# Patient Record
Sex: Male | Born: 1945 | Race: White | Hispanic: No | Marital: Married | Smoking: Former smoker
Health system: Southern US, Community
[De-identification: ages and names within clinical notes are randomized; demographics above are authoritative.]

## PROBLEM LIST (undated history)

## (undated) DIAGNOSIS — F32A Depression, unspecified: Secondary | ICD-10-CM

## (undated) DIAGNOSIS — K219 Gastro-esophageal reflux disease without esophagitis: Secondary | ICD-10-CM

## (undated) DIAGNOSIS — F419 Anxiety disorder, unspecified: Secondary | ICD-10-CM

## (undated) DIAGNOSIS — Z8719 Personal history of other diseases of the digestive system: Secondary | ICD-10-CM

## (undated) DIAGNOSIS — R011 Cardiac murmur, unspecified: Secondary | ICD-10-CM

## (undated) DIAGNOSIS — R0600 Dyspnea, unspecified: Secondary | ICD-10-CM

## (undated) DIAGNOSIS — K5190 Ulcerative colitis, unspecified, without complications: Secondary | ICD-10-CM

## (undated) DIAGNOSIS — M1990 Unspecified osteoarthritis, unspecified site: Secondary | ICD-10-CM

## (undated) DIAGNOSIS — F329 Major depressive disorder, single episode, unspecified: Secondary | ICD-10-CM

## (undated) DIAGNOSIS — N183 Chronic kidney disease, stage 3 unspecified: Secondary | ICD-10-CM

## (undated) DIAGNOSIS — C801 Malignant (primary) neoplasm, unspecified: Secondary | ICD-10-CM

## (undated) DIAGNOSIS — K449 Diaphragmatic hernia without obstruction or gangrene: Secondary | ICD-10-CM

## (undated) DIAGNOSIS — I10 Essential (primary) hypertension: Secondary | ICD-10-CM

## (undated) DIAGNOSIS — J189 Pneumonia, unspecified organism: Secondary | ICD-10-CM

## (undated) HISTORY — PX: OTHER SURGICAL HISTORY: SHX169

## (undated) HISTORY — PX: JOINT REPLACEMENT: SHX530

## (undated) HISTORY — DX: Essential (primary) hypertension: I10

## (undated) HISTORY — DX: Diaphragmatic hernia without obstruction or gangrene: K44.9

## (undated) HISTORY — DX: Ulcerative colitis, unspecified, without complications: K51.90

## (undated) HISTORY — DX: Chronic kidney disease, stage 3 unspecified: N18.30

## (undated) HISTORY — DX: Gastro-esophageal reflux disease without esophagitis: K21.9

---

## 1977-05-12 HISTORY — PX: KNEE ARTHROSCOPY: SUR90

## 1981-05-12 HISTORY — PX: ELBOW BURSA SURGERY: SHX615

## 2014-06-15 ENCOUNTER — Encounter (INDEPENDENT_AMBULATORY_CARE_PROVIDER_SITE_OTHER): Payer: Self-pay | Admitting: *Deleted

## 2014-07-17 ENCOUNTER — Encounter (INDEPENDENT_AMBULATORY_CARE_PROVIDER_SITE_OTHER): Payer: Self-pay | Admitting: Internal Medicine

## 2014-07-17 ENCOUNTER — Ambulatory Visit (INDEPENDENT_AMBULATORY_CARE_PROVIDER_SITE_OTHER): Payer: Medicare Other | Admitting: Internal Medicine

## 2014-07-17 VITALS — BP 138/68 | HR 66 | Temp 98.1°F | Ht 66.0 in | Wt 208.5 lb

## 2014-07-17 DIAGNOSIS — K51919 Ulcerative colitis, unspecified with unspecified complications: Secondary | ICD-10-CM

## 2014-07-17 DIAGNOSIS — K51019 Ulcerative (chronic) pancolitis with unspecified complications: Secondary | ICD-10-CM | POA: Insufficient documentation

## 2014-07-17 DIAGNOSIS — I10 Essential (primary) hypertension: Secondary | ICD-10-CM | POA: Diagnosis not present

## 2014-07-17 DIAGNOSIS — K5100 Ulcerative (chronic) pancolitis without complications: Secondary | ICD-10-CM | POA: Insufficient documentation

## 2014-07-17 DIAGNOSIS — K5190 Ulcerative colitis, unspecified, without complications: Secondary | ICD-10-CM | POA: Insufficient documentation

## 2014-07-17 LAB — CBC WITH DIFFERENTIAL/PLATELET
BASOS ABS: 0 10*3/uL (ref 0.0–0.1)
BASOS PCT: 0 % (ref 0–1)
EOS PCT: 1 % (ref 0–5)
Eosinophils Absolute: 0.1 10*3/uL (ref 0.0–0.7)
HEMATOCRIT: 43.8 % (ref 39.0–52.0)
Hemoglobin: 14.9 g/dL (ref 13.0–17.0)
LYMPHS PCT: 17 % (ref 12–46)
Lymphs Abs: 1.9 10*3/uL (ref 0.7–4.0)
MCH: 28.1 pg (ref 26.0–34.0)
MCHC: 34 g/dL (ref 30.0–36.0)
MCV: 82.6 fL (ref 78.0–100.0)
MPV: 9.8 fL (ref 8.6–12.4)
Monocytes Absolute: 1.1 10*3/uL — ABNORMAL HIGH (ref 0.1–1.0)
Monocytes Relative: 10 % (ref 3–12)
NEUTROS ABS: 7.8 10*3/uL — AB (ref 1.7–7.7)
Neutrophils Relative %: 72 % (ref 43–77)
PLATELETS: 410 10*3/uL — AB (ref 150–400)
RBC: 5.3 MIL/uL (ref 4.22–5.81)
RDW: 15 % (ref 11.5–15.5)
WBC: 10.9 10*3/uL — AB (ref 4.0–10.5)

## 2014-07-17 LAB — COMPREHENSIVE METABOLIC PANEL
ALT: 15 U/L (ref 0–53)
AST: 13 U/L (ref 0–37)
Albumin: 3.9 g/dL (ref 3.5–5.2)
Alkaline Phosphatase: 65 U/L (ref 39–117)
BUN: 17 mg/dL (ref 6–23)
CALCIUM: 9.3 mg/dL (ref 8.4–10.5)
CHLORIDE: 105 meq/L (ref 96–112)
CO2: 24 mEq/L (ref 19–32)
Creat: 1.17 mg/dL (ref 0.50–1.35)
Glucose, Bld: 93 mg/dL (ref 70–99)
POTASSIUM: 3.9 meq/L (ref 3.5–5.3)
SODIUM: 138 meq/L (ref 135–145)
TOTAL PROTEIN: 6.6 g/dL (ref 6.0–8.3)
Total Bilirubin: 0.4 mg/dL (ref 0.2–1.2)

## 2014-07-17 LAB — C-REACTIVE PROTEIN

## 2014-07-17 NOTE — Progress Notes (Signed)
Subjective:    Patient ID: Herbert Morrison, male    DOB: 1946/01/27, 69 y.o.   MRN: [id number]  HPI  Referred to our office by [facility] for UC. Diagnosed in 1995 by Dr. West Carbo.  Last sigmoid was in 2013 with severe active disease. He tells me today he is doing well. He has a BM about three times a day. There is no rectal bleeding. Stools are formed. There is no abdominal pain.  Has been on Uceris for about a year. He has been on multiple other UC medications which did not put him in remission.  Has been on Remicade in past and apparently had a reaction (choking).  Acid reflux controlled with Omeprazole.  No rashes.     07/17/2011 Gastroscopy/Colon/Sig:  Severe chronic active colitis of sigmoid colon consistent with Inflammatory bowel disease. Inflammatory polyp of sigmoid colon. Dysplasia is not seen.  08/03/2009 EGD: erosions, stricture. Dilated with a 62 Malone y bougie. Esopheal reflux.  10/13/2008: Colonoscopy: Follow up UC diagnosed in 1995. Active colitis from rectum to splenic flexure.  The transverse, ascend, and cecum without mucosal abnormalities. Biopsy: Chronic active colitis consistent with inflammatory bowel disease, descending colon, sigmoid colon, and rectal mucosa.  Colonoscopy 01/14/2001: Indications: UC, surveillance. Mild disease of the rectosigmoid ara. Retroflexion revealed normal hemorrhoidal tissue. Moderate diverticulosis of the sigmoid and descending colon are. The descending, transverse, and ascending colon and cecum with patching mild disease. Cecum well visualized.  Review of Systems Past Medical History  Diagnosis Date  . Hypertension   . 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Past Surgical History  Procedure Laterality Date  . Left elbow      spur removed  . Left knee surgery      athroscopy  . Rt knee arthroscopy      Allergies  Allergen Reactions  . Erythromycin     nausea    No current outpatient prescriptions on  file prior to visit.   No current facility-administered medications on file prior to visit.   Outpatient Encounter Prescriptions as of 07/17/2014  Medication Sig  . amLODipine (NORVASC) 10 MG tablet Take 10 mg by mouth daily.  . Budesonide 9 MG TB24 Take by mouth.  . cetirizine (ZYRTEC) 10 MG tablet Take 10 mg by mouth daily.  Marland Kitchen co-enzyme Q-10 30 MG capsule Take 30 mg by mouth 3 (three) times daily.  Marland Kitchen escitalopram (LEXAPRO) 10 MG tablet Take 15 mg by mouth daily.  . metoprolol tartrate (LOPRESSOR) 25 MG tablet Take 25 mg by mouth 2 (two) times daily.  . naproxen sodium (ANAPROX) 220 MG tablet Take 220 mg by mouth 2 (two) times daily with a meal.  . omeprazole (PRILOSEC) 20 MG capsule Take 20 mg by mouth daily.  Married, Two children in good health. Retired Pharmacist, hospital, and is now a Geophysicist/field seismologist.       Objective:   Physical Exam Blood pressure 138/68, pulse 66, temperature 98.1 F (36.7 C), height 5' 6"  (1.676 m), weight 208 lb 8 oz (94.575 kg).  Alert and oriented. Skin warm and dry. Oral mucosa is moist.   . Sclera anicteric, conjunctivae is pink. Thyroid not enlarged. No cervical lymphadenopathy. Lungs clear. Heart regular rate and rhythm.  Abdomen is soft. Bowel sounds are positive. No hepatomegaly. No abdominal masses felt. No tenderness.  No edema to lower extremities.  No rashes       Assessment & Plan:  UC. Hopefully he is in remission. No GI complaints.  Has been on Uceris for about a year. Will need an OV in 6 months with Dr. Laural Golden. May consider colonoscopy at that [place] for surveillance. CBC, CMET, and CRP today.

## 2014-07-17 NOTE — Patient Instructions (Addendum)
CBC, CMET, CRP. OV in 6 months with Dr. Laural Golden.

## 2014-08-02 ENCOUNTER — Encounter (INDEPENDENT_AMBULATORY_CARE_PROVIDER_SITE_OTHER): Payer: Self-pay

## 2014-09-29 ENCOUNTER — Encounter (INDEPENDENT_AMBULATORY_CARE_PROVIDER_SITE_OTHER): Payer: Self-pay

## 2015-01-23 ENCOUNTER — Ambulatory Visit (INDEPENDENT_AMBULATORY_CARE_PROVIDER_SITE_OTHER): Payer: Medicare Other | Admitting: Internal Medicine

## 2015-01-23 ENCOUNTER — Encounter (INDEPENDENT_AMBULATORY_CARE_PROVIDER_SITE_OTHER): Payer: Self-pay | Admitting: Internal Medicine

## 2015-01-23 VITALS — BP 140/84 | HR 66 | Temp 98.6°F | Resp 18 | Ht 66.0 in | Wt 203.7 lb

## 2015-01-23 DIAGNOSIS — K51919 Ulcerative colitis, unspecified with unspecified complications: Secondary | ICD-10-CM

## 2015-01-23 MED ORDER — MESALAMINE ER 0.375 G PO CP24: 1500.0000 mg | ORAL_CAPSULE | Freq: Every day | ORAL | Status: DC

## 2015-01-23 NOTE — Progress Notes (Signed)
Presenting complaint;  Follow-up for ulcerative colitis.  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 He was last seen on 07/17/2014. He states he's been off Uceris for about 3 months. His symptoms have not relapse. He denies abdominal pain or rectal bleeding. He has 3-4 stools per day. His stools are firm. He had blood work by PCP 3 or 4 months ago and was normal. In the past he has been on prednisone as well as his a call with he believes he only took it for 1 month and he did not work. He has not been on any other agent. He states he is not stressed anymore. He was looking after his mother with multiple problems including dementia and finally passed away at age 82. 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 He does not take OTC NSAIDs. Since he said adjustment to his back and knees he is not having pain anymore. Last colonoscopy was in March 2013 by Dr. Barnabas Lister Spanhour revealing active disease to splenic flexure.    Current Medications: Outpatient Encounter Prescriptions as of 01/23/2015  Medication Sig  . amLODipine (NORVASC) 10 MG tablet Take 10 mg by mouth daily.  . Budesonide 9 MG TB24 Take by mouth.  . cetirizine (ZYRTEC) 10 MG tablet Take 10 mg by mouth daily.  . chlorthalidone (HYGROTON) 25 MG tablet Take 25 mg by mouth daily. Patient takes 1/2 tablet daily.  Marland Kitchen escitalopram (LEXAPRO) 10 MG tablet Take 15 mg by mouth daily.  . fluticasone (FLONASE) 50 MCG/ACT nasal spray Place 1 spray into both nostrils daily.  . metoprolol tartrate (LOPRESSOR) 25 MG tablet Take 25 mg by mouth 2 (two) times daily.  . naproxen sodium (ANAPROX) 220 MG tablet Take 220 mg by mouth 2 (two) times daily with a meal.  . omeprazole (PRILOSEC) 20 MG capsule Take 20 mg by mouth daily.  . valsartan (DIOVAN) 320 MG tablet Take 320 mg by mouth daily.   . [DISCONTINUED] co-enzyme Q-10 30 MG capsule Take 30 mg by  mouth 3 (three) times daily.   No facility-administered encounter medications on file as of 01/23/2015.     Objective: Blood pressure 140/84, pulse 66, temperature 98.6 F (37 C), temperature source Oral, resp. rate 18, height 5' 6"  (1.676 m), weight 203 lb 11.2 oz (92.398 kg).  Patient is alert and in no acute distress. Conjunctiva is pink. Sclera is nonicteric Oropharyngeal mucosa is normal. No neck masses or thyromegaly noted. Cardiac exam with regular rhythm normal S1 and S2. No murmur or gallop noted. Lungs are clear to auscultation. Abdomen is soft and nontender without organomegaly or masses. No LE edema or clubbing noted.  Labs/studies Results: Lab data from 07/17/2014  WBC 10.9, H&H 14.9 and 43.8 and platelet count 410 K  CRP was less than 0.5    Assessment:  #1. Distal ulcerative colitis. He appears to be in remission based on his symptoms. Since he is at fairly active disease since onset in 1995 he would be better off on maintenance therapy with mesalamine which might work and he will have to take it for at least 3-6 months.   Plan:  Begin Apriso at 1500 mgs po qd. Patient will call if he has side effect with oral mesalamine or if rectal bleeding recurs. Office visit in 6 months.

## 2015-01-23 NOTE — Patient Instructions (Signed)
Call if you experience any side effects with Apriso

## 2015-01-26 ENCOUNTER — Telehealth (INDEPENDENT_AMBULATORY_CARE_PROVIDER_SITE_OTHER): Payer: Self-pay | Admitting: *Deleted

## 2015-01-26 NOTE — Telephone Encounter (Signed)
Herbert Morrison said he was started on Apriso and it doesn't work. It made him very ill. His return phone number is [phone].

## 2015-01-30 NOTE — Telephone Encounter (Signed)
Says he became nauseated.Stopped. After stopping, he felt better.

## 2015-01-30 NOTE — Telephone Encounter (Signed)
He will start the Cerise if he has a flare.

## 2015-01-30 NOTE — Telephone Encounter (Signed)
I have sent coupons for Uceris to patient.

## 2015-07-24 ENCOUNTER — Encounter (INDEPENDENT_AMBULATORY_CARE_PROVIDER_SITE_OTHER): Payer: Self-pay | Admitting: Internal Medicine

## 2015-07-24 ENCOUNTER — Ambulatory Visit (INDEPENDENT_AMBULATORY_CARE_PROVIDER_SITE_OTHER): Payer: Medicare Other | Admitting: Internal Medicine

## 2015-07-24 VITALS — BP 162/80 | HR 64 | Temp 97.8°F | Ht 66.5 in | Wt 208.5 lb

## 2015-07-24 DIAGNOSIS — K5120 Ulcerative (chronic) proctitis without complications: Secondary | ICD-10-CM | POA: Diagnosis not present

## 2015-07-24 LAB — CBC WITH DIFFERENTIAL/PLATELET
BASOS ABS: 0.1 10*3/uL (ref 0.0–0.1)
BASOS PCT: 1 % (ref 0–1)
Eosinophils Absolute: 0.3 10*3/uL (ref 0.0–0.7)
Eosinophils Relative: 5 % (ref 0–5)
HEMATOCRIT: 45.1 % (ref 39.0–52.0)
HEMOGLOBIN: 15.2 g/dL (ref 13.0–17.0)
LYMPHS PCT: 24 % (ref 12–46)
Lymphs Abs: 1.6 10*3/uL (ref 0.7–4.0)
MCH: 27.6 pg (ref 26.0–34.0)
MCHC: 33.7 g/dL (ref 30.0–36.0)
MCV: 82 fL (ref 78.0–100.0)
MPV: 9.3 fL (ref 8.6–12.4)
Monocytes Absolute: 0.6 10*3/uL (ref 0.1–1.0)
Monocytes Relative: 9 % (ref 3–12)
NEUTROS ABS: 4.1 10*3/uL (ref 1.7–7.7)
Neutrophils Relative %: 61 % (ref 43–77)
Platelets: 374 10*3/uL (ref 150–400)
RBC: 5.5 MIL/uL (ref 4.22–5.81)
RDW: 15.2 % (ref 11.5–15.5)
WBC: 6.8 10*3/uL (ref 4.0–10.5)

## 2015-07-24 LAB — C-REACTIVE PROTEIN: CRP: 0.6 mg/dL — AB (ref <0.60)

## 2015-07-24 NOTE — Progress Notes (Signed)
   Subjective:    Patient ID: Herbert Morrison, male    DOB: [birth year], 70 y.o.   MRN: [id number]  HPI Here today for f/u of his UC. He was last seen by Dr. Laural Golden in September. He tells me he is doing good. Off UC medication at present.  He tells me the Uceris is 900.00 dollars a month and he cannot afford it. He says it works. He is having 3-4 BMs a day. Appetite is good. He has gained 5 pounds since his last visit. No abdominal pain. No dysphagia. Acid reflux is controlled with BID Prilosec. He has no GI complaints    . Last colonoscopy was in March 2013 by Dr. Barnabas Lister Spanhour revealing active disease to splenic flexure.       Review of Systems Past Medical History  Diagnosis Date  . Hypertension   . 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[facility])     Past Surgical History  Procedure Laterality Date  . Left elbow      spur removed  . Left knee surgery      athroscopy  . Rt knee arthroscopy      Allergies  Allergen Reactions  . Erythromycin     nausea    Current Outpatient Prescriptions on File Prior to Visit  Medication Sig Dispense Refill  . amLODipine (NORVASC) 10 MG tablet Take 10 mg by mouth daily.    . cetirizine (ZYRTEC) 10 MG tablet Take 10 mg by mouth daily.    Marland Kitchen escitalopram (LEXAPRO) 10 MG tablet Take 15 mg by mouth daily.    . fluticasone (FLONASE) 50 MCG/ACT nasal spray Place 1 spray into both nostrils daily.    . metoprolol tartrate (LOPRESSOR) 25 MG tablet Take 25 mg by mouth 2 (two) times daily.    . naproxen sodium (ANAPROX) 220 MG tablet Take 220 mg by mouth 2 (two) times daily with a meal.    . omeprazole (PRILOSEC) 20 MG capsule Take 20 mg by mouth 2 (two) times daily before a meal.     . valsartan (DIOVAN) 320 MG tablet Take 320 mg by mouth daily.     . Budesonide 9 MG TB24 Take by mouth. Reported on 07/24/2015     No current facility-administered medications on file prior to visit.        Objective:   Physical ExamBlood pressure 162/80, pulse 64, temperature  97.8 F (36.6 C), height 5' 6.5" (1.689 m), weight 208 lb 8 oz (94.575 kg). Alert and oriented. Skin warm and dry. Oral mucosa is moist.   . Sclera anicteric, conjunctivae is pink. Thyroid not enlarged. No cervical lymphadenopathy. Lungs clear. Heart regular rate and rhythm.  Abdomen is soft. Bowel sounds are positive. No hepatomegaly. No abdominal masses felt. No tenderness.  No edema to lower extremities.          Assessment & Plan:  UC. He seems to be in remission;. He is doing well. No GI complaints. He is not taking Uceris due to the expense. He will call me if he has a flare. OV in 6 months.  Will schedule a colonoscopy at next OV.

## 2015-07-24 NOTE — Patient Instructions (Signed)
OV in 6 months

## 2016-01-29 ENCOUNTER — Other Ambulatory Visit (INDEPENDENT_AMBULATORY_CARE_PROVIDER_SITE_OTHER): Payer: Self-pay | Admitting: Internal Medicine

## 2016-01-29 ENCOUNTER — Encounter (INDEPENDENT_AMBULATORY_CARE_PROVIDER_SITE_OTHER): Payer: Self-pay | Admitting: *Deleted

## 2016-01-29 ENCOUNTER — Telehealth (INDEPENDENT_AMBULATORY_CARE_PROVIDER_SITE_OTHER): Payer: Self-pay | Admitting: *Deleted

## 2016-01-29 ENCOUNTER — Encounter (INDEPENDENT_AMBULATORY_CARE_PROVIDER_SITE_OTHER): Payer: Self-pay | Admitting: Internal Medicine

## 2016-01-29 ENCOUNTER — Ambulatory Visit (INDEPENDENT_AMBULATORY_CARE_PROVIDER_SITE_OTHER): Payer: Medicare Other | Admitting: Internal Medicine

## 2016-01-29 DIAGNOSIS — K5120 Ulcerative (chronic) proctitis without complications: Secondary | ICD-10-CM | POA: Insufficient documentation

## 2016-01-29 DIAGNOSIS — K219 Gastro-esophageal reflux disease without esophagitis: Secondary | ICD-10-CM | POA: Insufficient documentation

## 2016-01-29 LAB — CBC WITH DIFFERENTIAL/PLATELET
BASOS ABS: 69 {cells}/uL (ref 0–200)
Basophils Relative: 1 %
EOS PCT: 4 %
Eosinophils Absolute: 276 cells/uL (ref 15–500)
HEMATOCRIT: 46.6 % (ref 38.5–50.0)
HEMOGLOBIN: 15.3 g/dL (ref 13.2–17.1)
LYMPHS ABS: 1587 {cells}/uL (ref 850–3900)
LYMPHS PCT: 23 %
MCH: 26.5 pg — ABNORMAL LOW (ref 27.0–33.0)
MCHC: 32.8 g/dL (ref 32.0–36.0)
MCV: 80.6 fL (ref 80.0–100.0)
MPV: 9.9 fL (ref 7.5–12.5)
Monocytes Absolute: 759 cells/uL (ref 200–950)
Monocytes Relative: 11 %
NEUTROS PCT: 61 %
Neutro Abs: 4209 cells/uL (ref 1500–7800)
Platelets: 313 10*3/uL (ref 140–400)
RBC: 5.78 MIL/uL (ref 4.20–5.80)
RDW: 15.9 % — AB (ref 11.0–15.0)
WBC: 6.9 10*3/uL (ref 3.8–10.8)

## 2016-01-29 MED ORDER — PEG 3350-KCL-NA BICARB-NACL 420 G PO SOLR
4000.0000 mL | Freq: Once | ORAL | 0 refills | Status: AC
Start: 2016-01-29 — End: 2016-01-29

## 2016-01-29 NOTE — Patient Instructions (Signed)
Colonoscopy.  The risks and benefits such as perforation, bleeding, and infection were reviewed with the patient and is agreeable. 

## 2016-01-29 NOTE — Telephone Encounter (Signed)
Patient needs trilyte 

## 2016-01-29 NOTE — Progress Notes (Signed)
   Subjective:    Patient ID: Herbert Morrison, male    DOB: 01/13/1946, 70 y.o.   MRN: [id number]  HPI Here today for f/u of his UC. He was last seen in March of this by me. He tells me he is doing good. He his having 2-3 stools a day. Stools are formed. No mucous or blood. There is no rectal or abdominal pain.  Appetite is good. No weight loss. He has gained about 4.5 pounds since his last visit.   Last sigmoidoscopy. was in March 2013 by Dr. Barnabas Lister Spanhour revealing active disease to splenic flexure.  Last colonoscopy in 2010 with active disease from rectum to splenic flexure.  CBC    Component Value Date/Time   WBC 6.8 07/24/2015 0841   RBC 5.50 07/24/2015 0841   HGB 15.2 07/24/2015 0841   HCT 45.1 07/24/2015 0841   PLT 374 07/24/2015 0841   MCV 82.0 07/24/2015 0841   MCH 27.6 07/24/2015 0841   MCHC 33.7 07/24/2015 0841   RDW 15.2 07/24/2015 0841   LYMPHSABS 1.6 07/24/2015 0841   MONOABS 0.6 07/24/2015 0841   EOSABS 0.3 07/24/2015 0841   BASOSABS 0.1 07/24/2015 0841   07/25/2015: C-reactive protein 0.6        .   Review of Systems Past Medical History:  Diagnosis Date  . GERD (gastroesophageal reflux disease)   . Hypertension   . 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[facility])     Past Surgical History:  Procedure Laterality Date  . left elbow     spur removed  . left knee surgery     athroscopy  . rt knee arthroscopy      Allergies  Allergen Reactions  . Erythromycin     nausea    Current Outpatient Prescriptions on File Prior to Visit  Medication Sig Dispense Refill  . amLODipine (NORVASC) 10 MG tablet Take 10 mg by mouth daily.    . Budesonide 9 MG TB24 Take by mouth. Reported on 07/24/2015    . cetirizine (ZYRTEC) 10 MG tablet Take 10 mg by mouth daily.    Marland Kitchen escitalopram (LEXAPRO) 10 MG tablet Take 15 mg by mouth daily.    . fluticasone (FLONASE) 50 MCG/ACT nasal spray Place 1 spray into both nostrils daily.    . metoprolol tartrate (LOPRESSOR) 25 MG tablet Take 25  mg by mouth 2 (two) times daily.    Marland Kitchen omeprazole (PRILOSEC) 20 MG capsule Take 20 mg by mouth 2 (two) times daily before a meal.     . valsartan (DIOVAN) 320 MG tablet Take 320 mg by mouth daily.      No current facility-administered medications on file prior to visit.        Objective:   Physical Exam Blood pressure (!) 160/96, pulse 64, temperature 97.7 F (36.5 C), height 5' 6.5" (1.689 m), weight 212 lb 6.4 oz (96.3 kg).  Alert and oriented. Skin warm and dry. Oral mucosa is moist.   . Sclera anicteric, conjunctivae is pink. Thyroid not enlarged. No cervical lymphadenopathy. Lungs clear. Heart regular rate and rhythm.  Abdomen is soft. Bowel sounds are positive. No hepatomegaly. No abdominal masses felt. No tenderness.  No edema to lower extremities.        Assessment & Plan:  UC. No GI complaints. Needs surveillance colonoscopy for his UC. CBC and CRP today.  The risks and benefits such as perforation, bleeding, and infection were reviewed with the patient and is agreeable.

## 2016-01-30 LAB — C-REACTIVE PROTEIN: CRP: 6.6 mg/L (ref <8.0)

## 2016-04-09 ENCOUNTER — Ambulatory Visit (HOSPITAL_COMMUNITY)
Admission: RE | Admit: 2016-04-09 | Discharge: 2016-04-09 | Disposition: A | Discharge status: Home / Self Care | Payer: Medicare Other | Source: Ambulatory Visit | Attending: Internal Medicine | Admitting: Internal Medicine

## 2016-04-09 ENCOUNTER — Encounter (HOSPITAL_COMMUNITY): Payer: Self-pay | Admitting: *Deleted

## 2016-04-09 ENCOUNTER — Encounter (HOSPITAL_COMMUNITY)
Admission: RE | Disposition: A | Discharge status: Home / Self Care | Payer: Self-pay | Source: Ambulatory Visit | Attending: Internal Medicine

## 2016-04-09 DIAGNOSIS — K5730 Diverticulosis of large intestine without perforation or abscess without bleeding: Secondary | ICD-10-CM | POA: Diagnosis not present

## 2016-04-09 DIAGNOSIS — Z8719 Personal history of other diseases of the digestive system: Secondary | ICD-10-CM | POA: Insufficient documentation

## 2016-04-09 DIAGNOSIS — Z791 Long term (current) use of non-steroidal anti-inflammatories (NSAID): Secondary | ICD-10-CM | POA: Diagnosis not present

## 2016-04-09 DIAGNOSIS — Z1211 Encounter for screening for malignant neoplasm of colon: Secondary | ICD-10-CM | POA: Diagnosis not present

## 2016-04-09 DIAGNOSIS — K6289 Other specified diseases of anus and rectum: Secondary | ICD-10-CM | POA: Diagnosis not present

## 2016-04-09 DIAGNOSIS — Z79899 Other long term (current) drug therapy: Secondary | ICD-10-CM | POA: Diagnosis not present

## 2016-04-09 DIAGNOSIS — Z7951 Long term (current) use of inhaled steroids: Secondary | ICD-10-CM | POA: Insufficient documentation

## 2016-04-09 DIAGNOSIS — K635 Polyp of colon: Secondary | ICD-10-CM | POA: Diagnosis not present

## 2016-04-09 DIAGNOSIS — D120 Benign neoplasm of cecum: Secondary | ICD-10-CM | POA: Diagnosis not present

## 2016-04-09 DIAGNOSIS — K5120 Ulcerative (chronic) proctitis without complications: Secondary | ICD-10-CM | POA: Diagnosis not present

## 2016-04-09 DIAGNOSIS — I10 Essential (primary) hypertension: Secondary | ICD-10-CM | POA: Insufficient documentation

## 2016-04-09 DIAGNOSIS — K219 Gastro-esophageal reflux disease without esophagitis: Secondary | ICD-10-CM | POA: Diagnosis not present

## 2016-04-09 DIAGNOSIS — Z87891 Personal history of nicotine dependence: Secondary | ICD-10-CM | POA: Diagnosis not present

## 2016-04-09 DIAGNOSIS — K6389 Other specified diseases of intestine: Secondary | ICD-10-CM | POA: Diagnosis not present

## 2016-04-09 HISTORY — PX: POLYPECTOMY: SHX5525

## 2016-04-09 HISTORY — PX: COLONOSCOPY: SHX5424

## 2016-04-09 SURGERY — COLONOSCOPY
Anesthesia: Moderate Sedation

## 2016-04-09 MED ORDER — MIDAZOLAM HCL 5 MG/5ML IJ SOLN
INTRAMUSCULAR | Status: DC | PRN
  Administered 2016-04-09 (×2): 2 mg via INTRAVENOUS
  Administered 2016-04-09: 1 mg via INTRAVENOUS
  Administered 2016-04-09: 2 mg via INTRAVENOUS

## 2016-04-09 MED ORDER — MEPERIDINE HCL 50 MG/ML IJ SOLN
INTRAMUSCULAR | Status: AC
  Filled 2016-04-09: qty 1

## 2016-04-09 MED ORDER — MIDAZOLAM HCL 5 MG/5ML IJ SOLN
INTRAMUSCULAR | Status: AC
  Filled 2016-04-09: qty 10

## 2016-04-09 MED ORDER — STERILE WATER FOR IRRIGATION IR SOLN
Status: DC | PRN
  Administered 2016-04-09: 2.5 mL

## 2016-04-09 MED ORDER — MEPERIDINE HCL 50 MG/ML IJ SOLN
INTRAMUSCULAR | Status: DC | PRN
  Administered 2016-04-09 (×2): 25 mg via INTRAVENOUS

## 2016-04-09 MED ORDER — SODIUM CHLORIDE 0.9 % IV SOLN
INTRAVENOUS | Status: DC
  Administered 2016-04-09: 1000 mL via INTRAVENOUS

## 2016-04-09 NOTE — H&P (Addendum)
Herbert Morrison is an 70 y.o. male.   Chief Complaint: Patient is here for colonoscopy. HPI: Patient is 70 year old Caucasian male was over 22 year history of ulcerative colitis was here for surveillance colonoscopy. Last exam was in 2013 he was noted to have active disease. Presently he is on no therapy for maintenance. He is having formed stools. He denies abdominal pain or rectal bleeding. He has one second cousin with Crohn disease.  Past Medical History:  Diagnosis Date  . GERD (gastroesophageal reflux disease)   . Hypertension   . 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[facility])     Past Surgical History:  Procedure Laterality Date  . left elbow     spur removed  . left knee surgery     athroscopy  . rt knee arthroscopy      No family history on file. Social History:  reports that he has quit smoking. He quit smokeless tobacco use about 51 years ago. His smokeless tobacco use included Chew. He reports that he drinks alcohol. He reports that he does not use drugs.  Allergies:  Allergies  Allergen Reactions  . Erythromycin     nausea    Medications Prior to Admission  Medication Sig Dispense Refill  . amLODipine (NORVASC) 10 MG tablet Take 10 mg by mouth daily.    . cholecalciferol (VITAMIN D) 400 units TABS tablet Take 400 Units by mouth.    . metoprolol tartrate (LOPRESSOR) 25 MG tablet Take 25 mg by mouth 2 (two) times daily.    . vitamin B-12 (CYANOCOBALAMIN) 1000 MCG tablet Take 1,000 mcg by mouth daily.    Marland Kitchen zinc gluconate 50 MG tablet Take 50 mg by mouth daily.    . Budesonide 9 MG TB24 Take by mouth. Reported on 07/24/2015    . buPROPion (WELLBUTRIN) 100 MG tablet Take by mouth 2 (two) times daily. Patient did not know dose.    . celecoxib (CELEBREX) 200 MG capsule Take 200 mg by mouth daily.    . cetirizine (ZYRTEC) 10 MG tablet Take 10 mg by mouth daily.    Marland Kitchen escitalopram (LEXAPRO) 10 MG tablet Take 15 mg by mouth daily.    . fluticasone (FLONASE) 50 MCG/ACT nasal spray Place 1  spray into both nostrils daily.    Marland Kitchen omeprazole (PRILOSEC) 20 MG capsule Take 20 mg by mouth 2 (two) times daily before a meal.     . valsartan (DIOVAN) 320 MG tablet Take 320 mg by mouth daily.       No results found for this or any previous visit (from the past 48 hour(s)). No results found.  Review of Systems  Eyes: Positive for discharge.    Blood pressure (!) 158/91, pulse 80, temperature 98.1 F (36.7 C), temperature source Oral, resp. rate 14, height 5' 6.5" (1.689 m), weight 214 lb (97.1 kg), SpO2 96 %. Physical Exam  Constitutional: He appears well-developed and well-nourished.  HENT:  Mouth/Throat: Oropharynx is clear and moist.  Eyes: Conjunctivae are normal. No scleral icterus.  Neck: No thyromegaly present.  Cardiovascular: Normal rate, regular rhythm and normal heart sounds.   No murmur heard. Respiratory: Effort normal and breath sounds normal.  GI:  Abdomen is full with small umbilical hernia. Abdomen is soft and nontender without organomegaly or masses.  Musculoskeletal: He exhibits no edema.  Lymphadenopathy:    He has no cervical adenopathy.  Neurological: He is alert.  Skin: Skin is warm and dry.     Assessment/Plan Chronic ulcerative colitis in remission. Surveillance  colonoscopy  Herbert Laser, MD 04/09/2016, 1:48 PM

## 2016-04-09 NOTE — Op Note (Signed)
[facility] Patient Name: Herbert Morrison Procedure Date: 04/09/2016 7:28 AM MRN: [id number] Date of Birth: 11/10/1945 Attending MD: Hildred Laser , MD CSN: [id number] Age: 70 Admit Type: Outpatient Procedure:                Colonoscopy Indications:              High risk colon cancer surveillance: Ulcerative                            left sided colitis of 15 (or more) years duration Providers:                Hildred Laser, MD, Lurline Del, RN, Purcell Nails. [place],                            Technician Referring MD:             Alanda Amass, MD Medicines:                Meperidine 50 mg IV, Midazolam 8 mg IV Complications:            No immediate complications. Estimated Blood Loss:     Estimated blood loss was minimal. Procedure:                Pre-Anesthesia Assessment:                           - Prior to the procedure, a History and Physical                            was performed, and patient medications and                            allergies were reviewed. The patient's tolerance of                            previous anesthesia was also reviewed. The risks                            and benefits of the procedure and the sedation                            options and risks were discussed with the patient.                            All questions were answered, and informed consent                            was obtained. Prior Anticoagulants: The patient                            last took previous NSAID medication 1 day prior to                            the procedure. ASA Grade Assessment: II - A patient  with mild systemic disease. After reviewing the                            risks and benefits, the patient was deemed in                            satisfactory condition to undergo the procedure.                           After obtaining informed consent, the colonoscope                            was passed under direct vision. Throughout  the                            procedure, the patient's blood pressure, pulse, and                            oxygen saturations were monitored continuously. The                            EC-[id number] ([id number]) was introduced through the                            anus and advanced to the the cecum, identified by                            appendiceal orifice and ileocecal valve. The                            colonoscopy was performed without difficulty. The                            patient tolerated the procedure well. The quality                            of the bowel preparation was adequate. The                            ileocecal valve, appendiceal orifice, and rectum                            were photographed. Scope In: 1:58:28 PM Scope Out: 2:20:06 PM Scope Withdrawal Time: 0 hours 12 minutes 12 seconds  Total Procedure Duration: 0 hours 21 minutes 38 seconds  Findings:      The perianal and digital rectal examinations were normal.      A small polyp was found in the cecum. The polyp was sessile. The polyp       was removed with a cold snare. Resection was complete, but the polyp       tissue was not retrieved.      Normal mucosa was found in the entire colon.      A scar was found in the rectum and in the sigmoid colon. The scar tissue  was healthy in appearance.      Scattered medium-mouthed diverticula were found in the sigmoid colon.      The retroflexed view of the distal rectum and anal verge was normal and       showed no anal or rectal abnormalities. Impression:               - One small polyp in the cecum, removed with a cold                            snare. Complete resection. Polyp tissue not                            retrieved.                           - Normal mucosa in the entire examined colon.                           - Scar in the rectum and in the sigmoid colon.                           - Diverticulosis in the sigmoid colon.                            - The distal rectum and anal verge are normal on                            retroflexion view. Moderate Sedation:      Moderate (conscious) sedation was administered by the endoscopy nurse       and supervised by the endoscopist. The following parameters were       monitored: oxygen saturation, heart rate, blood pressure, CO2       capnography and response to care. Total physician intraservice time was       27 minutes. Recommendation:           - Patient has a contact number available for                            emergencies. The signs and symptoms of potential                            delayed complications were discussed with the                            patient. Return to normal activities tomorrow.                            Written discharge instructions were provided to the                            patient.                           - Resume previous diet today.                           -  Continue present medications.                           - Repeat colonoscopy in 5 years for surveillance. Procedure Code(s):        --- Professional ---                           [phone], Colonoscopy, flexible; with removal of                            tumor(s), polyp(s), or other lesion(s) by snare                            technique                           99152, Moderate sedation services provided by the                            same physician or other qualified health care                            professional performing the diagnostic or                            therapeutic service that the sedation supports,                            requiring the presence of an independent trained                            observer to assist in the monitoring of the                            patient's level of consciousness and physiological                            status; initial 15 minutes of intraservice time,                            patient age 67 years or older                            [phone], Moderate sedation services; each additional                            15 minutes intraservice time Diagnosis Code(s):        --- Professional ---                           K51.50, Left sided colitis without complications                           D12.0, Benign neoplasm of cecum  K62.89, Other specified diseases of anus and rectum                           K63.89, Other specified diseases of intestine                           K57.30, Diverticulosis of large intestine without                            perforation or abscess without bleeding CPT copyright 2016 [employer] Association. All rights reserved. The codes documented in this report are preliminary and upon coder review may  be revised to meet current compliance requirements. Hildred Laser, MD Hildred Laser, MD 04/09/2016 2:28:56 PM This report has been signed electronically. Number of Addenda: 0

## 2016-04-09 NOTE — Discharge Instructions (Signed)
° °  Colonoscopy, Adult, Care After This sheet gives you information about how to care for yourself after your procedure. Your health care provider may also give you more specific instructions. If you have problems or questions, contact your health care provider. What can I expect after the procedure? After the procedure, it is common to have:  A small amount of blood in your stool for 24 hours after the procedure.  Some gas.  Mild abdominal cramping or bloating. Follow these instructions at home: General instructions  For the first 24 hours after the procedure:  Do not drive or use machinery.  Do not sign important documents.  Do not drink alcohol.  Do your regular daily activities at a slower pace than normal.  Eat soft, easy-to-digest foods.  Rest often.  Take over-the-counter or prescription medicines only as told by your health care provider.  It is up to you to get the results of your procedure. Ask your health care provider, or the department performing the procedure, when your results will be ready. Relieving cramping and bloating  Try walking around when you have cramps or feel bloated.  Apply heat to your abdomen as told by your health care provider. Use a heat source that your health care provider recommends, such as a moist heat pack or a heating pad.  Place a towel between your skin and the heat source.  Leave the heat on for 20-30 minutes.  Remove the heat if your skin turns bright red. This is especially important if you are unable to feel pain, heat, or cold. You may have a greater risk of getting burned. Eating and drinking  Drink enough fluid to keep your urine clear or pale yellow.  Resume your normal diet as instructed by your health care provider. Avoid heavy or fried foods that are hard to digest.  Avoid drinking alcohol for as long as instructed by your health care provider. Contact a health care provider if:  You have blood in your stool 2-3  days after the procedure. Get help right away if:  You have more than a small spotting of blood in your stool.  You pass large blood clots in your stool.  Your abdomen is swollen.  You have nausea or vomiting.  You have a fever.  You have increasing abdominal pain that is not relieved with medicine. This information is not intended to replace advice given to you by your health care provider. Make sure you discuss any questions you have with your health care provider. Document Released: 12/11/2003 Document Revised: 01/21/2016 Document Reviewed: 07/10/2015 Elsevier Interactive Patient Education  2017 [place] usual medications and diet. Consider using Celebrex on as-needed basis if possible. No driving for 24 hours. Office visit in one year. Call if you experience rectal bleeding or diarrhea.

## 2016-04-14 ENCOUNTER — Encounter (HOSPITAL_COMMUNITY): Payer: Self-pay | Admitting: Internal Medicine

## 2016-07-28 ENCOUNTER — Encounter (INDEPENDENT_AMBULATORY_CARE_PROVIDER_SITE_OTHER): Payer: Self-pay

## 2016-07-28 ENCOUNTER — Ambulatory Visit (INDEPENDENT_AMBULATORY_CARE_PROVIDER_SITE_OTHER): Payer: Medicare Other | Admitting: Internal Medicine

## 2016-07-28 ENCOUNTER — Encounter (INDEPENDENT_AMBULATORY_CARE_PROVIDER_SITE_OTHER): Payer: Self-pay | Admitting: Internal Medicine

## 2016-07-28 VITALS — BP 164/84 | HR 60 | Temp 97.6°F | Ht 66.5 in | Wt 219.6 lb

## 2016-07-28 DIAGNOSIS — K5120 Ulcerative (chronic) proctitis without complications: Secondary | ICD-10-CM

## 2016-07-28 NOTE — Progress Notes (Signed)
Subjective:    Patient ID: Herbert Morrison, male    DOB: [birth year], 71 y.o.   MRN: [id number] PCP Dr. Alanda Amass HPI Here today for f/u. Hx of UC since [phone]. He says he is having pain in his left hip and leg. Hasn't been able to get out much due to the pain.  He says he is doing good as far as his GI. He has a  BM 2-3 times a day.  States he had blood work recently and I will get that report. Appetite is good. He has gained about 11 pounds since his last visit. Not maintained on medication for his UC due to the expense.   01/29/2016 CRP 6/6 CBC    Component Value Date/Time   WBC 6.9 01/29/2016 0859   RBC 5.78 01/29/2016 0859   HGB 15.3 01/29/2016 0859   HCT 46.6 01/29/2016 0859   PLT 313 01/29/2016 0859   MCV 80.6 01/29/2016 0859   MCH 26.5 (L) 01/29/2016 0859   MCHC 32.8 01/29/2016 0859   RDW 15.9 (H) 01/29/2016 0859   LYMPHSABS 1,587 01/29/2016 0859   MONOABS 759 01/29/2016 0859   EOSABS 276 01/29/2016 0859   BASOSABS 69 01/29/2016 0859     He underwent an Colonoscopy 04/09/2016 for hx of UC.              Impression: One small polyp in the cecum, removed with a cold                            snare. Complete resection. Polyp tissue not                            retrieved.                           - Normal mucosa in the entire examined colon.                           - Scar in the rectum and in the sigmoid colon.                           - Diverticulosis in the sigmoid colon.                           - The distal rectum and anal verge are normal on                            retroflexion view.   Review of Systems Past Medical History:  Diagnosis Date  . GERD (gastroesophageal reflux disease)   . Hypertension   . 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[facility])     Past Surgical History:  Procedure Laterality Date  . COLONOSCOPY N/A 04/09/2016   Procedure: COLONOSCOPY;  Surgeon: Rogene Houston, MD;  Location: [facility]O [facility];  Service: Endoscopy;  Laterality: N/A;   1:45  . left elbow     spur removed  . left knee surgery     athroscopy  . POLYPECTOMY  04/09/2016   Procedure: POLYPECTOMY;  Surgeon: Rogene Houston, MD;  Location: [facility];  Service: Endoscopy;;  cecal polyp  . rt knee arthroscopy      Allergies  Allergen  Reactions  . Erythromycin     nausea    Current Outpatient Prescriptions on File Prior to Visit  Medication Sig Dispense Refill  . amLODipine (NORVASC) 10 MG tablet Take 10 mg by mouth daily.    Marland Kitchen buPROPion (WELLBUTRIN) 100 MG tablet Take by mouth 2 (two) times daily. Patient did not know dose.    . celecoxib (CELEBREX) 200 MG capsule Take 200 mg by mouth daily.    . cetirizine (ZYRTEC) 10 MG tablet Take 10 mg by mouth daily.    . cholecalciferol (VITAMIN D) 400 units TABS tablet Take 1,000 Units by mouth.     . escitalopram (LEXAPRO) 10 MG tablet Take 15 mg by mouth daily.    . fluticasone (FLONASE) 50 MCG/ACT nasal spray Place 1 spray into both nostrils daily.    Marland Kitchen omeprazole (PRILOSEC) 20 MG capsule Take 20 mg by mouth 2 (two) times daily before a meal.     . vitamin B-12 (CYANOCOBALAMIN) 1000 MCG tablet Take 2,500 mcg by mouth daily.     Marland Kitchen zinc gluconate 50 MG tablet Take 50 mg by mouth daily.     No current facility-administered medications on file prior to visit.        Objective:   Physical Exam Blood pressure (!) 164/84, pulse 60, temperature 97.6 F (36.4 C), height 5' 6.5" (1.689 m), weight 219 lb 9.6 oz (99.6 kg). Alert and oriented. Skin warm and dry. Oral mucosa is moist.   . Sclera anicteric, conjunctivae is pink. Thyroid not enlarged. No cervical lymphadenopathy. Lungs clear. Heart regular rate and rhythm.  Abdomen is soft. Bowel sounds are positive. No hepatomegaly. No abdominal masses felt. No tenderness.  No edema to lower extremities.          Assessment & Plan:  UC. Will see back in 1 years. Will get labs from PCP

## 2016-07-28 NOTE — Patient Instructions (Signed)
OV in 1 year.  

## 2016-09-04 ENCOUNTER — Encounter (INDEPENDENT_AMBULATORY_CARE_PROVIDER_SITE_OTHER): Payer: Self-pay

## 2017-03-13 NOTE — Patient Instructions (Addendum)
Zohair Epp  03/13/2017   Your procedure is scheduled on: 03/24/17  Report to [facility] Main  Entrance   Report to admitting at     1150AM   Call this number if you have problems the morning of surgery  [phone]   Remember: ONLY 1 PERSON MAY GO WITH YOU TO SHORT STAY TO GET  READY MORNING OF YOUR SURGERY.   Do not eat food  :After Midnight. You may have clear liquids until 0820 then nothing by mouth     Take these medicines the morning of surgery with A SIP OF WATER: omeprazole, lexapro, zyrtec, amlodipine                                You may not have any metal on your body including hair pins and              piercings  Do not wear jewelry, lotions, powders or perfumes, deodorant                       Men may shave face and neck.   Do not bring valuables to the hospital. [place].  Contacts, dentures or bridgework may not be worn into surgery.  Leave suitcase in the car. After surgery it may be brought to your room.     Patients discharged the day of surgery will not be allowed to drive home.  Name and phone number of your driver:  Special Instructions: N/A              Please read over the following fact sheets you were given: _____________________________________________________________________     CLEAR LIQUID DIET   Foods Allowed                                                                     Foods Excluded  Coffee and tea, regular and decaf                             liquids that you cannot  Plain Jell-O in any flavor                                             see through such as: Fruit ices (not with fruit pulp)                                     milk, soups, orange juice  Iced Popsicles                                    All solid food Carbonated beverages, regular and diet  Cranberry, grape and apple juices Sports drinks like  Gatorade Lightly seasoned clear broth or consume(fat free) Sugar, honey syrup  Sample Menu Breakfast                                Lunch                                     Supper Cranberry juice                    Beef broth                            Chicken broth Jell-O                                     Grape juice                           Apple juice Coffee or tea                        Jell-O                                      Popsicle                                                Coffee or tea                        Coffee or tea  _____________________________________________________________________             [facility] - Preparing for Surgery Before surgery, you can play an important role.  Because skin is not sterile, your skin needs to be as free of germs as possible.  You can reduce the number of germs on your skin by washing with CHG (chlorahexidine gluconate) soap before surgery.  CHG is an antiseptic cleaner which kills germs and bonds with the skin to continue killing germs even after washing. Please DO NOT use if you have an allergy to CHG or antibacterial soaps.  If your skin becomes reddened/irritated stop using the CHG and inform your nurse when you arrive at Short Stay. Do not shave (including legs and underarms) for at least 48 hours prior to the first CHG shower.  You may shave your face/neck. Please follow these instructions carefully:  1.  Shower with CHG Soap the night before surgery and the  morning of Surgery.  2.  If you choose to wash your hair, wash your hair first as usual with your  normal  shampoo.  3.  After you shampoo, rinse your hair and body thoroughly to remove the  shampoo.                           4.  Use CHG as you would any other liquid soap.  You can apply chg  directly  to the skin and wash                       Gently with a scrungie or clean washcloth.  5.  Apply the CHG Soap to your body ONLY FROM THE NECK DOWN.   Do not use on face/  open                           Wound or open sores. Avoid contact with eyes, ears mouth and genitals (private parts).                       Wash face,  Genitals (private parts) with your normal soap.             6.  Wash thoroughly, paying special attention to the area where your surgery  will be performed.  7.  Thoroughly rinse your body with warm water from the neck down.  8.  DO NOT shower/wash with your normal soap after using and rinsing off  the CHG Soap.                9.  Pat yourself dry with a clean towel.            10.  Wear clean pajamas.            11.  Place clean sheets on your bed the night of your first shower and do not  sleep with pets. Day of Surgery : Do not apply any lotions/deodorants the morning of surgery.  Please wear clean clothes to the hospital/surgery center.  FAILURE TO FOLLOW THESE INSTRUCTIONS MAY RESULT IN THE CANCELLATION OF YOUR SURGERY PATIENT SIGNATURE_________________________________  NURSE SIGNATURE__________________________________  ________________________________________________________________________   [name]  An incentive spirometer is a tool that can help keep your lungs clear and active. This tool measures how well you are filling your lungs with each breath. Taking long deep breaths may help reverse or decrease the chance of developing breathing (pulmonary) problems (especially infection) following:  A long period of time when you are unable to move or be active. BEFORE THE PROCEDURE   If the spirometer includes an indicator to show your best effort, your nurse or respiratory therapist will set it to a desired goal.  If possible, sit up straight or lean slightly forward. Try not to slouch.  Hold the incentive spirometer in an upright position. INSTRUCTIONS FOR USE  1. Sit on the edge of your bed if possible, or sit up as far as you can in bed or on a chair. 2. Hold the incentive spirometer in an upright  position. 3. Breathe out normally. 4. Place the mouthpiece in your mouth and seal your lips tightly around it. 5. Breathe in slowly and as deeply as possible, raising the piston or the ball toward the top of the column. 6. Hold your breath for 3-5 seconds or for as long as possible. Allow the piston or ball to fall to the bottom of the column. 7. Remove the mouthpiece from your mouth and breathe out normally. 8. Rest for a few seconds and repeat Steps 1 through 7 at least 10 times every 1-2 hours when you are awake. Take your time and take a few normal breaths between deep breaths. 9. The spirometer may include an indicator to show your best effort. Use the indicator as a goal to work toward during each repetition.  10. After each set of 10 deep breaths, practice coughing to be sure your lungs are clear. If you have an incision (the cut made at the time of surgery), support your incision when coughing by placing a pillow or rolled up towels firmly against it. Once you are able to get out of bed, walk around indoors and cough well. You may stop using the incentive spirometer when instructed by your caregiver.  RISKS AND COMPLICATIONS  Take your time so you do not get dizzy or light-headed.  If you are in pain, you may need to take or ask for pain medication before doing incentive spirometry. It is harder to take a deep breath if you are having pain. AFTER USE  Rest and breathe slowly and easily.  It can be helpful to keep track of a log of your progress. Your caregiver can provide you with a simple table to help with this. If you are using the spirometer at home, follow these instructions: [place] IF:   You are having difficultly using the spirometer.  You have trouble using the spirometer as often as instructed.  Your pain medication is not giving enough relief while using the spirometer.  You develop fever of 100.5 F (38.1 C) or higher. SEEK IMMEDIATE MEDICAL CARE IF:    You cough up bloody sputum that had not been present before.  You develop fever of 102 F (38.9 C) or greater.  You develop worsening pain at or near the incision site. MAKE SURE YOU:   Understand these instructions.  Will watch your condition.  Will get help right away if you are not doing well or get worse. Document Released: 09/08/2006 Document Revised: 07/21/2011 Document Reviewed: 11/09/2006 Exit[employer] Patient Information 2014 [employer], [place].   ________________________________________________________________________  WHAT IS A BLOOD TRANSFUSION? Blood Transfusion Information  A transfusion is the replacement of blood or some of its parts. Blood is made up of multiple cells which provide different functions.  Red blood cells carry oxygen and are used for blood loss replacement.  White blood cells fight against infection.  Platelets control bleeding.  Plasma helps clot blood.  Other blood products are available for specialized needs, such as hemophilia or other clotting disorders. BEFORE THE TRANSFUSION  Who gives blood for transfusions?   Healthy volunteers who are fully evaluated to make sure their blood is safe. This is blood bank blood. Transfusion therapy is the safest it has ever been in the practice of medicine. Before blood is taken from a donor, a complete history is taken to make sure that person has no history of diseases nor engages in risky social behavior (examples are intravenous drug use or sexual activity with multiple partners). The donor's travel history is screened to minimize risk of transmitting infections, such as malaria. The donated blood is tested for signs of infectious diseases, such as HIV and hepatitis. The blood is then tested to be sure it is compatible with you in order to minimize the chance of a transfusion reaction. If you or a relative donates blood, this is often done in anticipation of surgery and is not appropriate for emergency  situations. It takes many days to process the donated blood. RISKS AND COMPLICATIONS Although transfusion therapy is very safe and saves many lives, the main dangers of transfusion include:   Getting an infectious disease.  Developing a transfusion reaction. This is an allergic reaction to something in the blood you were given. Every precaution is taken to prevent this. The  decision to have a blood transfusion has been considered carefully by your caregiver before blood is given. Blood is not given unless the benefits outweigh the risks. AFTER THE TRANSFUSION  Right after receiving a blood transfusion, you will usually feel much better and more energetic. This is especially true if your red blood cells have gotten low (anemic). The transfusion raises the level of the red blood cells which carry oxygen, and this usually causes an energy increase.  The nurse administering the transfusion will monitor you carefully for complications. HOME CARE INSTRUCTIONS  No special instructions are needed after a transfusion. You may find your energy is better. Speak with your caregiver about any limitations on activity for underlying diseases you may have. SEEK MEDICAL CARE IF:   Your condition is not improving after your transfusion.  You develop redness or irritation at the intravenous (IV) site. SEEK IMMEDIATE MEDICAL CARE IF:  Any of the following symptoms occur over the next 12 hours:  Shaking chills.  You have a temperature by mouth above 102 F (38.9 C), not controlled by medicine.  Chest, back, or muscle pain.  People around you feel you are not acting correctly or are confused.  Shortness of breath or difficulty breathing.  Dizziness and fainting.  You get a rash or develop hives.  You have a decrease in urine output.  Your urine turns a dark color or changes to pink, red, or brown. Any of the following symptoms occur over the next 10 days:  You have a temperature by mouth above  102 F (38.9 C), not controlled by medicine.  Shortness of breath.  Weakness after normal activity.  The white part of the eye turns yellow (jaundice).  You have a decrease in the amount of urine or are urinating less often.  Your urine turns a dark color or changes to pink, red, or brown. Document Released: 04/25/2000 Document Revised: 07/21/2011 Document Reviewed: 12/13/2007 [facility] Patient Information 2014 [place], [place].  _______________________________________________________________________

## 2017-03-16 NOTE — H&P (Signed)
TOTAL HIP ADMISSION H&P  Patient is admitted for left total hip arthroplasty, anterior approach.  Subjective:  Chief Complaint:   Left hip primary OA / pain  HPI: Herbert Morrison, 71 y.o. male, has a history of pain and functional disability in the left hip(s) due to arthritis and patient has failed non-surgical conservative treatments for greater than 12 weeks to include NSAID's and/or analgesics, corticosteriod injections and activity modification.  Onset of symptoms was gradual starting 2+ years ago with gradually worsening course since that time.The patient noted no past surgery on the left hip(s).  Patient currently rates pain in the left hip at 9 out of 10 with activity. Patient has night pain, worsening of pain with activity and weight bearing, trendelenberg gait, pain that interfers with activities of daily living and pain with passive range of motion. Patient has evidence of periarticular osteophytes and joint space narrowing by imaging studies. This condition presents safety issues increasing the risk of falls.  There is no current active infection.  Risks, benefits and expectations were discussed with the patient.  Risks including but not limited to the risk of anesthesia, blood clots, nerve damage, blood vessel damage, failure of the prosthesis, infection and up to and including death.  Patient understand the risks, benefits and expectations and wishes to proceed with surgery.   PCP: Alanda Amass, MD  D/C Plans:       Home   Post-op Meds:       No Rx given  Tranexamic Acid:      To be given - IV   Decadron:      Is to be given  FYI:     Xarelto (issues with UC and ASA)  DME:     Rx given for - RW  PT:     No PT    Patient Active Problem List   Diagnosis Date Noted  . GERD (gastroesophageal reflux disease) 01/29/2016  . 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([place]) 07/17/2014  . Essential hypertension 07/17/2014   Past  Medical History:  Diagnosis Date  . GERD (gastroesophageal reflux disease)   . Hypertension   . 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[facility])     Past Surgical History:  Procedure Laterality Date  . left elbow     spur removed  . left knee surgery     athroscopy  . rt knee arthroscopy      No current facility-administered medications for this encounter.    Current Outpatient Medications  Medication Sig Dispense Refill Last Dose  . amLODipine (NORVASC) 10 MG tablet Take 10 mg by mouth daily.   Taking  . cetirizine (ZYRTEC) 10 MG tablet Take 10 mg by mouth daily.   Taking  . escitalopram (LEXAPRO) 20 MG tablet Take 20 mg by mouth daily.    Taking  . fluticasone (FLONASE) 50 MCG/ACT nasal spray Place 1 spray into both nostrils daily as needed for allergies.    Taking  . ibuprofen (ADVIL,MOTRIN) 200 MG tablet Take 400 mg by mouth every 6 (six) hours as needed for headache or moderate pain.     Marland Kitchen omeprazole (PRILOSEC) 20 MG capsule Take 20 mg by mouth 2 (two) times daily before a meal.    Taking  . Tetrahydrozoline HCl (VISINE OP) Place 1 drop into both eyes as needed (for allergies).      Allergies  Allergen Reactions  . Erythromycin Nausea Only    Social History   Tobacco Use  . Smoking  status: Former Research scientist (life sciences)  . Smokeless tobacco: Former Systems developer    Types: Chew    Quit date: 01/22/1965  . Tobacco comment: quit 43 years ago  Substance Use Topics  . Alcohol use: Yes    Alcohol/week: 0.0 oz    Comment: occasionally       Review of Systems  Constitutional: Negative.   HENT: Negative.   Eyes: Negative.   Respiratory: Negative.   Cardiovascular: Negative.   Gastrointestinal: Positive for heartburn.  Genitourinary: Negative.   Musculoskeletal: Positive for back pain and joint pain.  Skin: Negative.   Neurological: Negative.   Endo/Heme/Allergies: Positive for environmental allergies.  Psychiatric/Behavioral: Positive for depression. The patient is nervous/anxious.      Objective:  Physical Exam  Constitutional: He is oriented to person, place, and time. He appears well-developed.  HENT:  Head: Normocephalic.  Eyes: Pupils are equal, round, and reactive to light.  Neck: Neck supple. No JVD present. No tracheal deviation present. No thyromegaly present.  Cardiovascular: Normal rate, regular rhythm and intact distal pulses.  Respiratory: Effort normal and breath sounds normal. No respiratory distress. He has no wheezes.  GI: Soft. There is no tenderness. There is no guarding.  Musculoskeletal:       Left hip: He exhibits decreased range of motion, decreased strength, tenderness and bony tenderness. He exhibits no swelling, no deformity and no laceration.  Lymphadenopathy:    He has no cervical adenopathy.  Neurological: He is alert and oriented to person, place, and time.  Skin: Skin is warm and dry.  Psychiatric: He has a normal mood and affect.      Labs:  Estimated body mass index is 34.91 kg/m as calculated from the following:   Height as of 07/28/16: 5' 6.5" (1.689 m).   Weight as of 07/28/16: 99.6 kg (219 lb 9.6 oz).   Imaging Review Plain radiographs demonstrate severe degenerative joint disease of the left hip(s). The bone quality appears to be good for age and reported activity level.  Assessment/Plan:  End stage arthritis, left hip(s)  The patient history, physical examination, clinical judgement of the provider and imaging studies are consistent with end stage degenerative joint disease of the left hip(s) and total hip arthroplasty is deemed medically necessary. The treatment options including medical management, injection therapy, arthroscopy and arthroplasty were discussed at length. The risks and benefits of total hip arthroplasty were presented and reviewed. The risks due to aseptic loosening, infection, stiffness, dislocation/subluxation,  thromboembolic complications and other imponderables were discussed.  The patient  acknowledged the explanation, agreed to proceed with the plan and consent was signed. Patient is being admitted for inpatient treatment for surgery, pain control, PT, OT, prophylactic antibiotics, VTE prophylaxis, progressive ambulation and ADL's and discharge planning.The patient is planning to be discharged home.    [place] Pugh Carley Glendenning   PA-C  03/16/2017, 2:56 PM

## 2017-03-17 ENCOUNTER — Encounter (HOSPITAL_COMMUNITY)
Admission: RE | Admit: 2017-03-17 | Discharge: 2017-03-17 | Disposition: A | Discharge status: Home / Self Care | Payer: Medicare Other | Source: Ambulatory Visit | Attending: Orthopedic Surgery | Admitting: Orthopedic Surgery

## 2017-03-17 ENCOUNTER — Encounter (HOSPITAL_COMMUNITY): Payer: Self-pay

## 2017-03-17 ENCOUNTER — Other Ambulatory Visit: Payer: Self-pay

## 2017-03-17 ENCOUNTER — Encounter (INDEPENDENT_AMBULATORY_CARE_PROVIDER_SITE_OTHER): Payer: Self-pay

## 2017-03-17 DIAGNOSIS — Z01812 Encounter for preprocedural laboratory examination: Secondary | ICD-10-CM | POA: Diagnosis not present

## 2017-03-17 DIAGNOSIS — Z01810 Encounter for preprocedural cardiovascular examination: Secondary | ICD-10-CM | POA: Insufficient documentation

## 2017-03-17 DIAGNOSIS — M1612 Unilateral primary osteoarthritis, left hip: Secondary | ICD-10-CM | POA: Insufficient documentation

## 2017-03-17 HISTORY — DX: Malignant (primary) neoplasm, unspecified: C80.1

## 2017-03-17 HISTORY — DX: Unspecified osteoarthritis, unspecified site: M19.90

## 2017-03-17 HISTORY — DX: Personal history of other diseases of the digestive system: Z87.19

## 2017-03-17 HISTORY — DX: Pneumonia, unspecified organism: J18.9

## 2017-03-17 HISTORY — DX: Major depressive disorder, single episode, unspecified: F32.9

## 2017-03-17 HISTORY — DX: Anxiety disorder, unspecified: F41.9

## 2017-03-17 HISTORY — DX: Cardiac murmur, unspecified: R01.1

## 2017-03-17 HISTORY — DX: Depression, unspecified: F32.A

## 2017-03-17 LAB — BASIC METABOLIC PANEL
ANION GAP: 9 (ref 5–15)
BUN: 22 mg/dL — ABNORMAL HIGH (ref 6–20)
CALCIUM: 9.3 mg/dL (ref 8.9–10.3)
CHLORIDE: 108 mmol/L (ref 101–111)
CO2: 24 mmol/L (ref 22–32)
CREATININE: 1.22 mg/dL (ref 0.61–1.24)
GFR calc non Af Amer: 59 mL/min — ABNORMAL LOW (ref >60)
GLUCOSE: 87 mg/dL (ref 65–99)
Potassium: 4 mmol/L (ref 3.5–5.1)
Sodium: 141 mmol/L (ref 135–145)

## 2017-03-17 LAB — CBC
HCT: 45.1 % (ref 39.0–52.0)
HEMOGLOBIN: 14.7 g/dL (ref 13.0–17.0)
MCH: 27.7 pg (ref 26.0–34.0)
MCHC: 32.6 g/dL (ref 30.0–36.0)
MCV: 85.1 fL (ref 78.0–100.0)
PLATELETS: 342 10*3/uL (ref 150–400)
RBC: 5.3 MIL/uL (ref 4.22–5.81)
RDW: 14.6 % (ref 11.5–15.5)
WBC: 6.6 10*3/uL (ref 4.0–10.5)

## 2017-03-17 LAB — SURGICAL PCR SCREEN
MRSA, PCR: NEGATIVE
STAPHYLOCOCCUS AUREUS: NEGATIVE

## 2017-03-17 LAB — ABO/RH: ABO/RH(D): A POS

## 2017-03-18 NOTE — Progress Notes (Signed)
Clearance Dr. Lacey Jensen Cardiology 03/11/17 chart EKG 03/17/17 chart LOV note 03/11/17  Cards chart Lexiscan stress 03/11/17 chart

## 2017-03-24 ENCOUNTER — Encounter (HOSPITAL_COMMUNITY)
Admission: RE | Disposition: A | Discharge status: Home / Self Care | Payer: Self-pay | Source: Ambulatory Visit | Attending: Orthopedic Surgery

## 2017-03-24 ENCOUNTER — Inpatient Hospital Stay (HOSPITAL_COMMUNITY): Payer: Medicare Other | Admitting: Anesthesiology

## 2017-03-24 ENCOUNTER — Encounter (HOSPITAL_COMMUNITY): Payer: Self-pay | Admitting: Anesthesiology

## 2017-03-24 ENCOUNTER — Inpatient Hospital Stay (HOSPITAL_COMMUNITY)
Admission: RE | Admit: 2017-03-24 | Discharge: 2017-03-25 | DRG: 470 | Disposition: A | Discharge status: Home / Self Care | Payer: Medicare Other | Source: Ambulatory Visit | Attending: Orthopedic Surgery | Admitting: Orthopedic Surgery

## 2017-03-24 ENCOUNTER — Other Ambulatory Visit: Payer: Self-pay

## 2017-03-24 ENCOUNTER — Inpatient Hospital Stay (HOSPITAL_COMMUNITY): Payer: Medicare Other

## 2017-03-24 DIAGNOSIS — Z9889 Other specified postprocedural states: Secondary | ICD-10-CM | POA: Insufficient documentation

## 2017-03-24 DIAGNOSIS — F419 Anxiety disorder, unspecified: Secondary | ICD-10-CM | POA: Diagnosis present

## 2017-03-24 DIAGNOSIS — Z87891 Personal history of nicotine dependence: Secondary | ICD-10-CM

## 2017-03-24 DIAGNOSIS — K219 Gastro-esophageal reflux disease without esophagitis: Secondary | ICD-10-CM | POA: Diagnosis present

## 2017-03-24 DIAGNOSIS — I10 Essential (primary) hypertension: Secondary | ICD-10-CM | POA: Diagnosis present

## 2017-03-24 DIAGNOSIS — E663 Overweight: Secondary | ICD-10-CM | POA: Diagnosis present

## 2017-03-24 DIAGNOSIS — K5190 Ulcerative colitis, unspecified, without complications: Secondary | ICD-10-CM | POA: Diagnosis present

## 2017-03-24 DIAGNOSIS — M1612 Unilateral primary osteoarthritis, left hip: Principal | ICD-10-CM | POA: Diagnosis present

## 2017-03-24 DIAGNOSIS — Z96659 Presence of unspecified artificial knee joint: Secondary | ICD-10-CM

## 2017-03-24 DIAGNOSIS — Z6828 Body mass index (BMI) 28.0-28.9, adult: Secondary | ICD-10-CM

## 2017-03-24 DIAGNOSIS — K449 Diaphragmatic hernia without obstruction or gangrene: Secondary | ICD-10-CM | POA: Diagnosis present

## 2017-03-24 DIAGNOSIS — J3089 Other allergic rhinitis: Secondary | ICD-10-CM | POA: Diagnosis present

## 2017-03-24 DIAGNOSIS — Z881 Allergy status to other antibiotic agents status: Secondary | ICD-10-CM | POA: Diagnosis not present

## 2017-03-24 DIAGNOSIS — Z96649 Presence of unspecified artificial hip joint: Secondary | ICD-10-CM

## 2017-03-24 DIAGNOSIS — F329 Major depressive disorder, single episode, unspecified: Secondary | ICD-10-CM | POA: Diagnosis present

## 2017-03-24 DIAGNOSIS — M25552 Pain in left hip: Secondary | ICD-10-CM | POA: Diagnosis present

## 2017-03-24 HISTORY — PX: TOTAL HIP ARTHROPLASTY: SHX124

## 2017-03-24 LAB — TYPE AND SCREEN
ABO/RH(D): A POS
ANTIBODY SCREEN: NEGATIVE

## 2017-03-24 SURGERY — ARTHROPLASTY, HIP, TOTAL, ANTERIOR APPROACH
Anesthesia: Spinal | Site: Hip | Laterality: Left

## 2017-03-24 MED ORDER — SODIUM CHLORIDE 0.9 % IR SOLN
Status: DC | PRN
  Administered 2017-03-24: 1000 mL

## 2017-03-24 MED ORDER — DIPHENHYDRAMINE HCL 12.5 MG/5ML PO ELIX: 12.5000 mg | ORAL_SOLUTION | ORAL | Status: DC | PRN

## 2017-03-24 MED ORDER — POLYETHYLENE GLYCOL 3350 17 G PO PACK: 17.0000 g | PACK | Freq: Two times a day (BID) | ORAL | 0 refills | Status: DC

## 2017-03-24 MED ORDER — METHOCARBAMOL 500 MG PO TABS
500.0000 mg | ORAL_TABLET | Freq: Four times a day (QID) | ORAL | Status: DC | PRN
  Administered 2017-03-25 (×2): 500 mg via ORAL
  Filled 2017-03-24 (×3): qty 1

## 2017-03-24 MED ORDER — OMEPRAZOLE 20 MG PO CPDR
20.0000 mg | DELAYED_RELEASE_CAPSULE | Freq: Two times a day (BID) | ORAL | Status: DC
  Administered 2017-03-24 – 2017-03-25 (×2): 20 mg via ORAL
  Filled 2017-03-24 (×2): qty 1

## 2017-03-24 MED ORDER — METOCLOPRAMIDE HCL 5 MG PO TABS: 5.0000 mg | ORAL_TABLET | Freq: Three times a day (TID) | ORAL | Status: DC | PRN

## 2017-03-24 MED ORDER — METHOCARBAMOL 1000 MG/10ML IJ SOLN
500.0000 mg | Freq: Four times a day (QID) | INTRAVENOUS | Status: DC | PRN
  Administered 2017-03-24: 500 mg via INTRAVENOUS
  Filled 2017-03-24: qty 550

## 2017-03-24 MED ORDER — BUPIVACAINE IN DEXTROSE 0.75-8.25 % IT SOLN
INTRATHECAL | Status: DC | PRN
  Administered 2017-03-24: 2 mL via INTRATHECAL

## 2017-03-24 MED ORDER — CEFAZOLIN SODIUM-DEXTROSE 2-4 GM/100ML-% IV SOLN
INTRAVENOUS | Status: AC
  Filled 2017-03-24: qty 100

## 2017-03-24 MED ORDER — METOCLOPRAMIDE HCL 5 MG/ML IJ SOLN: 5.0000 mg | Freq: Three times a day (TID) | INTRAMUSCULAR | Status: DC | PRN

## 2017-03-24 MED ORDER — DEXAMETHASONE SODIUM PHOSPHATE 10 MG/ML IJ SOLN
INTRAMUSCULAR | Status: AC
  Filled 2017-03-24: qty 1

## 2017-03-24 MED ORDER — HYDROMORPHONE HCL 1 MG/ML IJ SOLN
0.5000 mg | INTRAMUSCULAR | Status: DC | PRN
  Administered 2017-03-24: 1 mg via INTRAVENOUS
  Filled 2017-03-24: qty 1

## 2017-03-24 MED ORDER — CHLORHEXIDINE GLUCONATE 4 % EX LIQD: 60.0000 mL | Freq: Once | CUTANEOUS | Status: DC

## 2017-03-24 MED ORDER — MAGNESIUM CITRATE PO SOLN: 1.0000 | Freq: Once | ORAL | Status: DC | PRN

## 2017-03-24 MED ORDER — SODIUM CHLORIDE 0.9 % IV SOLN: INTRAVENOUS | Status: DC

## 2017-03-24 MED ORDER — CELECOXIB 200 MG PO CAPS
200.0000 mg | ORAL_CAPSULE | Freq: Two times a day (BID) | ORAL | Status: DC
  Administered 2017-03-24 – 2017-03-25 (×2): 200 mg via ORAL
  Filled 2017-03-24 (×2): qty 1

## 2017-03-24 MED ORDER — HYDROCODONE-ACETAMINOPHEN 7.5-325 MG PO TABS
1.0000 | ORAL_TABLET | ORAL | Status: DC | PRN
  Administered 2017-03-24 (×2): 1 via ORAL
  Filled 2017-03-24 (×2): qty 1

## 2017-03-24 MED ORDER — AMLODIPINE BESYLATE 10 MG PO TABS
10.0000 mg | ORAL_TABLET | Freq: Every day | ORAL | Status: DC
  Administered 2017-03-25: 10 mg via ORAL
  Filled 2017-03-24: qty 1

## 2017-03-24 MED ORDER — ONDANSETRON HCL 4 MG/2ML IJ SOLN
INTRAMUSCULAR | Status: AC
  Filled 2017-03-24: qty 2

## 2017-03-24 MED ORDER — PROPOFOL 10 MG/ML IV BOLUS
INTRAVENOUS | Status: DC | PRN
  Administered 2017-03-24 (×2): 10 mg via INTRAVENOUS

## 2017-03-24 MED ORDER — ONDANSETRON HCL 4 MG/2ML IJ SOLN
INTRAMUSCULAR | Status: DC | PRN
  Administered 2017-03-24: 4 mg via INTRAVENOUS

## 2017-03-24 MED ORDER — FERROUS SULFATE 325 (65 FE) MG PO TABS: 325.0000 mg | ORAL_TABLET | Freq: Three times a day (TID) | ORAL | 3 refills | Status: DC

## 2017-03-24 MED ORDER — PROPOFOL 500 MG/50ML IV EMUL
INTRAVENOUS | Status: DC | PRN
  Administered 2017-03-24: 75 ug/kg/min via INTRAVENOUS

## 2017-03-24 MED ORDER — DEXAMETHASONE SODIUM PHOSPHATE 10 MG/ML IJ SOLN
10.0000 mg | Freq: Once | INTRAMUSCULAR | Status: AC
  Administered 2017-03-24: 10 mg via INTRAVENOUS

## 2017-03-24 MED ORDER — DOCUSATE SODIUM 100 MG PO CAPS
100.0000 mg | ORAL_CAPSULE | Freq: Two times a day (BID) | ORAL | Status: DC
  Administered 2017-03-24 – 2017-03-25 (×2): 100 mg via ORAL
  Filled 2017-03-24 (×2): qty 1

## 2017-03-24 MED ORDER — RIVAROXABAN 10 MG PO TABS: 10.0000 mg | ORAL_TABLET | Freq: Every day | ORAL | 0 refills | Status: DC

## 2017-03-24 MED ORDER — TRANEXAMIC ACID 1000 MG/10ML IV SOLN
1000.0000 mg | Freq: Once | INTRAVENOUS | Status: AC
  Administered 2017-03-24: 1000 mg via INTRAVENOUS
  Filled 2017-03-24: qty 1100

## 2017-03-24 MED ORDER — PROPOFOL 10 MG/ML IV BOLUS
INTRAVENOUS | Status: AC
  Filled 2017-03-24: qty 60

## 2017-03-24 MED ORDER — HYDROMORPHONE HCL 1 MG/ML IJ SOLN
INTRAMUSCULAR | Status: AC
  Filled 2017-03-24: qty 1

## 2017-03-24 MED ORDER — PROMETHAZINE HCL 25 MG/ML IJ SOLN: 6.2500 mg | INTRAMUSCULAR | Status: DC | PRN

## 2017-03-24 MED ORDER — LACTATED RINGERS IV SOLN: INTRAVENOUS | Status: DC

## 2017-03-24 MED ORDER — HYDROCODONE-ACETAMINOPHEN 7.5-325 MG PO TABS
2.0000 | ORAL_TABLET | ORAL | Status: DC | PRN
  Administered 2017-03-25 (×4): 2 via ORAL
  Filled 2017-03-24 (×4): qty 2

## 2017-03-24 MED ORDER — ALUM & MAG HYDROXIDE-SIMETH 200-200-20 MG/5ML PO SUSP: 15.0000 mL | ORAL | Status: DC | PRN

## 2017-03-24 MED ORDER — ESCITALOPRAM OXALATE 20 MG PO TABS
20.0000 mg | ORAL_TABLET | Freq: Every day | ORAL | Status: DC
Start: 2017-03-25 — End: 2017-03-25
  Administered 2017-03-25: 10:00:00 20 mg via ORAL
  Filled 2017-03-24: qty 1

## 2017-03-24 MED ORDER — TRANEXAMIC ACID 1000 MG/10ML IV SOLN
1000.0000 mg | INTRAVENOUS | Status: AC
  Administered 2017-03-24: 1000 mg via INTRAVENOUS
  Filled 2017-03-24: qty 1100

## 2017-03-24 MED ORDER — DOCUSATE SODIUM 100 MG PO CAPS: 100.0000 mg | ORAL_CAPSULE | Freq: Two times a day (BID) | ORAL | 0 refills | Status: DC

## 2017-03-24 MED ORDER — MENTHOL 3 MG MT LOZG: 1.0000 | LOZENGE | OROMUCOSAL | Status: DC | PRN

## 2017-03-24 MED ORDER — FLUTICASONE PROPIONATE 50 MCG/ACT NA SUSP
1.0000 | Freq: Every day | NASAL | Status: DC | PRN
  Filled 2017-03-24: qty 16

## 2017-03-24 MED ORDER — METHOCARBAMOL 500 MG PO TABS: 500.0000 mg | ORAL_TABLET | Freq: Four times a day (QID) | ORAL | 0 refills | Status: DC | PRN

## 2017-03-24 MED ORDER — MEPERIDINE HCL 50 MG/ML IJ SOLN: 6.2500 mg | INTRAMUSCULAR | Status: DC | PRN

## 2017-03-24 MED ORDER — LORATADINE 10 MG PO TABS
10.0000 mg | ORAL_TABLET | Freq: Every day | ORAL | Status: DC
  Administered 2017-03-25: 10 mg via ORAL
  Filled 2017-03-24: qty 1

## 2017-03-24 MED ORDER — DEXAMETHASONE SODIUM PHOSPHATE 10 MG/ML IJ SOLN: 10.0000 mg | Freq: Once | INTRAMUSCULAR | Status: DC

## 2017-03-24 MED ORDER — MIDAZOLAM HCL 2 MG/2ML IJ SOLN
INTRAMUSCULAR | Status: AC
  Filled 2017-03-24: qty 2

## 2017-03-24 MED ORDER — LACTATED RINGERS IV SOLN
INTRAVENOUS | Status: DC
  Administered 2017-03-24 (×2): via INTRAVENOUS

## 2017-03-24 MED ORDER — FERROUS SULFATE 325 (65 FE) MG PO TABS
325.0000 mg | ORAL_TABLET | Freq: Three times a day (TID) | ORAL | Status: DC
Start: 2017-03-25 — End: 2017-03-25
  Administered 2017-03-25 (×2): 325 mg via ORAL
  Filled 2017-03-24: qty 1

## 2017-03-24 MED ORDER — HYDROMORPHONE HCL 1 MG/ML IJ SOLN
0.2500 mg | INTRAMUSCULAR | Status: DC | PRN
  Administered 2017-03-24 (×2): 0.5 mg via INTRAVENOUS

## 2017-03-24 MED ORDER — CEFAZOLIN SODIUM-DEXTROSE 2-4 GM/100ML-% IV SOLN
2.0000 g | Freq: Four times a day (QID) | INTRAVENOUS | Status: AC
  Administered 2017-03-24 – 2017-03-25 (×2): 2 g via INTRAVENOUS
  Filled 2017-03-24: qty 100

## 2017-03-24 MED ORDER — POLYETHYLENE GLYCOL 3350 17 G PO PACK
17.0000 g | PACK | Freq: Two times a day (BID) | ORAL | Status: DC
  Administered 2017-03-24 – 2017-03-25 (×2): 17 g via ORAL
  Filled 2017-03-24 (×2): qty 1

## 2017-03-24 MED ORDER — RIVAROXABAN 10 MG PO TABS
10.0000 mg | ORAL_TABLET | ORAL | Status: DC
  Administered 2017-03-25: 10:00:00 10 mg via ORAL
  Filled 2017-03-24: qty 1

## 2017-03-24 MED ORDER — CEFAZOLIN SODIUM-DEXTROSE 2-4 GM/100ML-% IV SOLN
2.0000 g | INTRAVENOUS | Status: AC
  Administered 2017-03-24: 2 g via INTRAVENOUS

## 2017-03-24 MED ORDER — ONDANSETRON HCL 4 MG PO TABS: 4.0000 mg | ORAL_TABLET | Freq: Four times a day (QID) | ORAL | Status: DC | PRN

## 2017-03-24 MED ORDER — BISACODYL 10 MG RE SUPP
10.0000 mg | Freq: Every day | RECTAL | Status: DC | PRN
Start: 2017-03-24 — End: 2017-03-25

## 2017-03-24 MED ORDER — PHENOL 1.4 % MT LIQD
1.0000 | OROMUCOSAL | Status: DC | PRN
  Filled 2017-03-24: qty 177

## 2017-03-24 MED ORDER — MIDAZOLAM HCL 2 MG/2ML IJ SOLN
INTRAMUSCULAR | Status: DC | PRN
  Administered 2017-03-24 (×2): 1 mg via INTRAVENOUS

## 2017-03-24 MED ORDER — ACETAMINOPHEN 650 MG RE SUPP: 650.0000 mg | RECTAL | Status: DC | PRN

## 2017-03-24 MED ORDER — NON FORMULARY
20.0000 mg | Freq: Two times a day (BID) | Status: DC
Start: 2017-03-24 — End: 2017-03-24

## 2017-03-24 MED ORDER — ACETAMINOPHEN 325 MG PO TABS: 650.0000 mg | ORAL_TABLET | ORAL | Status: DC | PRN

## 2017-03-24 MED ORDER — STERILE WATER FOR IRRIGATION IR SOLN
Status: DC | PRN
  Administered 2017-03-24: 2000 mL

## 2017-03-24 MED ORDER — ONDANSETRON HCL 4 MG/2ML IJ SOLN: 4.0000 mg | Freq: Four times a day (QID) | INTRAMUSCULAR | Status: DC | PRN

## 2017-03-24 MED ORDER — HYDROCODONE-ACETAMINOPHEN 7.5-325 MG PO TABS: 1.0000 | ORAL_TABLET | ORAL | 0 refills | Status: DC | PRN

## 2017-03-24 SURGICAL SUPPLY — 35 items
BAG ZIPLOCK 12X15 (MISCELLANEOUS) ×2 IMPLANT
BLADE SAG 18X100X1.27 (BLADE) ×2 IMPLANT
CAPT HIP TOTAL 2 ×2 IMPLANT
CLOTH BEACON ORANGE TIMEOUT ST (SAFETY) ×2 IMPLANT
COVER PERINEAL POST (MISCELLANEOUS) ×2 IMPLANT
COVER SURGICAL LIGHT HANDLE (MISCELLANEOUS) ×2 IMPLANT
DERMABOND ADVANCED (GAUZE/BANDAGES/DRESSINGS) ×1
DERMABOND ADVANCED .7 DNX12 (GAUZE/BANDAGES/DRESSINGS) ×1 IMPLANT
DRAPE STERI IOBAN 125X83 (DRAPES) ×2 IMPLANT
DRAPE U-SHAPE 47X51 STRL (DRAPES) ×4 IMPLANT
DRESSING AQUACEL AG SP 3.5X10 (GAUZE/BANDAGES/DRESSINGS) ×1 IMPLANT
DRSG AQUACEL AG SP 3.5X10 (GAUZE/BANDAGES/DRESSINGS) ×2
DURAPREP 26ML APPLICATOR (WOUND CARE) ×2 IMPLANT
ELECT REM PT RETURN 15FT ADLT (MISCELLANEOUS) ×2 IMPLANT
GLOVE BIOGEL M STRL SZ7.5 (GLOVE) ×4 IMPLANT
GLOVE BIOGEL PI IND STRL 7.0 (GLOVE) ×2 IMPLANT
GLOVE BIOGEL PI IND STRL 7.5 (GLOVE) ×6 IMPLANT
GLOVE BIOGEL PI INDICATOR 7.0 (GLOVE) ×2
GLOVE BIOGEL PI INDICATOR 7.5 (GLOVE) ×6
GLOVE ORTHO TXT STRL SZ7.5 (GLOVE) ×2 IMPLANT
GLOVE SURG SS PI 7.0 STRL IVOR (GLOVE) ×2 IMPLANT
GLOVE SURG SS PI 7.5 STRL IVOR (GLOVE) ×2 IMPLANT
GOWN SPEC L3 XXLG W/TWL (GOWN DISPOSABLE) ×2 IMPLANT
GOWN STRL REUS W/TWL LRG LVL3 (GOWN DISPOSABLE) ×4 IMPLANT
GOWN STRL REUS W/TWL XL LVL3 (GOWN DISPOSABLE) ×4 IMPLANT
HOLDER FOLEY CATH W/STRAP (MISCELLANEOUS) ×2 IMPLANT
PACK ANTERIOR HIP CUSTOM (KITS) ×2 IMPLANT
SUT MNCRL AB 4-0 PS2 18 (SUTURE) ×2 IMPLANT
SUT STRATAFIX 0 PDS 27 VIOLET (SUTURE) ×2
SUT VIC AB 1 CT1 36 (SUTURE) ×6 IMPLANT
SUT VIC AB 2-0 CT1 27 (SUTURE) ×2
SUT VIC AB 2-0 CT1 TAPERPNT 27 (SUTURE) ×2 IMPLANT
SUTURE STRATFX 0 PDS 27 VIOLET (SUTURE) ×1 IMPLANT
TRAY FOLEY W/METER SILVER 16FR (SET/KITS/TRAYS/PACK) ×2 IMPLANT
YANKAUER SUCT BULB TIP 10FT TU (MISCELLANEOUS) ×2 IMPLANT

## 2017-03-24 NOTE — Op Note (Signed)
NAME:  Herbert Morrison                ACCOUNT NO.: [id number]      MEDICAL RECORD NO.: [id number]      FACILITY:  [facility]      PHYSICIAN:  Paralee Cancel D  DATE OF BIRTH:  10-Feb-1946     DATE OF PROCEDURE:  03/24/2017                                 OPERATIVE REPORT         PREOPERATIVE DIAGNOSIS: Left  hip osteoarthritis.      POSTOPERATIVE DIAGNOSIS:  Left hip osteoarthritis.      PROCEDURE:  Left total hip replacement through an anterior approach   utilizing DePuy THR system, component size 54 mm pinnacle cup, a size 36+4 neutral   Altrex liner, a size 4 Hi Tri Lock stem with a 36+1.5 delta ceramic   ball.      SURGEON:  Pietro Cassis. Alvan Dame, M.D.      ASSISTANT:  Nehemiah Massed, PA-C     ANESTHESIA:  Spinal.      SPECIMENS:  None.      COMPLICATIONS:  None.      BLOOD LOSS:  300 cc     DRAINS:  None.      INDICATION OF THE PROCEDURE:  Herbert Morrison is a 71 y.o. male who had   presented to office for evaluation of left hip pain.  Radiographs revealed   progressive degenerative changes with bone-on-bone   articulation to the  hip joint.  The patient had painful limited range of   motion significantly affecting their overall quality of life.  The patient was failing to    respond to conservative measures, and at this point was ready   to proceed with more definitive measures.  The patient has noted progressive   degenerative changes in his hip, progressive problems and dysfunction   with regarding the hip prior to surgery.  Consent was obtained for   benefit of pain relief.  Specific risk of infection, DVT, component   failure, dislocation, need for revision surgery, as well discussion of   the anterior versus posterior approach were reviewed.  Consent was   obtained for benefit of anterior pain relief through an anterior   approach.      PROCEDURE IN DETAIL:  The patient was brought to operative theater.   Once adequate anesthesia, preoperative  antibiotics, 2  gm of Ancef, 1 gm of Tranexamic Acid, and 10 mg of Decadron administered.   The patient was positioned supine on the OSI Hanna table.  Once adequate   padding of boney process was carried out, we had predraped out the hip, and  used fluoroscopy to confirm orientation of the pelvis and position.      The left hip was then prepped and draped from proximal iliac crest to   mid thigh with shower curtain technique.      Time-out was performed identifying the patient, planned procedure, and   extremity.     An incision was then made 2 cm distal and lateral to the   anterior superior iliac spine extending over the orientation of the   tensor fascia lata muscle and sharp dissection was carried down to the   fascia of the muscle and protractor placed in the soft tissues.  The fascia was then incised.  The muscle belly was identified and swept   laterally and retractor placed along the superior neck.  Following   cauterization of the circumflex vessels and removing some pericapsular   fat, a second cobra retractor was placed on the inferior neck.  A third   retractor was placed on the anterior acetabulum after elevating the   anterior rectus.  A L-capsulotomy was along the line of the   superior neck to the trochanteric fossa, then extended proximally and   distally.  Tag sutures were placed and the retractors were then placed   intracapsular.  We then identified the trochanteric fossa and   orientation of my neck cut, confirmed this radiographically   and then made a neck osteotomy with the femur on traction.  The femoral   head was removed without difficulty or complication.  Traction was let   off and retractors were placed posterior and anterior around the   acetabulum.      The labrum and foveal tissue were debrided.  I began reaming with a 46 mm  reamer and reamed up to 53 mm reamer with good bony bed preparation and a 54 mm  cup was chosen.  The final 54 mm Pinnacle  cup was then impacted under fluoroscopy  to confirm the depth of penetration and orientation with respect to   abduction.  A screw was placed followed by the hole eliminator.  The final  36+4 neutral Altrex liner was impacted with good visualized rim fit.  The cup was positioned anatomically within the acetabular portion of the pelvis.      At this point, the femur was rolled at 80 degrees.  Further capsule was   released off the inferior aspect of the femoral neck.  I then   released the superior capsule proximally.  The hook was placed laterally   along the femur and elevated manually and held in position with the bed   hook.  The leg was then extended and adducted with the leg rolled to 100   degrees of external rotation.  Once the proximal femur was fully   exposed, I used a box osteotome to set orientation.  I then began   broaching with the starting chili pepper broach and passed this by hand and then broached up to 4.  With the 4 broach in place I chose a high offset neck and did several trial reductions.  The offset was appropriate, leg lengths   appeared to be equal best matched with a +1.5 pedal confirmed radiographically.   Given these findings, I went ahead and dislocated the hip, repositioned all   retractors and positioned the right hip in the extended and abducted position.  The final 4 Hi Tri Lock stem was   chosen and it was impacted down to the level of neck cut.  Based on this   and the trial reduction, a 36+1.5 delta ceramic ball was chosen and   impacted onto a clean and dry trunnion, and the hip was reduced.  The   hip had been irrigated throughout the case again at this point.  I did   reapproximate the superior capsular leaflet to the anterior leaflet   using #1 Vicryl.  The fascia of the   tensor fascia lata muscle was then reapproximated using #1 Vicryl and #0 Stratafix sutures.  The   remaining wound was closed with 2-0 Vicryl and running 4-0 Monocryl.   The hip was  cleaned, dried, and dressed sterilely using Dermabond and   Aquacel dressing.  He was then brought   to recovery room in stable condition tolerating the procedure well.   Nehemiah Massed, PA-C was present for the entirety of the case involved from   preoperative positioning, perioperative retractor management, general   facilitation of the case, as well as primary wound closure as assistant.            Pietro Cassis Alvan Dame, M.D.        03/24/2017 2:47 PM

## 2017-03-24 NOTE — Anesthesia Preprocedure Evaluation (Addendum)
Anesthesia Evaluation  Patient identified by MRN, date of birth, ID band Patient awake    Reviewed: Allergy & Precautions, NPO status , Patient's Chart, lab work & pertinent test results  Airway Mallampati: I  TM Distance: >3 FB Neck ROM: Full    Dental  (+) Teeth Intact, Dental Advisory Given   Pulmonary former smoker,    breath sounds clear to auscultation       Cardiovascular hypertension, + Valvular Problems/Murmurs  Rhythm:Regular Rate:Normal     Neuro/Psych PSYCHIATRIC DISORDERS Anxiety Depression    GI/Hepatic Neg liver ROS, hiatal hernia, PUD, GERD  ,  Endo/Other  negative endocrine ROS  Renal/GU negative Renal ROS     Musculoskeletal  (+) Arthritis ,   Abdominal   Peds  Hematology negative hematology ROS (+)   Anesthesia Other Findings   Reproductive/Obstetrics                            Lab Results  Component Value Date   WBC 6.6 03/17/2017   HGB 14.7 03/17/2017   HCT 45.1 03/17/2017   MCV 85.1 03/17/2017   PLT 342 03/17/2017     Anesthesia Physical Anesthesia Plan  ASA: II  Anesthesia Plan: Spinal   Post-op Pain Management:    Induction: Intravenous  PONV Risk Score and Plan: 3 and Ondansetron, Dexamethasone, Propofol infusion and Midazolam  Airway Management Planned: Natural Airway and Nasal Cannula  Additional Equipment:   Intra-op Plan:   Post-operative Plan:   Informed Consent: I have reviewed the patients History and Physical, chart, labs and discussed the procedure including the risks, benefits and alternatives for the proposed anesthesia with the patient or authorized representative who has indicated his/her understanding and acceptance.   Dental advisory given  Plan Discussed with: CRNA  Anesthesia Plan Comments:         Anesthesia Quick Evaluation

## 2017-03-24 NOTE — Anesthesia Postprocedure Evaluation (Signed)
Anesthesia Post Note  Patient: QUINCEY QUESINBERRY  Procedure(s) Performed: LEFT TOTAL HIP ARTHROPLASTY ANTERIOR APPROACH (Left Hip)     Patient location during evaluation: PACU Anesthesia Type: Spinal Level of consciousness: oriented and awake and alert Pain management: pain level controlled Vital Signs Assessment: post-procedure vital signs reviewed and stable Respiratory status: spontaneous breathing, respiratory function stable and patient connected to nasal cannula oxygen Cardiovascular status: blood pressure returned to baseline and stable Postop Assessment: no headache, no backache, no apparent nausea or vomiting, spinal receding and patient able to bend at knees Anesthetic complications: no    Last Vitals:  Vitals:   03/24/17 1623 03/24/17 1637  BP: 138/85 (!) 144/86  Pulse: (!) 57 (!) 57  Resp: 12 16  Temp: (!) 36.4 C 36.5 C  SpO2:  92%    Last Pain:  Vitals:   03/24/17 1623  TempSrc:   PainSc: 2                  Effie Berkshire

## 2017-03-24 NOTE — Discharge Instructions (Addendum)

## 2017-03-24 NOTE — Interval H&P Note (Signed)
History and Physical Interval Note:  03/24/2017 12:03 PM  Herbert Morrison  has presented today for surgery, with the diagnosis of Left hip osteoarthritis  The various methods of treatment have been discussed with the patient and family. After consideration of risks, benefits and other options for treatment, the patient has consented to  Procedure(s) with comments: LEFT TOTAL HIP ARTHROPLASTY ANTERIOR APPROACH (Left) - 70 mins as a surgical intervention .  The patient's history has been reviewed, patient examined, no change in status, stable for surgery.  I have reviewed the patient's chart and labs.  Questions were answered to the patient's satisfaction.     Mauri Pole

## 2017-03-24 NOTE — Transfer of Care (Signed)
Immediate Anesthesia Transfer of Care Note  Patient: Herbert Morrison  Procedure(s) Performed: LEFT TOTAL HIP ARTHROPLASTY ANTERIOR APPROACH (Left Hip)  Patient Location: PACU  Anesthesia Type:MAC and Spinal  Level of Consciousness: awake, alert  and patient cooperative  Airway & Oxygen Therapy: Patient Spontanous Breathing and Patient connected to face mask oxygen  Post-op Assessment: Report given to RN and Post -op Vital signs reviewed and stable  Post vital signs: Reviewed and stable  Last Vitals:  Vitals:   03/24/17 1155 03/24/17 1156  BP:  (!) 156/88  Pulse: (!) 57   Resp: 18   Temp: 36.7 C   SpO2: 96%     Last Pain:  Vitals:   03/24/17 1155  TempSrc: Oral      Patients Stated Pain Goal: 4 (73/40/37 0964)  Complications: No apparent anesthesia complications

## 2017-03-24 NOTE — Anesthesia Procedure Notes (Signed)
Spinal  Patient location during procedure: OR Start time: 03/24/2017 1:20 PM End time: 03/24/2017 1:22 PM Staffing Anesthesiologist: Effie Berkshire, MD Performed: anesthesiologist  Preanesthetic Checklist Completed: patient identified, site marked, surgical consent, pre-op evaluation, timeout performed, IV checked, risks and benefits discussed and monitors and equipment checked Spinal Block Patient position: sitting Prep: Betadine Patient monitoring: heart rate, continuous pulse ox, blood pressure and cardiac monitor Approach: midline Location: L4-5 Injection technique: single-shot Needle Needle type: Introducer and Pencan  Needle gauge: 24 G Needle length: 9 cm Additional Notes Negative paresthesia. Negative blood return. Positive free-flowing CSF. Expiration date of kit checked and confirmed. Patient tolerated procedure well, without complications.

## 2017-03-24 NOTE — Anesthesia Procedure Notes (Signed)
Procedure Name: MAC Date/Time: 03/24/2017 1:24 PM Performed by: Dione Booze, CRNA Pre-anesthesia Checklist: Patient identified, Emergency Drugs available, Suction available and Patient being monitored Patient Re-evaluated:Patient Re-evaluated prior to induction Oxygen Delivery Method: Simple face mask Placement Confirmation: positive ETCO2

## 2017-03-25 DIAGNOSIS — E663 Overweight: Secondary | ICD-10-CM | POA: Diagnosis present

## 2017-03-25 LAB — CBC
HCT: 40.8 % (ref 39.0–52.0)
Hemoglobin: 13.4 g/dL (ref 13.0–17.0)
MCH: 27.9 pg (ref 26.0–34.0)
MCHC: 32.8 g/dL (ref 30.0–36.0)
MCV: 84.8 fL (ref 78.0–100.0)
PLATELETS: 281 10*3/uL (ref 150–400)
RBC: 4.81 MIL/uL (ref 4.22–5.81)
RDW: 14.6 % (ref 11.5–15.5)
WBC: 16.3 10*3/uL — ABNORMAL HIGH (ref 4.0–10.5)

## 2017-03-25 LAB — BASIC METABOLIC PANEL
ANION GAP: 5 (ref 5–15)
BUN: 16 mg/dL (ref 6–20)
CHLORIDE: 107 mmol/L (ref 101–111)
CO2: 26 mmol/L (ref 22–32)
Calcium: 8.6 mg/dL — ABNORMAL LOW (ref 8.9–10.3)
Creatinine, Ser: 1.06 mg/dL (ref 0.61–1.24)
GFR calc Af Amer: >60 mL/min (ref >60)
GFR calc non Af Amer: >60 mL/min (ref >60)
GLUCOSE: 167 mg/dL — AB (ref 65–99)
POTASSIUM: 4.3 mmol/L (ref 3.5–5.1)
Sodium: 138 mmol/L (ref 135–145)

## 2017-03-25 NOTE — Progress Notes (Signed)
Occupational Therapy Treatment Patient Details Name: Herbert Morrison MRN: [id number] DOB: 01/13/1946 Today's Date: 03/25/2017    History of present illness Pt s/p L THR   OT comments  OT education complete  Follow Up Recommendations  No OT follow up    Equipment Recommendations  None recommended by OT       Precautions / Restrictions Precautions Precautions: Fall Restrictions Weight Bearing Restrictions: No Other Position/Activity Restrictions: WBAT       Mobility Bed Mobility Overal bed mobility: Needs Assistance Bed Mobility: Supine to Sit;Sit to Supine     Supine to sit: Supervision Sit to supine: Supervision   General bed mobility comments: min cues for sequence and use of R LE to self assist  Transfers Overall transfer level: Needs assistance Equipment used: Rolling walker (2 wheeled) Transfers: Sit to/from [facility] Sit to Stand: Supervision         General transfer comment: cues for LE management and use of UEs to self assist    Balance                                           ADL either performed or assessed with clinical judgement   ADL Overall ADL's : Needs assistance/impaired Eating/Feeding: Set up;Sitting   Grooming: Set up;Sitting   Upper Body Bathing: Set up;Sitting   Lower Body Bathing: Minimal assistance;Sit to/from stand;Cueing for safety;Cueing for sequencing   Upper Body Dressing : Set up;Sitting   Lower Body Dressing: Minimal assistance;Sit to/from stand;Cueing for safety;Cueing for sequencing   Toilet Transfer: Supervision/safety;RW;Comfort height toilet   Toileting- Clothing Manipulation and Hygiene: Sit to/from stand;Cueing for safety;Cueing for sequencing   Tub/ Shower Transfer: Walk-in shower;Supervision/safety;Cueing for safety   Functional mobility during ADLs: Supervision/safety General ADL Comments: wife will A as needed     Vision Patient Visual Report: No change from  baseline            Cognition Arousal/Alertness: Awake/alert Behavior During Therapy: WFL for tasks assessed/performed Overall Cognitive Status: Within Functional Limits for tasks assessed                                                     Pertinent Vitals/ Pain       Pain Assessment: 0-10 Pain Score: 4  Pain Location: L hip Pain Descriptors / Indicators: Aching;Sore Pain Intervention(s): Limited activity within patient's tolerance;Monitored during session  [place] expects to be discharged to:: Private residence Living Arrangements: Spouse/significant other;Children Available Help at Discharge: Family Type of Home: House Home Access: Stairs to enter Technical brewer of Steps: 1+1 Entrance Stairs-Rails: None Home Layout: One level     Bathroom Shower/Tub: Walk-in shower         Home Equipment: Environmental consultant - 2 wheels;Cane - single point          Prior Functioning/Environment Level of Independence: Independent            Frequency           Progress Toward Goals  OT Goals(current goals can now be found in the care plan section)     Acute Rehab OT Goals Patient Stated Goal: Regain IND asap  Plan         AM-PAC  PT "6 Clicks" Daily Activity     Outcome Measure   Help from another person eating meals?: None Help from another person taking care of personal grooming?: None Help from another person toileting, which includes using toliet, bedpan, or urinal?: A Little Help from another person bathing (including washing, rinsing, drying)?: A Little Help from another person to put on and taking off regular upper body clothing?: None Help from another person to put on and taking off regular lower body clothing?: A Little 6 Click Score: 21    End of Session    OT Visit Diagnosis: Muscle weakness (generalized) (M62.81)   Activity Tolerance Patient tolerated treatment well   Patient Left in chair   Nurse  Communication Mobility status        Time: 8333-8329 OT Time Calculation (min): 19 min  Charges: OT Evaluation $OT Eval Low Complexity: [place], [place]   Betsy Pries 03/25/2017, 2:14 PM

## 2017-03-25 NOTE — Progress Notes (Signed)
Physical Therapy Treatment Patient Details Name: Herbert Morrison MRN: [id number] DOB: 29-Feb-1946 Today's Date: 03/25/2017    History of Present Illness Pt s/p L THR    PT Comments    Pt very motivated and progressing well with mobility.  Pt and spouse present and reviewed home therex with progression, stairs and car transfers.   Follow Up Recommendations  DC plan and follow up therapy as arranged by surgeon     Equipment Recommendations  None recommended by PT    Recommendations for Other Services OT consult     Precautions / Restrictions Precautions Precautions: Fall Restrictions Weight Bearing Restrictions: No Other Position/Activity Restrictions: WBAT    Mobility  Bed Mobility Overal bed mobility: Needs Assistance Bed Mobility: Supine to Sit;Sit to Supine     Supine to sit: Supervision Sit to supine: Supervision   General bed mobility comments: min cues for sequence and use of R LE to self assist  Transfers Overall transfer level: Needs assistance Equipment used: Rolling walker (2 wheeled) Transfers: Sit to/from Stand Sit to Stand: Supervision         General transfer comment: cues for LE management and use of UEs to self assist  Ambulation/Gait Ambulation/Gait assistance: Min guard;Supervision Ambulation Distance (Feet): 300 Feet Assistive device: Rolling walker (2 wheeled) Gait Pattern/deviations: Step-to pattern;Step-through pattern;Decreased step length - right;Decreased step length - left;Shuffle;Trunk flexed Gait velocity: decr Gait velocity interpretation: Below normal speed for age/gender General Gait Details: cues for posture, ER on L, position from RW and initial sequence   Stairs Stairs: Yes   Stair Management: No rails;Forwards;Step to pattern;With walker;Backwards Number of Stairs: 3 General stair comments: single step twice  with cues for sequence and foot/RW placement.  And stool bkwds to get into high bed  Wheelchair Mobility     Modified Rankin (Stroke Patients Only)       Balance                                            Cognition Arousal/Alertness: Awake/alert Behavior During Therapy: WFL for tasks assessed/performed Overall Cognitive Status: Within Functional Limits for tasks assessed                                        Exercises Total Joint Exercises Ankle Circles/Pumps: AROM;Both;15 reps;Supine Quad Sets: AROM;Both;10 reps;Supine Heel Slides: AAROM;Left;20 reps;Supine Hip ABduction/ADduction: AAROM;Left;15 reps;Supine Long Arc Quad: AROM;Left;10 reps;Seated    General Comments        Pertinent Vitals/Pain Pain Assessment: 0-10 Pain Score: 4  Pain Location: L hip Pain Descriptors / Indicators: Aching;Sore Pain Intervention(s): Limited activity within patient's tolerance;Monitored during session;Premedicated before session;Ice applied    Home Living Family/patient expects to be discharged to:: Private residence Living Arrangements: Spouse/significant other;Children Available Help at Discharge: Family Type of Home: House Home Access: Stairs to enter Entrance Stairs-Rails: None Home Layout: One level Home Equipment: Environmental consultant - 2 wheels;Cane - single point      Prior Function Level of Independence: Independent          PT Goals (current goals can now be found in the care plan section) Acute Rehab PT Goals Patient Stated Goal: Regain IND asap PT Goal Formulation: With patient Time For Goal Achievement: 03/28/17 Potential to Achieve Goals: Good Progress towards PT goals: Progressing  toward goals    Frequency    7X/week      PT Plan Current plan remains appropriate    Co-evaluation              AM-PAC PT "6 Clicks" Daily Activity  Outcome Measure  Difficulty turning over in bed (including adjusting bedclothes, sheets and blankets)?: A Lot Difficulty moving from lying on back to sitting on the side of the bed? : A  Lot Difficulty sitting down on and standing up from a chair with arms (e.g., wheelchair, bedside commode, etc,.)?: A Little Help needed moving to and from a bed to chair (including a wheelchair)?: A Little Help needed walking in hospital room?: A Little Help needed climbing 3-5 steps with a railing? : A Little 6 Click Score: 16    End of Session Equipment Utilized During Treatment: Gait belt Activity Tolerance: Patient tolerated treatment well Patient left: with call bell/phone within reach;with family/visitor present;in bed Nurse Communication: Mobility status PT Visit Diagnosis: Unsteadiness on feet (R26.81);Difficulty in walking, not elsewhere classified (R26.2)     Time: 1216-2446 PT Time Calculation (min) (ACUTE ONLY): 41 min  Charges:  $Gait Training: 8-22 mins $Therapeutic Exercise: 8-22 mins                    G Codes:       Pg [phone]    [name] 03/25/2017, 12:47 PM

## 2017-03-25 NOTE — Progress Notes (Signed)
     Subjective: 1 Day Post-Op Procedure(s) (LRB): LEFT TOTAL HIP ARTHROPLASTY ANTERIOR APPROACH (Left)   Patient reports pain as mild, pain controlled. No events throughout the night. Discussed home exercises to strength the leg.  Ready to be discharged home.  Objective:   VITALS:   Vitals:   03/25/17 0537 03/25/17 0931  BP: 123/80 124/76  Pulse: (!) 58 (!) 52  Resp: 15 16  Temp: (!) 97.5 F (36.4 C) 98 F (36.7 C)  SpO2: 97% 94%    Dorsiflexion/Plantar flexion intact Incision: dressing C/D/I No cellulitis present Compartment soft  LABS Recent Labs    03/25/17 0609  HGB 13.4  HCT 40.8  WBC 16.3*  PLT 281    Recent Labs    03/25/17 0609  NA 138  K 4.3  BUN 16  CREATININE 1.06  GLUCOSE 167*     Assessment/Plan: 1 Day Post-Op Procedure(s) (LRB): LEFT TOTAL HIP ARTHROPLASTY ANTERIOR APPROACH (Left) Foley cath d/c'ed Advance diet Up with therapy D/C IV fluids Discharge home Follow up in 2 weeks at [facility]. Follow up with OLIN,Deziah Renwick D in 2 weeks.  Contact information:  [facility] [address] [place] [phone]    Overweight (BMI 25-29.9) Estimated body mass index is 28.62 kg/m as calculated from the following:   Height as of this encounter: 5' 6.5" (1.689 m).   Weight as of this encounter: 81.6 kg (180 lb). Patient also counseled that weight may inhibit the healing process Patient counseled that losing weight will help with future health issues        West Pugh. Merel Santoli   PAC  03/25/2017, 9:45 AM

## 2017-03-25 NOTE — Evaluation (Signed)
Physical Therapy Evaluation Patient Details Name: Herbert Morrison MRN: [id number] DOB: 08-14-1945 Today's Date: 03/25/2017   History of Present Illness  Pt s/p L THR  Clinical Impression  Pt s/p L THR and presents with decreased L LE strength/ROM and post op pain limiting functional mobility.  Pt should progress to dc home with family assist.    Follow Up Recommendations DC plan and follow up therapy as arranged by surgeon    Equipment Recommendations  None recommended by PT    Recommendations for Other Services       Precautions / Restrictions Precautions Precautions: Fall Restrictions Weight Bearing Restrictions: No Other Position/Activity Restrictions: WBAT      Mobility  Bed Mobility Overal bed mobility: Needs Assistance Bed Mobility: Supine to Sit     Supine to sit: Min guard     General bed mobility comments: min cues for sequence and use of R LE to self assist  Transfers Overall transfer level: Needs assistance Equipment used: Rolling walker (2 wheeled) Transfers: Sit to/from Stand Sit to Stand: Min guard         General transfer comment: cues for LE management and use of UEs to self assist  Ambulation/Gait Ambulation/Gait assistance: Min assist Ambulation Distance (Feet): 200 Feet Assistive device: Rolling walker (2 wheeled) Gait Pattern/deviations: Step-to pattern;Step-through pattern;Decreased step length - right;Decreased step length - left;Shuffle;Trunk flexed Gait velocity: decr Gait velocity interpretation: Below normal speed for age/gender General Gait Details: cues for posture, ER on L, position from RW and initial sequence  Stairs            Wheelchair Mobility    Modified Rankin (Stroke Patients Only)       Balance                                             Pertinent Vitals/Pain Pain Assessment: 0-10 Pain Score: 4  Pain Location: L hip Pain Descriptors / Indicators: Aching;Sore Pain  Intervention(s): Limited activity within patient's tolerance;Monitored during session;Premedicated before session;Ice applied    Home Living Family/patient expects to be discharged to:: Private residence Living Arrangements: Spouse/significant other;Children Available Help at Discharge: Family Type of Home: House Home Access: Stairs to enter Entrance Stairs-Rails: None Entrance Stairs-Number of Steps: 1+1 Home Layout: One level Home Equipment: Environmental consultant - 2 wheels;Cane - single point      Prior Function Level of Independence: Independent               Hand Dominance        Extremity/Trunk Assessment   Upper Extremity Assessment Upper Extremity Assessment: Overall WFL for tasks assessed    Lower Extremity Assessment Lower Extremity Assessment: LLE deficits/detail LLE Deficits / Details: Strength at hip 2+/5 with AAROM at hip to 85 flex and 20 abd    Cervical / Trunk Assessment Cervical / Trunk Assessment: Normal  Communication   Communication: No difficulties  Cognition Arousal/Alertness: Awake/alert Behavior During Therapy: WFL for tasks assessed/performed Overall Cognitive Status: Within Functional Limits for tasks assessed                                        General Comments      Exercises Total Joint Exercises Ankle Circles/Pumps: AROM;Both;15 reps;Supine Quad Sets: AROM;Both;10 reps;Supine Heel Slides: AAROM;Left;20 reps;Supine Hip ABduction/ADduction:  AAROM;Left;15 reps;Supine   Assessment/Plan    PT Assessment Patient needs continued PT services  PT Problem List Decreased strength;Decreased range of motion;Decreased activity tolerance;Decreased mobility;Pain;Decreased knowledge of use of DME       PT Treatment Interventions DME instruction;Gait training;Stair training;Functional mobility training;Therapeutic activities;Therapeutic exercise;Patient/family education    PT Goals (Current goals can be found in the Care Plan section)   Acute Rehab PT Goals Patient Stated Goal: Regain IND asap PT Goal Formulation: With patient Time For Goal Achievement: 03/28/17 Potential to Achieve Goals: Good    Frequency 7X/week   Barriers to discharge        Co-evaluation               AM-PAC PT "6 Clicks" Daily Activity  Outcome Measure Difficulty turning over in bed (including adjusting bedclothes, sheets and blankets)?: A Lot Difficulty moving from lying on back to sitting on the side of the bed? : A Lot Difficulty sitting down on and standing up from a chair with arms (e.g., wheelchair, bedside commode, etc,.)?: A Little Help needed moving to and from a bed to chair (including a wheelchair)?: A Little Help needed walking in hospital room?: A Little Help needed climbing 3-5 steps with a railing? : A Little 6 Click Score: 16    End of Session Equipment Utilized During Treatment: Gait belt Activity Tolerance: Patient tolerated treatment well Patient left: in chair;with call bell/phone within reach;with family/visitor present Nurse Communication: Mobility status PT Visit Diagnosis: Unsteadiness on feet (R26.81);Difficulty in walking, not elsewhere classified (R26.2)    Time: 7218-2883 PT Time Calculation (min) (ACUTE ONLY): 32 min   Charges:   PT Evaluation $PT Eval Low Complexity: 1 Low PT Treatments $Therapeutic Exercise: 8-22 mins   PT G Codes:        Pg [phone]   Philisha Weinel 03/25/2017, 12:38 PM

## 2017-03-30 NOTE — Discharge Summary (Signed)
Physician Discharge Summary  Patient ID: RAYVION STUMPH MRN: [id number] DOB/AGE: 1945-07-08 71 y.o.  Admit date: 03/24/2017 Discharge date: 03/25/2017   Procedures:  Procedure(s) (LRB): LEFT TOTAL HIP ARTHROPLASTY ANTERIOR APPROACH (Left)  Attending Physician:  Dr. Paralee Cancel   Admission Diagnoses:   Left hip primary OA / pain  Discharge Diagnoses:  Principal Problem:   S/P left THA, AA Active Problems:   Overweight (BMI 25.0-29.9)  Past Medical History:  Diagnosis Date  . Anxiety   . Arthritis   . Cancer ([place])    skin cancer removed Left shoulder Basal cell  . Depression   . GERD (gastroesophageal reflux disease)   . Heart murmur    as a child  . History of hiatal hernia   . Hypertension   . Pneumonia   . 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([place])     HPI:    Herbert Morrison, 71 y.o. male, has a history of pain and functional disability in the left hip(s) due to arthritis and patient has failed non-surgical conservative treatments for greater than 12 weeks to include NSAID's and/or analgesics, corticosteriod injections and activity modification.  Onset of symptoms was gradual starting 2+ years ago with gradually worsening course since that time.The patient noted no past surgery on the left hip(s).  Patient currently rates pain in the left hip at 9 out of 10 with activity. Patient has night pain, worsening of pain with activity and weight bearing, trendelenberg gait, pain that interfers with activities of daily living and pain with passive range of motion. Patient has evidence of periarticular osteophytes and joint space narrowing by imaging studies. This condition presents safety issues increasing the risk of falls. There is no current active infection.  Risks, benefits and expectations were discussed with the patient.  Risks including but not limited to the risk of anesthesia, blood clots, nerve damage, blood vessel damage, failure of the prosthesis, infection and up to and including  death.  Patient understand the risks, benefits and expectations and wishes to proceed with surgery.  PCP: Alanda Amass, MD   Discharged Condition: good  Hospital Course:  Patient underwent the above stated procedure on 03/24/2017. Patient tolerated the procedure well and brought to the recovery room in good condition and subsequently to the floor.  POD #1 BP: 124/76 ; Pulse: 52 ; Temp: 98 F (36.7 C) ; Resp: 16 Patient reports pain as mild, pain controlled. No events throughout the night. Discussed home exercises to strength the leg.  Ready to be discharged home. Dorsiflexion/plantar flexion intact, incision: dressing C/D/I, no cellulitis present and compartment soft.   LABS  Basename    HGB     13.4  HCT     40.8    Discharge Exam: General appearance: alert, cooperative and no distress Extremities: Homans sign is negative, no sign of DVT, no edema, redness or tenderness in the calves or thighs and no ulcers, gangrene or trophic changes  Disposition: Home with follow up in 2 weeks   Follow-up Information    Paralee Cancel, MD. Schedule an appointment as soon as possible for a visit in 2 week(s).   Specialty:  Orthopedic Surgery Contact information: [address] [place] [address] [phone]           Discharge Instructions    Call MD / Call 911   Complete by:  As directed    If you experience chest pain or shortness of breath, CALL 911 and be transported to the hospital emergency  room.  If you develope a fever above 101 F, pus (white drainage) or increased drainage or redness at the wound, or calf pain, call your surgeon's office.   Change dressing   Complete by:  As directed    Maintain surgical dressing until follow up in the clinic. If the edges start to pull up, may reinforce with tape. If the dressing is no longer working, may remove and cover with gauze and tape, but must keep the area dry and clean.  Call with any questions or concerns.     Constipation Prevention   Complete by:  As directed    Drink plenty of fluids.  Prune juice may be helpful.  You may use a stool softener, such as Colace (over the counter) 100 mg twice a day.  Use MiraLax (over the counter) for constipation as needed.   Diet - low sodium heart healthy   Complete by:  As directed    Discharge instructions   Complete by:  As directed    Maintain surgical dressing until follow up in the clinic. If the edges start to pull up, may reinforce with tape. If the dressing is no longer working, may remove and cover with gauze and tape, but must keep the area dry and clean.  Follow up in 2 weeks at [facility]. Call with any questions or concerns.   Increase activity slowly as tolerated   Complete by:  As directed    Weight bearing as tolerated with assist device (walker, cane, etc) as directed, use it as long as suggested by your surgeon or therapist, typically at least 4-6 weeks.   TED hose   Complete by:  As directed    Use stockings (TED hose) for 2 weeks on both leg(s).  You may remove them at night for sleeping.      Allergies as of 03/25/2017      Reactions   Erythromycin Nausea Only      Medication List    STOP taking these medications   ibuprofen 200 MG tablet Commonly known as:  ADVIL,MOTRIN     TAKE these medications   amLODipine 10 MG tablet Commonly known as:  NORVASC Take 10 mg by mouth daily.   cetirizine 10 MG tablet Commonly known as:  ZYRTEC Take 10 mg by mouth daily.   docusate sodium 100 MG capsule Commonly known as:  COLACE Take 1 capsule (100 mg total) 2 (two) times daily by mouth.   escitalopram 20 MG tablet Commonly known as:  LEXAPRO Take 20 mg by mouth daily.   ferrous sulfate 325 (65 FE) MG tablet Commonly known as:  FERROUSUL Take 1 tablet (325 mg total) 3 (three) times daily with meals by mouth.   fluticasone 50 MCG/ACT nasal spray Commonly known as:  FLONASE Place 1 spray into both nostrils daily  as needed for allergies.   HYDROcodone-acetaminophen 7.5-325 MG tablet Commonly known as:  NORCO Take 1-2 tablets every 4 (four) hours as needed by mouth for moderate pain or severe pain.   methocarbamol 500 MG tablet Commonly known as:  ROBAXIN Take 1 tablet (500 mg total) every 6 (six) hours as needed by mouth for muscle spasms.   omeprazole 20 MG capsule Commonly known as:  PRILOSEC Take 20 mg by mouth 2 (two) times daily before a meal.   polyethylene glycol packet Commonly known as:  MIRALAX / GLYCOLAX Take 17 g 2 (two) times daily by mouth.   rivaroxaban 10 MG Tabs tablet Commonly known  as:  XARELTO Take 1 tablet (10 mg total) daily by mouth.   VISINE OP Place 1 drop into both eyes as needed (for allergies).            Discharge Care Instructions  (From admission, onward)        Start     Ordered   03/25/17 0000  Change dressing    Comments:  Maintain surgical dressing until follow up in the clinic. If the edges start to pull up, may reinforce with tape. If the dressing is no longer working, may remove and cover with gauze and tape, but must keep the area dry and clean.  Call with any questions or concerns.   03/25/17 9643       Signed: West Pugh. Rumi Taras   PA-C  03/30/2017, 11:29 AM

## 2017-03-31 ENCOUNTER — Encounter (HOSPITAL_COMMUNITY): Payer: Self-pay | Admitting: Orthopedic Surgery

## 2017-07-28 ENCOUNTER — Ambulatory Visit (INDEPENDENT_AMBULATORY_CARE_PROVIDER_SITE_OTHER): Payer: Medicare Other | Admitting: Internal Medicine

## 2017-08-10 ENCOUNTER — Ambulatory Visit (INDEPENDENT_AMBULATORY_CARE_PROVIDER_SITE_OTHER): Payer: Medicare Other | Admitting: Internal Medicine

## 2017-08-10 ENCOUNTER — Encounter (INDEPENDENT_AMBULATORY_CARE_PROVIDER_SITE_OTHER): Payer: Self-pay | Admitting: Internal Medicine

## 2017-08-10 VITALS — BP 160/100 | HR 68 | Temp 97.6°F | Ht 65.5 in | Wt 177.4 lb

## 2017-08-10 DIAGNOSIS — K5120 Ulcerative (chronic) proctitis without complications: Secondary | ICD-10-CM | POA: Diagnosis not present

## 2017-08-10 NOTE — Patient Instructions (Signed)
OV in 1 year.  

## 2017-08-10 NOTE — Progress Notes (Signed)
Subjective:    Patient ID: Herbert Morrison, male    DOB: 12/26/1945, 72 y.o.   MRN: [id number]  HPI Here today for f/u. Last seen in March of 2018. Hx of UC. Diagnosed in 1995. Not maintained on any UC medication due to expense. His last colonoscopy was in 2017. There was no active disease.  Recently underwent a Left total hip arthroplasty. States his hip feels much better. He is having a BM daily and sometimes twice a day. No melena or BRRB. No GI complaints. GERD controlled with Omeprazole.      He underwent an Colonoscopy 04/09/2016 for hx of UC.    Impression: One small polyp in the cecum, removed with a cold  snare. Complete resection. Polyp tissue not  retrieved. - Normal mucosa in the entire examined colon. - Scar in the rectum and in the sigmoid colon. - Diverticulosis in the sigmoid colon. - The distal rectum and anal verge are normal on  retroflexion view.   CBC    Component Value Date/Time   WBC 16.3 (H) 03/25/2017 0609   RBC 4.81 03/25/2017 0609   HGB 13.4 03/25/2017 0609   HCT 40.8 03/25/2017 0609   PLT 281 03/25/2017 0609   MCV 84.8 03/25/2017 0609   MCH 27.9 03/25/2017 0609   MCHC 32.8 03/25/2017 0609   RDW 14.6 03/25/2017 0609   LYMPHSABS 1,587 01/29/2016 0859   MONOABS 759 01/29/2016 0859   EOSABS 276 01/29/2016 0859   BASOSABS 69 01/29/2016 0859      Review of Systems Past Medical History:  Diagnosis Date  . Anxiety   . Arthritis   . Cancer ([place])    skin cancer removed Left shoulder Basal cell  . Depression   . GERD (gastroesophageal reflux disease)   . Heart murmur    as a child  . History of hiatal hernia   . Hypertension   . Pneumonia   . 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[facility])     Past Surgical History:  Procedure Laterality Date  . COLONOSCOPY N/A  04/09/2016   Procedure: COLONOSCOPY;  Surgeon: Rogene Houston, MD;  Location: [facility];  Service: Endoscopy;  Laterality: N/A;  1:45  . JOINT REPLACEMENT     L hip Dr Alvan Dame 03/24/17  . left elbow     spur removed  . left knee surgery     open surgery for a meniscus tear 1970's  . POLYPECTOMY  04/09/2016   Procedure: POLYPECTOMY;  Surgeon: Rogene Houston, MD;  Location: [facility];  Service: Endoscopy;;  cecal polyp  . rt knee arthroscopy    . 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   Procedure: LEFT TOTAL HIP ARTHROPLASTY ANTERIOR APPROACH;  Surgeon: Paralee Cancel, MD;  Location: [facility] OR[facility];  Service: Orthopedics;  Laterality: Left;  70 mins    Allergies  Allergen Reactions  . Erythromycin Nausea Only    Current Outpatient Medications on File Prior to Visit  Medication Sig Dispense Refill  . amLODipine (NORVASC) 10 MG tablet Take 10 mg by mouth daily.    . cetirizine (ZYRTEC) 10 MG tablet Take 10 mg by mouth daily.    Marland Kitchen escitalopram (LEXAPRO) 20 MG tablet Take 20 mg by mouth daily.     Marland Kitchen omeprazole (PRILOSEC) 20 MG capsule Take 20 mg by mouth 2 (two) times daily before a meal.     . HYDROcodone-acetaminophen (NORCO) 7.5-325 MG tablet Take 1-2 tablets every 4 (four) hours as needed by mouth for moderate pain or  severe pain. 60 tablet 0   No current facility-administered medications on file prior to visit.         Objective:   Physical Exam Blood pressure (!) 160/100, pulse 68, temperature 97.6 F (36.4 C), height 5' 5.5" (1.664 m), weight 177 lb 6.4 oz (80.5 kg). Alert and oriented. Skin warm and dry. Oral mucosa is moist.   . Sclera anicteric, conjunctivae is pink. Thyroid not enlarged. No cervical lymphadenopathy. Lungs clear. Heart regular rate and rhythm.  Abdomen is soft. Bowel sounds are positive. No hepatomegaly. No abdominal masses felt. No tenderness.  No edema to lower extremities            Assessment & Plan:  UC. He is doing well. No GI complaints.  OV in 1  year.

## 2018-08-11 ENCOUNTER — Ambulatory Visit (INDEPENDENT_AMBULATORY_CARE_PROVIDER_SITE_OTHER): Payer: Medicare Other | Admitting: Internal Medicine

## 2018-08-11 ENCOUNTER — Other Ambulatory Visit: Payer: Self-pay

## 2018-08-11 ENCOUNTER — Encounter (INDEPENDENT_AMBULATORY_CARE_PROVIDER_SITE_OTHER): Payer: Self-pay | Admitting: Internal Medicine

## 2018-08-11 DIAGNOSIS — K5120 Ulcerative (chronic) proctitis without complications: Secondary | ICD-10-CM | POA: Diagnosis not present

## 2018-08-11 NOTE — Progress Notes (Addendum)
   Subjective:  PCP   Robb Matar PA  Patient ID: Herbert Morrison, male    DOB: 04-02-1946, 73 y.o.   MRN: [id number] Start time 930am.  Total time 15 minutes.  Telephone visit due to COVID-19 v  Telephone office visit.  Patient requested office visit. Patient is at home . I am in the office. Did not do video. Last seen 08/10/2017. Hx of UC. Diagnosed in 1995. Not maintained on any UC medication due to the  Expense. Previous patient of Dr. West Carbo.  His last colonoscopy was in 2017 by Dr. Laural Golden. .  No active disease. States is doing good. Wt 176.2 4 days ago. BMs are moving okay. He goes 3 times a week. No melena or BRRB. Really no GI complaints. GERD controlled from Omeprazole. He is staying active. He goes to the [facility]. He mows yards.    Past Medical History:  Diagnosis Date  . Anxiety   . Arthritis   . Cancer ([place])    skin cancer removed Left shoulder Basal cell  . Depression   . GERD (gastroesophageal reflux disease)   . Heart murmur    as a child  . History of hiatal hernia   . Hypertension   . Pneumonia   . 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[facility])     Past Surgical History:  Procedure Laterality Date  . COLONOSCOPY N/A 04/09/2016   Procedure: COLONOSCOPY;  Surgeon: Rogene Houston, MD;  [facility];  Service: Endoscopy;  Laterality: N/A;  1:45  . JOINT REPLACEMENT     L hip Dr Alvan Dame 03/24/17  . left elbow     spur removed  . left knee surgery     open surgery for a meniscus tear 1970's  . POLYPECTOMY  04/09/2016   Procedure: POLYPECTOMY;  Surgeon: Rogene Houston, MD;  [facility];  Service: Endoscopy;;  cecal polyp  . rt knee arthroscopy    . 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   Procedure: LEFT TOTAL HIP ARTHROPLASTY ANTERIOR APPROACH;  Surgeon: Paralee Cancel, MD;  Location: [facility];  Service: Orthopedics;  Laterality: Left;  70 mins    Allergies  Allergen Reactions  . Erythromycin Nausea Only    Current Outpatient Medications on File Prior to  Visit  Medication Sig Dispense Refill  . amLODipine-valsartan (EXFORGE) 10-320 MG tablet Take 1 tablet by mouth daily.    . cetirizine (ZYRTEC) 10 MG tablet Take 10 mg by mouth daily.    Marland Kitchen escitalopram (LEXAPRO) 20 MG tablet Take 20 mg by mouth daily.     Marland Kitchen omeprazole (PRILOSEC) 20 MG capsule Take 20 mg by mouth 2 (two) times daily before a meal.      No current facility-administered medications on file prior to visit.      Allergies  Allergen Reactions  . Erythromycin Nausea Only          Objective:   Physical Exam deferred        Assessment & Plan:  UC. He is doing well.  Will see back in a year.  Total time 15 minutes.

## 2018-08-26 ENCOUNTER — Telehealth (INDEPENDENT_AMBULATORY_CARE_PROVIDER_SITE_OTHER): Payer: Self-pay | Admitting: Internal Medicine

## 2018-08-26 NOTE — Telephone Encounter (Signed)
Patient left message for you to call him - ph# [phone]

## 2018-08-26 NOTE — Telephone Encounter (Signed)
Get an OTC stool softener and continue the miralax

## 2019-03-08 ENCOUNTER — Other Ambulatory Visit (HOSPITAL_COMMUNITY): Payer: Self-pay | Admitting: Orthopedic Surgery

## 2019-03-08 DIAGNOSIS — Z96642 Presence of left artificial hip joint: Secondary | ICD-10-CM

## 2019-03-08 DIAGNOSIS — M25552 Pain in left hip: Secondary | ICD-10-CM

## 2019-03-16 ENCOUNTER — Encounter (HOSPITAL_COMMUNITY)
Admission: RE | Admit: 2019-03-16 | Discharge: 2019-03-16 | Disposition: A | Discharge status: Home / Self Care | Payer: Medicare Other | Source: Ambulatory Visit | Attending: Orthopedic Surgery | Admitting: Orthopedic Surgery

## 2019-03-16 ENCOUNTER — Other Ambulatory Visit: Payer: Self-pay

## 2019-03-16 DIAGNOSIS — M25552 Pain in left hip: Secondary | ICD-10-CM | POA: Insufficient documentation

## 2019-03-16 DIAGNOSIS — Z96642 Presence of left artificial hip joint: Secondary | ICD-10-CM | POA: Insufficient documentation

## 2019-03-16 MED ORDER — TECHNETIUM TC 99M MEBROFENIN IV KIT
5.0000 | PACK | Freq: Once | INTRAVENOUS | Status: AC | PRN
  Administered 2019-03-16: 5 via INTRAVENOUS

## 2019-08-01 NOTE — H&P (Signed)
TOTAL KNEE ADMISSION H&P  Patient is being admitted for left total knee arthroplasty.  Subjective:  Chief Complaint:  Left knee primary OA / pain  HPI: Herbert Morrison, 74 y.o. male, has a history of pain and functional disability in the left knee due to arthritis and has failed non-surgical conservative treatments for greater than 12 weeks to include NSAID's and/or analgesics and activity modification.  Onset of symptoms was gradual, starting 10 years ago with gradually worsening course since that time. The patient noted prior procedures on the knee to include  menisectomy on the left knee(s).  Patient currently rates pain in the left knee(s) at 10 out of 10 with activity. Patient has night pain, worsening of pain with activity and weight bearing, pain that interferes with activities of daily living, pain with passive range of motion, crepitus and joint swelling.  Patient has evidence of periarticular osteophytes and joint space narrowing by imaging studies. There is no active infection.  Risks, benefits and expectations were discussed with the patient.  Risks including but not limited to the risk of anesthesia, blood clots, nerve damage, blood vessel damage, failure of the prosthesis, infection and up to and including death.  Patient understand the risks, benefits and expectations and wishes to proceed with surgery.   PCP: Roderic Scarce, MD  D/C Plans:       Home   Post-op Meds:       No Rx given   Tranexamic Acid:      To be given - IV   Decadron:      Is to be given  FYI:      ASA  [place]  DME:   Pt already has equipment  PT:   [place]: [place], [place]    Patient Active Problem List   Diagnosis Date Noted  . Overweight (BMI 25.0-29.9) 03/25/2017  . 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 GERD (gastroesophageal reflux disease) 01/29/2016  . 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([place]) 07/17/2014  . Essential  hypertension 07/17/2014   Past Medical History:  Diagnosis Date  . Anxiety   . Arthritis   . Cancer ([place])    skin cancer removed Left shoulder Basal cell  . Depression   . GERD (gastroesophageal reflux disease)   . Heart murmur    as a child  . History of hiatal hernia   . Hypertension   . Pneumonia   . 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[facility])     Past Surgical History:  Procedure Laterality Date  . COLONOSCOPY N/A 04/09/2016   Procedure: COLONOSCOPY;  Surgeon: Rogene Houston, MD;  [facility];  Service: Endoscopy;  Laterality: N/A;  1:45  . JOINT REPLACEMENT     L hip Dr Alvan Dame 03/24/17  . left elbow     spur removed  . left knee surgery     open surgery for a meniscus tear 1970's  . POLYPECTOMY  04/09/2016   Procedure: POLYPECTOMY;  Surgeon: Rogene Houston, MD;  [facility];  Service: Endoscopy;;  cecal polyp  . rt knee arthroscopy    . 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   Procedure: LEFT TOTAL HIP ARTHROPLASTY ANTERIOR APPROACH;  Surgeon: Paralee Cancel, MD;  [facility];  Service: Orthopedics;  Laterality: Left;  70 mins    No current facility-administered medications for this encounter.   Current Outpatient Medications  Medication Sig Dispense Refill Last Dose  .  amLODipine-valsartan (EXFORGE) 10-320 MG tablet Take 1 tablet by mouth daily.     . Bacillus Coagulans-Inulin (PROBIOTIC-PREBIOTIC PO) Take 2 tablets by mouth daily.     . cetirizine (ZYRTEC) 10 MG tablet Take 10 mg by mouth daily.     Marland Kitchen escitalopram (LEXAPRO) 20 MG tablet Take 20 mg by mouth daily.      . fluticasone (FLONASE) 50 MCG/ACT nasal spray Place 1 spray into the nose daily.     Marland Kitchen ibuprofen (ADVIL) 200 MG tablet Take 400 mg by mouth every 6 (six) hours as needed for mild pain or moderate pain.     Marland Kitchen omeprazole (PRILOSEC) 20 MG capsule Take 20 mg by mouth daily.      Marland Kitchen zinc gluconate 50 MG tablet Take 50 mg by mouth daily.      Allergies  Allergen Reactions  . Erythromycin  Nausea Only    Social History   Tobacco Use  . Smoking status: Former Research scientist (life sciences)  . Smokeless tobacco: Former Systems developer    Types: Chew    Quit date: 01/22/1965  . Tobacco comment: quit 43 years ago  Substance Use Topics  . Alcohol use: Yes    Alcohol/week: 0.0 standard drinks    Comment: occasionally       Review of Systems  Constitutional: Negative.   HENT: Negative.   Eyes: Negative.   Respiratory: Negative.   Cardiovascular: Negative.   Gastrointestinal: Positive for heartburn.  Genitourinary: Negative.   Musculoskeletal: Positive for joint pain.  Skin: Negative.   Neurological: Negative.   Endo/Heme/Allergies: Negative.      Objective:  Physical Exam  Constitutional: He is oriented to person, place, and time. He appears well-developed.  HENT:  Head: Normocephalic.  Eyes: Pupils are equal, round, and reactive to light.  Neck: No JVD present. No tracheal deviation present. No thyromegaly present.  Cardiovascular: Normal rate, regular rhythm and intact distal pulses.  Respiratory: Effort normal and breath sounds normal. No respiratory distress. He has no wheezes.  GI: Soft. There is no abdominal tenderness. There is no guarding.  Musculoskeletal:     Cervical back: Neck supple.     Left knee: Swelling and bony tenderness present. No deformity, erythema, ecchymosis or lacerations. Tenderness present.  Lymphadenopathy:    He has no cervical adenopathy.  Neurological: He is alert and oriented to person, place, and time.  Skin: Skin is warm and dry.  Psychiatric: He has a normal mood and affect.      Labs:  Estimated body mass index is 29.07 kg/m as calculated from the following:   Height as of 08/10/17: 5' 5.5" (1.664 m).   Weight as of 08/10/17: 80.5 kg.   Imaging Review Plain radiographs demonstrate severe degenerative joint disease of the left knee. The overall alignment is mild varus. The bone quality appears to be good for age and reported activity  level.      Assessment/Plan:  End stage arthritis, left knee   The patient history, physical examination, clinical judgment of the provider and imaging studies are consistent with end stage degenerative joint disease of the left knee and total knee arthroplasty is deemed medically necessary. The treatment options including medical management, injection therapy arthroscopy and arthroplasty were discussed at length. The risks and benefits of total knee arthroplasty were presented and reviewed. The risks due to aseptic loosening, infection, stiffness, patella tracking problems, thromboembolic complications and other imponderables were discussed. The patient acknowledged the explanation, agreed to proceed with the plan and consent was signed.  Patient is being admitted for inpatient treatment for surgery, pain control, PT, OT, prophylactic antibiotics, VTE prophylaxis, progressive ambulation and ADL's and discharge planning. The patient is planning to be discharged home.     Patient's anticipated LOS is less than 2 midnights, meeting these requirements: - Lives within 1 hour of care - Has a competent adult at home to recover with post-op recover - NO history of  - Chronic pain requiring opiods  - Diabetes  - Coronary Artery Disease  - Heart failure  - Heart attack  - Stroke  - DVT/VTE  - Cardiac arrhythmia  - Respiratory Failure/COPD  - Renal failure  - Anemia  - Advanced Liver disease     West Pugh. Sem Mccaughey   PA-C  08/01/2019, 10:55 AM

## 2019-08-03 NOTE — Patient Instructions (Addendum)
DUE TO COVID-19 ONLY TWO VISITORS ARE ALLOWED TO COME WITH YOU AND STAY IN THE WAITING ROOM ONLY DURING PRE OP AND PROCEDURE DAY OF SURGERY. THE 2 VISITORS MAY VISIT WITH YOU AFTER SURGERY IN YOUR PRIVATE ROOM DURING VISITING HOURS ONLY!  YOU NEED TO HAVE A COVID 19 TEST ON:08/08/19 @ 10:30 AM, THIS TEST MUST BE DONE BEFORE SURGERY, COME  Herbert Morrison, Herbert Morrison , [phone].  ([place]) ONCE YOUR COVID TEST IS COMPLETED, PLEASE BEGIN THE QUARANTINE INSTRUCTIONS AS OUTLINED IN YOUR HANDOUT.                Herbert Morrison    Your procedure is scheduled on: 08/11/19   Report to [facility] Main  Entrance   Report to SHORT STAY at: 5:30 AM     Call this number if you have problems the morning of surgery [phone]    Remember:   [place], NO CHEWING GUM [place].     Take these medicines the morning of surgery with A SIP OF WATER: AMLODIPINE,CETIRIZINE,LEXAPRO,OMEPRAZOLE. USE FLONASE AS NEEDED.                                 You may not have any metal on your body including hair pins and              piercings  Do not wear jewelry, lotions, powders or perfumes, deodorant             Men may shave face and neck.   Do not bring valuables to the hospital. [place].  Contacts, dentures or bridgework may not be worn into surgery.  Leave suitcase in the car. After surgery it may be brought to your room.     Patients discharged the day of surgery will not be allowed to drive home. IF YOU ARE HAVING SURGERY AND GOING HOME THE SAME DAY, YOU MUST HAVE AN ADULT TO DRIVE YOU HOME AND BE WITH YOU FOR 24 HOURS. YOU MAY GO HOME BY TAXI OR UBER OR ORTHERWISE, BUT AN ADULT MUST ACCOMPANY YOU HOME AND STAY WITH YOU FOR 24 HOURS.  Name and phone number of your driver:  Special Instructions: N/A              Please read over the following fact sheets you were  given: _____________________________________________________________________  NO SOLID FOOD AFTER MIDNIGHT THE NIGHT PRIOR TO SURGERY. NOTHING BY MOUTH EXCEPT CLEAR LIQUIDS UNTIL: 4:30 AM . PLEASE FINISH ENSURE DRINK PER SURGEON ORDER  WHICH NEEDS TO BE COMPLETED AT : 4:30 AM.   CLEAR LIQUID DIET   Foods Allowed                                                                     Foods Excluded  Coffee and tea, regular and decaf  liquids that you cannot  Plain Jell-O any favor except red or purple                                           see through such as: Fruit ices (not with fruit pulp)                                     milk, soups, orange juice  Iced Popsicles                                    All solid food Carbonated beverages, regular and diet                                    Cranberry, grape and apple juices Sports drinks like Gatorade Lightly seasoned clear broth or consume(fat free) Sugar, honey syrup  Sample Menu Breakfast                                Lunch                                     Supper Cranberry juice                    Beef broth                            Chicken broth Jell-O                                     Grape juice                           Apple juice Coffee or tea                        Jell-O                                      Popsicle                                                Coffee or tea                        Coffee or tea  _____________________________________________________________________  [facility] - Preparing for Surgery Before surgery, you can play an important role.  Because skin is not sterile, your skin needs to be as free of germs as possible.  You can reduce the number of germs on your skin by washing with CHG (chlorahexidine gluconate) soap before surgery.  CHG is an antiseptic cleaner which kills  germs and bonds with the skin to continue killing germs even after washing. Please DO NOT  use if you have an allergy to CHG or antibacterial soaps.  If your skin becomes reddened/irritated stop using the CHG and inform your nurse when you arrive at Short Stay. Do not shave (including legs and underarms) for at least 48 hours prior to the first CHG shower.  You may shave your face/neck. Please follow these instructions carefully:  1.  Shower with CHG Soap the night before surgery and the  morning of Surgery.  2.  If you choose to wash your hair, wash your hair first as usual with your  normal  shampoo.  3.  After you shampoo, rinse your hair and body thoroughly to remove the  shampoo.                           4.  Use CHG as you would any other liquid soap.  You can apply chg directly  to the skin and wash                       Gently with a scrungie or clean washcloth.  5.  Apply the CHG Soap to your body ONLY FROM THE NECK DOWN.   Do not use on face/ open                           Wound or open sores. Avoid contact with eyes, ears mouth and genitals (private parts).                       Wash face,  Genitals (private parts) with your normal soap.             6.  Wash thoroughly, paying special attention to the area where your surgery  will be performed.  7.  Thoroughly rinse your body with warm water from the neck down.  8.  DO NOT shower/wash with your normal soap after using and rinsing off  the CHG Soap.                9.  Pat yourself dry with a clean towel.            10.  Wear clean pajamas.            11.  Place clean sheets on your bed the night of your first shower and do not  sleep with pets. Day of Surgery : Do not apply any lotions/deodorants the morning of surgery.  Please wear clean clothes to the hospital/surgery center.  FAILURE TO FOLLOW THESE INSTRUCTIONS MAY RESULT IN THE CANCELLATION OF YOUR SURGERY PATIENT SIGNATURE_________________________________  NURSE  SIGNATURE__________________________________  ________________________________________________________________________   Herbert Morrison  An incentive spirometer is a tool that can help keep your lungs clear and active. This tool measures how well you are filling your lungs with each breath. Taking long deep breaths may help reverse or decrease the chance of developing breathing (pulmonary) problems (especially infection) following:  A long period of time when you are unable to move or be active. BEFORE THE PROCEDURE   If the spirometer includes an indicator to show your best effort, your nurse or respiratory therapist will set it to a desired goal.  If possible, sit up straight or lean slightly forward. Try not to slouch.  Hold the incentive spirometer in an  upright position. INSTRUCTIONS FOR USE  1. Sit on the edge of your bed if possible, or sit up as far as you can in bed or on a chair. 2. Hold the incentive spirometer in an upright position. 3. Breathe out normally. 4. Place the mouthpiece in your mouth and seal your lips tightly around it. 5. Breathe in slowly and as deeply as possible, raising the piston or the ball toward the top of the column. 6. Hold your breath for 3-5 seconds or for as long as possible. Allow the piston or ball to fall to the bottom of the column. 7. Remove the mouthpiece from your mouth and breathe out normally. 8. Rest for a few seconds and repeat Steps 1 through 7 at least 10 times every 1-2 hours when you are awake. Take your time and take a few normal breaths between deep breaths. 9. The spirometer may include an indicator to show your best effort. Use the indicator as a goal to work toward during each repetition. 10. After each set of 10 deep breaths, practice coughing to be sure your lungs are clear. If you have an incision (the cut made at the time of surgery), support your incision when coughing by placing a pillow or rolled up towels firmly  against it. Once you are able to get out of bed, walk around indoors and cough well. You may stop using the incentive spirometer when instructed by your caregiver.  RISKS AND COMPLICATIONS  Take your time so you do not get dizzy or light-headed.  If you are in pain, you may need to take or ask for pain medication before doing incentive spirometry. It is harder to take a deep breath if you are having pain. AFTER USE  Rest and breathe slowly and easily.  It can be helpful to keep track of a log of your progress. Your caregiver can provide you with a simple table to help with this. If you are using the spirometer at home, follow these instructions: [place] IF:   You are having difficultly using the spirometer.  You have trouble using the spirometer as often as instructed.  Your pain medication is not giving enough relief while using the spirometer.  You develop fever of 100.5 F (38.1 C) or higher. SEEK IMMEDIATE MEDICAL CARE IF:   You cough up bloody sputum that had not been present before.  You develop fever of 102 F (38.9 C) or greater.  You develop worsening pain at or near the incision site. MAKE SURE YOU:   Understand these instructions.  Will watch your condition.  Will get help right away if you are not doing well or get worse. Document Released: 09/08/2006 Document Revised: 07/21/2011 Document Reviewed: 11/09/2006 ExitCare Patient Information 2014 Exit[employer], [place].   ________________________________________________________________________  WHAT IS A BLOOD TRANSFUSION? Blood Transfusion Information  A transfusion is the replacement of blood or some of its parts. Blood is made up of multiple cells which provide different functions.  Red blood cells carry oxygen and are used for blood loss replacement.  White blood cells fight against infection.  Platelets control bleeding.  Plasma helps clot blood.  Other blood products are available for  specialized needs, such as hemophilia or other clotting disorders. BEFORE THE TRANSFUSION  Who gives blood for transfusions?   Healthy volunteers who are fully evaluated to make sure their blood is safe. This is blood bank blood. Transfusion therapy is the safest it has ever been in the practice of medicine.  Before blood is taken from a donor, a complete history is taken to make sure that person has no history of diseases nor engages in risky social behavior (examples are intravenous drug use or sexual activity with multiple partners). The donor's travel history is screened to minimize risk of transmitting infections, such as malaria. The donated blood is tested for signs of infectious diseases, such as HIV and hepatitis. The blood is then tested to be sure it is compatible with you in order to minimize the chance of a transfusion reaction. If you or a relative donates blood, this is often done in anticipation of surgery and is not appropriate for emergency situations. It takes many days to process the donated blood. RISKS AND COMPLICATIONS Although transfusion therapy is very safe and saves many lives, the main dangers of transfusion include:   Getting an infectious disease.  Developing a transfusion reaction. This is an allergic reaction to something in the blood you were given. Every precaution is taken to prevent this. The decision to have a blood transfusion has been considered carefully by your caregiver before blood is given. Blood is not given unless the benefits outweigh the risks. AFTER THE TRANSFUSION  Right after receiving a blood transfusion, you will usually feel much better and more energetic. This is especially true if your red blood cells have gotten low (anemic). The transfusion raises the level of the red blood cells which carry oxygen, and this usually causes an energy increase.  The nurse administering the transfusion will monitor you carefully for complications. HOME CARE  INSTRUCTIONS  No special instructions are needed after a transfusion. You may find your energy is better. Speak with your caregiver about any limitations on activity for underlying diseases you may have. SEEK MEDICAL CARE IF:   Your condition is not improving after your transfusion.  You develop redness or irritation at the intravenous (IV) site. SEEK IMMEDIATE MEDICAL CARE IF:  Any of the following symptoms occur over the next 12 hours:  Shaking chills.  You have a temperature by mouth above 102 F (38.9 C), not controlled by medicine.  Chest, back, or muscle pain.  People around you feel you are not acting correctly or are confused.  Shortness of breath or difficulty breathing.  Dizziness and fainting.  You get a rash or develop hives.  You have a decrease in urine output.  Your urine turns a dark color or changes to pink, red, or brown. Any of the following symptoms occur over the next 10 days:  You have a temperature by mouth above 102 F (38.9 C), not controlled by medicine.  Shortness of breath.  Weakness after normal activity.  The white part of the eye turns yellow (jaundice).  You have a decrease in the amount of urine or are urinating less often.  Your urine turns a dark color or changes to pink, red, or brown. Document Released: 04/25/2000 Document Revised: 07/21/2011 Document Reviewed: 12/13/2007 [facility] Patient Information 2014 [place], [place].  _______________________________________________________________________

## 2019-08-04 ENCOUNTER — Encounter (HOSPITAL_COMMUNITY): Payer: Self-pay

## 2019-08-04 ENCOUNTER — Encounter (HOSPITAL_COMMUNITY)
Admission: RE | Admit: 2019-08-04 | Discharge: 2019-08-04 | Disposition: A | Discharge status: Home / Self Care | Payer: Medicare Other | Source: Ambulatory Visit | Attending: Orthopedic Surgery | Admitting: Orthopedic Surgery

## 2019-08-04 ENCOUNTER — Other Ambulatory Visit: Payer: Self-pay

## 2019-08-04 DIAGNOSIS — Z01818 Encounter for other preprocedural examination: Secondary | ICD-10-CM | POA: Diagnosis not present

## 2019-08-08 ENCOUNTER — Other Ambulatory Visit (HOSPITAL_COMMUNITY)
Admission: RE | Admit: 2019-08-08 | Discharge: 2019-08-08 | Disposition: A | Discharge status: Home / Self Care | Payer: Medicare Other | Source: Ambulatory Visit | Attending: Orthopedic Surgery | Admitting: Orthopedic Surgery

## 2019-08-08 ENCOUNTER — Encounter (HOSPITAL_COMMUNITY)
Admission: RE | Admit: 2019-08-08 | Discharge: 2019-08-08 | Disposition: A | Discharge status: Home / Self Care | Payer: Medicare Other | Source: Ambulatory Visit | Attending: Orthopedic Surgery | Admitting: Orthopedic Surgery

## 2019-08-08 ENCOUNTER — Other Ambulatory Visit: Payer: Self-pay

## 2019-08-08 DIAGNOSIS — Z01812 Encounter for preprocedural laboratory examination: Secondary | ICD-10-CM | POA: Insufficient documentation

## 2019-08-08 DIAGNOSIS — Z20822 Contact with and (suspected) exposure to covid-19: Secondary | ICD-10-CM | POA: Diagnosis not present

## 2019-08-08 LAB — BASIC METABOLIC PANEL
Anion gap: 8 (ref 5–15)
BUN: 14 mg/dL (ref 8–23)
CO2: 23 mmol/L (ref 22–32)
Calcium: 9.2 mg/dL (ref 8.9–10.3)
Chloride: 109 mmol/L (ref 98–111)
Creatinine, Ser: 1.12 mg/dL (ref 0.61–1.24)
GFR calc Af Amer: >60 mL/min (ref >60)
GFR calc non Af Amer: >60 mL/min (ref >60)
Glucose, Bld: 106 mg/dL — ABNORMAL HIGH (ref 70–99)
Potassium: 4.3 mmol/L (ref 3.5–5.1)
Sodium: 140 mmol/L (ref 135–145)

## 2019-08-08 LAB — APTT: aPTT: 28 seconds (ref 24–36)

## 2019-08-08 LAB — CBC WITH DIFFERENTIAL/PLATELET
Abs Immature Granulocytes: 0.02 10*3/uL (ref 0.00–0.07)
Basophils Absolute: 0.1 10*3/uL (ref 0.0–0.1)
Basophils Relative: 1 %
Eosinophils Absolute: 0.3 10*3/uL (ref 0.0–0.5)
Eosinophils Relative: 6 %
HCT: 49.3 % (ref 39.0–52.0)
Hemoglobin: 15.6 g/dL (ref 13.0–17.0)
Immature Granulocytes: 0 %
Lymphocytes Relative: 26 %
Lymphs Abs: 1.5 10*3/uL (ref 0.7–4.0)
MCH: 28 pg (ref 26.0–34.0)
MCHC: 31.6 g/dL (ref 30.0–36.0)
MCV: 88.4 fL (ref 80.0–100.0)
Monocytes Absolute: 0.7 10*3/uL (ref 0.1–1.0)
Monocytes Relative: 12 %
Neutro Abs: 3.2 10*3/uL (ref 1.7–7.7)
Neutrophils Relative %: 55 %
Platelets: 299 10*3/uL (ref 150–400)
RBC: 5.58 MIL/uL (ref 4.22–5.81)
RDW: 13.7 % (ref 11.5–15.5)
WBC: 5.8 10*3/uL (ref 4.0–10.5)
nRBC: 0 % (ref 0.0–0.2)

## 2019-08-08 LAB — PROTIME-INR
INR: 1 (ref 0.8–1.2)
Prothrombin Time: 13.3 seconds (ref 11.4–15.2)

## 2019-08-08 LAB — SARS CORONAVIRUS 2 (TAT 6-24 HRS): SARS Coronavirus 2: NEGATIVE

## 2019-08-08 LAB — SURGICAL PCR SCREEN
MRSA, PCR: NEGATIVE
Staphylococcus aureus: NEGATIVE

## 2019-08-10 NOTE — Anesthesia Preprocedure Evaluation (Addendum)
Anesthesia Evaluation  Patient identified by MRN, date of birth, ID band Patient awake    Reviewed: Allergy & Precautions, NPO status , Patient's Chart, lab work & pertinent test results  Airway Mallampati: I  TM Distance: >3 FB Neck ROM: Full    Dental  (+) Teeth Intact, Dental Advisory Given   Pulmonary former smoker,  Quit smoking 1966   breath sounds clear to auscultation       Cardiovascular hypertension, Pt. on medications + Valvular Problems/Murmurs  Rhythm:Regular Rate:Normal     Neuro/Psych PSYCHIATRIC DISORDERS Anxiety Depression negative neurological ROS     GI/Hepatic Neg liver ROS, hiatal hernia, PUD, GERD  Medicated and Controlled,Ulcerative colitis   Endo/Other  Obesity BMI 31  Renal/GU negative Renal ROS  negative genitourinary   Musculoskeletal  (+) Arthritis , Osteoarthritis,    Abdominal   Peds  Hematology negative hematology ROS (+)   Anesthesia Other Findings   Reproductive/Obstetrics negative OB ROS                            Anesthesia Physical Anesthesia Plan  ASA: II  Anesthesia Plan: Spinal, MAC and Regional   Post-op Pain Management:  Regional for Post-op pain   Induction:   PONV Risk Score and Plan: Propofol infusion, TIVA and Treatment may vary due to age or medical condition  Airway Management Planned: Natural Airway and Nasal Cannula  Additional Equipment: None  Intra-op Plan:   Post-operative Plan:   Informed Consent: I have reviewed the patients History and Physical, chart, labs and discussed the procedure including the risks, benefits and alternatives for the proposed anesthesia with the patient or authorized representative who has indicated his/her understanding and acceptance.       Plan Discussed with: CRNA  Anesthesia Plan Comments:         Anesthesia Quick Evaluation

## 2019-08-11 ENCOUNTER — Encounter (HOSPITAL_COMMUNITY)
Admission: RE | Disposition: A | Discharge status: Home / Self Care | Payer: Self-pay | Source: Ambulatory Visit | Attending: Orthopedic Surgery

## 2019-08-11 ENCOUNTER — Encounter (HOSPITAL_COMMUNITY): Payer: Self-pay | Admitting: Orthopedic Surgery

## 2019-08-11 ENCOUNTER — Other Ambulatory Visit: Payer: Self-pay

## 2019-08-11 ENCOUNTER — Ambulatory Visit (HOSPITAL_COMMUNITY): Payer: Medicare Other | Admitting: Certified Registered"

## 2019-08-11 ENCOUNTER — Observation Stay (HOSPITAL_COMMUNITY)
Admission: RE | Admit: 2019-08-11 | Discharge: 2019-08-12 | Disposition: A | Discharge status: Home / Self Care | Payer: Medicare Other | Source: Ambulatory Visit | Attending: Orthopedic Surgery | Admitting: Orthopedic Surgery

## 2019-08-11 ENCOUNTER — Ambulatory Visit (INDEPENDENT_AMBULATORY_CARE_PROVIDER_SITE_OTHER): Payer: Medicare Other | Admitting: Nurse Practitioner

## 2019-08-11 DIAGNOSIS — K219 Gastro-esophageal reflux disease without esophagitis: Secondary | ICD-10-CM | POA: Diagnosis not present

## 2019-08-11 DIAGNOSIS — Z79899 Other long term (current) drug therapy: Secondary | ICD-10-CM | POA: Diagnosis not present

## 2019-08-11 DIAGNOSIS — I10 Essential (primary) hypertension: Secondary | ICD-10-CM | POA: Diagnosis not present

## 2019-08-11 DIAGNOSIS — Z87891 Personal history of nicotine dependence: Secondary | ICD-10-CM | POA: Diagnosis not present

## 2019-08-11 DIAGNOSIS — F329 Major depressive disorder, single episode, unspecified: Secondary | ICD-10-CM | POA: Insufficient documentation

## 2019-08-11 DIAGNOSIS — Z96642 Presence of left artificial hip joint: Secondary | ICD-10-CM | POA: Insufficient documentation

## 2019-08-11 DIAGNOSIS — M1712 Unilateral primary osteoarthritis, left knee: Principal | ICD-10-CM | POA: Insufficient documentation

## 2019-08-11 DIAGNOSIS — Z96659 Presence of unspecified artificial knee joint: Secondary | ICD-10-CM | POA: Insufficient documentation

## 2019-08-11 DIAGNOSIS — Z881 Allergy status to other antibiotic agents status: Secondary | ICD-10-CM | POA: Diagnosis not present

## 2019-08-11 DIAGNOSIS — Z96652 Presence of left artificial knee joint: Secondary | ICD-10-CM

## 2019-08-11 HISTORY — PX: TOTAL KNEE ARTHROPLASTY: SHX125

## 2019-08-11 LAB — TYPE AND SCREEN
ABO/RH(D): A POS
Antibody Screen: NEGATIVE

## 2019-08-11 SURGERY — ARTHROPLASTY, KNEE, TOTAL
Anesthesia: Monitor Anesthesia Care | Site: Knee | Laterality: Left

## 2019-08-11 MED ORDER — LIDOCAINE HCL (PF) 2 % IJ SOLN
INTRAMUSCULAR | Status: DC | PRN
  Administered 2019-08-11: 20 mg via INTRADERMAL

## 2019-08-11 MED ORDER — DEXAMETHASONE SODIUM PHOSPHATE 10 MG/ML IJ SOLN: 10.0000 mg | Freq: Once | INTRAMUSCULAR | Status: DC

## 2019-08-11 MED ORDER — FERROUS SULFATE 325 (65 FE) MG PO TABS
325.0000 mg | ORAL_TABLET | Freq: Two times a day (BID) | ORAL | Status: DC
  Administered 2019-08-11: 325 mg via ORAL
  Filled 2019-08-11: qty 1

## 2019-08-11 MED ORDER — FENTANYL CITRATE (PF) 100 MCG/2ML IJ SOLN
INTRAMUSCULAR | Status: AC
  Filled 2019-08-11: qty 2

## 2019-08-11 MED ORDER — IRBESARTAN 150 MG PO TABS: 300.0000 mg | ORAL_TABLET | Freq: Every day | ORAL | Status: DC

## 2019-08-11 MED ORDER — FENTANYL CITRATE (PF) 100 MCG/2ML IJ SOLN
INTRAMUSCULAR | Status: DC | PRN
  Administered 2019-08-11: 50 ug via INTRAVENOUS

## 2019-08-11 MED ORDER — AMLODIPINE BESYLATE-VALSARTAN 10-320 MG PO TABS: 1.0000 | ORAL_TABLET | Freq: Every day | ORAL | Status: DC

## 2019-08-11 MED ORDER — METHOCARBAMOL 500 MG PO TABS
500.0000 mg | ORAL_TABLET | Freq: Four times a day (QID) | ORAL | Status: DC | PRN
  Administered 2019-08-11 – 2019-08-12 (×2): 500 mg via ORAL
  Filled 2019-08-11 (×2): qty 1

## 2019-08-11 MED ORDER — ALUM & MAG HYDROXIDE-SIMETH 200-200-20 MG/5ML PO SUSP: 15.0000 mL | ORAL | Status: DC | PRN

## 2019-08-11 MED ORDER — PROPOFOL 1000 MG/100ML IV EMUL
INTRAVENOUS | Status: AC
  Filled 2019-08-11: qty 100

## 2019-08-11 MED ORDER — FLUTICASONE PROPIONATE 50 MCG/ACT NA SUSP
1.0000 | Freq: Every day | NASAL | Status: DC
  Filled 2019-08-11: qty 16

## 2019-08-11 MED ORDER — KETOROLAC TROMETHAMINE 30 MG/ML IJ SOLN
INTRAMUSCULAR | Status: AC
  Filled 2019-08-11: qty 1

## 2019-08-11 MED ORDER — OXYCODONE HCL 5 MG PO TABS: 5.0000 mg | ORAL_TABLET | Freq: Once | ORAL | Status: DC | PRN

## 2019-08-11 MED ORDER — ACETAMINOPHEN 500 MG PO TABS
1000.0000 mg | ORAL_TABLET | Freq: Once | ORAL | Status: AC
  Administered 2019-08-11: 1000 mg via ORAL
  Filled 2019-08-11: qty 2

## 2019-08-11 MED ORDER — HYDROCODONE-ACETAMINOPHEN 5-325 MG PO TABS
1.0000 | ORAL_TABLET | ORAL | Status: DC | PRN
  Administered 2019-08-11 – 2019-08-12 (×4): 1 via ORAL
  Filled 2019-08-11 (×4): qty 1

## 2019-08-11 MED ORDER — PROPOFOL 500 MG/50ML IV EMUL
INTRAVENOUS | Status: DC | PRN
  Administered 2019-08-11: 50 ug/kg/min via INTRAVENOUS

## 2019-08-11 MED ORDER — HYDROCODONE-ACETAMINOPHEN 7.5-325 MG PO TABS
1.0000 | ORAL_TABLET | ORAL | Status: DC | PRN
  Administered 2019-08-12: 1 via ORAL
  Filled 2019-08-11: qty 1

## 2019-08-11 MED ORDER — MORPHINE SULFATE (PF) 4 MG/ML IV SOLN: 0.5000 mg | INTRAVENOUS | Status: DC | PRN

## 2019-08-11 MED ORDER — MAGNESIUM CITRATE PO SOLN: 1.0000 | Freq: Once | ORAL | Status: DC | PRN

## 2019-08-11 MED ORDER — ACETAMINOPHEN 325 MG PO TABS: 325.0000 mg | ORAL_TABLET | Freq: Four times a day (QID) | ORAL | Status: DC | PRN

## 2019-08-11 MED ORDER — AMLODIPINE BESYLATE 10 MG PO TABS: 10.0000 mg | ORAL_TABLET | Freq: Every day | ORAL | Status: DC

## 2019-08-11 MED ORDER — SODIUM CHLORIDE (PF) 0.9 % IJ SOLN
INTRAMUSCULAR | Status: AC
  Filled 2019-08-11: qty 50

## 2019-08-11 MED ORDER — BUPIVACAINE HCL (PF) 0.25 % IJ SOLN
INTRAMUSCULAR | Status: DC | PRN
  Administered 2019-08-11: 30 mL

## 2019-08-11 MED ORDER — CHLORHEXIDINE GLUCONATE 4 % EX LIQD: 60.0000 mL | Freq: Once | CUTANEOUS | Status: DC

## 2019-08-11 MED ORDER — PROPOFOL 10 MG/ML IV BOLUS
INTRAVENOUS | Status: AC
  Filled 2019-08-11: qty 20

## 2019-08-11 MED ORDER — SODIUM CHLORIDE 0.9 % IV SOLN: INTRAVENOUS | Status: DC

## 2019-08-11 MED ORDER — ESCITALOPRAM OXALATE 20 MG PO TABS: 20.0000 mg | ORAL_TABLET | Freq: Every day | ORAL | Status: DC

## 2019-08-11 MED ORDER — DEXAMETHASONE SODIUM PHOSPHATE 10 MG/ML IJ SOLN
10.0000 mg | Freq: Once | INTRAMUSCULAR | Status: AC
  Administered 2019-08-12: 10 mg via INTRAVENOUS
  Filled 2019-08-11: qty 1

## 2019-08-11 MED ORDER — ROPIVACAINE HCL 5 MG/ML IJ SOLN
INTRAMUSCULAR | Status: DC | PRN
  Administered 2019-08-11: 30 mL via PERINEURAL

## 2019-08-11 MED ORDER — MIDAZOLAM HCL 2 MG/2ML IJ SOLN
INTRAMUSCULAR | Status: DC | PRN
  Administered 2019-08-11: 2 mg via INTRAVENOUS

## 2019-08-11 MED ORDER — ASPIRIN 81 MG PO CHEW
81.0000 mg | CHEWABLE_TABLET | Freq: Two times a day (BID) | ORAL | Status: DC
  Administered 2019-08-11 – 2019-08-12 (×2): 81 mg via ORAL
  Filled 2019-08-11 (×2): qty 1

## 2019-08-11 MED ORDER — LORATADINE 10 MG PO TABS: 10.0000 mg | ORAL_TABLET | Freq: Every day | ORAL | Status: DC

## 2019-08-11 MED ORDER — PROPOFOL 10 MG/ML IV BOLUS
INTRAVENOUS | Status: DC | PRN
  Administered 2019-08-11: 20 mg via INTRAVENOUS

## 2019-08-11 MED ORDER — DEXAMETHASONE SODIUM PHOSPHATE 10 MG/ML IJ SOLN
INTRAMUSCULAR | Status: AC
  Filled 2019-08-11: qty 1

## 2019-08-11 MED ORDER — ONDANSETRON HCL 4 MG/2ML IJ SOLN: 4.0000 mg | Freq: Four times a day (QID) | INTRAMUSCULAR | Status: DC | PRN

## 2019-08-11 MED ORDER — DIPHENHYDRAMINE HCL 12.5 MG/5ML PO ELIX: 12.5000 mg | ORAL_SOLUTION | ORAL | Status: DC | PRN

## 2019-08-11 MED ORDER — OXYCODONE HCL 5 MG/5ML PO SOLN: 5.0000 mg | Freq: Once | ORAL | Status: DC | PRN

## 2019-08-11 MED ORDER — BUPIVACAINE IN DEXTROSE 0.75-8.25 % IT SOLN
INTRATHECAL | Status: DC | PRN
  Administered 2019-08-11: 1.6 mL via INTRATHECAL

## 2019-08-11 MED ORDER — SODIUM CHLORIDE 0.9 % IR SOLN
Status: DC | PRN
  Administered 2019-08-11: 1000 mL

## 2019-08-11 MED ORDER — METOCLOPRAMIDE HCL 5 MG/ML IJ SOLN: 5.0000 mg | Freq: Three times a day (TID) | INTRAMUSCULAR | Status: DC | PRN

## 2019-08-11 MED ORDER — ONDANSETRON HCL 4 MG/2ML IJ SOLN
INTRAMUSCULAR | Status: DC | PRN
  Administered 2019-08-11: 4 mg via INTRAVENOUS

## 2019-08-11 MED ORDER — TRANEXAMIC ACID-NACL 1000-0.7 MG/100ML-% IV SOLN
1000.0000 mg | Freq: Once | INTRAVENOUS | Status: AC
  Administered 2019-08-11: 1000 mg via INTRAVENOUS
  Filled 2019-08-11: qty 100

## 2019-08-11 MED ORDER — POVIDONE-IODINE 10 % EX SWAB
2.0000 "application " | Freq: Once | CUTANEOUS | Status: AC
  Administered 2019-08-11: 2 via TOPICAL

## 2019-08-11 MED ORDER — PANTOPRAZOLE SODIUM 40 MG PO TBEC: 40.0000 mg | DELAYED_RELEASE_TABLET | Freq: Every day | ORAL | Status: DC

## 2019-08-11 MED ORDER — METHOCARBAMOL 500 MG IVPB - SIMPLE MED
500.0000 mg | Freq: Four times a day (QID) | INTRAVENOUS | Status: DC | PRN
  Filled 2019-08-11: qty 50

## 2019-08-11 MED ORDER — DEXAMETHASONE SODIUM PHOSPHATE 10 MG/ML IJ SOLN
INTRAMUSCULAR | Status: DC | PRN
  Administered 2019-08-11: 10 mg
  Administered 2019-08-11: 8 mg

## 2019-08-11 MED ORDER — CEFAZOLIN SODIUM-DEXTROSE 2-4 GM/100ML-% IV SOLN
2.0000 g | INTRAVENOUS | Status: AC
  Administered 2019-08-11: 2 g via INTRAVENOUS
  Filled 2019-08-11: qty 100

## 2019-08-11 MED ORDER — ONDANSETRON HCL 4 MG/2ML IJ SOLN
INTRAMUSCULAR | Status: AC
  Filled 2019-08-11: qty 2

## 2019-08-11 MED ORDER — STERILE WATER FOR IRRIGATION IR SOLN
Status: DC | PRN
  Administered 2019-08-11: 2000 mL

## 2019-08-11 MED ORDER — METOCLOPRAMIDE HCL 5 MG PO TABS: 5.0000 mg | ORAL_TABLET | Freq: Three times a day (TID) | ORAL | Status: DC | PRN

## 2019-08-11 MED ORDER — KETOROLAC TROMETHAMINE 30 MG/ML IJ SOLN
INTRAMUSCULAR | Status: DC | PRN
  Administered 2019-08-11: 30 mg

## 2019-08-11 MED ORDER — DOCUSATE SODIUM 100 MG PO CAPS
100.0000 mg | ORAL_CAPSULE | Freq: Two times a day (BID) | ORAL | Status: DC
  Administered 2019-08-11 – 2019-08-12 (×2): 100 mg via ORAL
  Filled 2019-08-11 (×2): qty 1

## 2019-08-11 MED ORDER — ONDANSETRON HCL 4 MG PO TABS: 4.0000 mg | ORAL_TABLET | Freq: Four times a day (QID) | ORAL | Status: DC | PRN

## 2019-08-11 MED ORDER — HYDROMORPHONE HCL 1 MG/ML IJ SOLN: 0.2500 mg | INTRAMUSCULAR | Status: DC | PRN

## 2019-08-11 MED ORDER — POLYETHYLENE GLYCOL 3350 17 G PO PACK
17.0000 g | PACK | Freq: Two times a day (BID) | ORAL | Status: DC
  Administered 2019-08-11: 17 g via ORAL
  Filled 2019-08-11: qty 1

## 2019-08-11 MED ORDER — SODIUM CHLORIDE (PF) 0.9 % IJ SOLN
INTRAMUSCULAR | Status: DC | PRN
  Administered 2019-08-11: 50 mL

## 2019-08-11 MED ORDER — MENTHOL 3 MG MT LOZG: 1.0000 | LOZENGE | OROMUCOSAL | Status: DC | PRN

## 2019-08-11 MED ORDER — TRANEXAMIC ACID-NACL 1000-0.7 MG/100ML-% IV SOLN
1000.0000 mg | INTRAVENOUS | Status: AC
  Administered 2019-08-11: 1000 mg via INTRAVENOUS
  Filled 2019-08-11: qty 100

## 2019-08-11 MED ORDER — BUPIVACAINE HCL 0.25 % IJ SOLN
INTRAMUSCULAR | Status: AC
  Filled 2019-08-11: qty 1

## 2019-08-11 MED ORDER — PHENOL 1.4 % MT LIQD: 1.0000 | OROMUCOSAL | Status: DC | PRN

## 2019-08-11 MED ORDER — LACTATED RINGERS IV SOLN
INTRAVENOUS | Status: DC
  Administered 2019-08-11: 1000 mL via INTRAVENOUS

## 2019-08-11 MED ORDER — CEFAZOLIN SODIUM-DEXTROSE 2-4 GM/100ML-% IV SOLN
2.0000 g | Freq: Four times a day (QID) | INTRAVENOUS | Status: AC
  Administered 2019-08-11 (×2): 2 g via INTRAVENOUS
  Filled 2019-08-11 (×2): qty 100

## 2019-08-11 MED ORDER — BISACODYL 10 MG RE SUPP: 10.0000 mg | Freq: Every day | RECTAL | Status: DC | PRN

## 2019-08-11 MED ORDER — CELECOXIB 200 MG PO CAPS
200.0000 mg | ORAL_CAPSULE | Freq: Two times a day (BID) | ORAL | Status: DC
  Administered 2019-08-11 – 2019-08-12 (×3): 200 mg via ORAL
  Filled 2019-08-11 (×3): qty 1

## 2019-08-11 MED ORDER — MIDAZOLAM HCL 2 MG/2ML IJ SOLN
INTRAMUSCULAR | Status: AC
  Filled 2019-08-11: qty 2

## 2019-08-11 SURGICAL SUPPLY — 60 items
ATTUNE MED ANAT PAT 38 KNEE (Knees) ×1 IMPLANT
ATTUNE PS FEM LT SZ 6 CEM KNEE (Femur) ×1 IMPLANT
ATTUNE PSRP INSR SZ6 6 KNEE (Insert) ×1 IMPLANT
BAG ZIPLOCK 12X15 (MISCELLANEOUS) IMPLANT
BASE TIBIAL ROT PLAT SZ 7 KNEE (Knees) IMPLANT
BLADE SAW SGTL 11.0X1.19X90.0M (BLADE) IMPLANT
BLADE SAW SGTL 13.0X1.19X90.0M (BLADE) ×2 IMPLANT
BLADE SURG SZ10 CARB STEEL (BLADE) ×4 IMPLANT
BNDG ELASTIC 6X5.8 VLCR STR LF (GAUZE/BANDAGES/DRESSINGS) ×2 IMPLANT
BOWL SMART MIX CTS (DISPOSABLE) ×2 IMPLANT
CEMENT HV SMART SET (Cement) ×2 IMPLANT
COVER SURGICAL LIGHT HANDLE (MISCELLANEOUS) ×2 IMPLANT
COVER WAND RF STERILE (DRAPES) IMPLANT
CUFF TOURN SGL QUICK 34 (TOURNIQUET CUFF) ×1
CUFF TRNQT CYL 34X4.125X (TOURNIQUET CUFF) ×1 IMPLANT
DECANTER SPIKE VIAL GLASS SM (MISCELLANEOUS) ×4 IMPLANT
DERMABOND ADVANCED (GAUZE/BANDAGES/DRESSINGS) ×1
DERMABOND ADVANCED .7 DNX12 (GAUZE/BANDAGES/DRESSINGS) ×1 IMPLANT
DRAPE U-SHAPE 47X51 STRL (DRAPES) ×2 IMPLANT
DRESSING AQUACEL AG SP 3.5X10 (GAUZE/BANDAGES/DRESSINGS) ×1 IMPLANT
DRSG AQUACEL AG SP 3.5X10 (GAUZE/BANDAGES/DRESSINGS) ×2
DURAPREP 26ML APPLICATOR (WOUND CARE) ×4 IMPLANT
ELECT REM PT RETURN 15FT ADLT (MISCELLANEOUS) ×2 IMPLANT
GLOVE BIO SURGEON STRL SZ 6 (GLOVE) ×2 IMPLANT
GLOVE BIOGEL PI IND STRL 6.5 (GLOVE) ×1 IMPLANT
GLOVE BIOGEL PI IND STRL 7.5 (GLOVE) ×1 IMPLANT
GLOVE BIOGEL PI IND STRL 8.5 (GLOVE) ×1 IMPLANT
GLOVE BIOGEL PI INDICATOR 6.5 (GLOVE) ×1
GLOVE BIOGEL PI INDICATOR 7.5 (GLOVE) ×1
GLOVE BIOGEL PI INDICATOR 8.5 (GLOVE)
GLOVE ECLIPSE 8.0 STRL XLNG CF (GLOVE) ×1 IMPLANT
GLOVE ORTHO TXT STRL SZ7.5 (GLOVE) ×2 IMPLANT
GOWN STRL REUS W/ TWL LRG LVL3 (GOWN DISPOSABLE) ×1 IMPLANT
GOWN STRL REUS W/TWL 2XL LVL3 (GOWN DISPOSABLE) ×1 IMPLANT
GOWN STRL REUS W/TWL LRG LVL3 (GOWN DISPOSABLE) ×3 IMPLANT
HANDPIECE INTERPULSE COAX TIP (DISPOSABLE) ×1
HOLDER FOLEY CATH W/STRAP (MISCELLANEOUS) IMPLANT
KIT TURNOVER KIT A (KITS) ×1 IMPLANT
MANIFOLD NEPTUNE II (INSTRUMENTS) ×2 IMPLANT
NDL SAFETY ECLIPSE 18X1.5 (NEEDLE) IMPLANT
NEEDLE HYPO 18GX1.5 SHARP (NEEDLE)
NS IRRIG 1000ML POUR BTL (IV SOLUTION) ×2 IMPLANT
PACK TOTAL KNEE CUSTOM (KITS) ×2 IMPLANT
PENCIL SMOKE EVACUATOR (MISCELLANEOUS) ×1 IMPLANT
PIN DRILL FIX HALF THREAD (BIT) ×1 IMPLANT
PIN FIX SIGMA LCS THRD HI (PIN) ×1 IMPLANT
PROTECTOR NERVE ULNAR (MISCELLANEOUS) ×2 IMPLANT
SET HNDPC FAN SPRY TIP SCT (DISPOSABLE) ×1 IMPLANT
SET PAD KNEE POSITIONER (MISCELLANEOUS) ×2 IMPLANT
SUT MNCRL AB 4-0 PS2 18 (SUTURE) ×2 IMPLANT
SUT STRATAFIX PDS+ 0 24IN (SUTURE) ×2 IMPLANT
SUT VIC AB 1 CT1 36 (SUTURE) ×2 IMPLANT
SUT VIC AB 2-0 CT1 27 (SUTURE) ×3
SUT VIC AB 2-0 CT1 TAPERPNT 27 (SUTURE) ×3 IMPLANT
SYR 3ML LL SCALE MARK (SYRINGE) ×2 IMPLANT
TIBIAL BASE ROT PLAT SZ 7 KNEE (Knees) ×2 IMPLANT
TRAY FOLEY MTR SLVR 16FR STAT (SET/KITS/TRAYS/PACK) ×2 IMPLANT
WATER STERILE IRR 1000ML POUR (IV SOLUTION) ×4 IMPLANT
WRAP KNEE MAXI GEL POST OP (GAUZE/BANDAGES/DRESSINGS) ×2 IMPLANT
YANKAUER SUCT BULB TIP 10FT TU (MISCELLANEOUS) ×2 IMPLANT

## 2019-08-11 NOTE — Anesthesia Procedure Notes (Signed)
Procedure Name: MAC Date/Time: 08/11/2019 7:44 AM Performed by: Niel Hummer, CRNA Pre-anesthesia Checklist: Patient identified, Emergency Drugs available, Suction available and Patient being monitored Oxygen Delivery Method: Simple face mask

## 2019-08-11 NOTE — Evaluation (Signed)
Physical Therapy Evaluation Patient Details Name: Herbert Morrison MRN: [id number] DOB: Sep 18, 1945 Today's Date: 08/11/2019   History of Present Illness  Patient is 74 y.o. male s/p Lt TKA on 08/11/19 with PMH significant for ulerative colitis, PN,  HTN, GERD, OA, depression, anxiety, Lt THA in 2018.    Clinical Impression  Herbert Morrison is a 74 y.o. male POD 0 s/p Lt TKA. Patient reports independence with mobility at baseline. Patient is now limited by functional impairments (see PT problem list below) and requires min assist for transfers and gait with RW. Patient was able to ambulate ~90 feet with RW and min assist. Patient instructed in exercise to facilitate ROM and circulation. Patient will benefit from continued skilled PT interventions to address impairments and progress towards PLOF. Acute PT will follow to progress mobility and stair training in preparation for safe discharge home.     Follow Up Recommendations Follow surgeon's recommendation for DC plan and follow-up therapies    Equipment Recommendations  Rolling walker with 5" wheels    Recommendations for Other Services       Precautions / Restrictions Precautions Precautions: Fall Restrictions Weight Bearing Restrictions: No      Mobility  Bed Mobility Overal bed mobility: Needs Assistance Bed Mobility: Supine to Sit     Supine to sit: HOB elevated;Min guard     General bed mobility comments: pt using bed rail to sit up, no assist required. cues to scoot to EOB to place feet on floor.  Transfers Overall transfer level: Needs assistance Equipment used: Rolling walker (2 wheeled) Transfers: Sit to/from Stand Sit to Stand: Min assist        General transfer comment: cues for hand placement and technique with RW, assist required to complete power up and steady with rising.  Ambulation/Gait Ambulation/Gait assistance: Min assist Gait Distance (Feet): 90 Feet Assistive device: Rolling walker (2 wheeled) Gait  Pattern/deviations: Step-to pattern;Decreased stance time - left;Decreased stride length;Decreased weight shift to left Gait velocity: decreased   General Gait Details: verbal cues for safe step pattern and safe proximitiy to RW. intermittent assist required for walker positioning and to correct step pattern.  Stairs       Wheelchair Mobility    Modified Rankin (Stroke Patients Only)       Balance Overall balance assessment: Needs assistance Sitting-balance support: Feet supported Sitting balance-Leahy Scale: Good     Standing balance support: During functional activity;Bilateral upper extremity supported Standing balance-Leahy Scale: Poor            Pertinent Vitals/Pain Pain Assessment: 0-10 Pain Score: 1  Pain Location: Lt knee Pain Descriptors / Indicators: Aching Pain Intervention(s): Limited activity within patient's tolerance;Monitored during session;Repositioned;Ice applied    Home Living Family/patient expects to be discharged to:: Private residence Living Arrangements: Spouse/significant other Available Help at Discharge: Family Type of Home: House Home Access: Stairs to enter Entrance Stairs-Rails: None;Can reach both Entrance Stairs-Number of Steps: 4 with bil hand rails +1 Home Layout: Two level;Able to live on main level with bedroom/bathroom;Full bath on main level Home Equipment: Walker - 4 wheels;Cane - single point      Prior Function Level of Independence: Independent               Hand Dominance   Dominant Hand: Right    Extremity/Trunk Assessment   Upper Extremity Assessment Upper Extremity Assessment: Overall WFL for tasks assessed    Lower Extremity Assessment Lower Extremity Assessment: LLE deficits/detail LLE Deficits / Details: good  quad activation, no extensor lag with SLR, 4/5 for quad strength with MMT LLE Sensation: WNL(pt reports some "fuzzy" sensation with feet on floor) LLE Coordination: WNL    Cervical / Trunk  Assessment Cervical / Trunk Assessment: Normal  Communication   Communication: No difficulties  Cognition Arousal/Alertness: Awake/alert Behavior During Therapy: WFL for tasks assessed/performed Overall Cognitive Status: Within Functional Limits for tasks assessed             General Comments      Exercises Total Joint Exercises Ankle Circles/Pumps: AROM;20 reps;Both;Seated Quad Sets: AROM;Left;5 reps;Seated Heel Slides: AROM;Left;5 reps;Seated   Assessment/Plan    PT Assessment Patient needs continued PT services  PT Problem List Decreased strength;Decreased activity tolerance;Decreased range of motion;Decreased mobility;Decreased balance;Decreased knowledge of use of DME       PT Treatment Interventions Gait training;DME instruction;Stair training;Therapeutic activities;Functional mobility training;Therapeutic exercise;Balance training;Patient/family education    PT Goals (Current goals can be found in the Care Plan section)  Acute Rehab PT Goals Patient Stated Goal: to walk without pain PT Goal Formulation: With patient Time For Goal Achievement: 08/18/19 Potential to Achieve Goals: Good    Frequency 7X/week    AM-PAC PT "6 Clicks" Mobility  Outcome Measure Help needed turning from your back to your side while in a flat bed without using bedrails?: A Little Help needed moving from lying on your back to sitting on the side of a flat bed without using bedrails?: A Little Help needed moving to and from a bed to a chair (including a wheelchair)?: A Little Help needed standing up from a chair using your arms (e.g., wheelchair or bedside chair)?: A Little Help needed to walk in hospital room?: A Little Help needed climbing 3-5 steps with a railing? : A Little 6 Click Score: 18    End of Session Equipment Utilized During Treatment: Gait belt Activity Tolerance: Patient tolerated treatment well Patient left: in chair;with chair alarm set;with call bell/phone within  reach Nurse Communication: Mobility status PT Visit Diagnosis: Muscle weakness (generalized) (M62.81);Difficulty in walking, not elsewhere classified (R26.2)    Time: 6160-7371 PT Time Calculation (min) (ACUTE ONLY): 29 min   Charges:   PT Evaluation $PT Eval Low Complexity: 1 Low PT Treatments $Gait Training: 8-22 mins      Verner Mould, DPT Physical Therapist with [facility] [phone]  08/11/2019 4:39 PM

## 2019-08-11 NOTE — Anesthesia Procedure Notes (Addendum)
Anesthesia Regional Block: Adductor canal block   Pre-Anesthetic Checklist: ,, timeout performed, Correct Patient, Correct Site, Correct Laterality, Correct Procedure, Correct Position, site marked, Risks and benefits discussed,  Surgical consent,  Pre-op evaluation,  At surgeon's request and post-op pain management  Laterality: Left  Prep: Maximum Sterile Barrier Precautions used, chloraprep       Needles:  Injection technique: Single-shot  Needle Type: Echogenic Stimulator Needle     Needle Length: 9cm  Needle Gauge: 22     Additional Needles:   Procedures:,,,, ultrasound used (permanent image in chart),,,,  Narrative:  Start time: 08/11/2019 7:00 AM End time: 08/11/2019 7:05 AM Injection made incrementally with aspirations every 5 mL.  Performed by: Personally  Anesthesiologist: Pervis Hocking, DO  Additional Notes: Monitors applied. No increased pain on injection. No increased resistance to injection. Injection made in 5cc increments. Good needle visualization. Patient tolerated procedure well.

## 2019-08-11 NOTE — Transfer of Care (Signed)
Immediate Anesthesia Transfer of Care Note  Patient: Herbert Morrison  Procedure(s) Performed: TOTAL KNEE ARTHROPLASTY (Left Knee)  Patient Location: PACU  Anesthesia Type:Spinal  Level of Consciousness: awake  Airway & Oxygen Therapy: Patient Spontanous Breathing and Patient connected to face mask oxygen  Post-op Assessment: Report given to RN and Post -op Vital signs reviewed and stable  Post vital signs: Reviewed and stable  Last Vitals:  Vitals Value Taken Time  BP    Temp    Pulse 54 08/11/19 0930  Resp 12 08/11/19 0930  SpO2 96 % 08/11/19 0930  Vitals shown include unvalidated device data.  Last Pain:  Vitals:   08/11/19 0548  TempSrc: Oral  PainSc:          Complications: No apparent anesthesia complications

## 2019-08-11 NOTE — Discharge Instructions (Signed)

## 2019-08-11 NOTE — Anesthesia Procedure Notes (Signed)
Spinal  Patient location during procedure: OR Start time: 08/11/2019 7:45 AM End time: 08/11/2019 7:49 AM Staffing Performed: resident/CRNA  Resident/CRNA: Niel Hummer, CRNA Preanesthetic Checklist Completed: patient identified, IV checked, risks and benefits discussed, surgical consent, monitors and equipment checked and pre-op evaluation Spinal Block Patient position: sitting Prep: DuraPrep Patient monitoring: heart rate, continuous pulse ox and blood pressure Approach: midline Location: L3-4 Injection technique: single-shot Needle Needle type: Pencan  Needle gauge: 24 G Needle length: 9 cm

## 2019-08-11 NOTE — Op Note (Signed)
NAME:  Herbert Morrison                      MEDICAL RECORD NO.:  [id number]                             FACILITY:  [facility]      PHYSICIAN:  Pietro Cassis. Alvan Dame, M.D.  DATE OF BIRTH:  31-Aug-1945      DATE OF PROCEDURE:  08/11/2019                                     OPERATIVE REPORT         PREOPERATIVE DIAGNOSIS:  Left knee osteoarthritis.      POSTOPERATIVE DIAGNOSIS:  Left knee osteoarthritis.      FINDINGS:  The patient was noted to have complete loss of cartilage and   bone-on-bone arthritis with associated osteophytes in the medial and patellofemoral compartments of   the knee with a significant synovitis and associated effusion.  The patient had failed months of conservative treatment including medications, injection therapy, activity modification.     PROCEDURE:  Left total knee replacement.      COMPONENTS USED:  DePuy Attune rotating platform posterior stabilized knee   system, a size 6 femur, 7 tibia, size 6 mm PS AOX insert, and 38 anatomic patellar   button.      SURGEON:  Pietro Cassis. Alvan Dame, M.D.      ASSISTANT:  Griffith Citron, PA-C.      ANESTHESIA:  Regional and Spinal.      SPECIMENS:  None.      COMPLICATION:  None.      DRAINS:  None.  EBL: <100cc      TOURNIQUET TIME:   Total Tourniquet Time Documented: Thigh (Left) - 30 minutes Total: Thigh (Left) - 30 minutes  .      The patient was stable to the recovery room.      INDICATION FOR PROCEDURE:  Herbert Morrison is a 74 y.o. male patient of   mine.  The patient had been seen, evaluated, and treated for months conservatively in the   office with medication, activity modification, and injections.  The patient had   radiographic changes of bone-on-bone arthritis with endplate sclerosis and osteophytes noted.  Based on the radiographic changes and failed conservative measures, the patient   decided to proceed with definitive treatment, total knee replacement.  Risks of infection, DVT, component failure, need for  revision surgery, neurovascular injury were reviewed in the office setting.  The postop course was reviewed stressing the efforts to maximize post-operative satisfaction and function.  Consent was obtained for benefit of pain   relief.      PROCEDURE IN DETAIL:  The patient was brought to the operative theater.   Once adequate anesthesia, preoperative antibiotics, 2 gm of Ancef,1 gm of Tranexamic Acid, and 10 mg of Decadron administered, the patient was positioned supine with a left thigh tourniquet placed.  The  left lower extremity was prepped and draped in sterile fashion.  A time-   out was performed identifying the patient, planned procedure, and the appropriate extremity.      The left lower extremity was placed in the [facility] leg holder.  The leg was   exsanguinated, tourniquet elevated to 250 mmHg.  A midline incision was   made  followed by median parapatellar arthrotomy.  Following initial   exposure, attention was first directed to the patella.  Precut   measurement was noted to be 26 mm.  I resected down to 14 mm and used a   38 anatomic patellar button to restore patellar height as well as cover the cut surface.      The lug holes were drilled and a metal shim was placed to protect the   patella from retractors and saw blade during the procedure.      At this point, attention was now directed to the femur.  The femoral   canal was opened with a drill, irrigated to try to prevent fat emboli.  An   intramedullary rod was passed at 5 degrees valgus, 9 mm of bone was   resected off the distal femur.  Following this resection, the tibia was   subluxated anteriorly.  Using the extramedullary guide, 2 mm of bone was resected off   the proximal medial tibia.  We confirmed the gap would be   stable medially and laterally with a size 5 spacer block as well as confirmed that the tibial cut was perpendicular in the coronal plane, checking with an alignment rod.      Once this was done, I  sized the femur to be a size 6 in the anterior-   posterior dimension, chose a standard component based on medial and   lateral dimension.  The size 6 rotation block was then pinned in   position anterior referenced using the C-clamp to set rotation.  The   anterior, posterior, and  chamfer cuts were made without difficulty nor   notching making certain that I was along the anterior cortex to help   with flexion gap stability.      The final box cut was made off the lateral aspect of distal femur.      At this point, the tibia was sized to be a size 7.  The size 7 tray was   then pinned in position through the medial third of the tubercle,   drilled, and keel punched.  Trial reduction was now carried with a 6 femur,  7 tibia, a size 6 mm PS insert, and the 38 anatomic patella botton.  The knee was brought to full extension with good flexion stability with the patella   tracking through the trochlea without application of pressure.  Given   all these findings the trial components removed.  Final components were   opened and cement was mixed.  The knee was irrigated with normal saline solution and pulse lavage.  The synovial lining was   then injected with 30 cc of 0.25% Marcaine with epinephrine, 1 cc of Toradol and 30 cc of NS for a total of 61 cc.     Final implants were then cemented onto cleaned and dried cut surfaces of bone with the knee brought to extension with a size 6 mm PS trial insert.      Once the cement had fully cured, excess cement was removed   throughout the knee.  I confirmed that I was satisfied with the range of   motion and stability, and the final size 6 mm PS AOX insert was chosen.  It was   placed into the knee.      The tourniquet had been let down at 30 minutes.  No significant   hemostasis was required.  The extensor mechanism was then reapproximated using #1 Vicryl and #  1 Stratafix sutures with the knee   in flexion.  The   remaining wound was closed with 2-0  Vicryl and running 4-0 Monocryl.   The knee was cleaned, dried, dressed sterilely using Dermabond and   Aquacel dressing.  The patient was then   brought to recovery room in stable condition, tolerating the procedure   well.   Please note that Physician Assistant, Griffith Citron, PA-C was present for the entirety of the case, and was utilized for pre-operative positioning, peri-operative retractor management, general facilitation of the procedure and for primary wound closure at the end of the case.              Pietro Cassis Alvan Dame, M.D.    08/11/2019 9:00 AM

## 2019-08-11 NOTE — Anesthesia Postprocedure Evaluation (Signed)
Anesthesia Post Note  Patient: OTHMAN MASUR  Procedure(s) Performed: TOTAL KNEE ARTHROPLASTY (Left Knee)     Patient location during evaluation: PACU Anesthesia Type: Regional, Spinal and MAC Level of consciousness: awake and alert, oriented and patient cooperative Pain management: pain level controlled Vital Signs Assessment: post-procedure vital signs reviewed and stable Respiratory status: spontaneous breathing, nonlabored ventilation and respiratory function stable Cardiovascular status: blood pressure returned to baseline and stable Postop Assessment: no apparent nausea or vomiting, no headache, no backache and spinal receding Anesthetic complications: no    Last Vitals:  Vitals:   08/11/19 0548 08/11/19 0930  BP: (!) 158/95 102/63  Pulse: 62 (!) 54  Resp: 14 12  Temp: 36.9 C 36.5 C  SpO2: 95% 96%    Last Pain:  Vitals:   08/11/19 0945  TempSrc:   PainSc: 0-No pain                 Pervis Hocking

## 2019-08-11 NOTE — Interval H&P Note (Signed)
History and Physical Interval Note:  08/11/2019 7:36 AM  Herbert Morrison  has presented today for surgery, with the diagnosis of Left knee osteoarthritis.  The various methods of treatment have been discussed with the patient and family. After consideration of risks, benefits and other options for treatment, the patient has consented to  Procedure(s) with comments: TOTAL KNEE ARTHROPLASTY (Left) - 70 mins as a surgical intervention.  The patient's history has been reviewed, patient examined, no change in status, stable for surgery.  I have reviewed the patient's chart and labs.  Questions were answered to the patient's satisfaction.     Mauri Pole

## 2019-08-12 DIAGNOSIS — M1712 Unilateral primary osteoarthritis, left knee: Secondary | ICD-10-CM | POA: Diagnosis not present

## 2019-08-12 LAB — CBC
HCT: 43 % (ref 39.0–52.0)
Hemoglobin: 13.7 g/dL (ref 13.0–17.0)
MCH: 28.9 pg (ref 26.0–34.0)
MCHC: 31.9 g/dL (ref 30.0–36.0)
MCV: 90.7 fL (ref 80.0–100.0)
Platelets: 284 10*3/uL (ref 150–400)
RBC: 4.74 MIL/uL (ref 4.22–5.81)
RDW: 13.7 % (ref 11.5–15.5)
WBC: 20.4 10*3/uL — ABNORMAL HIGH (ref 4.0–10.5)
nRBC: 0 % (ref 0.0–0.2)

## 2019-08-12 LAB — BASIC METABOLIC PANEL
Anion gap: 6 (ref 5–15)
BUN: 20 mg/dL (ref 8–23)
CO2: 24 mmol/L (ref 22–32)
Calcium: 8.8 mg/dL — ABNORMAL LOW (ref 8.9–10.3)
Chloride: 110 mmol/L (ref 98–111)
Creatinine, Ser: 1.24 mg/dL (ref 0.61–1.24)
GFR calc Af Amer: >60 mL/min (ref >60)
GFR calc non Af Amer: 58 mL/min — ABNORMAL LOW (ref >60)
Glucose, Bld: 156 mg/dL — ABNORMAL HIGH (ref 70–99)
Potassium: 5 mmol/L (ref 3.5–5.1)
Sodium: 140 mmol/L (ref 135–145)

## 2019-08-12 MED ORDER — POLYETHYLENE GLYCOL 3350 17 G PO PACK: 17.0000 g | PACK | Freq: Two times a day (BID) | ORAL | 0 refills | Status: DC

## 2019-08-12 MED ORDER — ASPIRIN 81 MG PO CHEW: 81.0000 mg | CHEWABLE_TABLET | Freq: Two times a day (BID) | ORAL | 0 refills | Status: AC

## 2019-08-12 MED ORDER — HYDROCODONE-ACETAMINOPHEN 7.5-325 MG PO TABS: 1.0000 | ORAL_TABLET | ORAL | 0 refills | Status: DC | PRN

## 2019-08-12 MED ORDER — DOCUSATE SODIUM 100 MG PO CAPS: 100.0000 mg | ORAL_CAPSULE | Freq: Two times a day (BID) | ORAL | 0 refills | Status: DC

## 2019-08-12 MED ORDER — METHOCARBAMOL 500 MG PO TABS: 500.0000 mg | ORAL_TABLET | Freq: Four times a day (QID) | ORAL | 0 refills | Status: DC | PRN

## 2019-08-12 MED ORDER — FERROUS SULFATE 325 (65 FE) MG PO TABS: 325.0000 mg | ORAL_TABLET | Freq: Three times a day (TID) | ORAL | 0 refills | Status: DC

## 2019-08-12 NOTE — TOC Progression Note (Signed)
Transition of Care [facility]) - Progression Note    Patient Details  Name: Herbert Morrison MRN: [id number] Date of Birth: [birth year]  Transition of Care [facility]) CM/SW [place], LCSW Phone Number: 08/12/2019, 11:03 AM  Clinical Narrative:    RW delivered to bedside.      Barriers to Discharge: Barriers Resolved  Expected Discharge Plan and Services           Expected Discharge Date: 08/12/19               DME Arranged: Gilford Rile rolling DME Agency: [employer] Date DME Agency Contacted: 08/12/19 Time DME Agency Contacted: [phone] Representative spoke with at DME Agency: Onsite (Form completed)             Social Determinants of Health (SDOH) Interventions    Readmission Risk Interventions No flowsheet data found.

## 2019-08-12 NOTE — Progress Notes (Signed)
Discharge teaching done with patient and his wife.  Written information given.

## 2019-08-12 NOTE — Progress Notes (Signed)
Physical Therapy Treatment Patient Details Name: Herbert Morrison MRN: [id number] DOB: 1945/07/29 Today's Date: 08/12/2019    History of Present Illness Patient is 74 y.o. male s/p Lt TKA on 08/11/19 with PMH significant for ulerative colitis, PN,  HTN, GERD, OA, depression, anxiety, Lt THA in 2018.    PT Comments    Patient is POD 1 s/p Lt TKA and is progressing well with therapy. Patient demonstrated good carryover for gait and transfers with RW. No overt LOB noted; pt ambulated ~200' with RW at supervision level. Patient educated on safe technique for stair mobility with 2 rails and with RW for single step, demonstrated safe step pattern and verbalized safe guarding position for person's assisting him to return home. Patient instructed in HEP to progress ROM gradually and instructed on need to make appointment for OPPT as soon as able to progress therapy after returning home. He will continue to benefit from skilled PT interventions in OPPT setting following discharge from acute care and has met mobility goals at safe level for discharge home with assist from wife.    Follow Up Recommendations  Follow surgeon's recommendation for DC plan and follow-up therapies     Equipment Recommendations  Rolling walker with 5" wheels    Recommendations for Other Services       Precautions / Restrictions Precautions Precautions: Fall Restrictions Weight Bearing Restrictions: No    Mobility  Bed Mobility Overal bed mobility: Needs Assistance      General bed mobility comments: pt OOB in recliner at start of session, ended in recliner.  Transfers Overall transfer level: Needs assistance Equipment used: Rolling walker (2 wheeled) Transfers: Sit to/from Stand Sit to Stand: Supervision         General transfer comment: pt required minimal cuing for hand placement with RW, no assist required to rise from recliner. no cues needed for safe reach back to  chair.  Ambulation/Gait Ambulation/Gait assistance: Min assist Gait Distance (Feet): 200 Feet Assistive device: Rolling walker (2 wheeled) Gait Pattern/deviations: Step-to pattern;Decreased stance time - left;Decreased stride length;Decreased weight shift to left;Step-through pattern Gait velocity: fair   General Gait Details: pt wtih good recall for step to pattern at start of gait and progressed to more advanced step through, pt stated he prefers the step to pattern and therapist educated that this is a safe technique. no overt LOB noted, pt maintained safe proximity to RW throughout.   Stairs Stairs: Yes Stairs assistance: Min guard Stair Management: No rails;Two rails;Step to pattern;Forwards;Backwards Number of Stairs: 7(2x3, 1x1) General stair comments: pt instructed on step sequencing "up with good down with bad" for stair mobility. pt performed 2x to ascend/descend 3 stairs with 2 hand rails, pt verablized safe guarding position for persons assisting him. pt educated on singel step up technique with RW for reverese step up.    Wheelchair Mobility    Modified Rankin (Stroke Patients Only)       Balance Overall balance assessment: Needs assistance Sitting-balance support: Feet supported Sitting balance-Leahy Scale: Good     Standing balance support: During functional activity;Bilateral upper extremity supported Standing balance-Leahy Scale: Fair         Cognition Arousal/Alertness: Awake/alert Behavior During Therapy: WFL for tasks assessed/performed Overall Cognitive Status: Within Functional Limits for tasks assessed         Exercises Total Joint Exercises Ankle Circles/Pumps: AROM;20 reps;Both;Seated Quad Sets: AROM;Left;5 reps;Seated Short Arc Quad: AROM;Left;5 reps;Seated Heel Slides: AROM;Left;5 reps;Seated Hip ABduction/ADduction: AROM;5 reps;Seated;Left Straight Leg Raises: AROM;Left;5 reps;Seated  Long Arc Quad: Left;AROM;5 reps;Seated Knee Flexion:  AROM;AAROM;Left;5 reps;Seated    General Comments        Pertinent Vitals/Pain Pain Assessment: 0-10 Pain Score: 3  Pain Location: Lt knee Pain Descriptors / Indicators: Aching Pain Intervention(s): Limited activity within patient's tolerance;Monitored during session;Repositioned;Ice applied           PT Goals (current goals can now be found in the care plan section) Acute Rehab PT Goals Patient Stated Goal: to walk without pain PT Goal Formulation: With patient Time For Goal Achievement: 08/18/19 Potential to Achieve Goals: Good Progress towards PT goals: Progressing toward goals    Frequency    7X/week      PT Plan Current plan remains appropriate       AM-PAC PT "6 Clicks" Mobility   Outcome Measure  Help needed turning from your back to your side while in a flat bed without using bedrails?: A Little Help needed moving from lying on your back to sitting on the side of a flat bed without using bedrails?: A Little Help needed moving to and from a bed to a chair (including a wheelchair)?: A Little Help needed standing up from a chair using your arms (e.g., wheelchair or bedside chair)?: A Little Help needed to walk in hospital room?: A Little Help needed climbing 3-5 steps with a railing? : A Little 6 Click Score: 18    End of Session Equipment Utilized During Treatment: Gait belt Activity Tolerance: Patient tolerated treatment well Patient left: in chair;with call bell/phone within reach Nurse Communication: Mobility status PT Visit Diagnosis: Muscle weakness (generalized) (M62.81);Difficulty in walking, not elsewhere classified (R26.2)     Time: 7125-2479 PT Time Calculation (min) (ACUTE ONLY): 34 min  Charges:  $Gait Training: 8-22 mins $Therapeutic Exercise: 8-22 mins                     Verner Mould, DPT Physical Therapist with [facility] [phone]  08/12/2019 10:20 AM

## 2019-08-12 NOTE — Progress Notes (Signed)
     Subjective: 1 Day Post-Op Procedure(s) (LRB): TOTAL KNEE ARTHROPLASTY (Left)   Patient reports pain as mild, pain controlled.  No reported events throughout the night. Dr. Alvan Dame discussed the procedure, findings and expectations moving forward. Ready to be discharged home.   Patient's anticipated LOS is less than 2 midnights, meeting these requirements: - Lives within 1 hour of care - Has a competent adult at home to recover with post-op recover - NO history of  - Chronic pain requiring opiods  - Diabetes  - Coronary Artery Disease  - Heart failure  - Heart attack  - Stroke  - DVT/VTE  - Cardiac arrhythmia  - Respiratory Failure/COPD  - Renal failure  - Anemia  - Advanced Liver disease       Objective:   VITALS:   Vitals:   08/12/19 0200 08/12/19 0538  BP: (!) 146/82 (!) 152/87  Pulse: 72 65  Resp: 18 18  Temp: 98.6 F (37 C) 98.6 F (37 C)  SpO2: 94% 94%    Dorsiflexion/Plantar flexion intact Incision: dressing C/D/I No cellulitis present Compartment soft  LABS Recent Labs    08/12/19 0427  HGB 13.7  HCT 43.0  WBC 20.4*  PLT 284    Recent Labs    08/12/19 0427  NA 140  K 5.0  BUN 20  CREATININE 1.24  GLUCOSE 156*     Assessment/Plan: 1 Day Post-Op Procedure(s) (LRB): TOTAL KNEE ARTHROPLASTY (Left) Foley cath d/c'ed Advance diet Up with therapy D/C IV fluids Discharge home Follow up in 2 weeks at [facility] Follow up with OLIN,Brennen Camper D in 2 weeks.  Contact information:  [facility] [address] [place] [phone]     Obese (BMI 30-39.9) Estimated body mass index is 30.34 kg/m as calculated from the following:   Height as of this encounter: 5' 6"  (1.676 m).   Weight as of this encounter: 85.3 kg. Patient also counseled that weight may inhibit the healing process Patient counseled that losing weight will help with future health issues       Danae Orleans PA-C    [facility]  [place] [address], [address], [place], [place] [address] Phone: [phone] [email or web address] Facebook  [employer]

## 2019-08-15 ENCOUNTER — Encounter: Payer: Self-pay | Admitting: *Deleted

## 2019-08-16 NOTE — Discharge Summary (Signed)
Physician Discharge Summary  Patient ID: Herbert Morrison MRN: [id number] DOB/AGE: [birth year] 72 y.o.  Admit date: 08/11/2019 Discharge date: 08/12/2019   Procedures:  Procedure(s) (LRB): TOTAL KNEE ARTHROPLASTY (Left)  Attending Physician:  Dr. Paralee Cancel   Admission Diagnoses:   Left knee primary OA / pain  Discharge Diagnoses:  Principal Problem:   S/P left TKA Active Problems:   Status post total left knee replacement  Past Medical History:  Diagnosis Date  . Anxiety   . Arthritis   . Cancer ([place])    skin cancer removed Left shoulder Basal cell  . Depression   . GERD (gastroesophageal reflux disease)   . Heart murmur    as a child  . History of hiatal hernia   . Hypertension   . Pneumonia   . 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([place])     HPI:    Herbert Morrison, 74 y.o. male, has a history of pain and functional disability in the left knee due to arthritis and has failed non-surgical conservative treatments for greater than 12 weeks to include NSAID's and/or analgesics and activity modification.  Onset of symptoms was gradual, starting 10 years ago with gradually worsening course since that time. The patient noted prior procedures on the knee to include  menisectomy on the left knee(s).  Patient currently rates pain in the left knee(s) at 10 out of 10 with activity. Patient has night pain, worsening of pain with activity and weight bearing, pain that interferes with activities of daily living, pain with passive range of motion, crepitus and joint swelling.  Patient has evidence of periarticular osteophytes and joint space narrowing by imaging studies. There is no active infection.  Risks, benefits and expectations were discussed with the patient.  Risks including but not limited to the risk of anesthesia, blood clots, nerve damage, blood vessel damage, failure of the prosthesis, infection and up to and including death.  Patient understand the risks, benefits and expectations and wishes  to proceed with surgery.   PCP: Roderic Scarce, MD   Discharged Condition: good  Hospital Course:  Patient underwent the above stated procedure on 08/11/2019. Patient tolerated the procedure well and brought to the recovery room in good condition and subsequently to the floor.  POD #1 BP: 152/87 ; Pulse: 65 ; Temp: 98.6 F (37 C) ; Resp: 18 Patient reports pain as mild, pain controlled.  No reported events throughout the night. Dr. Alvan Dame discussed the procedure, findings and expectations moving forward. Ready to be discharged home. Dorsiflexion/plantar flexion intact, incision: dressing C/D/I, no cellulitis present and compartment soft.   LABS  Basename    HGB     13.7  HCT     43.0    Discharge Exam: General appearance: alert, cooperative and no distress Extremities: Homans sign is negative, no sign of DVT, no edema, redness or tenderness in the calves or thighs and no ulcers, gangrene or trophic changes  Disposition: Home with follow up in 2 weeks   Follow-up Information    Paralee Cancel, MD. Schedule an appointment as soon as possible for a visit in 2 weeks.   Specialty: Orthopedic Surgery Contact information: [address] [place] [address] [phone]           Discharge Instructions    Call MD / Call 911   Complete by: As directed    If you experience chest pain or shortness of breath, CALL 911 and be transported to the hospital emergency room.  If you develope a fever above 101 F, pus (white drainage) or increased drainage or redness at the wound, or calf pain, call your surgeon's office.   Change dressing   Complete by: As directed    Maintain surgical dressing until follow up in the clinic. If the edges start to pull up, may reinforce with tape. If the dressing is no longer working, may remove and cover with gauze and tape, but must keep the area dry and clean.  Call with any questions or concerns.   Constipation Prevention   Complete by: As  directed    Drink plenty of fluids.  Prune juice may be helpful.  You may use a stool softener, such as Colace (over the counter) 100 mg twice a day.  Use MiraLax (over the counter) for constipation as needed.   Diet - low sodium heart healthy   Complete by: As directed    Discharge instructions   Complete by: As directed    Maintain surgical dressing until follow up in the clinic. If the edges start to pull up, may reinforce with tape. If the dressing is no longer working, may remove and cover with gauze and tape, but must keep the area dry and clean.  Follow up in 2 weeks at [facility]. Call with any questions or concerns.   Increase activity slowly as tolerated   Complete by: As directed    Weight bearing as tolerated with assist device (walker, cane, etc) as directed, use it as long as suggested by your surgeon or therapist, typically at least 4-6 weeks.   TED hose   Complete by: As directed    Use stockings (TED hose) for 2 weeks on both leg(s).  You may remove them at night for sleeping.      Allergies as of 08/12/2019      Reactions   Erythromycin Nausea Only      Medication List    STOP taking these medications   ibuprofen 200 MG tablet Commonly known as: ADVIL     TAKE these medications   amLODipine-valsartan 10-320 MG tablet Commonly known as: EXFORGE Take 1 tablet by mouth daily.   aspirin 81 MG chewable tablet Commonly known as: Aspirin Childrens Chew 1 tablet (81 mg total) by mouth 2 (two) times daily. Take for 4 weeks, then resume regular dose.   cetirizine 10 MG tablet Commonly known as: ZYRTEC Take 10 mg by mouth daily.   docusate sodium 100 MG capsule Commonly known as: Colace Take 1 capsule (100 mg total) by mouth 2 (two) times daily.   escitalopram 20 MG tablet Commonly known as: LEXAPRO Take 20 mg by mouth daily.   ferrous sulfate 325 (65 FE) MG tablet Commonly known as: FerrouSul Take 1 tablet (325 mg total) by mouth 3 (three) times daily with  meals for 14 days.   fluticasone 50 MCG/ACT nasal spray Commonly known as: FLONASE Place 1 spray into the nose daily.   HYDROcodone-acetaminophen 7.5-325 MG tablet Commonly known as: Norco Take 1-2 tablets by mouth every 4 (four) hours as needed for moderate pain.   methocarbamol 500 MG tablet Commonly known as: Robaxin Take 1 tablet (500 mg total) by mouth every 6 (six) hours as needed for muscle spasms.   omeprazole 20 MG capsule Commonly known as: PRILOSEC Take 20 mg by mouth daily.   polyethylene glycol 17 g packet Commonly known as: MIRALAX / GLYCOLAX Take 17 g by mouth 2 (two) times daily.   PROBIOTIC-PREBIOTIC PO Take 2  tablets by mouth daily.   zinc gluconate 50 MG tablet Take 50 mg by mouth daily.            Discharge Care Instructions  (From admission, onward)         Start     Ordered   08/12/19 0000  Change dressing    Comments: Maintain surgical dressing until follow up in the clinic. If the edges start to pull up, may reinforce with tape. If the dressing is no longer working, may remove and cover with gauze and tape, but must keep the area dry and clean.  Call with any questions or concerns.   08/12/19 2429           Signed: [place] Pugh. Teja Costen   PA-C  08/16/2019, 8:55 AM

## 2019-09-22 ENCOUNTER — Ambulatory Visit (INDEPENDENT_AMBULATORY_CARE_PROVIDER_SITE_OTHER): Payer: Medicare Other | Admitting: Gastroenterology

## 2019-11-03 ENCOUNTER — Encounter (INDEPENDENT_AMBULATORY_CARE_PROVIDER_SITE_OTHER): Payer: Self-pay | Admitting: Gastroenterology

## 2019-11-03 ENCOUNTER — Ambulatory Visit (INDEPENDENT_AMBULATORY_CARE_PROVIDER_SITE_OTHER): Payer: Medicare Other | Admitting: Gastroenterology

## 2019-11-03 ENCOUNTER — Other Ambulatory Visit: Payer: Self-pay

## 2019-11-03 VITALS — BP 162/86 | HR 60 | Temp 98.0°F | Ht 66.0 in | Wt 194.0 lb

## 2019-11-03 DIAGNOSIS — K219 Gastro-esophageal reflux disease without esophagitis: Secondary | ICD-10-CM

## 2019-11-03 DIAGNOSIS — Z86010 Personal history of colonic polyps: Secondary | ICD-10-CM

## 2019-11-03 DIAGNOSIS — K5120 Ulcerative (chronic) proctitis without complications: Secondary | ICD-10-CM

## 2019-11-03 NOTE — Progress Notes (Signed)
Patient profile: Herbert Morrison is a 74 y.o. male seen for follow-up of ulcerative proctitis.  Recent past medical history updates - total knee replacement in April 2021.  Past medical history includes a approximately 25-year history of ulcerative colitis.  He has not been on maintenance therapy for many years.  Chart review indicates he was last on  uceris for flare-like symptoms in 2016.  History of Present Illness: Herbert Morrison is seen today & reports he is overall doing well.  He did have some issues with mild constipation and bloating, this initially began after his knee replacement while was on narcotics.  It is improved but is still having some straining with bowel movements.  Does move stool daily.  Currently taking Colace 200 mg daily.  Denies any rectal bleeding or melena.  No lower abdominal pain.  He does omeprazole 20 mg once a day with good control of reflux.  He denies nausea, vomiting, dysphagia.  Wt Readings from Last 3 Encounters:  11/03/19 194 lb (88 kg)  08/11/19 188 lb (85.3 kg)  08/08/19 190 lb 9 oz (86.4 kg)        Past Medical History:  Past Medical History:  Diagnosis Date  . Anxiety   . Arthritis   . Cancer ([place])    skin cancer removed Left shoulder Basal cell  . Depression   . GERD (gastroesophageal reflux disease)   . Heart murmur    as a child  . History of hiatal hernia   . Hypertension   . Pneumonia   . 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([place])     Problem List: Patient Active Problem List   Diagnosis Date Noted  . S/P left TKA 08/11/2019  . 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  . Overweight (BMI 25.0-29.9) 03/25/2017  . 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 GERD (gastroesophageal reflux disease) 01/29/2016  . 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([place]) 07/17/2014  . Essential hypertension 07/17/2014    Past Surgical History: Past Surgical History:  Procedure Laterality Date  .  COLONOSCOPY N/A 04/09/2016   Procedure: COLONOSCOPY;  Surgeon: Rogene Houston, MD;  Location: [facility];  Service: Endoscopy;  Laterality: N/A;  1:45  . JOINT REPLACEMENT     L hip Dr Alvan Dame 03/24/17  . left elbow     spur removed  . left knee surgery     open surgery for a meniscus tear 1970's  . POLYPECTOMY  04/09/2016   Procedure: POLYPECTOMY;  Surgeon: Rogene Houston, MD;  Location: [facility];  Service: Endoscopy;;  cecal polyp  . rt knee arthroscopy    . 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   Procedure: LEFT TOTAL HIP ARTHROPLASTY ANTERIOR APPROACH;  Surgeon: Paralee Cancel, MD;  Location: [facility];  Service: Orthopedics;  Laterality: Left;  70 mins  . 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   Procedure: TOTAL KNEE ARTHROPLASTY;  Surgeon: Paralee Cancel, MD;  Location: [facility] ORS;  Service: Orthopedics;  Laterality: Left;  70 mins    Allergies: Allergies  Allergen Reactions  . Erythromycin Nausea Only      Home Medications:  Current Outpatient Medications:  .  amLODipine-valsartan (EXFORGE) 10-320 MG tablet, Take 1 tablet by mouth daily., Disp: , Rfl:  .  aspirin EC 81 MG tablet, Take 81 mg by mouth daily. Swallow whole., Disp: , Rfl:  .  Bacillus Coagulans-Inulin (PROBIOTIC-PREBIOTIC PO), Take 2 tablets by mouth daily., Disp: , Rfl:  .  cetirizine (ZYRTEC) 10 MG tablet, Take 10 mg by mouth daily., Disp: , Rfl:  .  Cyanocobalamin (VITAMIN B12 PO), Take by mouth daily., Disp: , Rfl:  .  docusate sodium (COLACE) 100 MG capsule, Take 1 capsule (100 mg total) by mouth 2 (two) times daily., Disp: 28 capsule, Rfl: 0 .  escitalopram (LEXAPRO) 20 MG tablet, Take 20 mg by mouth daily. , Disp: , Rfl:  .  fluticasone (FLONASE) 50 MCG/ACT nasal spray, Place 1 spray into the nose daily., Disp: , Rfl:  .  omeprazole (PRILOSEC) 20 MG capsule, Take 20 mg by mouth daily. , Disp: , Rfl:  .  VITAMIN D PO, Take by mouth daily., Disp: , Rfl:  .  zinc gluconate 50 MG tablet, Take 50 mg by mouth  daily., Disp: , Rfl:  .  ferrous sulfate (FERROUSUL) 325 (65 FE) MG tablet, Take 1 tablet (325 mg total) by mouth 3 (three) times daily with meals for 14 days., Disp: 42 tablet, Rfl: 0   Family History: family history is not on file.    Social History:   reports that he has quit smoking. He quit smokeless tobacco use about 54 years ago.  His smokeless tobacco use included chew. He reports current alcohol use. He reports that he does not use drugs.   Review of Systems: Constitutional: Denies weight loss/weight gain  Eyes: No changes in vision. ENT: No oral lesions, sore throat.  GI: see HPI.  Heme/Lymph: No easy bruising.  CV: No chest pain.  GU: No hematuria.  Integumentary: No rashes.  Neuro: No headaches.  Psych: No depression/anxiety.  Endocrine: No heat/cold intolerance.  Allergic/Immunologic: No urticaria.  Resp: No cough, SOB.  Musculoskeletal: No joint swelling.    Physical Examination: BP (!) 162/86 (BP Location: Right Arm, Patient Position: Sitting, Cuff Size: Normal)   Pulse 60   Temp 98 F (36.7 C) (Oral)   Ht 5' 6"  (1.676 m)   Wt 194 lb (88 kg)   BMI 31.31 kg/m  Gen: NAD, alert and oriented x 4 HEENT: PEERLA, EOMI, Neck: supple, no JVD Chest: CTA bilaterally, no wheezes, crackles, or other adventitious sounds CV: RRR, no m/g/c/r Abd: soft, NT, ND, +BS in all four quadrants; no HSM, guarding, ridigity, or rebound tenderness Ext: no edema, well perfused with 2+ pulses, Skin: no rash or lesions noted on observed skin Lymph: no noted LAD  Data Reviewed:  2017 colonoscopy showed small polyp cecum, resection was complete but tissue was not retrieved, scarring rectum and sigmoid.  5-year repeat recommended.   Assessment/Plan: [name] is a 74 y.o. male seen for history of ulcerative proctitis in remission off maintenance therapy  1.  History of colon polyps-due for repeat colonoscopy November 2022  2.  Constipation-ongoing since on narcotics after total  knee replacement and overall less active.  He is now weaned off narcotics and has been cleared from therapy.  He will increase stool softeners.  Also discussed fiber intake-he will start eating an apple a day.  He is to contact me if this continues.  3.  GERD-continue PPI, no upper GI alarm symptoms.   He will follow-up next fall to schedule his colonoscopy Dyan was seen today for follow-up.  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 (WP)  Laurine Blazer, [facility]

## 2019-11-03 NOTE — Patient Instructions (Signed)
Increase dietary fiber as discussed. Can increase colace. Follow up Oct 2022

## 2019-12-28 ENCOUNTER — Other Ambulatory Visit: Payer: Self-pay | Admitting: Nurse Practitioner

## 2019-12-28 DIAGNOSIS — Z13820 Encounter for screening for osteoporosis: Secondary | ICD-10-CM

## 2020-04-03 ENCOUNTER — Other Ambulatory Visit: Payer: Self-pay

## 2020-04-03 ENCOUNTER — Ambulatory Visit
Admission: RE | Admit: 2020-04-03 | Discharge: 2020-04-03 | Disposition: A | Discharge status: Home / Self Care | Payer: Medicare Other | Source: Ambulatory Visit | Attending: Nurse Practitioner | Admitting: Nurse Practitioner

## 2020-04-03 DIAGNOSIS — Z13820 Encounter for screening for osteoporosis: Secondary | ICD-10-CM

## 2020-12-10 ENCOUNTER — Ambulatory Visit (INDEPENDENT_AMBULATORY_CARE_PROVIDER_SITE_OTHER): Payer: Medicare Other | Admitting: Gastroenterology

## 2020-12-27 ENCOUNTER — Telehealth (INDEPENDENT_AMBULATORY_CARE_PROVIDER_SITE_OTHER): Payer: Self-pay | Admitting: Gastroenterology

## 2020-12-27 NOTE — Telephone Encounter (Signed)
Patient left voice mail message stating he needs a list of medications he has taken for UC  - please advise - Ph# [phone]

## 2020-12-27 NOTE — Telephone Encounter (Signed)
I called the patient and made him aware he had the following listed in his chart that he had tried in the past. Mesalamine or (Apriso), Dexamethasone, and budesonide.

## 2020-12-31 DIAGNOSIS — M329 Systemic lupus erythematosus, unspecified: Secondary | ICD-10-CM

## 2020-12-31 DIAGNOSIS — IMO0002 Reserved for concepts with insufficient information to code with codable children: Secondary | ICD-10-CM

## 2020-12-31 DIAGNOSIS — M069 Rheumatoid arthritis, unspecified: Secondary | ICD-10-CM

## 2020-12-31 HISTORY — DX: Rheumatoid arthritis, unspecified: M06.9

## 2020-12-31 HISTORY — DX: Reserved for concepts with insufficient information to code with codable children: IMO0002

## 2020-12-31 HISTORY — DX: Systemic lupus erythematosus, unspecified: M32.9

## 2021-01-10 ENCOUNTER — Ambulatory Visit (INDEPENDENT_AMBULATORY_CARE_PROVIDER_SITE_OTHER): Payer: Medicare Other | Admitting: Gastroenterology

## 2021-01-10 ENCOUNTER — Encounter (INDEPENDENT_AMBULATORY_CARE_PROVIDER_SITE_OTHER): Payer: Self-pay | Admitting: Gastroenterology

## 2021-01-10 ENCOUNTER — Other Ambulatory Visit: Payer: Self-pay

## 2021-01-10 VITALS — BP 181/98 | HR 70 | Temp 98.1°F | Ht 66.0 in | Wt 212.0 lb

## 2021-01-10 DIAGNOSIS — Z1321 Encounter for screening for nutritional disorder: Secondary | ICD-10-CM | POA: Diagnosis not present

## 2021-01-10 DIAGNOSIS — K51019 Ulcerative (chronic) pancolitis with unspecified complications: Secondary | ICD-10-CM

## 2021-01-10 NOTE — Progress Notes (Signed)
Referring Provider: Roderic Scarce, MD Primary Care Physician:  Roderic Scarce, MD Primary GI Physician: [facility]  Chief Complaint  Patient presents with   Ulcerative Colitis    Follow up on colitis. Pt states he is having some issues with nausea, abdominal pain, diarrhea, gas, bloating, heartburn, trouble swallowing. States appetite is good and has about 2 -3 stools a day. Gets hot and tired all the time.    HPI:   Herbert Morrison is a 75 y.o. 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   Patient presenting today for follow up of UC and GERD.   Ulcerative Colitis: (dx 1995, pancolitis) has been on apriso (made patient sick) and uceris in the past. Not currently on any medication for UC (appears last time he was on medication therapy was March 2017, Uceris, stopped due to cost, however, he reported good results). He reports that about 1 month ago he began having more loose stools, nausea with gas pains, lower abdominal pain and bloating. He reports some weight loss of 4-5 pounds over the past couple of weeks. Recently went on sulfa and prednisone for new diagnosis of lupus. Denies blood in stools, mucus or melena. Denies any changes in appetite or early satiety. He is since having  soft but formed stools now but no diarrhea. He does have fecal urgency at times not knowing if he has to have a BM or pass gas, endorses 2-3 BMs per day. He does endorse some fatigue and generally feeling poorly overall but not sure if this could be related to new diagnosis of Lupus.   GERD: was initially on Omeprazole 73m daily. States that he started having some issues with his reflux so he increased his PPI to BID which has helped some, however, he is taking second dose at bedtime. He is having some issues swallowing his pills, as they sometimes get stuck.  He does also endorse hoarse voice and sore  throat at times. Also has some issues with food getting stuck, any type of food will get stuck, sometimes it will pass with water and other times he has to cough it back up.  Patient had recent labs done this month with dr [place]   Last Colonoscopy:(04/09/16)- One small polyp in the cecum, removed with a cold snare. Complete resection. Polyp tissue not retrieved. - Normal mucosa in the entire examined colon. - Scar in the rectum and in the sigmoid colon. - Diverticulosis in the sigmoid colon. - The distal rectum and anal verge are normal on retroflexion view.  Last Endoscopy: (08/03/09) lower esophageal stricture, dilated, biopsies of gastritis (benign stratified squamous mucosa with minimal focal hyperplasia with mild mononuclear cell infiltrate with rare eosinophil admixed.  Recommendations:  Due for surveillance colonoscopy in nov 2022 Plan for EGD  Past Medical History:  Diagnosis Date   Anxiety    Arthritis    [place]    skin cancer removed Left shoulder Basal cell   Depression    GERD (gastroesophageal reflux disease)    Heart murmur    as a child   History of hiatal hernia    Hypertension    [place]upus [place] 12/31/2020   Pneumonia    [place]umatoid [place] 12/[place]   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([facility]     Past Surgical History:  Procedure Laterality Date   COLONOSCOPY N/A 04/09/2016   Procedure: COLONOSCOPY;  Surgeon: Rogene Houston, MD;  Location: [facility];  Service: Endoscopy;  Laterality: N/A;  1:45   JOINT REPLACEMENT     L hip Dr Alvan Dame 03/24/17   left elbow     spur removed   left knee surgery     open surgery for a meniscus tear 1970's   POLYPECTOMY  04/09/2016   Procedure: POLYPECTOMY;  Surgeon: Rogene Houston, MD;  Location: [facility];  Service: Endoscopy;;  cecal polyp   rt knee arthroscopy     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   Procedure: LEFT TOTAL HIP ARTHROPLASTY ANTERIOR APPROACH;  Surgeon: Paralee Cancel, MD;  Location: [facility] OR[facility];  Service: Orthopedics;  Laterality: Left;  70 mins   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   Procedure: TOTAL KNEE ARTHROPLASTY;  Surgeon: Paralee Cancel, MD;  Location: [facility];  Service: Orthopedics;  Laterality: Left;  70 mins    Current Outpatient Medications  Medication Sig Dispense Refill   amLODipine-valsartan (EXFORGE) 10-320 MG tablet Take 1 tablet by mouth daily.     buPROPion (WELLBUTRIN XL) 300 MG 24 hr tablet Take 300 mg by mouth daily.     cetirizine (ZYRTEC) 10 MG tablet Take 10 mg by mouth daily.     Cyanocobalamin (VITAMIN B12 PO) Take by mouth daily. 2500 mcg one daily     escitalopram (LEXAPRO) 20 MG tablet Take 20 mg by mouth daily.      fluticasone (FLONASE) 50 MCG/ACT nasal spray Place 1 spray into the nose daily.     hydrochlorothiazide (HYDRODIURIL) 25 MG tablet Take 25 mg by mouth daily.     metoprolol tartrate (LOPRESSOR) 50 MG tablet Take 50 mg by mouth daily.     omeprazole (PRILOSEC) 20 MG capsule Take 20 mg by mouth 2 (two) times daily before a meal.     prednisoLONE 5 MG TABS tablet Take by mouth daily.     sildenafil (REVATIO) 20 MG tablet Take 20 mg by mouth daily.     sulfaSALAzine (AZULFIDINE) 500 MG tablet Take 500 mg by mouth. 2 bid     zinc gluconate 50 MG tablet Take 50 mg by mouth daily.     ferrous sulfate (FERROUSUL) 325 (65 FE) MG tablet Take 1 tablet (325 mg total) by mouth 3 (three) times daily with meals for 14 days. 42 tablet 0   No current facility-administered medications for this visit.    Allergies as of 01/10/2021 - Review Complete 01/10/2021  Allergen Reaction Noted   Erythromycin Nausea Only 07/17/2014    History reviewed. No pertinent family history.  Social History   Socioeconomic History   Marital status: Married    Spouse name: Not on file   Number of children: Not on file   Years of education: Not on file   Highest education level: Not on file  Occupational History   Not on file  Tobacco  Use   Smoking status: Former   Smokeless tobacco: Former    Types: Chew    Quit date: 01/22/1965   Tobacco comments:    quit 43 years ago  Vaping Use   Vaping Use: Never used  Substance and Sexual Activity   Alcohol use: Yes    Alcohol/week: 0.0 standard drinks    Comment: occasionally   Drug use: No   Sexual activity: Yes  Other Topics Concern   Not on file  Social History Narrative   Not on file   Social Determinants of Health   Financial  Resource Strain: Not on file  Food Insecurity: Not on file  Transportation Needs: Not on file  Physical Activity: Not on file  Stress: Not on file  Social Connections: Not on file    Review of Systems: Gen: Denies fever, chills, anorexia.+fatigue, +weight loss CV: Denies chest pain, palpitations, syncope, peripheral edema, and claudication. Resp: Denies dyspnea at rest, cough, wheezing, coughing up blood, and pleurisy. GI: Denies vomiting blood, jaundice, and fecal incontinence. Denies dysphagia or odynophagia.+lower abdominal pain, denies diarrhea Derm: Denies rash, itching, dry skin Psych: Denies depression, anxiety, memory loss, confusion. No homicidal or suicidal ideation.  Heme: Denies bruising, bleeding, and enlarged lymph nodes.  Physical Exam: BP (!) 181/98 (BP Location: Right Arm, Patient Position: Sitting, Cuff Size: Large)   Pulse 70   Temp 98.1 F (36.7 C) (Oral)   Ht _0  (1.676 m)   Wt 212 lb (96.2 kg)   BMI 34.22 kg/m  General:   Alert and oriented. No distress noted. Pleasant and cooperative.  Head:  Normocephalic and atraumatic. Eyes:  Conjuctiva clear without scleral icterus. Mouth:  Oral mucosa pink and moist. Good dentition. No lesions. Heart: Normal rate and rhythm, s1 and s2 heart sounds present.  Lungs: Clear lung sounds in all lobes. Respirations equal and unlabored. Abdomen:  +BS, soft, non-tender and non-distended. No rebound or guarding. No HSM or masses noted. Derm: No palmar erythema or  jaundice Msk:  Symmetrical without gross deformities. Normal posture. Extremities:  Without edema. Neurologic:  Alert and  oriented x4 Psych:  Alert and cooperative. Normal mood and affect.  Invalid input(s): 6 MONTHS   ASSESSMENT: Herbert Morrison is a 75 y.o. male presenting today for follow up of UC and GERD  Was previously on omeprazole 79m once daily for GERD, he noticed recently that symptoms were not well controlled on once daily so he increased to twice daily with second dose being at bedtime, however, still having some reflux as well as hoarse voice and sore throat (he thinks these could be related to recent prednisone). I advised him to take first dose 30-45 mins before breakfast and 2nd dose 30-45 mins before dinner to see if symptoms are better controlled. He should also avoid eating late, stay upright 1-2 hours after dinner before going to bed. If reflux is still occurring, patient is to let uKoreaknow so that we can adjust dosing/add H2 blocker.  He also endorses some issues with dysphagia with medications as well as foods, not specific to any type of food. He sometimes has to cough food up and other times he can get it to pass with drinking water. Previous hx of esophageal stricture that required dilation. He denies any early satiety or changes in appetite. We will proceed with EGD for further evaluation of his dysphagia, as this could be related to esophageal stricture or esophagitis due to ongoing reflux that is not well controlled.   Hx of UC (dx 1995) has been in remission since 2017, not currently on any therapy for UC. Patient was recently diagnosed with lupus and started on sulfa and prednisone. States that he was having some diarrhea prior to starting prednisone. He reports 2-3 soft but formed stools now without melena, mucus or rectal bleeding but does endorse some lower abdominal pain, sometimes improved after BMs. He is also having some bloating and gas. He is not taking any  medication for these symptoms. He also reports feeling fatigued and "hot" all the time. These symptoms are likely related in  part to patient's lupus/medication therapy. He had recent labs done with Rheumatology which we will obtain records of. Will check ESR and CRP today and plan to go ahead with repeat surveillance colonoscopy at time of EGD for evaluation of UC status. no diarrhea at this time so we will hold off on stool studies unless this begins again.  Indications, risks and benefits of procedure discussed in detail with patient. Patient verbalized understanding and is in agreement to proceed with EGD/Colonoscopy at this time.   PLAN:  Proceed with colonoscopy/EGD  2. Will Check ESR, CRP 3. Continue Omeprazole 68m BID, make sure to take 30 mins before breakfast and dinner, please let uKoreaknow if reflux is not well controlled with twice daily dosing 4. Obtain labs from rheumatology   Follow Up: 3 months  Param Capri L. CAlver Sorrow MSN, APRN, AGNP-C Adult-Gerontology Nurse Practitioner [facility]

## 2021-01-10 NOTE — Patient Instructions (Addendum)
We will schedule you for colonoscopy and EGD We will obtain labs from rheumatologist, however, we should check some UC specific labs today. You can continue your omeprazole 37m twice a day, take first dose 30 mins before breakfast and second dose 30 mins before dinner. If this does not control your reflux, please let uKoreaknow.   Follow up in 3 months

## 2021-01-11 LAB — SEDIMENTATION RATE: Sed Rate: 9 mm/h (ref 0–20)

## 2021-01-11 LAB — C-REACTIVE PROTEIN: CRP: 27.4 mg/L — ABNORMAL HIGH (ref <8.0)

## 2021-01-18 ENCOUNTER — Telehealth (INDEPENDENT_AMBULATORY_CARE_PROVIDER_SITE_OTHER): Payer: Self-pay

## 2021-01-18 ENCOUNTER — Other Ambulatory Visit (INDEPENDENT_AMBULATORY_CARE_PROVIDER_SITE_OTHER): Payer: Self-pay

## 2021-01-18 DIAGNOSIS — R1319 Other dysphagia: Secondary | ICD-10-CM

## 2021-01-18 DIAGNOSIS — K51019 Ulcerative (chronic) pancolitis with unspecified complications: Secondary | ICD-10-CM

## 2021-01-18 MED ORDER — PEG 3350-KCL-NA BICARB-NACL 420 G PO SOLR: 4000.0000 mL | ORAL | 0 refills | Status: DC

## 2021-01-18 NOTE — Telephone Encounter (Signed)
Herbert Morrison, CMA  

## 2021-01-21 ENCOUNTER — Encounter (INDEPENDENT_AMBULATORY_CARE_PROVIDER_SITE_OTHER): Payer: Self-pay

## 2021-01-23 ENCOUNTER — Other Ambulatory Visit (INDEPENDENT_AMBULATORY_CARE_PROVIDER_SITE_OTHER): Payer: Self-pay

## 2021-01-23 NOTE — Patient Instructions (Signed)
Your procedure is scheduled on: 01/30/2021  Report to [facility] at     1:00  PM.  Call this number if you have problems the morning of surgery: [phone]   Remember:              Follow Directions on the letter you received from Your Physician's office regarding the Bowel Prep              No Smoking the day of Procedure :   Take these medicines the morning of surgery with A SIP OF WATER: Bupropion, Zyrtec, lexapro, flexeril, metoprolol, omeprazole and predisone   Do not wear jewelry, make-up or nail polish.    Do not bring valuables to the hospital.  Contacts, dentures or bridgework may not be worn into surgery.  .   Patients discharged the day of surgery will not be allowed to drive home.     Colonoscopy, Adult, Care After This sheet gives you information about how to care for yourself after your procedure. Your health care provider may also give you more specific instructions. If you have problems or questions, contact your health care provider. What can I expect after the procedure? After the procedure, it is common to have: A small amount of blood in your stool for 24 hours after the procedure. Some gas. Mild abdominal cramping or bloating.  Follow these instructions at home: General instructions  For the first 24 hours after the procedure: Do not drive or use machinery. Do not sign important documents. Do not drink alcohol. Do your regular daily activities at a slower pace than normal. Eat soft, easy-to-digest foods. Rest often. Take over-the-counter or prescription medicines only as told by your health care provider. It is up to you to get the results of your procedure. Ask your health care provider, or the department performing the procedure, when your results will be ready. Relieving cramping and bloating Try walking around when you have cramps or feel bloated. Apply heat to your abdomen as told by your health care provider. Use a heat source that your health  care provider recommends, such as a moist heat pack or a heating pad. Place a towel between your skin and the heat source. Leave the heat on for 20-30 minutes. Remove the heat if your skin turns bright red. This is especially important if you are unable to feel pain, heat, or cold. You may have a greater risk of getting burned. Eating and drinking Drink enough fluid to keep your urine clear or pale yellow. Resume your normal diet as instructed by your health care provider. Avoid heavy or fried foods that are hard to digest. Avoid drinking alcohol for as long as instructed by your health care provider. Contact a health care provider if: You have blood in your stool 2-3 days after the procedure. Get help right away if: You have more than a small spotting of blood in your stool. You pass large blood clots in your stool. Your abdomen is swollen. You have nausea or vomiting. You have a fever. You have increasing abdominal pain that is not relieved with medicine. This information is not intended to replace advice given to you by your health care provider. Make sure you discuss any questions you have with your health care provider. Document Released: 12/11/2003 Document Revised: 01/21/2016 Document Reviewed: 07/10/2015 Elsevier Interactive Patient Education  2018 [place] [facility], Adult, Care After This sheet gives you information about how to care for yourself after your  procedure. Your health care provider may also give you more specific instructions. If you have problems or questions, contact your health care provider. What can I expect after the procedure? After the procedure, it is common to have: A sore throat. Mild stomach pain or discomfort. Bloating. Nausea. Follow these instructions at home:  Follow instructions from your health care provider about what to eat or drink after your procedure. Return to your normal activities as told by your health care provider. Ask  your health care provider what activities are safe for you. Take over-the-counter and prescription medicines only as told by your health care provider. If you were given a sedative during the procedure, it can affect you for several hours. Do not drive or operate machinery until your health care provider says that it is safe. Keep all follow-up visits as told by your health care provider. This is important. Contact a health care provider if you have: A sore throat that lasts longer than one day. Trouble swallowing. Get help right away if: You vomit blood or your vomit looks like coffee grounds. You have: A fever. Bloody, black, or tarry stools. A severe sore throat or you cannot swallow. Difficulty breathing. Severe pain in your chest or abdomen. Summary After the procedure, it is common to have a sore throat, mild stomach discomfort, bloating, and nausea. If you were given a sedative during the procedure, it can affect you for several hours. Do not drive or operate machinery until your health care provider says that it is safe. Follow instructions from your health care provider about what to eat or drink after your procedure. Return to your normal activities as told by your health care provider. This information is not intended to replace advice given to you by your health care provider. Make sure you discuss any questions you have with your health care provider. Document Revised: 04/26/2019 Document Reviewed: 09/28/2017 Elsevier Patient Education  2022 [employer].

## 2021-01-28 ENCOUNTER — Other Ambulatory Visit: Payer: Self-pay

## 2021-01-28 ENCOUNTER — Encounter (HOSPITAL_COMMUNITY)
Admission: RE | Admit: 2021-01-28 | Discharge: 2021-01-28 | Disposition: A | Discharge status: Home / Self Care | Payer: Medicare Other | Source: Ambulatory Visit | Attending: Internal Medicine | Admitting: Internal Medicine

## 2021-01-28 DIAGNOSIS — Z01812 Encounter for preprocedural laboratory examination: Secondary | ICD-10-CM | POA: Diagnosis not present

## 2021-01-28 DIAGNOSIS — K51019 Ulcerative (chronic) pancolitis with unspecified complications: Secondary | ICD-10-CM | POA: Diagnosis not present

## 2021-01-28 DIAGNOSIS — R1319 Other dysphagia: Secondary | ICD-10-CM | POA: Diagnosis not present

## 2021-01-28 LAB — BASIC METABOLIC PANEL
Anion gap: 6 (ref 5–15)
BUN: 16 mg/dL (ref 8–23)
CO2: 25 mmol/L (ref 22–32)
Calcium: 8.5 mg/dL — ABNORMAL LOW (ref 8.9–10.3)
Chloride: 105 mmol/L (ref 98–111)
Creatinine, Ser: 1.26 mg/dL — ABNORMAL HIGH (ref 0.61–1.24)
GFR, Estimated: >60 mL/min (ref >60)
Glucose, Bld: 95 mg/dL (ref 70–99)
Potassium: 4 mmol/L (ref 3.5–5.1)
Sodium: 136 mmol/L (ref 135–145)

## 2021-01-30 ENCOUNTER — Ambulatory Visit (HOSPITAL_COMMUNITY)
Admission: RE | Admit: 2021-01-30 | Discharge: 2021-01-30 | Disposition: A | Discharge status: Home / Self Care | Payer: Medicare Other | Attending: Internal Medicine | Admitting: Internal Medicine

## 2021-01-30 ENCOUNTER — Ambulatory Visit (HOSPITAL_COMMUNITY): Payer: Medicare Other | Admitting: Anesthesiology

## 2021-01-30 ENCOUNTER — Encounter (HOSPITAL_COMMUNITY)
Admission: RE | Disposition: A | Discharge status: Home / Self Care | Payer: Self-pay | Source: Home / Self Care | Attending: Internal Medicine

## 2021-01-30 ENCOUNTER — Other Ambulatory Visit: Payer: Self-pay

## 2021-01-30 ENCOUNTER — Encounter (HOSPITAL_COMMUNITY): Payer: Self-pay | Admitting: Internal Medicine

## 2021-01-30 DIAGNOSIS — K5750 Diverticulosis of both small and large intestine without perforation or abscess without bleeding: Secondary | ICD-10-CM | POA: Diagnosis not present

## 2021-01-30 DIAGNOSIS — Z09 Encounter for follow-up examination after completed treatment for conditions other than malignant neoplasm: Secondary | ICD-10-CM | POA: Diagnosis not present

## 2021-01-30 DIAGNOSIS — K222 Esophageal obstruction: Secondary | ICD-10-CM | POA: Diagnosis not present

## 2021-01-30 DIAGNOSIS — R1314 Dysphagia, pharyngoesophageal phase: Secondary | ICD-10-CM | POA: Diagnosis not present

## 2021-01-30 DIAGNOSIS — K529 Noninfective gastroenteritis and colitis, unspecified: Secondary | ICD-10-CM | POA: Insufficient documentation

## 2021-01-30 DIAGNOSIS — K6389 Other specified diseases of intestine: Secondary | ICD-10-CM | POA: Diagnosis not present

## 2021-01-30 DIAGNOSIS — K5710 Diverticulosis of small intestine without perforation or abscess without bleeding: Secondary | ICD-10-CM | POA: Diagnosis not present

## 2021-01-30 DIAGNOSIS — Z1211 Encounter for screening for malignant neoplasm of colon: Secondary | ICD-10-CM | POA: Insufficient documentation

## 2021-01-30 DIAGNOSIS — Z79899 Other long term (current) drug therapy: Secondary | ICD-10-CM | POA: Diagnosis not present

## 2021-01-30 DIAGNOSIS — D122 Benign neoplasm of ascending colon: Secondary | ICD-10-CM | POA: Diagnosis not present

## 2021-01-30 DIAGNOSIS — K644 Residual hemorrhoidal skin tags: Secondary | ICD-10-CM | POA: Insufficient documentation

## 2021-01-30 DIAGNOSIS — Z882 Allergy status to sulfonamides status: Secondary | ICD-10-CM | POA: Insufficient documentation

## 2021-01-30 DIAGNOSIS — K5150 Left sided colitis without complications: Secondary | ICD-10-CM | POA: Insufficient documentation

## 2021-01-30 DIAGNOSIS — K51019 Ulcerative (chronic) pancolitis with unspecified complications: Secondary | ICD-10-CM

## 2021-01-30 DIAGNOSIS — Z881 Allergy status to other antibiotic agents status: Secondary | ICD-10-CM | POA: Insufficient documentation

## 2021-01-30 DIAGNOSIS — Z87891 Personal history of nicotine dependence: Secondary | ICD-10-CM | POA: Diagnosis not present

## 2021-01-30 DIAGNOSIS — K219 Gastro-esophageal reflux disease without esophagitis: Secondary | ICD-10-CM | POA: Diagnosis not present

## 2021-01-30 DIAGNOSIS — K449 Diaphragmatic hernia without obstruction or gangrene: Secondary | ICD-10-CM | POA: Insufficient documentation

## 2021-01-30 DIAGNOSIS — R1319 Other dysphagia: Secondary | ICD-10-CM

## 2021-01-30 HISTORY — PX: ESOPHAGOGASTRODUODENOSCOPY (EGD) WITH PROPOFOL: SHX5813

## 2021-01-30 HISTORY — PX: BIOPSY: SHX5522

## 2021-01-30 HISTORY — PX: COLONOSCOPY WITH PROPOFOL: SHX5780

## 2021-01-30 HISTORY — PX: POLYPECTOMY: SHX5525

## 2021-01-30 LAB — HM COLONOSCOPY

## 2021-01-30 SURGERY — ESOPHAGOGASTRODUODENOSCOPY (EGD) WITH PROPOFOL
Anesthesia: General

## 2021-01-30 MED ORDER — PHENYLEPHRINE 40 MCG/ML (10ML) SYRINGE FOR IV PUSH (FOR BLOOD PRESSURE SUPPORT)
PREFILLED_SYRINGE | INTRAVENOUS | Status: AC
  Filled 2021-01-30: qty 10

## 2021-01-30 MED ORDER — PHENYLEPHRINE 40 MCG/ML (10ML) SYRINGE FOR IV PUSH (FOR BLOOD PRESSURE SUPPORT)
PREFILLED_SYRINGE | INTRAVENOUS | Status: DC | PRN
  Administered 2021-01-30 (×4): 80 ug via INTRAVENOUS

## 2021-01-30 MED ORDER — PROPOFOL 10 MG/ML IV BOLUS
INTRAVENOUS | Status: DC | PRN
Start: 2021-01-30 — End: 2021-01-30
  Administered 2021-01-30: 100 mg via INTRAVENOUS
  Administered 2021-01-30: 40 mg via INTRAVENOUS
  Administered 2021-01-30 (×2): 50 mg via INTRAVENOUS

## 2021-01-30 MED ORDER — EPHEDRINE 5 MG/ML INJ
INTRAVENOUS | Status: AC
  Filled 2021-01-30: qty 5

## 2021-01-30 MED ORDER — EPHEDRINE SULFATE-NACL 50-0.9 MG/10ML-% IV SOSY
PREFILLED_SYRINGE | INTRAVENOUS | Status: DC | PRN
  Administered 2021-01-30 (×2): 5 mg via INTRAVENOUS

## 2021-01-30 MED ORDER — PROPOFOL 500 MG/50ML IV EMUL
INTRAVENOUS | Status: DC | PRN
  Administered 2021-01-30: 150 ug/kg/min via INTRAVENOUS

## 2021-01-30 MED ORDER — LACTATED RINGERS IV SOLN: INTRAVENOUS | Status: DC

## 2021-01-30 MED ORDER — LIDOCAINE HCL (CARDIAC) PF 100 MG/5ML IV SOSY
PREFILLED_SYRINGE | INTRAVENOUS | Status: DC | PRN
  Administered 2021-01-30: 50 mg via INTRAVENOUS

## 2021-01-30 MED ORDER — STERILE WATER FOR IRRIGATION IR SOLN
Status: DC | PRN
  Administered 2021-01-30: 200 mL

## 2021-01-30 NOTE — Anesthesia Postprocedure Evaluation (Signed)
Anesthesia Post Note  Patient: Herbert Morrison  Procedure(s) Performed: ESOPHAGOGASTRODUODENOSCOPY (EGD) WITH PROPOFOL COLONOSCOPY WITH PROPOFOL POLYPECTOMY BIOPSY  Patient location during evaluation: Phase II Anesthesia Type: General Level of consciousness: awake Pain management: pain level controlled Vital Signs Assessment: post-procedure vital signs reviewed and stable Respiratory status: spontaneous breathing and respiratory function stable Cardiovascular status: blood pressure returned to baseline and stable Postop Assessment: no headache and no apparent nausea or vomiting Anesthetic complications: no Comments: Late entry   No notable events documented.   Last Vitals:  Vitals:   01/30/21 1107 01/30/21 1400  BP: (!) 187/95 117/67  Pulse: (!) 58 60  Resp: 18 15  Temp: 36.7 C 36.6 C  SpO2: 98% 94%    Last Pain:  Vitals:   01/30/21 1400  TempSrc: Axillary  PainSc: 0-No pain                 Louann Sjogren

## 2021-01-30 NOTE — Op Note (Signed)
[facility] Patient Name: Herbert Morrison Procedure Date: 01/30/2021 12:57 PM MRN: [id number] Date of Birth: 07/14/1945 Attending MD: Hildred Laser , MD CSN: [id number] Age: 75 Admit Type: Outpatient Procedure:                Upper GI endoscopy Indications:              Esophageal dysphagia Providers:                Hildred Laser, MD, Caprice Kluver, Randa Spike,                            Technician Referring MD:             Roderic Scarce, MD Medicines:                Propofol per Anesthesia Complications:            No immediate complications. Estimated Blood Loss:     Estimated blood loss: none. Procedure:                Pre-Anesthesia Assessment:                           - Prior to the procedure, a History and Physical                            was performed, and patient medications and                            allergies were reviewed. The patient's tolerance of                            previous anesthesia was also reviewed. The risks                            and benefits of the procedure and the sedation                            options and risks were discussed with the patient.                            All questions were answered, and informed consent                            was obtained. Prior Anticoagulants: The patient has                            taken no previous anticoagulant or antiplatelet                            agents except for NSAID medication. ASA Grade                            Assessment: II - A patient with mild systemic  disease. After reviewing the risks and benefits,                            the patient was deemed in satisfactory condition to                            undergo the procedure.                           After obtaining informed consent, the endoscope was                            passed under direct vision. Throughout the                            procedure, the patient's blood pressure,  pulse, and                            oxygen saturations were monitored continuously. The                            [id number]-H190 ([id number]) scope was introduced through the                            mouth, and advanced to the second part of duodenum.                            The upper GI endoscopy was accomplished without                            difficulty. The patient tolerated the procedure                            well. Scope In: 1:00:20 PM Scope Out: 1:19:28 PM Total Procedure Duration: 0 hours 19 minutes 8 seconds  Findings:      The hypopharynx was normal.      The examined esophagus was normal.      A widely patent Schatzki ring was found at the gastroesophageal junction       at 35 cm from the incisors.dilation attempted with 70 Fr. Maloney       dilator but could not pass it beyond 35 cm. therefore ring dilated with       balloon dilator from 18 to 20 mm.The dilation site was examined and       showed no change and no bleeding, mucosal tear or perforation. Ring       disrupted with focal biopsy but no tissue saved.      A 7 cm hiatal hernia was present.      The entire examined stomach was normal.      The duodenal bulb was normal.      A medium non-bleeding diverticulum was found in the second portion of       the duodenum.      The exam of the duodenum was otherwise normal. Impression:               - Normal hypopharynx.                           -  Normal esophagus.                           - Widely patent Schatzki ring. Dilated and                            disrupted.                           - 7 cm hiatal hernia.                           - Normal stomach.                           - Normal duodenal bulb.                           - Non-bleeding duodenal diverticulum.                           - No specimens collected. Moderate Sedation:      Per Anesthesia Care Recommendation:           - Patient has a contact number available for                             emergencies. The signs and symptoms of potential                            delayed complications were discussed with the                            patient. Return to normal activities tomorrow.                            Written discharge instructions were provided to the                            patient.                           - Resume previous diet today.                           - Continue present medications.                           - Return to GI clinic in 1 week. Procedure Code(s):        --- Professional ---                           [phone], Esophagogastroduodenoscopy, flexible,                            transoral; diagnostic, including collection of  specimen(s) by brushing or washing, when performed                            (separate procedure) Diagnosis Code(s):        --- Professional ---                           K22.2, Esophageal obstruction                           K44.9, Diaphragmatic hernia without obstruction or                            gangrene                           R13.14, Dysphagia, pharyngoesophageal phase                           K57.10, Diverticulosis of small intestine without                            perforation or abscess without bleeding CPT copyright 2019 American Medical Association. All rights reserved. The codes documented in this report are preliminary and upon coder review may  be revised to meet current compliance requirements. Hildred Laser, MD Hildred Laser, MD 01/30/2021 2:10:09 PM This report has been signed electronically. Number of Addenda: 0

## 2021-01-30 NOTE — Transfer of Care (Signed)
Immediate Anesthesia Transfer of Care Note  Patient: Herbert Morrison  Procedure(s) Performed: ESOPHAGOGASTRODUODENOSCOPY (EGD) WITH PROPOFOL COLONOSCOPY WITH PROPOFOL POLYPECTOMY BIOPSY  Patient Location: Short Stay  Anesthesia Type:General  Level of Consciousness: drowsy  Airway & Oxygen Therapy: Patient Spontanous Breathing  Post-op Assessment: Report given to RN and Post -op Vital signs reviewed and stable  Post vital signs: Reviewed and stable  Last Vitals:  Vitals Value Taken Time  BP    Temp    Pulse    Resp    SpO2      Last Pain:  Vitals:   01/30/21 1257  TempSrc:   PainSc: 0-No pain      Patients Stated Pain Goal: 8 (94/07/68 0881)  Complications: No notable events documented.

## 2021-01-30 NOTE — Anesthesia Preprocedure Evaluation (Signed)
Anesthesia Evaluation  Patient identified by MRN, date of birth, ID band Patient awake    Reviewed: Allergy & Precautions, H&P , NPO status , Patient's Chart, lab work & pertinent test results, reviewed documented beta blocker date and time   Airway Mallampati: II  TM Distance: >3 FB Neck ROM: full    Dental no notable dental hx.    Pulmonary neg pulmonary ROS, former smoker,    Pulmonary exam normal breath sounds clear to auscultation       Cardiovascular Exercise Tolerance: Good hypertension, negative cardio ROS   Rhythm:regular Rate:Normal     Neuro/Psych PSYCHIATRIC DISORDERS Anxiety Depression negative neurological ROS     GI/Hepatic Neg liver ROS, hiatal hernia, PUD, GERD  Medicated,  Endo/Other  negative endocrine ROS  Renal/GU negative Renal ROS  negative genitourinary   Musculoskeletal   Abdominal   Peds  Hematology negative hematology ROS (+)   Anesthesia Other Findings   Reproductive/Obstetrics negative OB ROS                             Anesthesia Physical Anesthesia Plan  ASA: 2  Anesthesia Plan: General   Post-op Pain Management:    Induction:   PONV Risk Score and Plan: Propofol infusion  Airway Management Planned:   Additional Equipment:   Intra-op Plan:   Post-operative Plan:   Informed Consent: I have reviewed the patients History and Physical, chart, labs and discussed the procedure including the risks, benefits and alternatives for the proposed anesthesia with the patient or authorized representative who has indicated his/her understanding and acceptance.     Dental Advisory Given  Plan Discussed with: CRNA  Anesthesia Plan Comments:         Anesthesia Quick Evaluation

## 2021-01-30 NOTE — H&P (Signed)
Herbert Morrison is an 75 y.o. male.   Chief Complaint: Patient is here for esophagogastroduodenoscopy with esophageal dilation followed by colonoscopy. HPI: Patient is 75 year old Caucasian male who has had GERD for 7 years which symptoms are poorly controlled with single dose PPI.  He is doing better with double dose PPI.  He was having heartburn and nocturnal regurgitation.  He also complains of dysphagia.  He says he has had dysphagia off and on for years but lately has become more frequent.  He has most difficulty with bread.  He points to suprasternal area sort of bolus obstruction.  No history of hematemesis or melena.  He has chronic ulcerative colitis.  He was diagnosed about 27 or 28 years ago.  He has remained in remission off therapy.  Endoscopic remission was diagnosed on last colonoscopy of November 2017. He states he did have diarrhea a few weeks ago when he took sulfa-based antibiotic but his diarrhea resolved since stopping antibiotic. Family history is negative for CRC or IBD.  Past Medical History:  Diagnosis Date   Anxiety    Arthritis    Cancer ([place])    skin cancer removed Left shoulder Basal cell   Depression    GERD (gastroesophageal reflux disease)    Heart murmur    as a child   History of hiatal hernia    Hypertension    Lupus ([place]) 12/31/2020   Pneumonia    Rheumatoid arthritis ([place]) 12/31/2020   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([place])     Past Surgical History:  Procedure Laterality Date   COLONOSCOPY N/A 04/09/2016   Procedure: COLONOSCOPY;  Surgeon: Rogene Houston, MD;  Location: [facility];  Service: Endoscopy;  Laterality: N/A;  1:45   JOINT REPLACEMENT     L hip Dr Alvan Dame 03/24/17   left elbow     spur removed   left knee surgery     open surgery for a meniscus tear 1970's   POLYPECTOMY  04/09/2016   Procedure: POLYPECTOMY;  Surgeon: Rogene Houston, MD;  Location: [facility];  Service: Endoscopy;;  cecal polyp   rt knee arthroscopy     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 No pertinent family history. Social History:  reports that he has quit smoking. He quit smokeless tobacco use about 56 years ago.  His smokeless tobacco use included chew. He reports current alcohol use. He reports that he does not use drugs.  Allergies:  Allergies  Allergen Reactions   Erythromycin Nausea Only   Sulfasalazine Nausea And Vomiting    Medications Prior to Admission  Medication Sig Dispense Refill   Alpha-D-Galactosidase (BEANO) TABS Take 1 tablet by mouth 2 (two) times daily with a meal.     amLODipine-valsartan (EXFORGE) 10-320 MG tablet Take 1 tablet by mouth daily.     ammonium lactate (AMLACTIN) 12 % cream Apply 1 application topically daily as needed for dry skin.     buPROPion (WELLBUTRIN XL) 300 MG 24 hr tablet Take 300 mg by mouth daily.     cetirizine (ZYRTEC) 10 MG tablet Take 10 mg by mouth daily.  Cyanocobalamin (B-12) 2500 MCG SUBL Place 2,500 tablets under the tongue daily.     docusate sodium (COLACE) 100 MG capsule Take 200 mg by mouth daily.     escitalopram (LEXAPRO) 20 MG tablet Take 20 mg by mouth daily.      fluticasone (FLONASE) 50 MCG/ACT nasal spray Place 1 spray into both nostrils daily.     hydrochlorothiazide (HYDRODIURIL) 25 MG tablet Take 25 mg by mouth daily.     ibuprofen (ADVIL) 200 MG tablet Take 600 mg by mouth daily as needed for mild pain (Joint inflammation).     metoprolol succinate (TOPROL-XL) 50 MG 24 hr tablet Take 50 mg by mouth daily. Take with or immediately following a meal.     omeprazole (PRILOSEC) 20 MG capsule Take 20 mg by mouth 2 (two)  times daily before a meal.     polyethylene glycol-electrolytes (TRILYTE) 420 g solution Take 4,000 mLs by mouth as directed. 4000 mL 0   predniSONE (DELTASONE) 5 MG tablet Take 5 mg by mouth daily.     zinc gluconate 50 MG tablet Take 50 mg by mouth daily.     cyclobenzaprine (FLEXERIL) 10 MG tablet Take 10 mg by mouth 3 (three) times daily as needed for muscle spasms.      Results for orders placed or performed during the hospital encounter of 01/28/21 (from the past 48 hour(s))  Basic metabolic panel     Status: Abnormal   Collection Time: 01/28/21  2:45 PM  Result Value Ref Range   Sodium 136 135 - 145 mmol/L   Potassium 4.0 3.5 - 5.1 mmol/L   Chloride 105 98 - 111 mmol/L   CO2 25 22 - 32 mmol/L   Glucose, Bld 95 70 - 99 mg/dL    Comment: Glucose reference range applies only to samples taken after fasting for at least 8 hours.   BUN 16 8 - 23 mg/dL   Creatinine, Ser 1.26 (H) 0.61 - 1.24 mg/dL   Calcium 8.5 (L) 8.9 - 10.3 mg/dL   GFR, Estimated >60 >60 mL/min    Comment: (NOTE) Calculated using the CKD-EPI Creatinine Equation (2021)    Anion gap 6 5 - 15    Comment: Performed at [facility], [address], [place], [place] [address]   No results found.  Review of Systems  Blood pressure (!) 187/95, pulse (!) 58, temperature 98.1 F (36.7 C), temperature source Oral, resp. rate 18, SpO2 98 %. Physical Exam HENT:     Mouth/Throat:     Mouth: Mucous membranes are moist.     Pharynx: Oropharynx is clear.  Eyes:     General: No scleral icterus.    Conjunctiva/sclera: Conjunctivae normal.  Cardiovascular:     Rate and Rhythm: Normal rate and regular rhythm.     Heart sounds: Normal heart sounds. No murmur heard. Pulmonary:     Effort: Pulmonary effort is normal.     Breath sounds: Normal breath sounds.  Abdominal:     Comments: Abdomen is full.  Small umbilical hernia noted.  On palpation is soft and nontender with organomegaly or masses.  Musculoskeletal:         General: No swelling.     Cervical back: Neck supple.  Lymphadenopathy:     Cervical: No cervical adenopathy.  Skin:    General: Skin is warm and dry.  Neurological:     Mental Status: He is alert.     Assessment/Plan  Esophageal dysphagia in patient with chronic GERD.  Chronic ulcerative colitis; left-sided disease.  Disease duration 27 or 28 years. Esophagogastroduodenoscopy with esophageal dilation and surveillance colonoscopy.  Hildred Laser, MD 01/30/2021, 12:54 PM

## 2021-01-30 NOTE — Discharge Instructions (Signed)
No aspirin or NSAIDs for 48 hours.  Please do not take ibuprofen unless absolutely necessary Resume other medications as before. High-fiber diet. No driving for 24 hours. Physician will call with biopsy results.

## 2021-01-30 NOTE — Anesthesia Procedure Notes (Signed)
Date/Time: 01/30/2021 1:02 PM Performed by: Orlie Dakin, CRNA Pre-anesthesia Checklist: Patient identified, Emergency Drugs available, Suction available and Patient being monitored Patient Re-evaluated:Patient Re-evaluated prior to induction Oxygen Delivery Method: Nasal cannula Induction Type: IV induction Placement Confirmation: positive ETCO2

## 2021-01-30 NOTE — Op Note (Signed)
[facility] Patient Name: Herbert Morrison Procedure Date: 01/30/2021 1:21 PM MRN: [id number] Date of Birth: 04-11-1946 Attending MD: Hildred Laser , MD CSN: [id number] Age: 75 Admit Type: Outpatient Procedure:                Colonoscopy Indications:              High risk colon cancer surveillance: Ulcerative                            left sided colitis of 8 (or more) years duration Providers:                Hildred Laser, MD, Caprice Kluver, Randa Spike,                            Technician Referring MD:             Roderic Scarce, MD Medicines:                Propofol per Anesthesia Complications:            No immediate complications. Estimated Blood Loss:     Estimated blood loss was minimal. Procedure:                Pre-Anesthesia Assessment:                           - Prior to the procedure, a History and Physical                            was performed, and patient medications and                            allergies were reviewed. The patient's tolerance of                            previous anesthesia was also reviewed. The risks                            and benefits of the procedure and the sedation                            options and risks were discussed with the patient.                            All questions were answered, and informed consent                            was obtained. Prior Anticoagulants: The patient has                            taken no previous anticoagulant or antiplatelet                            agents except for NSAID medication. ASA Grade  Assessment: II - A patient with mild systemic                            disease. After reviewing the risks and benefits,                            the patient was deemed in satisfactory condition to                            undergo the procedure.                           After obtaining informed consent, the colonoscope                            was passed  under direct vision. Throughout the                            procedure, the patient's blood pressure, pulse, and                            oxygen saturations were monitored continuously. The                            [id number]-HQ190L ([id number]) scope was introduced through                            the anus and advanced to the the cecum, identified                            by appendiceal orifice and ileocecal valve. The                            colonoscopy was performed without difficulty. The                            patient tolerated the procedure well. The quality                            of the bowel preparation was adequate. The                            ileocecal valve, appendiceal orifice, and rectum                            were photographed. Scope In: 1:22:59 PM Scope Out: 1:56:03 PM Scope Withdrawal Time: 0 hours 23 minutes 16 seconds  Total Procedure Duration: 0 hours 33 minutes 4 seconds  Findings:      Skin tags were found on perianal exam.      A 4 to 6 mm polyp was found in the proximal ascending colon. The polyp       was sessile. Biopsies were taken with a cold forceps for histology. The       pathology specimen was placed into Bottle Number 1.  A 6 mm polyp was found in the mid ascending colon. The polyp was       sessile. The polyp was removed with a cold snare. Resection and       retrieval were complete. The pathology specimen was placed into Bottle       Number 2.      A 7 mm polyp was found in the distal ascending colon. The polyp was       sessile. The polyp was removed with a cold snare. Resection and       retrieval were complete. The pathology specimen was placed into Bottle       Number 3.      An area of congested mucosa was found in the ascending colon. This was       biopsied with a cold forceps for histology. The pathology specimen was       placed into Bottle Number 1.      A localized area of nodular mucosa was found at the hepatic  flexure.       This was biopsied with a cold forceps for histology. The pathology       specimen was placed into Bottle Number 5.      Localized mild inflammation characterized by congestion (edema),       erosions, erythema and granularity was found in the proximal transverse       colon. Biopsies were taken with a cold forceps for histology. The       pathology specimen was placed into Bottle Number 6.      Scattered diverticula were found in the sigmoid colon.      External hemorrhoids were found during retroflexion. The hemorrhoids       were small. Impression:               - Perianal skin tags found on perianal exam.                           - One 4 to 6 mm polyp in the proximal ascending                            colon. Biopsied.                           - One 6 mm polyp in the mid ascending colon,                            removed with a cold snare. Resected and retrieved.                           - One 7 mm polyp in the distal ascending colon,                            removed with a cold snare. Resected and retrieved.                           - Congested mucosa in the ascending colon. Biopsied.                           - Nodular mucosa at the hepatic flexure.  Biopsied.                           - Localized mild inflammation was found in the                            proximal transverse colon. Biopsied.                           - Diverticulosis in the sigmoid colon.                           - External hemorrhoids. Moderate Sedation:      Per Anesthesia Care Recommendation:           - Patient has a contact number available for                            emergencies. The signs and symptoms of potential                            delayed complications were discussed with the                            patient. Return to normal activities tomorrow.                            Written discharge instructions were provided to the                            patient.                            - High fiber diet today.                           - Continue present medications.                           - No aspirin, ibuprofen, naproxen, or other                            non-steroidal anti-inflammatory drugs for 2 days.                           - Await pathology results.                           - Repeat colonoscopy is recommended. The                            colonoscopy date will be determined after pathology                            results from today's exam become available for  review. Procedure Code(s):        --- Professional ---                           [phone], Colonoscopy, flexible; with removal of                            tumor(s), polyp(s), or other lesion(s) by snare                            technique                           [id number], 32, Colonoscopy, flexible; with biopsy,                            single or multiple Diagnosis Code(s):        --- Professional ---                           K63.89, Other specified diseases of intestine                           K64.4, Residual hemorrhoidal skin tags                           K51.50, Left sided colitis without complications                           K63.5, Polyp of colon                           K52.9, Noninfective gastroenteritis and colitis,                            unspecified                           K57.30, Diverticulosis of large intestine without                            perforation or abscess without bleeding CPT copyright 2019 [employer]. All rights reserved. The codes documented in this report are preliminary and upon coder review may  be revised to meet current compliance requirements. Hildred Laser, MD Hildred Laser, MD 01/30/2021 2:17:34 PM This report has been signed electronically. Number of Addenda: 0

## 2021-02-01 LAB — SURGICAL PATHOLOGY

## 2021-02-02 ENCOUNTER — Other Ambulatory Visit (INDEPENDENT_AMBULATORY_CARE_PROVIDER_SITE_OTHER): Payer: Self-pay | Admitting: Internal Medicine

## 2021-02-02 MED ORDER — MESALAMINE ER 0.375 G PO CP24: 1500.0000 mg | ORAL_CAPSULE | Freq: Every day | ORAL | 5 refills | Status: DC

## 2021-02-04 ENCOUNTER — Encounter (HOSPITAL_COMMUNITY): Payer: Self-pay | Admitting: Internal Medicine

## 2021-02-04 ENCOUNTER — Encounter (INDEPENDENT_AMBULATORY_CARE_PROVIDER_SITE_OTHER): Payer: Self-pay | Admitting: *Deleted

## 2021-02-12 ENCOUNTER — Encounter (INDEPENDENT_AMBULATORY_CARE_PROVIDER_SITE_OTHER): Payer: Self-pay

## 2021-02-26 ENCOUNTER — Telehealth (INDEPENDENT_AMBULATORY_CARE_PROVIDER_SITE_OTHER): Payer: Self-pay | Admitting: *Deleted

## 2021-02-26 ENCOUNTER — Other Ambulatory Visit (INDEPENDENT_AMBULATORY_CARE_PROVIDER_SITE_OTHER): Payer: Self-pay | Admitting: Internal Medicine

## 2021-02-26 ENCOUNTER — Encounter (INDEPENDENT_AMBULATORY_CARE_PROVIDER_SITE_OTHER): Payer: Self-pay | Admitting: *Deleted

## 2021-02-26 NOTE — Telephone Encounter (Signed)
Per dr Laural Golden - stop mesalamine and do fecal calprotein in 3 months. Called pt and he was notified. Fecal calprotein added to reminder file.

## 2021-02-26 NOTE — Telephone Encounter (Signed)
Pt wanted to let dr Laural Golden know that dr Scarlette Shorts in [place] that he sees for lupus changed his mesalmine to methotrexate 2.19m 5 tablets weeklybecause the mesalmine is sulfur based and he is allergic. States he was having stomach cramps and severe diarrhea since starting the med and has stopped and taken 2nd dose of methotrexate and diarrhea and stomach cramps have stopped.

## 2021-04-08 ENCOUNTER — Other Ambulatory Visit (INDEPENDENT_AMBULATORY_CARE_PROVIDER_SITE_OTHER): Payer: Self-pay

## 2021-04-08 ENCOUNTER — Encounter (INDEPENDENT_AMBULATORY_CARE_PROVIDER_SITE_OTHER): Payer: Self-pay

## 2021-04-08 DIAGNOSIS — K5120 Ulcerative (chronic) proctitis without complications: Secondary | ICD-10-CM

## 2021-04-15 ENCOUNTER — Ambulatory Visit (INDEPENDENT_AMBULATORY_CARE_PROVIDER_SITE_OTHER): Payer: Medicare Other | Admitting: Gastroenterology

## 2021-04-15 ENCOUNTER — Other Ambulatory Visit: Payer: Self-pay

## 2021-04-15 ENCOUNTER — Encounter (INDEPENDENT_AMBULATORY_CARE_PROVIDER_SITE_OTHER): Payer: Self-pay | Admitting: Gastroenterology

## 2021-04-15 VITALS — BP 118/71 | HR 59 | Temp 97.2°F | Ht 66.0 in | Wt 220.7 lb

## 2021-04-15 DIAGNOSIS — Z1321 Encounter for screening for nutritional disorder: Secondary | ICD-10-CM | POA: Diagnosis not present

## 2021-04-15 DIAGNOSIS — E559 Vitamin D deficiency, unspecified: Secondary | ICD-10-CM

## 2021-04-15 DIAGNOSIS — K51019 Ulcerative (chronic) pancolitis with unspecified complications: Secondary | ICD-10-CM | POA: Diagnosis not present

## 2021-04-15 DIAGNOSIS — K219 Gastro-esophageal reflux disease without esophagitis: Secondary | ICD-10-CM | POA: Diagnosis not present

## 2021-04-15 MED ORDER — MESALAMINE 1.2 G PO TBEC: 4.8000 g | DELAYED_RELEASE_TABLET | Freq: Every day | ORAL | 3 refills | Status: DC

## 2021-04-15 NOTE — Patient Instructions (Addendum)
Increase omeprazole to 40 mg twice a day Start Lialda 4.8 g qday Perform blood workup

## 2021-04-15 NOTE — Progress Notes (Signed)
Herbert Morrison, M.D. Gastroenterology & Hepatology [facility] [address] [place], [place] [address]  Primary Care Physician: Herbert Scarce, [place] [id number]  I will communicate my assessment and recommendations to the referring MD via EMR.  Problems: Panulcerative colitis GERD Schatzki's ring  History of Present Illness: Herbert Morrison is a 75 y.o. male with past medical history of skin cancer, depression, ulcerative colitis, GERD, hypertension, lupus, rheumatoid arthritis, who presents for follow up of ulcerative colitis.  The patient was last seen on 01/10/2021. At that time, the patient he was scheduled for an EGD and a colonoscopy.  He was advised to continue omeprazole 20 mg twice a day for GERD. ESR was checked which was 9 and CRP came back elevated at 27.  The patient was started on Apriso 1.5 g daily by Dr. Laural Golden on 01/30/2021 given findings in endoscopy and histologically.  Patient believes he was prescribed Apriso but is not sure if he ever took it. He initially thought he was taking it but then he remembered he was not taking this medicine at all. Is currently on prednisone 25 mg qday  and MTX 2.5 weekly for lupus. He had a history of sulfa allergy and was not able to tolerate sulfasalazine in the past.  Patient reports he is having heartburn at least once a day. He states that he is taking omeprazole 20 mg every 12 hours. He usually waits 30-60 minutes to have something to eat. No dysphagia or odynophagia.  He is having 4-5 Bms with soft consistency. He used to have less than that more than 6 months ago. The patient denies having any nausea, vomiting, fever, chills, hematochezia, melena, hematemesis, abdominal distention, abdominal pain, jaundice, pruritus or weight loss.  Last Endoscopy: 01/30/2021 - Normal hypopharynx. - Normal esophagus. - Widely patent Schatzki ring. Dilated and  disrupted. - 7 cm hiatal hernia. - Normal stomach. - Normal duodenal bulb. - Non-bleeding duodenal diverticulum. - No specimens collected.  Last Colonoscopy: 01/30/2021 - Perianal skin tags found on perianal exam. - One 4 to 6 mm polyp in the proximal ascending colon. SSL. - One 6 mm polyp in the mid ascending colon, removed with a cold snare. Resected and retrieved - inflammatory polyp - One 7 mm polyp in the distal ascending colon, removed with a cold snare. Resected and retrieved. - benign mucosa - Congested mucosa in the ascending colon. Biopsied - active chronic colitis - Nodular mucosa at the hepatic flexure. Biopsied - mildly chronic colitis - Localized mild inflammation was found in the proximal transverse colon. Biopsied - chronic colitis - Diverticulosis in the sigmoid colon. - External hemorrhoids.  Past Medical History: Past Medical History:  Diagnosis Date   Anxiety    Arthritis    Cancer ([place])    skin cancer removed Left shoulder Basal cell   Depression    GERD (gastroesophageal reflux disease)    Heart murmur    as a child   History of hiatal hernia    Hypertension    Lupus ([place]) 12/31/2020   Pneumonia    Rheumatoid arthritis ([place]) 12/31/2020   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[facility])     Past Surgical History: Past Surgical History:  Procedure Laterality Date   BIOPSY  01/30/2021   Procedure: BIOPSY;  Surgeon: Rogene Houston, MD;  [facility];  Service: Endoscopy;;   COLONOSCOPY N/A 04/09/2016   Procedure: COLONOSCOPY;  Surgeon: Rogene Houston, MD;  [facility];  Service: Endoscopy;  Laterality: N/A;  1:45   COLONOSCOPY WITH PROPOFOL N/A 01/30/2021   Procedure: COLONOSCOPY WITH PROPOFOL;  Surgeon: Rogene Houston, MD;  [facility];  Service: Endoscopy;  Laterality: N/A;   ESOPHAGOGASTRODUODENOSCOPY (EGD) WITH PROPOFOL N/A 01/30/2021   Procedure: ESOPHAGOGASTRODUODENOSCOPY (EGD) WITH PROPOFOL;  Surgeon: Rogene Houston, MD;   [facility];  Service: Endoscopy;  Laterality: N/A;  2:00, pt knows to arrive at 11:00   [place] hip Dr Alvan Dame 03/24/17   left elbow     spur removed   left knee surgery     open surgery for a meniscus tear 1970's   POLYPECTOMY  04/09/2016   Procedure: POLYPECTOMY;  Surgeon: Rogene Houston, MD;  [facility];  Service: Endoscopy;;  cecal polyp   POLYPECTOMY  01/30/2021   Procedure: POLYPECTOMY;  Surgeon: Rogene Houston, MD;  [facility];  Service: Endoscopy;;   rt knee arthroscopy     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 No pertinent family history.  Social History: Social History   Tobacco Use  Smoking Status Former  Smokeless Tobacco Former   Types: Chew   Quit date: 01/22/1965  Tobacco Comments   quit 43 years ago   Social History   Substance and Sexual Activity  Alcohol Use Yes   Alcohol/week: 0.0 standard drinks   Comment: occasionally   Social History   Substance and Sexual Activity  Drug Use No    Allergies: Allergies  Allergen Reactions   Erythromycin Nausea Only   Sulfasalazine Nausea And Vomiting    Medications: Current Outpatient Medications  Medication Sig Dispense Refill   Alpha-D-Galactosidase (BEANO) TABS Take 1 tablet by mouth 2 (two) times daily with a meal.     amLODipine-valsartan (EXFORGE) 10-320 MG tablet Take 1 tablet by mouth daily.     ammonium lactate (AMLACTIN) 12 % cream Apply 1 application topically daily as needed for dry skin.     buPROPion (WELLBUTRIN XL) 300 MG 24 hr tablet Take 300 mg by mouth daily.      cetirizine (ZYRTEC) 10 MG tablet Take 10 mg by mouth daily.     Cyanocobalamin (B-12) 2500 MCG SUBL Place 2,500 tablets under the tongue daily.     cyclobenzaprine (FLEXERIL) 10 MG tablet Take 10 mg by mouth 3 (three) times daily as needed for muscle spasms.     docusate sodium (COLACE) 100 MG capsule Take 200 mg by mouth daily as needed.     escitalopram (LEXAPRO) 20 MG tablet Take 20 mg by mouth daily.      fluticasone (FLONASE) 50 MCG/ACT nasal spray Place 1 spray into both nostrils daily.     hydrochlorothiazide (HYDRODIURIL) 25 MG tablet Take 25 mg by mouth daily.     ibuprofen (ADVIL) 200 MG tablet Take 600 mg by mouth daily as needed for mild pain (Joint inflammation).     methotrexate (RHEUMATREX) 2.5 MG tablet Take 2.5 mg by mouth once a week. Takes 5 tablets once a week     metoprolol succinate (TOPROL-XL)  50 MG 24 hr tablet Take 50 mg by mouth daily. Take with or immediately following a meal.     omeprazole (PRILOSEC) 20 MG capsule Take 20 mg by mouth 2 (two) times daily before a meal.     predniSONE (DELTASONE) 5 MG tablet Take 5 mg by mouth daily.     zinc gluconate 50 MG tablet Take 50 mg by mouth daily.     No current facility-administered medications for this visit.    Review of Systems: GENERAL: negative for malaise, night sweats HEENT: No changes in hearing or vision, no nose bleeds or other nasal problems. NECK: Negative for lumps, goiter, pain and significant neck swelling RESPIRATORY: Negative for cough, wheezing CARDIOVASCULAR: Negative for chest pain, leg swelling, palpitations, orthopnea GI: SEE HPI MUSCULOSKELETAL: Negative for joint pain or swelling, back pain, and muscle pain. SKIN: Negative for lesions, rash PSYCH: Negative for sleep disturbance, mood disorder and recent psychosocial stressors. HEMATOLOGY Negative for prolonged bleeding, bruising easily, and swollen nodes. ENDOCRINE: Negative for cold or heat intolerance, polyuria, polydipsia and  goiter. NEURO: negative for tremor, gait imbalance, syncope and seizures. The remainder of the review of systems is noncontributory.   Physical Exam: BP 118/71 (BP Location: Left Arm, Patient Position: Sitting, Cuff Size: Large)   Pulse (!) 59   Temp (!) 97.2 F (36.2 C) (Oral)   Ht 5' 6"  (1.676 m)   Wt 220 lb 11.2 oz (100.1 kg)   BMI 35.62 kg/m  GENERAL: The patient is AO x3, in no acute distress. HEENT: Head is normocephalic and atraumatic. EOMI are intact. Mouth is well hydrated and without lesions. NECK: Supple. No masses LUNGS: Clear to auscultation. No presence of rhonchi/wheezing/rales. Adequate chest expansion HEART: RRR, normal s1 and s2. ABDOMEN: Soft, nontender, no guarding, no peritoneal signs, and nondistended. BS +. No masses. EXTREMITIES: Without any cyanosis, clubbing, rash, lesions or edema. NEUROLOGIC: AOx3, no focal motor deficit. SKIN: no jaundice, no rashes  Imaging/Labs: as above  I personally reviewed and interpreted the available labs, imaging and endoscopic files.  Impression and Plan: Herbert Morrison is a 75 y.o. male with past medical history of skin cancer, depression, ulcerative colitis, GERD, hypertension, lupus, rheumatoid arthritis, who presents for follow up of ulcerative colitis.  The patient had presence of active colitis during his most recent endoscopic investigation.  He was advised to start taking Apriso but he never started this medication for unclear reasons (unknown if his pharmacy never filled the medication).  He has remained with presence of diarrhea which is consistent with his active colitis.  At this point, we will start him on Lialda 4.8 g every day and we will check surveillance labs.  I explained to the patient that the other is mesalamine based medication that does not have any sulfa component, he agreed to start this medication.  Given the fact that he had polyps and active inflammation, will recommend repeating a colonoscopy in 1 to  2 years depending on symptom improvement.  In terms of his GERD, he is having persistent symptoms despite taking omeprazole compliantly and in a proper fashion.  I advised him to increase his omeprazole to twice a day dosing.  The refractoriness is likely a result of his large hiatal hernia.  He will opt to pursue surgical intervention if he does not respond to medical treatment.  - Increase omeprazole to 40 mg twice a day - Start Lialda 4.8 g qday -check CBC, CMP, CRP and Vitamin D -Recommend repeat colonoscopy in  1-2 years - RTC 4 months  All questions were answered.      Harvel Quale, MD Gastroenterology and Hepatology [facility]

## 2021-04-16 LAB — CBC WITH DIFFERENTIAL/PLATELET
Absolute Monocytes: 618 cells/uL (ref 200–950)
Basophils Absolute: 62 cells/uL (ref 0–200)
Basophils Relative: 0.6 %
Eosinophils Absolute: 52 cells/uL (ref 15–500)
Eosinophils Relative: 0.5 %
HCT: 43 % (ref 38.5–50.0)
Hemoglobin: 14.5 g/dL (ref 13.2–17.1)
Lymphs Abs: 1071 cells/uL (ref 850–3900)
MCH: 28.8 pg (ref 27.0–33.0)
MCHC: 33.7 g/dL (ref 32.0–36.0)
MCV: 85.5 fL (ref 80.0–100.0)
MPV: 9.9 fL (ref 7.5–12.5)
Monocytes Relative: 6 %
Neutro Abs: 8498 cells/uL — ABNORMAL HIGH (ref 1500–7800)
Neutrophils Relative %: 82.5 %
Platelets: 361 10*3/uL (ref 140–400)
RBC: 5.03 10*6/uL (ref 4.20–5.80)
RDW: 14.4 % (ref 11.0–15.0)
Total Lymphocyte: 10.4 %
WBC: 10.3 10*3/uL (ref 3.8–10.8)

## 2021-04-16 LAB — COMPREHENSIVE METABOLIC PANEL
AG Ratio: 1.4 (calc) (ref 1.0–2.5)
ALT: 13 U/L (ref 9–46)
AST: 14 U/L (ref 10–35)
Albumin: 4.2 g/dL (ref 3.6–5.1)
Alkaline phosphatase (APISO): 51 U/L (ref 35–144)
BUN/Creatinine Ratio: 15 (calc) (ref 6–22)
BUN: 23 mg/dL (ref 7–25)
CO2: 23 mmol/L (ref 20–32)
Calcium: 9.8 mg/dL (ref 8.6–10.3)
Chloride: 102 mmol/L (ref 98–110)
Creat: 1.5 mg/dL — ABNORMAL HIGH (ref 0.70–1.28)
Globulin: 2.9 g/dL (calc) (ref 1.9–3.7)
Glucose, Bld: 140 mg/dL — ABNORMAL HIGH (ref 65–139)
Potassium: 4.1 mmol/L (ref 3.5–5.3)
Sodium: 138 mmol/L (ref 135–146)
Total Bilirubin: 0.3 mg/dL (ref 0.2–1.2)
Total Protein: 7.1 g/dL (ref 6.1–8.1)

## 2021-04-16 LAB — C-REACTIVE PROTEIN: CRP: 9 mg/L — ABNORMAL HIGH (ref <8.0)

## 2021-04-16 LAB — VITAMIN D 25 HYDROXY (VIT D DEFICIENCY, FRACTURES): Vit D, 25-Hydroxy: 70 ng/mL (ref 30–100)

## 2021-04-18 NOTE — Progress Notes (Signed)
I signed the paperwork

## 2021-05-12 HISTORY — PX: CATARACT EXTRACTION: SUR2

## 2021-05-22 ENCOUNTER — Other Ambulatory Visit (INDEPENDENT_AMBULATORY_CARE_PROVIDER_SITE_OTHER): Payer: Self-pay | Admitting: *Deleted

## 2021-05-22 ENCOUNTER — Encounter (INDEPENDENT_AMBULATORY_CARE_PROVIDER_SITE_OTHER): Payer: Self-pay | Admitting: *Deleted

## 2021-05-22 DIAGNOSIS — K51019 Ulcerative (chronic) pancolitis with unspecified complications: Secondary | ICD-10-CM

## 2021-07-03 ENCOUNTER — Telehealth (INDEPENDENT_AMBULATORY_CARE_PROVIDER_SITE_OTHER): Payer: Self-pay | Admitting: *Deleted

## 2021-07-03 NOTE — Telephone Encounter (Signed)
05/22/21. Called patient and he states he will do stool test - calprotein and would like it at [place]. Order put in

## 2021-07-09 ENCOUNTER — Emergency Department (HOSPITAL_COMMUNITY)
Admission: EM | Admit: 2021-07-09 | Discharge: 2021-07-10 | Disposition: A | Discharge status: Home / Self Care | Payer: Medicare Other | Attending: Emergency Medicine | Admitting: Emergency Medicine

## 2021-07-09 ENCOUNTER — Other Ambulatory Visit: Payer: Self-pay

## 2021-07-09 ENCOUNTER — Emergency Department (HOSPITAL_COMMUNITY): Payer: Medicare Other

## 2021-07-09 ENCOUNTER — Encounter (HOSPITAL_COMMUNITY): Payer: Self-pay

## 2021-07-09 DIAGNOSIS — R1010 Upper abdominal pain, unspecified: Secondary | ICD-10-CM | POA: Diagnosis not present

## 2021-07-09 DIAGNOSIS — I10 Essential (primary) hypertension: Secondary | ICD-10-CM | POA: Insufficient documentation

## 2021-07-09 DIAGNOSIS — R519 Headache, unspecified: Secondary | ICD-10-CM | POA: Diagnosis not present

## 2021-07-09 DIAGNOSIS — Z79899 Other long term (current) drug therapy: Secondary | ICD-10-CM | POA: Diagnosis not present

## 2021-07-09 LAB — COMPREHENSIVE METABOLIC PANEL
ALT: 19 U/L (ref 0–44)
AST: 14 U/L — ABNORMAL LOW (ref 15–41)
Albumin: 4 g/dL (ref 3.5–5.0)
Alkaline Phosphatase: 44 U/L (ref 38–126)
Anion gap: 9 (ref 5–15)
BUN: 23 mg/dL (ref 8–23)
CO2: 27 mmol/L (ref 22–32)
Calcium: 10 mg/dL (ref 8.9–10.3)
Chloride: 104 mmol/L (ref 98–111)
Creatinine, Ser: 1.4 mg/dL — ABNORMAL HIGH (ref 0.61–1.24)
GFR, Estimated: 53 mL/min — ABNORMAL LOW (ref >60)
Glucose, Bld: 107 mg/dL — ABNORMAL HIGH (ref 70–99)
Potassium: 4.7 mmol/L (ref 3.5–5.1)
Sodium: 140 mmol/L (ref 135–145)
Total Bilirubin: 0.4 mg/dL (ref 0.3–1.2)
Total Protein: 7.9 g/dL (ref 6.5–8.1)

## 2021-07-09 LAB — CBC WITH DIFFERENTIAL/PLATELET
Abs Immature Granulocytes: 0.04 10*3/uL (ref 0.00–0.07)
Basophils Absolute: 0.1 10*3/uL (ref 0.0–0.1)
Basophils Relative: 1 %
Eosinophils Absolute: 0.1 10*3/uL (ref 0.0–0.5)
Eosinophils Relative: 1 %
HCT: 45.6 % (ref 39.0–52.0)
Hemoglobin: 14.5 g/dL (ref 13.0–17.0)
Immature Granulocytes: 1 %
Lymphocytes Relative: 13 %
Lymphs Abs: 1.1 10*3/uL (ref 0.7–4.0)
MCH: 28.2 pg (ref 26.0–34.0)
MCHC: 31.8 g/dL (ref 30.0–36.0)
MCV: 88.5 fL (ref 80.0–100.0)
Monocytes Absolute: 0.6 10*3/uL (ref 0.1–1.0)
Monocytes Relative: 7 %
Neutro Abs: 6.6 10*3/uL (ref 1.7–7.7)
Neutrophils Relative %: 77 %
Platelets: 312 10*3/uL (ref 150–400)
RBC: 5.15 MIL/uL (ref 4.22–5.81)
RDW: 15.6 % — ABNORMAL HIGH (ref 11.5–15.5)
WBC: 8.5 10*3/uL (ref 4.0–10.5)
nRBC: 0 % (ref 0.0–0.2)

## 2021-07-09 NOTE — ED Triage Notes (Signed)
Sent from [facility] for hypertension and "head fullness". A/O x4, no neuro deficits.

## 2021-07-10 DIAGNOSIS — I10 Essential (primary) hypertension: Secondary | ICD-10-CM | POA: Diagnosis not present

## 2021-07-10 LAB — LIPASE, BLOOD: Lipase: 43 U/L (ref 11–51)

## 2021-07-10 LAB — MAGNESIUM: Magnesium: 2.6 mg/dL — ABNORMAL HIGH (ref 1.7–2.4)

## 2021-07-10 MED ORDER — ACETAMINOPHEN 500 MG PO TABS
1000.0000 mg | ORAL_TABLET | Freq: Once | ORAL | Status: AC
  Administered 2021-07-10: 1000 mg via ORAL
  Filled 2021-07-10: qty 2

## 2021-07-10 MED ORDER — AMLODIPINE BESYLATE 5 MG PO TABS
5.0000 mg | ORAL_TABLET | Freq: Once | ORAL | Status: AC
  Administered 2021-07-10: 5 mg via ORAL
  Filled 2021-07-10: qty 1

## 2021-07-10 NOTE — ED Provider Notes (Signed)
[facility] EMERGENCY DEPARTMENT Provider Note   CSN: [id number] Arrival date & time: 07/09/21  3709     History  Chief Complaint  Patient presents with   Hypertension    Herbert Morrison is a 76 y.o. male.  75 year old male with a past medical history of hypertension, lupus, rheumatoid arthritis, ulcerative colitis who presents emerged part today secondary to headache and high blood pressure.  Patient states he thought he had a sinus infection as he felt some pressure in the front of his head.  For like his eyes were bulging of his head.  He went to urgent care where they said his blood pressure was high he had to go to the emergency room immediately.  Patient presents here for the same.  Has some generalized weakness but has been on and off for the last year she has been diagnosed with RA and lupus.  States that he has not a fever or cough.  He states that when he wakes up in the morning he is a little too clear a lot of mucus out but that is not new over the last few weeks.  He had no significant increase in his drainage or coughing or fevers during that time.  No vision changes.  No shortness of breath, chest pain, lower extremity swelling, abdominal pain or pain elsewhere.  Patient states he does have cramps mostly at night, worse in his legs   Hypertension      Home Medications Prior to Admission medications   Medication Sig Start Date End Date Taking? Authorizing Provider  Alpha-D-Galactosidase (BEANO) TABS Take 1 tablet by mouth 2 (two) times daily with a meal.    [provider]  amLODipine-valsartan (EXFORGE) 10-320 MG tablet Take 1 tablet by mouth daily.    [provider]  ammonium lactate (AMLACTIN) 12 % cream Apply 1 application topically daily as needed for dry skin. 08/21/20   [provider]  buPROPion (WELLBUTRIN XL) 300 MG 24 hr tablet Take 300 mg by mouth daily.    [provider]  cetirizine (ZYRTEC) 10 MG tablet Take 10 mg by  mouth daily.    [provider]  Cyanocobalamin (B-12) 2500 MCG SUBL Place 2,500 tablets under the tongue daily.    [provider]  cyclobenzaprine (FLEXERIL) 10 MG tablet Take 10 mg by mouth 3 (three) times daily as needed for muscle spasms. 08/24/20   [provider]  docusate sodium (COLACE) 100 MG capsule Take 200 mg by mouth daily as needed.    [provider]  escitalopram (LEXAPRO) 20 MG tablet Take 20 mg by mouth daily.     [provider]  fluticasone (FLONASE) 50 MCG/ACT nasal spray Place 1 spray into both nostrils daily.    [provider]  hydrochlorothiazide (HYDRODIURIL) 25 MG tablet Take 25 mg by mouth daily.    [provider]  ibuprofen (ADVIL) 200 MG tablet Take 600 mg by mouth daily as needed for mild pain (Joint inflammation).    [provider]  mesalamine (LIALDA) 1.2 g EC tablet Take 4 tablets (4.8 g total) by mouth daily with breakfast. 04/15/21   Montez Morita, Quillian Quince, MD  methotrexate (RHEUMATREX) 2.5 MG tablet Take 2.5 mg by mouth once a week. Takes 5 tablets once a week    [provider]  metoprolol succinate (TOPROL-XL) 50 MG 24 hr tablet Take 50 mg by mouth daily. Take with or immediately following a meal.    [provider]  omeprazole (PRILOSEC) 20 MG capsule Take 20 mg by mouth 2 (two) times daily before a meal.    [provider]  predniSONE (DELTASONE) 5 MG tablet Take 5 mg by mouth daily. 01/01/21   [provider]  zinc gluconate 50 MG tablet Take 50 mg by mouth daily.    [provider]      Allergies    Erythromycin and Sulfasalazine    Review of Systems   Review of Systems  Physical Exam Updated Vital Signs BP (!) 172/91    Pulse (!) 47    Temp 97.6 F (36.4 C) (Oral)    Resp 15    Ht 5' 6"  (1.676 m)    Wt 99.8 kg    SpO2 93%    BMI 35.51 kg/m  Physical Exam Vitals and nursing note reviewed.  Constitutional:      Appearance: He  is well-developed.  HENT:     Head: Normocephalic and atraumatic.     Mouth/Throat:     Mouth: Mucous membranes are moist.     Pharynx: Oropharynx is clear.  Eyes:     Pupils: Pupils are equal, round, and reactive to light.  Cardiovascular:     Rate and Rhythm: Normal rate and regular rhythm.     Comments: Radial and DP pulses intact and symmetric Pulmonary:     Effort: Pulmonary effort is normal. No respiratory distress.  Abdominal:     General: Abdomen is flat. There is no distension.     Comments: Cramping in upper abdomen when he sits up  Musculoskeletal:        General: Normal range of motion.     Cervical back: Normal range of motion.  Skin:    General: Skin is warm and dry.  Neurological:     General: No focal deficit present.     Mental Status: He is alert and oriented to person, place, and time.     Cranial Nerves: No cranial nerve deficit.     Sensory: No sensory deficit.     Motor: No weakness.     Coordination: Coordination normal.     Gait: Gait normal.     Deep Tendon Reflexes: Reflexes normal.  Psychiatric:        Mood and Affect: Mood normal.    ED Results / Procedures / Treatments   Labs (all labs ordered are listed, but only abnormal results are displayed) Labs Reviewed  CBC WITH DIFFERENTIAL/PLATELET - Abnormal; Notable for the following components:      Result Value   RDW 15.6 (*)    All other components within normal limits  COMPREHENSIVE METABOLIC PANEL - Abnormal; Notable for the following components:   Glucose, Bld 107 (*)    Creatinine, Ser 1.40 (*)    AST 14 (*)    GFR, Estimated 53 (*)    All other components within normal limits  MAGNESIUM - Abnormal; Notable for the following components:   Magnesium 2.6 (*)    All other components within normal limits  LIPASE, BLOOD    EKG None  Radiology CT Head Wo Contrast  Result Date: 07/09/2021 CLINICAL DATA:  Headache.  Hypertension. EXAM: CT HEAD WITHOUT CONTRAST TECHNIQUE: Contiguous  axial images were obtained from the base of the skull through the vertex without intravenous contrast. RADIATION DOSE REDUCTION: This exam was performed according to the departmental dose-optimization program which includes automated exposure control, adjustment of the mA and/or kV according to patient size and/or use of iterative reconstruction  technique. COMPARISON:  None. FINDINGS: Brain: The ventricles are normal in size and configuration. The basilar cisterns are patent. No mass, mass effect, or midline shift. No acute intracranial hemorrhage is seen. No abnormal extra-axial fluid collection. Mild periventricular white matter hypodensities nonspecific but most likely secondary to chronic ischemic white matter changes. Preservation of the normal cortical gray-white interface without CT evidence of an acute major vascular territorial cortical based infarction. Vascular: No hyperdense vessel or unexpected calcification. Skull: Normal. Negative for fracture or focal lesion. Sinuses/Orbits: The visualized orbits are unremarkable. The visualized paranasal sinuses and mastoid air cells are clear. Other: None. IMPRESSION: No acute intracranial process. Electronically Signed   By: Yvonne Kendall M.D.   On: 07/09/2021 20:43    Procedures Procedures    Medications Ordered in ED Medications  amLODipine (NORVASC) tablet 5 mg (5 mg Oral Given 07/10/21 0054)  acetaminophen (TYLENOL) tablet 1,000 mg (1,000 mg Oral Given 07/10/21 0054)    ED Course/ Medical Decision Making/ A&P                           Medical Decision Making Amount and/or Complexity of Data Reviewed Labs: ordered.  Risk OTC drugs. Prescription drug management.  Patietn with likely hypertensive headache. No e/o bleed/ischemia on CT scan. Low concern for stroke without neuro deficits elsewhere. No other e/o end organ damage. Will give tylenol and small dose of home med.  Headaches improved with BP med and tylenol. Neuro exam persistently  intact. Stable for d/c w/ PCP follow up for BP management.   Final Clinical Impression(s) / ED Diagnoses Final diagnoses:  Hypertension, unspecified type  Nonintractable headache, unspecified chronicity pattern, unspecified headache type    Rx / DC Orders ED Discharge Orders     None         Geo Slone, Corene Cornea, MD 07/10/21 [phone]

## 2021-08-15 ENCOUNTER — Ambulatory Visit (INDEPENDENT_AMBULATORY_CARE_PROVIDER_SITE_OTHER): Payer: Medicare Other | Admitting: Gastroenterology

## 2021-09-05 DIAGNOSIS — E782 Mixed hyperlipidemia: Secondary | ICD-10-CM | POA: Diagnosis present

## 2021-09-05 DIAGNOSIS — I2510 Atherosclerotic heart disease of native coronary artery without angina pectoris: Secondary | ICD-10-CM | POA: Diagnosis present

## 2021-09-30 ENCOUNTER — Ambulatory Visit (INDEPENDENT_AMBULATORY_CARE_PROVIDER_SITE_OTHER): Payer: Medicare Other | Admitting: Gastroenterology

## 2021-09-30 ENCOUNTER — Encounter (INDEPENDENT_AMBULATORY_CARE_PROVIDER_SITE_OTHER): Payer: Self-pay | Admitting: Gastroenterology

## 2021-09-30 VITALS — BP 165/76 | HR 54 | Temp 97.6°F | Ht 66.0 in | Wt 224.1 lb

## 2021-09-30 DIAGNOSIS — K51019 Ulcerative (chronic) pancolitis with unspecified complications: Secondary | ICD-10-CM

## 2021-09-30 DIAGNOSIS — Z136 Encounter for screening for cardiovascular disorders: Secondary | ICD-10-CM | POA: Diagnosis not present

## 2021-09-30 DIAGNOSIS — K219 Gastro-esophageal reflux disease without esophagitis: Secondary | ICD-10-CM

## 2021-09-30 NOTE — Progress Notes (Signed)
[name], M.D. Gastroenterology & Hepatology [facility] [address] [place], [place] [address]  Primary Care Physician: Pcp, No No address on file  I will communicate my assessment and recommendations to the referring MD via EMR.  Problems: Panulcerative colitis GERD Schatzki's ring  History of Present Illness: Herbert Morrison is a 76 y.o. male with past medical history of skin cancer, depression, ulcerative colitis, GERD, hypertension, lupus, rheumatoid arthritis, who presents for follow up of ulcerative colitis.  The patient was last seen on 04/15/2021. At that time, the patient was started on Lialda 4.8 g every day and had omeprazole increased to 40 mg twice a day.  Patient underwent blood work-up which included CBC that was within normal limits, CMP showed a creatinine 1.5, vitamin D levels were normal, CRP was elevated at 9.0.  He had fecal calprotectin ordered but he did not perform this test.  Patient reports that he feels well from the gastrointestinal perspective. Patient states that he recently had Strep throat infection, and since then he has presenting recurrent cough and soreness in his throat with some hoarseness. Denies heartburn or dysphagia. The patient denies having any nausea, vomiting, fever, chills, hematochezia, melena, hematemesis, abdominal distention, abdominal pain, diarrhea, jaundice, pruritus. Has been gaining some weight recently.  States he had diarrhea after taking the mesalamine, so he has not taken the medicine. Has 1-2 formed Bms per day without diarrhea consistency. Does not take any antidiarrheals. Currently on prednisone 10 mg qday for management of RA.  Patient states he tried Remicade once in the past but had an anaphylactic reaction. Has not tried other biologicals.  He is taking omeprazole twice a day, but states a few times he had only taken it once and he did not have not have any  heartburn or dysphagia.  Most recent blood work-up from 07/09/2021 showed normal liver function tests, magnesium 2.6, normal electrolytes, creatinine 1.4, CBC within normal limits.  Last Endoscopy: 01/30/2021 - Normal hypopharynx. - Normal esophagus. - Widely patent Schatzki ring. Dilated and disrupted. - 7 cm hiatal hernia. - Normal stomach. - Normal duodenal bulb. - Non-bleeding duodenal diverticulum. - No specimens collected.   Last Colonoscopy: 01/30/2021 - Perianal skin tags found on perianal exam. - One 4 to 6 mm polyp in the proximal ascending colon. SSL. - One 6 mm polyp in the mid ascending colon, removed with a cold snare. Resected and retrieved - inflammatory polyp - One 7 mm polyp in the distal ascending colon, removed with a cold snare. Resected and retrieved. - benign mucosa - Congested mucosa in the ascending colon. Biopsied - active chronic colitis - Nodular mucosa at the hepatic flexure. Biopsied - mildly chronic colitis - Localized mild inflammation was found in the proximal transverse colon. Biopsied - chronic colitis - Diverticulosis in the sigmoid colon. - External hemorrhoids.  Past Medical History: Past Medical History:  Diagnosis Date   Anxiety    Arthritis    Cancer ([place])    skin cancer removed Left shoulder Basal cell   Depression    GERD (gastroesophageal reflux disease)    Heart murmur    as a child   History of hiatal hernia    Hypertension    Lupus ([place]) 12/31/2020   Pneumonia    Rheumatoid arthritis ([place]) 12/31/2020   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[facility])     Past Surgical History: Past Surgical History:  Procedure Laterality Date   BIOPSY  01/30/2021   Procedure: BIOPSY;  Surgeon: Rogene Houston, MD;  [facility];  Service: Endoscopy;;   COLONOSCOPY N/A 04/09/2016   Procedure: COLONOSCOPY;  Surgeon: Rogene Houston, MD;  [facility];  Service: Endoscopy;  Laterality: N/A;  1:45   COLONOSCOPY WITH PROPOFOL N/A 01/30/2021    Procedure: COLONOSCOPY WITH PROPOFOL;  Surgeon: Rogene Houston, MD;  [facility];  Service: Endoscopy;  Laterality: N/A;   ESOPHAGOGASTRODUODENOSCOPY (EGD) WITH PROPOFOL N/A 01/30/2021   Procedure: ESOPHAGOGASTRODUODENOSCOPY (EGD) WITH PROPOFOL;  Surgeon: Rogene Houston, MD;  [facility];  Service: Endoscopy;  Laterality: N/A;  2:00, pt knows to arrive at 11:00   [place] hip Dr Alvan Dame 03/24/17   left elbow     spur removed   left knee surgery     open surgery for a meniscus tear 1970's   POLYPECTOMY  04/09/2016   Procedure: POLYPECTOMY;  Surgeon: Rogene Houston, MD;  [facility];  Service: Endoscopy;;  cecal polyp   POLYPECTOMY  01/30/2021   Procedure: POLYPECTOMY;  Surgeon: Rogene Houston, MD;  [facility];  Service: Endoscopy;;   rt knee arthroscopy     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 No pertinent family history.  Social History: Social History   Tobacco Use  Smoking Status Former  Smokeless Tobacco Former   Types: Chew   Quit date: 01/22/1965  Tobacco Comments   quit 43 years ago   Social History   Substance and Sexual Activity  Alcohol Use Yes   Alcohol/week: 0.0 standard drinks   Comment: occasionally   Social History   Substance and Sexual Activity  Drug Use No    Allergies: Allergies  Allergen Reactions   Erythromycin Nausea Only   Sulfasalazine Nausea And Vomiting    Medications: Current Outpatient Medications  Medication Sig Dispense Refill   amLODipine-valsartan (EXFORGE) 10-320 MG tablet Take 1 tablet by mouth  daily.     ammonium lactate (AMLACTIN) 12 % cream Apply 1 application topically daily as needed for dry skin.     buPROPion (WELLBUTRIN XL) 300 MG 24 hr tablet Take 300 mg by mouth daily.     Cyanocobalamin (B-12) 2500 MCG SUBL Place 2,500 tablets under the tongue daily.     cyclobenzaprine (FLEXERIL) 10 MG tablet Take 10 mg by mouth 3 (three) times daily as needed for muscle spasms.     docusate sodium (COLACE) 100 MG capsule Take 200 mg by mouth daily as needed.     escitalopram (LEXAPRO) 20 MG tablet Take 20 mg by mouth daily.      fluticasone (FLONASE) 50 MCG/ACT nasal spray Place 1 spray into both nostrils daily.     guaiFENesin (MUCINEX) 600 MG 12 hr tablet Take by mouth daily at 6 (six) AM.     hydrALAZINE (APRESOLINE) 50 MG tablet Take 50 mg by mouth daily at 6 (six) AM.     hydrochlorothiazide (HYDRODIURIL) 25 MG tablet Take 25 mg by mouth daily.     ibuprofen (ADVIL) 200 MG tablet Take 600 mg by mouth daily as needed for mild  pain (Joint inflammation).     methotrexate (RHEUMATREX) 2.5 MG tablet Take 2.5 mg by mouth once a week. Takes 5 tablets once a week     metoprolol succinate (TOPROL-XL) 50 MG 24 hr tablet Take 50 mg by mouth daily. Take with or immediately following a meal.     omeprazole (PRILOSEC) 20 MG capsule Take 20 mg by mouth 2 (two) times daily before a meal.     OVER THE COUNTER MEDICATION daily at 6 (six) AM. Aller-zyr once per day.     predniSONE (DELTASONE) 5 MG tablet Take 10 mg by mouth daily.     zinc gluconate 50 MG tablet Take 50 mg by mouth daily.     mesalamine (LIALDA) 1.2 g EC tablet Take 4 tablets (4.8 g total) by mouth daily with breakfast. (Patient not taking: Reported on 09/30/2021) 120 tablet 3   No current facility-administered medications for this visit.    Review of Systems: GENERAL: negative for malaise, night sweats HEENT: No changes in hearing or vision, no nose bleeds or other nasal problems. NECK: Negative for lumps, goiter, pain and  significant neck swelling RESPIRATORY: Negative for cough, wheezing CARDIOVASCULAR: Negative for chest pain, leg swelling, palpitations, orthopnea GI: SEE HPI MUSCULOSKELETAL: Negative for joint pain or swelling, back pain, and muscle pain. SKIN: Negative for lesions, rash PSYCH: Negative for sleep disturbance, mood disorder and recent psychosocial stressors. HEMATOLOGY Negative for prolonged bleeding, bruising easily, and swollen nodes. ENDOCRINE: Negative for cold or heat intolerance, polyuria, polydipsia and goiter. NEURO: negative for tremor, gait imbalance, syncope and seizures. The remainder of the review of systems is noncontributory.   Physical Exam: BP (!) 165/76 (BP Location: Left Arm, Patient Position: Sitting, Cuff Size: Large)   Pulse (!) 54   Temp 97.6 F (36.4 C) (Oral)   Ht 5' 6"  (1.676 m)   Wt 224 lb 1.6 oz (101.7 kg)   BMI 36.17 kg/m  GENERAL: The patient is AO x3, in no acute distress. HEENT: Head is normocephalic and atraumatic. EOMI are intact. Mouth is well hydrated and without lesions. NECK: Supple. No masses LUNGS: Clear to auscultation. No presence of rhonchi/wheezing/rales. Adequate chest expansion HEART: RRR, normal s1 and s2. ABDOMEN: Soft, nontender, no guarding, no peritoneal signs, and nondistended. BS +. No masses. EXTREMITIES: Without any cyanosis, clubbing, rash, lesions or edema. NEUROLOGIC: AOx3, no focal motor deficit. SKIN: no jaundice, no rashes  Imaging/Labs: as above  I personally reviewed and interpreted the available labs, imaging and endoscopic files.  Impression and Plan: Herbert Morrison is a 76 y.o. male with past medical history of skin cancer, depression, ulcerative colitis, GERD, hypertension, lupus, rheumatoid arthritis, who presents for follow up of ulcerative colitis.  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  He had an anaphylactic reaction to Remicade in the past.  We had a lengthy discussion regarding the importance of achieving endoscopic and clinical remission.  It is likely that he is currently on clinical remission as he is currently on prednisone for his rheumatoid arthritis.  However, this will be a transient management by rheumatology and is not an adequate long term treatment for his disease. He will require treatment with steroid-sparing medications. Discussion was held regarding benefits and side effects of immunomodulators/biologicals (including infections, malignancies such as lymphoma, skin cancer, tolerance to medication), as well as need to control her disease clinically and endoscopically (  ideally microscopically as well) to avoid recurrence of her symptomatology. He is interested in starting Rinvoq. We will obtain Quantiferon, hepatitis B serology and lipid panel today prior to starting prior authorization for the medicine. I also discussed the need to obtain shingles vaccination he will request this to his PCP.  Finally, his GERD has been well controlled on BID PPI. We willa ttempt decreasing the dosage to once a day dosing with rescue doses of Pepcid as needed.  - Start Rinvoq induction 45 mg daily and continue 30 mg qday -  Check Quantiferon, hepatitis B serology and lipid panel  -Patient to ask PCP for Shigrix (shingles vaccine) - Decrease omeprazole to once a day dosing. If breakthrough episodes, take Pepcid as needed at night.  All questions were answered.      Harvel Quale, MD Gastroenterology and Hepatology [facility]

## 2021-09-30 NOTE — Patient Instructions (Addendum)
Start Rinvoq induction 45 mg daily and continue 30 mg qday Perform blood workup Please ask PCP for Shigrix (shingles vaccine) Decrease omeprazole to once a day dosing. If breakthrough episodes, take Pepcid as needed at night.

## 2021-10-04 LAB — QUANTIFERON-TB GOLD PLUS
Mitogen-NIL: 0.95 IU/mL
NIL: 0.03 IU/mL
QuantiFERON-TB Gold Plus: NEGATIVE
TB1-NIL: 0.01 IU/mL
TB2-NIL: 0 IU/mL

## 2021-10-04 LAB — LIPID PANEL
Cholesterol: 181 mg/dL (ref <200)
HDL: 64 mg/dL (ref >=40)
LDL Cholesterol (Calc): 99 mg/dL (calc)
Non-HDL Cholesterol (Calc): 117 mg/dL (calc) (ref <130)
Total CHOL/HDL Ratio: 2.8 (calc) (ref <5.0)
Triglycerides: 86 mg/dL (ref <150)

## 2021-10-04 LAB — HEPATITIS B SURFACE ANTIGEN: Hepatitis B Surface Ag: NONREACTIVE

## 2021-10-08 ENCOUNTER — Other Ambulatory Visit (INDEPENDENT_AMBULATORY_CARE_PROVIDER_SITE_OTHER): Payer: Self-pay

## 2021-10-08 DIAGNOSIS — K51019 Ulcerative (chronic) pancolitis with unspecified complications: Secondary | ICD-10-CM

## 2021-10-08 MED ORDER — RINVOQ 45 MG PO TB24: 45.0000 mg | ORAL_TABLET | Freq: Every day | ORAL | 0 refills | Status: AC

## 2021-10-08 NOTE — Progress Notes (Signed)
I have submitted the rx to [facility] electronically, awaiting records to fax to [facility].

## 2021-11-05 ENCOUNTER — Encounter: Payer: Self-pay | Admitting: Pulmonary Disease

## 2021-11-05 ENCOUNTER — Ambulatory Visit (INDEPENDENT_AMBULATORY_CARE_PROVIDER_SITE_OTHER): Payer: Medicare Other | Admitting: Pulmonary Disease

## 2021-11-05 VITALS — BP 122/76 | HR 55 | Temp 98.0°F | Ht 66.0 in | Wt 225.0 lb

## 2021-11-05 DIAGNOSIS — R0609 Other forms of dyspnea: Secondary | ICD-10-CM | POA: Diagnosis not present

## 2021-11-05 MED ORDER — PREDNISONE 20 MG PO TABS: 20.0000 mg | ORAL_TABLET | Freq: Every day | ORAL | 0 refills | Status: DC

## 2021-11-22 ENCOUNTER — Telehealth: Payer: Self-pay | Admitting: Pulmonary Disease

## 2021-11-22 NOTE — Telephone Encounter (Signed)
Called patient and he states that he would like to decrease the dosage of his prednisone. Patient states he is on 34m and it is not helping him.   Please advise sir

## 2021-11-27 NOTE — Telephone Encounter (Signed)
Called and left voicemail for patient that it is ok for him to decrease prednisone per Dr Jenetta Downer. And that if he has any questions to call office back. Nothing further needed

## 2021-11-27 NOTE — Telephone Encounter (Signed)
Okay to decrease dose of prednisone

## 2021-12-02 ENCOUNTER — Ambulatory Visit (INDEPENDENT_AMBULATORY_CARE_PROVIDER_SITE_OTHER): Payer: Medicare Other | Admitting: Pulmonary Disease

## 2021-12-02 DIAGNOSIS — R0609 Other forms of dyspnea: Secondary | ICD-10-CM | POA: Diagnosis not present

## 2021-12-02 LAB — PULMONARY FUNCTION TEST
DL/VA % pred: 114 %
DL/VA: 4.64 ml/min/mmHg/L
DLCO cor % pred: 112 %
DLCO cor: 24.33 ml/min/mmHg
DLCO unc % pred: 112 %
DLCO unc: 24.33 ml/min/mmHg
FEF 25-75 Post: 2.64 L/sec
FEF 25-75 Pre: 2.39 L/sec
FEF2575-%Change-Post: 10 %
FEF2575-%Pred-Post: 144 %
FEF2575-%Pred-Pre: 130 %
FEV1-%Change-Post: 4 %
FEV1-%Pred-Post: 97 %
FEV1-%Pred-Pre: 93 %
FEV1-Post: 2.43 L
FEV1-Pre: 2.34 L
FEV1FVC-%Change-Post: 2 %
FEV1FVC-%Pred-Pre: 112 %
FEV6-%Change-Post: 1 %
FEV6-%Pred-Post: 89 %
FEV6-%Pred-Pre: 88 %
FEV6-Post: 2.88 L
FEV6-Pre: 2.85 L
FEV6FVC-%Pred-Post: 107 %
FEV6FVC-%Pred-Pre: 107 %
FVC-%Change-Post: 1 %
FVC-%Pred-Post: 83 %
FVC-%Pred-Pre: 82 %
FVC-Post: 2.88 L
FVC-Pre: 2.85 L
Post FEV1/FVC ratio: 84 %
Post FEV6/FVC ratio: 100 %
Pre FEV1/FVC ratio: 82 %
Pre FEV6/FVC Ratio: 100 %
RV % pred: 169 %
RV: 3.82 L
TLC % pred: 114 %
TLC: 6.91 L

## 2021-12-02 NOTE — Progress Notes (Signed)
Full PFT Performed Today  

## 2021-12-02 NOTE — Patient Instructions (Signed)
Full PFT Performed Today  

## 2021-12-03 ENCOUNTER — Encounter: Payer: Self-pay | Admitting: Student

## 2021-12-03 ENCOUNTER — Ambulatory Visit (INDEPENDENT_AMBULATORY_CARE_PROVIDER_SITE_OTHER): Payer: Medicare Other | Admitting: Student

## 2021-12-03 VITALS — BP 136/82 | HR 60 | Temp 97.8°F | Ht 65.0 in | Wt 227.0 lb

## 2021-12-03 DIAGNOSIS — R600 Localized edema: Secondary | ICD-10-CM | POA: Diagnosis not present

## 2021-12-03 DIAGNOSIS — R0683 Snoring: Secondary | ICD-10-CM

## 2021-12-03 DIAGNOSIS — R0609 Other forms of dyspnea: Secondary | ICD-10-CM

## 2021-12-03 DIAGNOSIS — R053 Chronic cough: Secondary | ICD-10-CM | POA: Diagnosis not present

## 2021-12-03 MED ORDER — ANORO ELLIPTA 62.5-25 MCG/ACT IN AEPB: 1.0000 | INHALATION_SPRAY | Freq: Every day | RESPIRATORY_TRACT | 0 refills | Status: DC

## 2021-12-03 NOTE — Patient Instructions (Addendum)
-   we will schedule cardiopulmonary exercise test, sleep study, DVT ultrasound - referral placed to GI in [place] - try anoro 1 puff once daily - if helpful for you send message through my chart or call and we'll refill it or a similar medication if need a more affordable option - if not feeling better despite these measures and your lower extremity ultrasound is negative for blood clot it may be worth talking to your primary care or cardiologist about trial of lasix for possible diastolic heart failure - albuterol as needed - see you in 3 months to discuss

## 2021-12-03 NOTE — Progress Notes (Signed)
Synopsis: Referred for dyspnea by No ref. provider found  Subjective:   PATIENT ID: Herbert Morrison GENDER: male DOB: 10/03/1945, MRN: [id number]  Chief Complaint  Patient presents with   Follow-up    Review PFT done 12/02/21. Pt states his breathing is unchanged since the last visit. He gets winded walking approx 100 ft. He has non prod cough. He is using his albuterol inhaler 3-4 x per wk and this helps some.    46yM with history of GERD, hiatal hernia, SLE/RA, UC seen 11/05/21 by Dr. Ander Slade for chest heaviness/discomfort, DOE of unclear etiology. He was encouraged to increase dose of prednisone to 20 mg daily for 7d but failed to note any improvement with this change. Records requested. PFT yesterday shows a little bit of air trapping by reduced FVC/SVC but otherwise technically normal.   He thinks albuterol is maybe helpful.  DOE to 25-50 feet. Cough is dry but bothersome. He recently tried decreasing his omeprazole to 20 mg daily but self-increased to 20 mg twice daily due to more trouble with reflux.   He does snore but has had PSG that was normal 2-3 years ago. He thinks he has gained 25-30 lb since then. He does feel sleepy during the day.      Otherwise pertinent review of systems is negative.  He smoked for about 5-6 years 1 pack every 4 days typically in distant past.   Past Medical History:  Diagnosis Date   Anxiety    Arthritis    Cancer ([place])    skin cancer removed Left shoulder Basal cell   Depression    GERD (gastroesophageal reflux disease)    Heart murmur    as a child   History of hiatal hernia    Hypertension    Lupus ([place]) 12/31/2020   Pneumonia    Rheumatoid arthritis ([place]) 12/31/2020   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([place])      No family history on file.   Past Surgical History:  Procedure Laterality Date   BIOPSY  01/30/2021   Procedure: BIOPSY;  Surgeon: Rogene Houston, MD;  [facility];  Service: Endoscopy;;   COLONOSCOPY N/A  04/09/2016   Procedure: COLONOSCOPY;  Surgeon: Rogene Houston, MD;  [facility];  Service: Endoscopy;  Laterality: N/A;  1:45   COLONOSCOPY WITH PROPOFOL N/A 01/30/2021   Procedure: COLONOSCOPY WITH PROPOFOL;  Surgeon: Rogene Houston, MD;  [facility];  Service: Endoscopy;  Laterality: N/A;   ESOPHAGOGASTRODUODENOSCOPY (EGD) WITH PROPOFOL N/A 01/30/2021   Procedure: ESOPHAGOGASTRODUODENOSCOPY (EGD) WITH PROPOFOL;  Surgeon: Rogene Houston, MD;  [facility];  Service: Endoscopy;  Laterality: N/A;  2:00, pt knows to arrive at 11:00   [place] hip Dr Alvan Dame 03/24/17   left elbow     spur removed   left knee surgery     open surgery for a meniscus tear 1970's   POLYPECTOMY  04/09/2016   Procedure: POLYPECTOMY;  Surgeon: Rogene Houston, MD;  [facility];  Service: Endoscopy;;  cecal polyp   POLYPECTOMY  01/30/2021   Procedure: POLYPECTOMY;  Surgeon: Rogene Houston, MD;  [facility];  Service: Endoscopy;;   rt knee arthroscopy     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   Procedure: LEFT TOTAL HIP ARTHROPLASTY ANTERIOR APPROACH;  Surgeon: Paralee Cancel, MD;  [facility];  Service: Orthopedics;  Laterality: Left;  70 mins   TOTAL KNEE  ARTHROPLASTY Left 08/11/2019   Procedure: TOTAL KNEE ARTHROPLASTY;  Surgeon: Paralee Cancel, MD;  [facility];  Service: Orthopedics;  Laterality: Left;  70 mins    Social History   Socioeconomic History   Marital status: Married    Spouse name: Not on file   Number of children: Not on file   Years of education: Not on file   Highest education level: Not on file  Occupational History   Not on file  Tobacco Use   Smoking status: Former   Smokeless tobacco: Former    Types: Chew    Quit date: 01/22/1965   Tobacco comments:    quit 43 years ago  Vaping Use   Vaping Use: Never used  Substance and Sexual Activity   Alcohol use: Yes    Alcohol/week: 0.0 standard drinks of  alcohol    Comment: occasionally   Drug use: No   Sexual activity: Yes  Other Topics Concern   Not on file  Social History Narrative   Not on file   Social Determinants of Health   Financial Resource Strain: Not on file  Food Insecurity: Not on file  Transportation Needs: Not on file  Physical Activity: Not on file  Stress: Not on file  Social Connections: Not on file  Intimate Partner Violence: Not on file     Allergies  Allergen Reactions   Remicade [Infliximab] Anaphylaxis   Erythromycin Nausea Only   Grass Pollen(K-O-R-T-Swt Vern) Other (See Comments)    Seasonal allergies    Pollen Extract Other (See Comments)   Sulfasalazine Nausea And Vomiting     Outpatient Medications Prior to Visit  Medication Sig Dispense Refill   albuterol (VENTOLIN HFA) 108 (90 Base) MCG/ACT inhaler Inhale 2 puffs into the lungs every 4 (four) hours as needed.     amLODipine-valsartan (EXFORGE) 10-320 MG tablet Take 1 tablet by mouth daily.     ammonium lactate (AMLACTIN) 12 % cream Apply 1 application topically daily as needed for dry skin.     buPROPion (WELLBUTRIN XL) 300 MG 24 hr tablet Take 300 mg by mouth daily.     Cyanocobalamin (B-12) 2500 MCG SUBL Place 2,500 tablets under the tongue daily.     cyclobenzaprine (FLEXERIL) 10 MG tablet Take 10 mg by mouth 3 (three) times daily as needed for muscle spasms.     docusate sodium (COLACE) 100 MG capsule Take 200 mg by mouth daily as needed.     escitalopram (LEXAPRO) 20 MG tablet Take 20 mg by mouth daily.      fluticasone (FLONASE) 50 MCG/ACT nasal spray Place 1 spray into both nostrils daily.     hydrALAZINE (APRESOLINE) 50 MG tablet Take 50 mg by mouth daily at 6 (six) AM.     hydrochlorothiazide (HYDRODIURIL) 25 MG tablet Take 25 mg by mouth daily.     ibuprofen (ADVIL) 200 MG tablet Take 600 mg by mouth daily as needed for mild pain (Joint inflammation).     metoprolol succinate (TOPROL-XL) 50 MG 24 hr tablet Take 50 mg by mouth  daily. Take with or immediately following a meal.     omeprazole (PRILOSEC) 20 MG capsule Take 20 mg by mouth 2 (two) times daily before a meal.     OVER THE COUNTER MEDICATION daily at 6 (six) AM. Aller-zyr once per day.     predniSONE (DELTASONE) 5 MG tablet Take 10 mg by mouth daily.     Upadacitinib ER (RINVOQ) 45 MG TB24 Take 45 mg  by mouth daily at 6 (six) AM. 56 tablet 0   zinc gluconate 50 MG tablet Take 50 mg by mouth daily.     benzonatate (TESSALON) 200 MG capsule Take by mouth.     predniSONE (DELTASONE) 20 MG tablet Take 1 tablet (20 mg total) by mouth daily with breakfast. 14 tablet 0   No facility-administered medications prior to visit.       Objective:   Physical Exam:  General appearance: 76 y.o., male, NAD, conversant  Eyes: anicteric sclerae; PERRL, tracking appropriately HENT: NCAT; MMM Neck: Trachea midline; no lymphadenopathy, no JVD Lungs: CTAB, no crackles, no wheeze, with normal respiratory effort CV: RRR, no murmur  Abdomen: Soft, non-tender; non-distended, BS present  Extremities: 2+ BLE pitting edema, warm Skin: Normal turgor and texture; no rash Psych: Appropriate affect Neuro: Alert and oriented to person and place, no focal deficit     Vitals:   12/03/21 1138  BP: 136/82  Pulse: 60  Temp: 97.8 F (36.6 C)  TempSrc: Oral  SpO2: 97%  Weight: 227 lb (103 kg)  Height: 5' 5"  (1.651 m)   97% on RA BMI Readings from Last 3 Encounters:  12/03/21 37.77 kg/m  11/05/21 36.32 kg/m  09/30/21 36.17 kg/m   Wt Readings from Last 3 Encounters:  12/03/21 227 lb (103 kg)  11/05/21 225 lb (102.1 kg)  09/30/21 224 lb 1.6 oz (101.7 kg)     CBC    Component Value Date/Time   WBC 8.5 07/09/2021 1953   RBC 5.15 07/09/2021 1953   HGB 14.5 07/09/2021 1953   HCT 45.6 07/09/2021 1953   PLT 312 07/09/2021 1953   MCV 88.5 07/09/2021 1953   MCH 28.2 07/09/2021 1953   MCHC 31.8 07/09/2021 1953   RDW 15.6 (H) 07/09/2021 1953   LYMPHSABS 1.1  07/09/2021 1953   MONOABS 0.6 07/09/2021 1953   EOSABS 0.1 07/09/2021 1953   BASOSABS 0.1 07/09/2021 1953    Chest Imaging:  CT Chest 10/04/21 OSH report reviewed by me: normal parenchyma, no PE  EGD 01/30/21: - Normal hypopharynx. - Normal esophagus. - Widely patent Schatzki ring. Dilated and disrupted. - 7 cm hiatal hernia. - Normal stomach. - Normal duodenal bulb. - Non-bleeding duodenal diverticulum. - No specimens collected.  Pulmonary Functions Testing Results:    Latest Ref Rng & Units 12/02/2021    4:26 PM  PFT Results  FVC-Pre L 2.85  P  FVC-Predicted Pre % 82  P  FVC-Post L 2.88  P  FVC-Predicted Post % 83  P  Pre FEV1/FVC % % 82  P  Post FEV1/FCV % % 84  P  FEV1-Pre L 2.34  P  FEV1-Predicted Pre % 93  P  FEV1-Post L 2.43  P  DLCO uncorrected ml/min/mmHg 24.33  P  DLCO UNC% % 112  P  DLCO corrected ml/min/mmHg 24.33  P  DLCO COR %Predicted % 112  P  DLVA Predicted % 114  P  TLC L 6.91  P  TLC % Predicted % 114  P  RV % Predicted % 169  P    P Preliminary result   PFT 12/02/21 reviewed by me with air trapping by reduced FVC/SVC but otherwise technically normal   Echocardiogram:   TTE 08/31/21 with G1DD  [facility] stress test 10/21/21 normal    Assessment & Plan:   # DOE  Unclear etiology. Deconditioning favored, possibility of diastolic heart failure playing role. His chronic prednisone could be masking obstruction on PFT though and he  does have a hint of air trapping (which could however also be due to obesity).    # Chronic cough Feel GERD/LPR/irritable larynx most likely culprit. Untreated OSA could be contributing to more severe GERD symptoms.  # BLE edema  # Snoring  Plan: - we will schedule cardiopulmonary exercise test, sleep study, DVT ultrasound - referral placed to GI in [place] - try anoro 1 puff once daily - if helpful for you send message through my chart or call and we'll refill it or a similar medication if need a more affordable  option - if not feeling better despite these measures and your lower extremity ultrasound is negative for blood clot it may be worth talking to your primary care or cardiologist about trial of lasix for possible diastolic heart failure - albuterol as needed - see you in 3 months to discuss   Follow up with Dr. Ander Slade in 3 months.   Maryjane Hurter, MD [facility] 12/03/2021 11:45 AM

## 2021-12-04 ENCOUNTER — Telehealth: Payer: Self-pay | Admitting: Student

## 2021-12-05 NOTE — Telephone Encounter (Signed)
Called and spoke to patient and went over his chart and did not see any messages that someone from our office called him. Nothing further needed

## 2021-12-09 ENCOUNTER — Ambulatory Visit (HOSPITAL_COMMUNITY)
Admission: RE | Admit: 2021-12-09 | Discharge: 2021-12-09 | Disposition: A | Discharge status: Home / Self Care | Payer: Medicare Other | Source: Ambulatory Visit | Attending: Student | Admitting: Student

## 2021-12-09 DIAGNOSIS — R600 Localized edema: Secondary | ICD-10-CM | POA: Diagnosis present

## 2021-12-11 ENCOUNTER — Ambulatory Visit (HOSPITAL_COMMUNITY): Payer: Medicare Other | Attending: Student

## 2021-12-11 DIAGNOSIS — R0609 Other forms of dyspnea: Secondary | ICD-10-CM | POA: Diagnosis present

## 2021-12-30 ENCOUNTER — Ambulatory Visit (INDEPENDENT_AMBULATORY_CARE_PROVIDER_SITE_OTHER): Payer: Medicare Other | Admitting: Gastroenterology

## 2021-12-30 ENCOUNTER — Telehealth: Payer: Self-pay | Admitting: Gastroenterology

## 2021-12-30 NOTE — Telephone Encounter (Signed)
Hi Dr. Candis Schatz,  Supervising Doc of the Day for 12/30/21, p.m.  This patient called requesting a transfer of care from Dr. Laural Golden, Gastroenterologist from [place].  He has retired and he wants to transfer his care to our facility.  His records are in Epic for your review.  Please let me know how you would like to proceed.    Thank you.

## 2022-01-01 LAB — LAB REPORT - SCANNED: EGFR: 56

## 2022-01-01 NOTE — Telephone Encounter (Signed)
Hi Dr. Candis Schatz,  Patients wife called today to check on status of review, she is highly concerned about her husbands condition states he is not able to eat, sleep and is in a lot of pain due to hiatal hernia. She is politely asking if you would reconsider the request. She said Dr. Laural Golden was aware of the hernia and Dr. Jenetta Downer is only wanting to provide him with medications and does not feel comfortable with that decision, they feel his symptoms are much more. She also wanted to include that the request is not for a second opinion they would like to remain at [facility] if at all possible.   Thank you

## 2022-01-02 ENCOUNTER — Encounter: Payer: Self-pay | Admitting: Gastroenterology

## 2022-01-10 ENCOUNTER — Ambulatory Visit: Payer: Medicare Other | Attending: Student | Admitting: Pulmonary Disease

## 2022-01-10 DIAGNOSIS — R0683 Snoring: Secondary | ICD-10-CM | POA: Insufficient documentation

## 2022-01-10 DIAGNOSIS — G478 Other sleep disorders: Secondary | ICD-10-CM

## 2022-01-16 NOTE — Procedures (Signed)
Patient Name: Herbert, Morrison Date: 01/10/2022 Gender: Male D.O.B: 08-Jun-1945 Age (years): 76 Referring Provider: Maryjane Hurter Height (inches): 64 Interpreting Physician: Kara Mead MD, ABSM Weight (lbs): 227 RPSGT: Peak, Robert BMI: 38 MRN: [id number] Neck Size: 16.50 <br> <br> CLINICAL INFORMATION Sleep Study Type: NPSG    Indication for sleep study: Snoring, non refreshing sleep    Epworth Sleepiness Score: 5    SLEEP STUDY TECHNIQUE As per the AASM Manual for the Scoring of Sleep and Associated Events v2.3 (April 2016) with a hypopnea requiring 4% desaturations.  The channels recorded and monitored were frontal, central and occipital EEG, electrooculogram (EOG), submentalis EMG (chin), nasal and oral airflow, thoracic and abdominal wall motion, anterior tibialis EMG, snore microphone, electrocardiogram, and pulse oximetry.  MEDICATIONS Medications self-administered by patient taken the night of the study : N/A  SLEEP ARCHITECTURE The study was initiated at 9:45:03 PM and ended at 4:48:06 AM.  Sleep onset time was 74.4 minutes and the sleep efficiency was 67.4%. The total sleep time was 285 minutes.  Stage REM latency was 299.5 minutes.  The patient spent 13.86% of the night in stage N1 sleep, 72.63% in stage N2 sleep, 0.00% in stage N3 and 13.5% in REM.  Alpha intrusion was absent.  Supine sleep was 1.23%.  RESPIRATORY PARAMETERS The overall apnea/hypopnea index (AHI) was 2.9 per hour but RDI was 48/h. There were 7 total apneas, including 4 obstructive, 3 central and 0 mixed apneas. There were 7 hypopneas and 213 RERAs.  The AHI during Stage REM sleep was 4.7 per hour.  AHI while supine was 0.0 per hour.  The mean oxygen saturation was 91.50%. The minimum SpO2 during sleep was 85.00%.  moderate snoring was noted during this study.  CARDIAC DATA The 2 lead EKG demonstrated sinus rhythm. The mean heart rate was 56.46 beats per minute. Other EKG  findings include: PVCs.   LEG MOVEMENT DATA The total PLMS were 0 with a resulting PLMS index of 0.00. Associated arousal with leg movement index was 0.0 .  IMPRESSIONS - Mild - moderate obstructive sleep apnea occurred during this study (AHI = 2.9/h)  but predominant RERAs with RDI 48 /h - No significant central sleep apnea occurred during this study (CAI = 0.6/h). - Mild oxygen desaturation was noted during this study (Min O2 = 85.00%). - The patient snored with moderate snoring volume. - EKG findings include PVCs. - Clinically significant periodic limb movements did not occur during sleep. No significant associated arousals.   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 - Sleep hygiene should be reviewed to assess factors that may improve sleep quality. - Weight management and regular exercise should be initiated or continued if appropriate.    Kara Mead MD Board Certified in [place]

## 2022-01-20 DIAGNOSIS — G478 Other sleep disorders: Secondary | ICD-10-CM | POA: Diagnosis not present

## 2022-01-20 DIAGNOSIS — R0683 Snoring: Secondary | ICD-10-CM | POA: Diagnosis not present

## 2022-02-06 ENCOUNTER — Ambulatory Visit (INDEPENDENT_AMBULATORY_CARE_PROVIDER_SITE_OTHER): Payer: Medicare Other | Admitting: Gastroenterology

## 2022-02-06 ENCOUNTER — Other Ambulatory Visit (INDEPENDENT_AMBULATORY_CARE_PROVIDER_SITE_OTHER): Payer: Medicare Other

## 2022-02-06 ENCOUNTER — Encounter: Payer: Self-pay | Admitting: Gastroenterology

## 2022-02-06 VITALS — BP 162/92 | HR 82 | Ht 65.0 in | Wt 225.0 lb

## 2022-02-06 DIAGNOSIS — K5120 Ulcerative (chronic) proctitis without complications: Secondary | ICD-10-CM | POA: Diagnosis not present

## 2022-02-06 DIAGNOSIS — K219 Gastro-esophageal reflux disease without esophagitis: Secondary | ICD-10-CM

## 2022-02-06 DIAGNOSIS — K51919 Ulcerative colitis, unspecified with unspecified complications: Secondary | ICD-10-CM

## 2022-02-06 DIAGNOSIS — K449 Diaphragmatic hernia without obstruction or gangrene: Secondary | ICD-10-CM | POA: Diagnosis not present

## 2022-02-06 LAB — C-REACTIVE PROTEIN: CRP: 1.2 mg/dL (ref 0.5–20.0)

## 2022-02-06 NOTE — Progress Notes (Signed)
HPI : Herbert Morrison is a pleasant 76 year old male with a history of ulcerative colitis, lupus, rheumatoid arthritis, GERD/hiatal hernia and anxiety/depression who is referred to [place] by Dr. Ledell Peoples for further evaluation and management of his hiatal hernia.  The patient was previously followed Dr. Matthew Saras who has since retired.  He was then seen by Dr. Jenetta Downer but the patient states that Dr. Jenetta Downer was not willing to address his upper GI symptoms, and only wanted to discuss his ulcerative colitis, so the patient requested a transfer of care.   The patient reports very bothersome upper GI symptoms since around January or February of this year.  Specifically, he reports a persistent chest discomfort/fullness/pressure as well as shortness of breath.  He reports undergoing an extensive evaluation by cardiology and pulmonary which was unrevealing to include a nuclear stress test, TTE and PFTs. In addition to his chest discomfort and dyspnea, he also reports very bothersome belching, regurgitation, heartburn and globus sensation.  He has these symptoms on a daily basis despite BID omeprazole.  He has a known hiatal hernia, and underwent an EGD in 2019 which showed a 7 cm hiatal hernia and widely patent Schatzki ring which was dilated and biopsied. A CT-PA in May was negative for PE, but did demonstrate a large hiatal hernia.  The patient has long-standing pan-UC, diagnosed in 1995.  He had previously been treated with mesalamine agents but reported that these drugs made him sick (worse diarrhea) so he has been off maintenance therapy for many years.  His last colonoscopy in 2022 showed active colitis and Dr. Jenetta Downer planned to Rinvoq, but the medication was going to prohibitively expensive for the patient, so he never took it.  He has been taking low dose prednisone for his lupus and RA and feels this has helped his UC.  He currently reports being in clinical remission, with 1-2 formed brown  stools daily, no urgency, tenesmus, nocturnal stools, abdominal pain or blood in the stool.  He was taking methotrexate but this was stopped because of insomnia.  He is not on any other medications for lupus/RA. His last colonoscopy was in Sept 2022 and he had active colitis as well as 2 SSPs in the ascending colon.   Colonoscopy: 01/30/2021 - Perianal skin tags found on perianal exam. - One 4 to 6 mm polyp in the proximal ascending colon. SSL. - One 6 mm polyp in the mid ascending colon, removed with a cold snare. Resected and retrieved - inflammatory polyp - One 7 mm polyp in the distal ascending colon, removed with a cold snare. Resected and retrieved. - benign mucosa - Congested mucosa in the ascending colon. Biopsied - active chronic colitis - Nodular mucosa at the hepatic flexure. Biopsied - mildly chronic colitis - Localized mild inflammation was found in the proximal transverse colon. Biopsied - chronic colitis - Diverticulosis in the sigmoid colon. - External hemorrhoids.   Colonoscopy:(04/09/16)- One small polyp in the cecum, removed with a cold snare. Complete resection. Polyp tissue not retrieved. - Normal mucosa in the entire examined colon. - Scar in the rectum and in the sigmoid colon. - Diverticulosis in the sigmoid colon. - The distal rectum and anal verge are normal on retroflexion view.  Upper Endoscopy: 01/30/2021 - Normal hypopharynx. - Normal esophagus. - Widely patent Schatzki ring. Dilated and disrupted. - 7 cm hiatal hernia. - Normal stomach. - Normal duodenal bulb. - Non-bleeding duodenal diverticulum. - No specimens collected.  Upper Endoscopy: (08/03/09) lower esophageal  stricture, dilated, biopsies of gastritis (benign stratified squamous mucosa with minimal focal hyperplasia with mild mononuclear cell infiltrate with rare eosinophil admixed.  Past Medical History:  Diagnosis Date   Anxiety    Arthritis    Cancer ([place])    skin cancer removed Left  shoulder Basal cell   Depression    GERD (gastroesophageal reflux disease)    Heart murmur    as a child   Hiatal hernia    History of hiatal hernia    Hypertension    Lupus ([place]) 12/31/2020   Lupus ([facility])    Pneumonia    Rheumatoid arthritis ([place]) 12/31/2020   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[facility])      Past Surgical History:  Procedure Laterality Date   BIOPSY  01/30/2021   Procedure: BIOPSY;  Surgeon: Rogene Houston, MD;  [facility];  Service: Endoscopy;;   COLONOSCOPY N/A 04/09/2016   Procedure: COLONOSCOPY;  Surgeon: Rogene Houston, MD;  [facility];  Service: Endoscopy;  Laterality: N/A;  1:45   COLONOSCOPY WITH PROPOFOL N/A 01/30/2021   Procedure: COLONOSCOPY WITH PROPOFOL;  Surgeon: Rogene Houston, MD;  [facility];  Service: Endoscopy;  Laterality: N/A;   ESOPHAGOGASTRODUODENOSCOPY (EGD) WITH PROPOFOL N/A 01/30/2021   Procedure: ESOPHAGOGASTRODUODENOSCOPY (EGD) WITH PROPOFOL;  Surgeon: Rogene Houston, MD;  [facility];  Service: Endoscopy;  Laterality: N/A;  2:00, pt knows to arrive at 11:00   [place] hip Dr Alvan Dame 03/24/17   left elbow     spur removed   left knee surgery     open surgery for a meniscus tear 1970's   POLYPECTOMY  04/09/2016   Procedure: POLYPECTOMY;  Surgeon: Rogene Houston, MD;  [facility];  Service: Endoscopy;;  cecal polyp   POLYPECTOMY  01/30/2021   Procedure: POLYPECTOMY;  Surgeon: Rogene Houston, MD;  [facility];  Service: Endoscopy;;   rt knee arthroscopy     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   Procedure: LEFT TOTAL HIP ARTHROPLASTY ANTERIOR APPROACH;  Surgeon: Paralee Cancel, MD;  Location: [facility];  Service: Orthopedics;  Laterality: Left;  70 mins   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   Procedure: TOTAL KNEE ARTHROPLASTY;  Surgeon: Paralee Cancel, MD;  Location: [facility];  Service: Orthopedics;  Laterality: Left;  70 mins   Family History  Problem  Relation Age of Onset   Leukemia Father    Bladder Cancer Father    Stomach cancer Neg Hx    Esophageal cancer Neg Hx    Colon cancer Neg Hx    Pancreatic cancer Neg Hx    Social History   Tobacco Use   Smoking status: Former   Smokeless tobacco: Former    Types: Chew    Quit date: 01/22/1965   Tobacco comments:    quit 43 years ago  Vaping Use   Vaping Use: Never used  Substance Use Topics   Alcohol use: Yes    Alcohol/week: 0.0 standard drinks of alcohol    Comment: occasionally   Drug use: No   Current Outpatient Medications  Medication Sig Dispense Refill   amLODipine-valsartan (EXFORGE) 10-320 MG tablet Take 1 tablet by mouth daily.     ammonium lactate (AMLACTIN) 12 % cream Apply 1 application topically daily as needed for dry skin.     buPROPion (WELLBUTRIN XL) 300 MG 24 hr tablet Take 300 mg by mouth daily.  Cyanocobalamin (B-12) 2500 MCG SUBL Place 2,500 tablets under the tongue daily.     cyclobenzaprine (FLEXERIL) 10 MG tablet Take 10 mg by mouth 3 (three) times daily as needed for muscle spasms.     escitalopram (LEXAPRO) 20 MG tablet Take 20 mg by mouth daily.      fluticasone (FLONASE) 50 MCG/ACT nasal spray Place 1 spray into both nostrils daily.     hydrALAZINE (APRESOLINE) 50 MG tablet Take 50 mg by mouth daily at 6 (six) AM.     ibuprofen (ADVIL) 200 MG tablet Take 600 mg by mouth daily as needed for mild pain (Joint inflammation).     LORazepam (ATIVAN) 1 MG tablet Take 1 mg by mouth 2 (two) times daily.     metoprolol succinate (TOPROL-XL) 50 MG 24 hr tablet Take 50 mg by mouth daily. Take with or immediately following a meal.     Omeprazole-Sodium Bicarbonate (ZEGERID) 20-1100 MG CAPS capsule Take 1 capsule by mouth daily before breakfast.     OVER THE COUNTER MEDICATION daily at 6 (six) AM. Aller-zyr once per day.     predniSONE (DELTASONE) 5 MG tablet Take 10 mg by mouth daily.     zinc gluconate 50 MG tablet Take 50 mg by mouth daily.     No  current facility-administered medications for this visit.   Allergies  Allergen Reactions   Remicade [Infliximab] Anaphylaxis   Erythromycin Nausea Only   Grass Pollen(K-O-R-T-Swt Vern) Other (See Comments)    Seasonal allergies    Pollen Extract Other (See Comments)   Sulfasalazine Nausea And Vomiting     Review of Systems: All systems reviewed and negative except where noted in HPI.    SLEEP STUDY DOCUMENTS  Result Date: 01/17/2022 Ordered by an unspecified provider.  Polysomnography 4 or more parameters (NPSG)  Result Date: 01/10/2022 Rigoberto Noel, MD     01/16/2022  3:03 PM Patient Name: Axcel, Horsch Date: 01/10/2022 Gender: Male D.O.B: August 15, 1945 Age (years): 76 Referring Provider: Maryjane Hurter Height (inches): 51 Interpreting Physician: Kara Mead MD, ABSM Weight (lbs): 227 RPSGT: Peak, Robert BMI: 38 MRN: [id number] Neck Size: 16.50 <br> <br> CLINICAL INFORMATION Sleep Study Type: NPSG Indication for sleep study: Snoring, non refreshing sleep Epworth Sleepiness Score: 5 SLEEP STUDY TECHNIQUE As per the AASM Manual for the Scoring of Sleep and Associated Events v2.3 (April 2016) with a hypopnea requiring 4% desaturations. The channels recorded and monitored were frontal, central and occipital EEG, electrooculogram (EOG), submentalis EMG (chin), nasal and oral airflow, thoracic and abdominal wall motion, anterior tibialis EMG, snore microphone, electrocardiogram, and pulse oximetry. MEDICATIONS Medications self-administered by patient taken the night of the study : N/A SLEEP ARCHITECTURE The study was initiated at 9:45:03 PM and ended at 4:48:06 AM. Sleep onset time was 74.4 minutes and the sleep efficiency was 67.4%. The total sleep time was 285 minutes. Stage REM latency was 299.5 minutes. The patient spent 13.86% of the night in stage N1 sleep, 72.63% in stage N2 sleep, 0.00% in stage N3 and 13.5% in REM. Alpha intrusion was absent. Supine sleep was 1.23%. RESPIRATORY  PARAMETERS The overall apnea/hypopnea index (AHI) was 2.9 per hour but RDI was 48/h. There were 7 total apneas, including 4 obstructive, 3 central and 0 mixed apneas. There were 7 hypopneas and 213 RERAs. The AHI during Stage REM sleep was 4.7 per hour. AHI while supine was 0.0 per hour. The mean oxygen saturation was 91.50%. The minimum SpO2 during sleep was 85.00%. moderate  snoring was noted during this study. CARDIAC DATA The 2 lead EKG demonstrated sinus rhythm. The mean heart rate was 56.46 beats per minute. Other EKG findings include: PVCs. LEG MOVEMENT DATA The total PLMS were 0 with a resulting PLMS index of 0.00. Associated arousal with leg movement index was 0.0 . IMPRESSIONS - Mild - moderate obstructive sleep apnea occurred during this study (AHI = 2.9/h)  but predominant RERAs with RDI 48 /h - No significant central sleep apnea occurred during this study (CAI = 0.6/h). - Mild oxygen desaturation was noted during this study (Min O2 = 85.00%). - The patient snored with moderate snoring volume. - EKG findings include PVCs. - Clinically significant periodic limb movements did not occur during sleep. No significant associated arousals. 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 - Sleep hygiene should be reviewed to assess factors that may improve sleep quality. - Weight management and regular exercise should be initiated or continued if appropriate. Kara Mead MD Board Certified in Sleep medicine    Physical Exam: BP (!) 162/92   Pulse 82   Ht 5' 5"  (1.651 m)   Wt 225 lb (102.1 kg)   SpO2 96%   BMI 37.44 kg/m  Constitutional: Pleasant,well-developed, Caucasian male in no acute distress.  Accompanied by spouse. HEENT: Normocephalic and atraumatic.  Conjunctivae are normal. No scleral icterus. Neck supple.  Cardiovascular: Normal rate, regular rhythm.  Pulmonary/chest: Effort normal and breath sounds normal. No wheezing, rales or rhonchi. Abdominal: Soft, nondistended, nontender. Bowel sounds active throughout. There are no masses palpable. No hepatomegaly. Extremities: no edema Neurological: Alert and oriented to person place and time. Skin: Skin is warm and dry. No rashes noted. Psychiatric: Anxious.  Patient noted to sway from side to side in his chair during the entire encounter.  Behavior is normal.  CBC    Component Value Date/Time   WBC 8.5 07/09/2021 1953   RBC 5.15 07/09/2021 1953   HGB 14.5 07/09/2021 1953   HCT 45.6 07/09/2021 1953   PLT 312 07/09/2021 1953   MCV 88.5 07/09/2021 1953   MCH 28.2 07/09/2021 1953   MCHC 31.8 07/09/2021 1953   RDW 15.6 (H) 07/09/2021 1953   LYMPHSABS 1.1 07/09/2021 1953   MONOABS 0.6 07/09/2021 1953   EOSABS 0.1 07/09/2021 1953   BASOSABS 0.1 07/09/2021 1953    CMP     Component Value Date/Time   NA 140 07/09/2021 1953   K 4.7 07/09/2021 1953   CL 104 07/09/2021 1953   CO2 27 07/09/2021 1953   GLUCOSE 107 (H) 07/09/2021 1953   BUN 23 07/09/2021 1953   CREATININE 1.40 (H) 07/09/2021 1953   CREATININE 1.50 (H) 04/15/2021 1314   CALCIUM 10.0 07/09/2021 1953   PROT 7.9 07/09/2021 1953   ALBUMIN 4.0 07/09/2021 1953   AST 14 (L) 07/09/2021 1953   ALT 19 07/09/2021 1953   ALKPHOS 44 07/09/2021 1953   BILITOT 0.4 07/09/2021 1953   GFRNONAA 53 (L) 07/09/2021 1953   GFRAA >60 08/12/2019 0427     ASSESSMENT AND PLAN: 76 year old male with longstanding UC, GERD with hiatal hernia, lupus, rheumatoid arthritis and anxiety with persistent distressing symptoms of chest pain/pressure, shortness of breath and excessive belching, regurgitation and globus sensation.  The patient has a known hiatal hernia  which was measured at 7 cm back in 2019.  A CT-PA in May of this year reported a  "large hiatal hernia".  The images were not available for review.  It is possible that the patient's chest discomfort and shortness of breath may be related to his hiatal hernia.  It is likely that his other symptoms such as belching, regurgitation and globus sensation are related to his hiatal hernia.  I think a surgical evaluation will be very reasonable.  I did advise the patient that not all of his symptoms may completely resolve with surgical correction of his hernia, adding that anxiety can also create many symptoms such as chest pain, shortness of breath and globus sensation.  I recommend a repeat upper endoscopy to reassess the hiatal hernia.  Will refer to general surgery to discuss potential hiatal hernia reduction. Given that the patient is currently in clinical remission from ulcerative colitis standpoint, and he seems very preoccupied with his upper GI symptoms, we did not discuss management of his UC very much today.  I informed him I would like to follow-up with him in about 4 months to discuss maintenance therapy of his ulcerative colitis.  I suspect his low-dose prednisone is probably contributing to his control of ulcerative colitis, but imagine that he will need a steroid sparing agent for his RA as well as his UC. I discussed the importance of controlling his ulcerative colitis in terms of reducing the risk of him having flares and complications, as well as reducing the risk of colon cancer. The patient had tube sessile serrated polyps on his last colonoscopy in 2022.  He should undergo surveillance colonoscopy in the next year.   Chest pain/pressure, shortness of breath, possibly related to hiatal hernia - EGD - Gen Surg referral  Excessive belching, regurgitation, globus sensation - Likely related to hiatal hernia - EGD/GEN surge referral as above -Continue Zegerid  Longstanding pan-UC, in clinical remission on no maintenance therapy -Currently on low-dose prednisone for  RA/lupus -We will readdress maintenance therapy options at follow-up - Patient will need dysplasia surveillance colonoscopy within the next year -Recheck CRP, as this has previously paralleled his disease activity.  The details, risks (including bleeding, perforation, infection, missed lesions, medication reactions and possible hospitalization or surgery if complications occur), benefits, and alternatives to EGD with possible biopsy and possible dilation were discussed with the patient and he consents to proceed.   Marrion Finan E. Candis Schatz, MD [facility]   CC:  Andres Shad, *

## 2022-02-06 NOTE — Patient Instructions (Signed)
If you are age 74 or older, your body mass index should be between 23-30. Your Body mass index is 37.44 kg/m. If this is out of the aforementioned range listed, please consider follow up with your Primary Care Provider.  If you are age 55 or younger, your body mass index should be between 19-25. Your Body mass index is 37.44 kg/m. If this is out of the aformentioned range listed, please consider follow up with your Primary Care Provider.   You have been scheduled for an endoscopy. Please follow written instructions given to you at your visit today. If you use inhalers (even only as needed), please bring them with you on the day of your procedure.  Your provider has requested that you go to the basement level for lab work before leaving today. Press "B" on the elevator. The lab is located at the first door on the left as you exit the elevator.  The [place] [facility] providers would like to encourage you to use [facility] to communicate with providers for non-urgent requests or questions.  Due to long hold times on the telephone, sending your provider a message by [facility] may be a faster and more efficient way to get a response.  Please allow 48 business hours for a response.  Please remember that this is for non-urgent requests.   It was a pleasure to see you today!  Thank you for trusting me with your gastrointestinal care!    Scott E.Candis Schatz, MD

## 2022-02-07 ENCOUNTER — Encounter: Payer: Self-pay | Admitting: Gastroenterology

## 2022-02-07 ENCOUNTER — Ambulatory Visit (AMBULATORY_SURGERY_CENTER): Payer: Medicare Other | Admitting: Gastroenterology

## 2022-02-07 VITALS — BP 115/70 | HR 68 | Temp 98.7°F | Resp 17 | Ht 65.0 in | Wt 225.0 lb

## 2022-02-07 DIAGNOSIS — R0789 Other chest pain: Secondary | ICD-10-CM

## 2022-02-07 DIAGNOSIS — K449 Diaphragmatic hernia without obstruction or gangrene: Secondary | ICD-10-CM | POA: Diagnosis not present

## 2022-02-07 DIAGNOSIS — K219 Gastro-esophageal reflux disease without esophagitis: Secondary | ICD-10-CM

## 2022-02-07 MED ORDER — SODIUM CHLORIDE 0.9 % IV SOLN: 500.0000 mL | INTRAVENOUS | Status: DC

## 2022-02-07 NOTE — Patient Instructions (Signed)
Resume previous diet and medications. Follow up with General Surgery to discuss hiatal hernia repair. Obtain CD of CT scan in May prior to appointment with General Surgery.  YOU HAD AN ENDOSCOPIC PROCEDURE TODAY AT [facility]:   Refer to the procedure report that was given to you for any specific questions about what was found during the examination.  If the procedure report does not answer your questions, please call your gastroenterologist to clarify.  If you requested that your care partner not be given the details of your procedure findings, then the procedure report has been included in a sealed envelope for you to review at your convenience later.  YOU SHOULD EXPECT: Some feelings of bloating in the abdomen. Passage of more gas than usual.  Walking can help get rid of the air that was put into your GI tract during the procedure and reduce the bloating. If you had a lower endoscopy (such as a colonoscopy or flexible sigmoidoscopy) you may notice spotting of blood in your stool or on the toilet paper. If you underwent a bowel prep for your procedure, you may not have a normal bowel movement for a few days.  Please Note:  You might notice some irritation and congestion in your nose or some drainage.  This is from the oxygen used during your procedure.  There is no need for concern and it should clear up in a day or so.  SYMPTOMS TO REPORT IMMEDIATELY:  Following upper endoscopy (EGD)  Vomiting of blood or coffee ground material  New chest pain or pain under the shoulder blades  Painful or persistently difficult swallowing  New shortness of breath  Fever of 100F or higher  Black, tarry-looking stools  For urgent or emergent issues, a gastroenterologist can be reached at any hour by calling [phone]. Do not use MyChart messaging for urgent concerns.    DIET:  We do recommend a small meal at first, but then you may proceed to your regular diet.  Drink plenty of fluids  but you should avoid alcoholic beverages for 24 hours.  ACTIVITY:  You should plan to take it easy for the rest of today and you should NOT DRIVE or use heavy machinery until tomorrow (because of the sedation medicines used during the test).    FOLLOW UP: Our staff will call the number listed on your records the next business day following your procedure.  We will call around 7:15- 8:00 am to check on you and address any questions or concerns that you may have regarding the information given to you following your procedure. If we do not reach you, we will leave a message.     If any biopsies were taken you will be contacted by phone or by letter within the next 1-3 weeks.  Please call us at [phone] if you have not heard about the biopsies in 3 weeks.    SIGNATURES/CONFIDENTIALITY: You and/or your care partner have signed paperwork which will be entered into your electronic medical record.  These signatures attest to the fact that that the information above on your After Visit Summary has been reviewed and is understood.  Full responsibility of the confidentiality of this discharge information lies with you and/or your care-partner.

## 2022-02-07 NOTE — Progress Notes (Signed)
History and Physical Interval Note:  02/07/2022 9:54 AM  Herbert Morrison  has presented today for endoscopic procedure(s), with the diagnosis of  Encounter Diagnosis  Name Primary?   Gastroesophageal reflux disease, unspecified whether esophagitis present Yes  .  The various methods of evaluation and treatment have been discussed with the patient and/or family. After consideration of risks, benefits and other options for treatment, the patient has consented to  the endoscopic procedure(s).   The patient's history has been reviewed, patient examined, no change in status, stable for endoscopic procedure(s).  I have reviewed the patient's chart and labs.  Questions were answered to the patient's satisfaction.     Tyrelle Raczka E. Candis Schatz, MD [facility]

## 2022-02-07 NOTE — Progress Notes (Signed)
Pt's states no medical or surgical changes since previsit or office visit. 

## 2022-02-07 NOTE — Progress Notes (Signed)
Pt in recovery with monitors in place, VSS. Report given to receiving RN. Bite guard was placed with pt awake to ensure comfort. No dental or soft tissue damage noted.

## 2022-02-07 NOTE — Op Note (Signed)
[facility] Patient Name: Herbert Morrison Procedure Date: 02/07/2022 9:55 AM MRN: [id number] Endoscopist: Nicki Reaper E. Candis Schatz , MD Age: 76 Referring MD:  Date of Birth: 1945/10/07 Gender: Male Account #: [id number] Procedure:                Upper GI endoscopy Indications:              Hiatal hernia, Chest pain (non cardiac) Medicines:                Monitored Anesthesia Care Procedure:                Pre-Anesthesia Assessment:                           - Prior to the procedure, a History and Physical                            was performed, and patient medications and                            allergies were reviewed. The patient's tolerance of                            previous anesthesia was also reviewed. The risks                            and benefits of the procedure and the sedation                            options and risks were discussed with the patient.                            All questions were answered, and informed consent                            was obtained. Prior Anticoagulants: The patient has                            taken no previous anticoagulant or antiplatelet                            agents. ASA Grade Assessment: II - A patient with                            mild systemic disease. After reviewing the risks                            and benefits, the patient was deemed in                            satisfactory condition to undergo the procedure.                           After obtaining informed consent, the endoscope was  passed under direct vision. Throughout the                            procedure, the patient's blood pressure, pulse, and                            oxygen saturations were monitored continuously. The                            Endoscope was introduced through the mouth, and                            advanced to the second part of duodenum. The upper                            GI endoscopy  was accomplished without difficulty.                            The patient tolerated the procedure well. Scope In: Scope Out: Findings:                 The examined portions of the nasopharynx,                            oropharynx and larynx were normal.                           A single area of ectopic gastric mucosa was found                            in the upper third of the esophagus.                           The examined esophagus was normal.                           A 6 cm hiatal hernia was present.                           The exam of the stomach was otherwise normal.                           The examined duodenum was normal. Complications:            No immediate complications. Estimated Blood Loss:     Estimated blood loss: none. Impression:               - The examined portions of the nasopharynx,                            oropharynx and larynx were normal.                           - Ectopic gastric mucosa in the upper third of the  esophagus.                           - Normal esophagus.                           - 6 cm hiatal hernia. This may be a factor in the                            patient's chest discomfort.                           - Normal examined duodenum.                           - No specimens collected. Recommendation:           - Patient has a contact number available for                            emergencies. The signs and symptoms of potential                            delayed complications were discussed with the                            patient. Return to normal activities tomorrow.                            Written discharge instructions were provided to the                            patient.                           - Resume previous diet.                           - Continue present medications.                           - Follow up with General Surgery to discuss hiatal                            hernia  repair                           - Obtain CD of CT scan in May prior to appointment                            with General Surgery Zyren Sevigny E. Candis Schatz, MD 02/07/2022 10:20:33 AM This report has been signed electronically.

## 2022-02-10 ENCOUNTER — Telehealth: Payer: Self-pay | Admitting: *Deleted

## 2022-02-10 ENCOUNTER — Telehealth: Payer: Self-pay

## 2022-02-10 NOTE — Progress Notes (Signed)
Herbert Morrison, Good news, your CRP was within normal limits, indicating that your ulcerative colitis is likely inactive.  This is most likely due to the prednisone for your RA as we discussed.  I would like to discuss better options for ulcerative colitis maintenance once your hiatal hernia has been completely evaluated by general surgery. Please follow-up with me in the office in 3 to 4 months.

## 2022-02-10 NOTE — Telephone Encounter (Signed)
Lynnell Grain Y, CMA  You 5 minutes ago (1:32 PM)    yes    You routed conversation to Darrall Dears, CMA 36 minutes ago (1:01 PM)   You 36 minutes ago (1:01 PM)   [place] thanks.

## 2022-02-10 NOTE — Telephone Encounter (Signed)
Was pt referred to surgeon at time of office visit?

## 2022-02-10 NOTE — Telephone Encounter (Signed)
  Follow up Call-    Row Labels 02/07/2022    9:36 AM  Call back number   Section Header. No data exists in this row.   Post procedure Call Back phone  #   [phone]  Permission to leave phone message   Yes     Patient questions:  Do you have a fever, pain , or abdominal swelling? No. Pain Score  0 *  Have you tolerated food without any problems? Yes.    Have you been able to return to your normal activities? Yes.    Do you have any questions about your discharge instructions: Diet   No. Medications  No. Follow up visit  No.  Do you have questions or concerns about your Care? No.  Actions: * If pain score is 4 or above: No action needed, pain <4.

## 2022-02-24 NOTE — Progress Notes (Unsigned)
Synopsis: Referred for dyspnea by No ref. provider found  Subjective:   PATIENT ID: Herbert Morrison GENDER: male DOB: 20-Oct-1945, MRN: [id number]  No chief complaint on file.  76yM with history of GERD, hiatal hernia, SLE/RA, UC seen 11/05/21 by Dr. Ander Slade for chest heaviness/discomfort, DOE of unclear etiology. He was encouraged to increase dose of prednisone to 20 mg daily for 7d but failed to note any improvement with this change. Records requested. PFT yesterday shows a little bit of air trapping by reduced FVC/SVC but otherwise technically normal.   He thinks albuterol is maybe helpful.  DOE to 25-50 feet. Cough is dry but bothersome. He recently tried decreasing his omeprazole to 20 mg daily but self-increased to 20 mg twice daily due to more trouble with reflux.   He does snore but has had PSG that was normal 2-3 years ago. He thinks he has gained 25-30 lb since then. He does feel sleepy during the day.    He smoked for about 5-6 years 1 pack every 4 days typically in distant past.   Interval HPI:  CPEX with hypertensive response to exercise, hyperventilation at end exercise, normal max VO2 (105% predicted).   PSG with AHI 2.9, RDI though of 48/hr  DVT US neg  Otherwise pertinent review of systems is negative.    Past Medical History:  Diagnosis Date   Anxiety    Arthritis    Cancer ([place])    skin cancer removed Left shoulder Basal cell   Depression    GERD (gastroesophageal reflux disease)    Heart murmur    as a child   Hiatal hernia    History of hiatal hernia    Hypertension    Lupus ([place]) 12/31/2020   Lupus ([facility])    Pneumonia    Rheumatoid arthritis ([place]) 12/31/2020   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([place])      Family History  Problem Relation Age of Onset   Leukemia Father    Bladder Cancer Father    Stomach cancer Neg Hx    Esophageal cancer Neg Hx    Colon cancer Neg Hx    Pancreatic cancer Neg Hx      Past Surgical History:  Procedure Laterality  Date   BIOPSY  01/30/2021   Procedure: BIOPSY;  Surgeon: Rogene Houston, MD;  [facility];  Service: Endoscopy;;   COLONOSCOPY N/A 04/09/2016   Procedure: COLONOSCOPY;  Surgeon: Rogene Houston, MD;  [facility];  Service: Endoscopy;  Laterality: N/A;  1:45   COLONOSCOPY WITH PROPOFOL N/A 01/30/2021   Procedure: COLONOSCOPY WITH PROPOFOL;  Surgeon: Rogene Houston, MD;  [facility];  Service: Endoscopy;  Laterality: N/A;   ESOPHAGOGASTRODUODENOSCOPY (EGD) WITH PROPOFOL N/A 01/30/2021   Procedure: ESOPHAGOGASTRODUODENOSCOPY (EGD) WITH PROPOFOL;  Surgeon: Rogene Houston, MD;  [facility];  Service: Endoscopy;  Laterality: N/A;  2:00, pt knows to arrive at 11:00   [place] hip Dr Alvan Dame 03/24/17   left elbow     spur removed   left knee surgery     open surgery for a meniscus tear 1970's   POLYPECTOMY  04/09/2016   Procedure: POLYPECTOMY;  Surgeon: Rogene Houston, MD;  [facility];  Service: Endoscopy;;  cecal polyp   POLYPECTOMY  01/30/2021   Procedure: POLYPECTOMY;  Surgeon: Rogene Houston, MD;  [facility];  Service: Endoscopy;;   rt knee arthroscopy  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   Procedure: LEFT TOTAL HIP ARTHROPLASTY ANTERIOR APPROACH;  Surgeon: Paralee Cancel, MD;  Location: [facility];  Service: Orthopedics;  Laterality: Left;  70 mins   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   Procedure: TOTAL KNEE ARTHROPLASTY;  Surgeon: Paralee Cancel, MD;  Location: [facility];  Service: Orthopedics;  Laterality: Left;  70 mins    Social History   Socioeconomic History   Marital status: Married    Spouse name: Not on file   Number of children: Not on file   Years of education: Not on file   Highest education level: Not on file  Occupational History   Not on file  Tobacco Use   Smoking status: Former   Smokeless tobacco: Former    Types: Chew    Quit date: 01/22/1965   Tobacco comments:    quit  43 years ago  Vaping Use   Vaping Use: Never used  Substance and Sexual Activity   Alcohol use: Yes    Alcohol/week: 0.0 standard drinks of alcohol    Comment: occasionally   Drug use: No   Sexual activity: Yes  Other Topics Concern   Not on file  Social History Narrative   Not on file   Social Determinants of Health   Financial Resource Strain: Not on file  Food Insecurity: Not on file  Transportation Needs: Not on file  Physical Activity: Not on file  Stress: Not on file  Social Connections: Not on file  Intimate Partner Violence: Not on file     Allergies  Allergen Reactions   Remicade [Infliximab] Anaphylaxis   Erythromycin Nausea Only   Grass Pollen(K-O-R-T-Swt Vern) Other (See Comments)    Seasonal allergies    Pollen Extract Other (See Comments)   Sulfasalazine Nausea And Vomiting     Outpatient Medications Prior to Visit  Medication Sig Dispense Refill   amLODipine-valsartan (EXFORGE) 10-320 MG tablet Take 1 tablet by mouth daily.     ammonium lactate (AMLACTIN) 12 % cream Apply 1 application topically daily as needed for dry skin. (Patient not taking: Reported on 02/07/2022)     buPROPion (WELLBUTRIN XL) 300 MG 24 hr tablet Take 300 mg by mouth daily.     Cyanocobalamin (B-12) 2500 MCG SUBL Place 2,500 tablets under the tongue daily.     cyclobenzaprine (FLEXERIL) 10 MG tablet Take 10 mg by mouth 3 (three) times daily as needed for muscle spasms.     escitalopram (LEXAPRO) 20 MG tablet Take 20 mg by mouth daily.      fluticasone (FLONASE) 50 MCG/ACT nasal spray Place 1 spray into both nostrils daily.     furosemide (LASIX) 20 MG tablet Take 20 mg by mouth daily.     hydrALAZINE (APRESOLINE) 50 MG tablet Take 50 mg by mouth daily at 6 (six) AM.     ibuprofen (ADVIL) 200 MG tablet Take 600 mg by mouth daily as needed for mild pain (Joint inflammation).     ketoconazole (NIZORAL) 2 % shampoo      levocetirizine (XYZAL) 5 MG tablet Take 5 mg by mouth daily.      LORazepam (ATIVAN) 1 MG tablet Take 1 mg by mouth 2 (two) times daily.     metoprolol succinate (TOPROL-XL) 50 MG 24 hr tablet Take 50 mg by mouth daily. Take with or immediately following a meal.     Omeprazole-Sodium Bicarbonate (ZEGERID) 20-1100 MG CAPS capsule Take 1 capsule by mouth daily before breakfast.  OVER THE COUNTER MEDICATION daily at 6 (six) AM. Aller-zyr once per day.     predniSONE (DELTASONE) 5 MG tablet Take 10 mg by mouth daily.     zinc gluconate 50 MG tablet Take 50 mg by mouth daily.     No facility-administered medications prior to visit.       Objective:   Physical Exam:  General appearance: 76 y.o., male, NAD, conversant  Eyes: anicteric sclerae; PERRL, tracking appropriately HENT: NCAT; MMM Neck: Trachea midline; no lymphadenopathy, no JVD Lungs: CTAB, no crackles, no wheeze, with normal respiratory effort CV: RRR, no murmur  Abdomen: Soft, non-tender; non-distended, BS present  Extremities: 2+ BLE pitting edema, warm Skin: Normal turgor and texture; no rash Psych: Appropriate affect Neuro: Alert and oriented to person and place, no focal deficit     There were no vitals filed for this visit.    on RA BMI Readings from Last 3 Encounters:  02/07/22 37.44 kg/m  02/06/22 37.44 kg/m  12/03/21 37.77 kg/m   Wt Readings from Last 3 Encounters:  02/07/22 225 lb (102.1 kg)  02/06/22 225 lb (102.1 kg)  12/03/21 227 lb (103 kg)     CBC    Component Value Date/Time   WBC 8.5 07/09/2021 1953   RBC 5.15 07/09/2021 1953   HGB 14.5 07/09/2021 1953   HCT 45.6 07/09/2021 1953   PLT 312 07/09/2021 1953   MCV 88.5 07/09/2021 1953   MCH 28.2 07/09/2021 1953   MCHC 31.8 07/09/2021 1953   RDW 15.6 (H) 07/09/2021 1953   LYMPHSABS 1.1 07/09/2021 1953   MONOABS 0.6 07/09/2021 1953   EOSABS 0.1 07/09/2021 1953   BASOSABS 0.1 07/09/2021 1953    Chest Imaging:  CT Chest 10/04/21 OSH report reviewed by me: normal parenchyma, no PE  EGD 01/30/21: -  Normal hypopharynx. - Normal esophagus. - Widely patent Schatzki ring. Dilated and disrupted. - 7 cm hiatal hernia. - Normal stomach. - Normal duodenal bulb. - Non-bleeding duodenal diverticulum. - No specimens collected.  DVT US BLE 12/09/21 WNL  Pulmonary Functions Testing Results:    Latest Ref Rng & Units 12/02/2021    4:26 PM  PFT Results  FVC-Pre L 2.85   FVC-Predicted Pre % 82   FVC-Post L 2.88   FVC-Predicted Post % 83   Pre FEV1/FVC % % 82   Post FEV1/FCV % % 84   FEV1-Pre L 2.34   FEV1-Predicted Pre % 93   FEV1-Post L 2.43   DLCO uncorrected ml/min/mmHg 24.33   DLCO UNC% % 112   DLCO corrected ml/min/mmHg 24.33   DLCO COR %Predicted % 112   DLVA Predicted % 114   TLC L 6.91   TLC % Predicted % 114   RV % Predicted % 169    PFT 12/02/21 reviewed by me with air trapping by reduced FVC/SVC but otherwise technically normal  CPEX 12/11/21: Exercise testing with gas exchange demonstrates normal functional capacity when compared to matched sedentary norms. There is no clear cardiopulmonary abnormality. Patient appears primarily limited due to body habitus. VE/VCO2 slope is moderately elevated and could indicate increased pulmonary pressures, however with low PETCO2, this could more like be associated with hyperventilation at peak exercise. There was a hypertensive response to note at peak exercise.  Echocardiogram:   TTE 08/31/21 with G1DD  [facility] stress test 10/21/21 normal    Assessment & Plan:   # DOE  Unclear etiology. Deconditioning favored, possibility of diastolic heart failure playing role. His chronic prednisone could  be masking obstruction on PFT though and he does have a hint of air trapping (which could however also be due to obesity).    # Chronic cough Feel GERD/LPR/irritable larynx most likely culprit. Untreated OSA could be contributing to more severe GERD symptoms.  # BLE edema  # Snoring  Plan: - we will schedule cardiopulmonary exercise test,  sleep study, DVT ultrasound - referral placed to GI in [place] - try anoro 1 puff once daily - if helpful for you send message through my chart or call and we'll refill it or a similar medication if need a more affordable option - if not feeling better despite these measures and your lower extremity ultrasound is negative for blood clot it may be worth talking to your primary care or cardiologist about trial of lasix for possible diastolic heart failure - albuterol as needed - see you in 3 months to discuss   Follow up with Dr. Ander Slade in 3 months.   Maryjane Hurter, MD [facility] 02/24/2022 4:49 PM

## 2022-02-25 ENCOUNTER — Encounter: Payer: Self-pay | Admitting: Student

## 2022-02-25 ENCOUNTER — Ambulatory Visit (INDEPENDENT_AMBULATORY_CARE_PROVIDER_SITE_OTHER): Payer: Medicare Other | Admitting: Student

## 2022-02-25 VITALS — BP 144/80 | HR 56 | Temp 98.5°F | Ht 65.0 in | Wt 227.0 lb

## 2022-02-25 DIAGNOSIS — R0609 Other forms of dyspnea: Secondary | ICD-10-CM | POA: Diagnosis not present

## 2022-02-25 DIAGNOSIS — G478 Other sleep disorders: Secondary | ICD-10-CM | POA: Diagnosis not present

## 2022-02-25 DIAGNOSIS — Z23 Encounter for immunization: Secondary | ICD-10-CM | POA: Diagnosis not present

## 2022-02-25 DIAGNOSIS — R053 Chronic cough: Secondary | ICD-10-CM

## 2022-02-25 NOTE — Patient Instructions (Addendum)
-   I'll send message to Dr. Candis Schatz to discuss referral to surgery, can also try calling [facility] to see if this referral was made. [phone]. - We will place order for autopap with nasal pillows  - I'll see you in 3 months to see how it's working for you!

## 2022-02-26 ENCOUNTER — Telehealth: Payer: Self-pay

## 2022-02-26 NOTE — Telephone Encounter (Signed)
Contacted [facility] to inquire about a referral sent for [employer]. I spoke with Gabriel Cirri and stated that they attempted to call Mr.Jakubiak yesteday 10/17 and did not get an answer but left a VM for patient to return call. I scheduled patient for and appointment on 03/11/22 @ 11am arrive @ 10:30am.  Patient was contacted and made aware of the appointment. Patient stated he put it on his calender and had no questions at the end of call.

## 2022-03-11 ENCOUNTER — Telehealth: Payer: Self-pay | Admitting: Student

## 2022-03-12 ENCOUNTER — Telehealth: Payer: Self-pay

## 2022-03-12 ENCOUNTER — Other Ambulatory Visit: Payer: Self-pay

## 2022-03-12 DIAGNOSIS — K219 Gastro-esophageal reflux disease without esophagitis: Secondary | ICD-10-CM

## 2022-03-12 DIAGNOSIS — K449 Diaphragmatic hernia without obstruction or gangrene: Secondary | ICD-10-CM

## 2022-03-12 NOTE — Telephone Encounter (Signed)
Spoke with the patient about the referral received from Dr Alwyn Pea of [facility].  Advised he is scheduled for the first available opening which is 07/09/22 at 12:30 pm.  Advised the patient I have left a message for the referral coordinator at [facility] to let the surgeon know of the date in case they wanted to refer to another location. Information about the procedure will be mailed to the patient. He had no questions at the end of the call.

## 2022-03-12 NOTE — Telephone Encounter (Signed)
Spoke with [employer] who stated they needed the office note of when the sleep study was ordered and the sleep study results which were printed and faxed to them. Updated Herbert Morrison who stated understanding. Nothing further needed at this time.

## 2022-03-13 ENCOUNTER — Other Ambulatory Visit (HOSPITAL_COMMUNITY): Payer: Self-pay | Admitting: Surgery

## 2022-03-13 DIAGNOSIS — K449 Diaphragmatic hernia without obstruction or gangrene: Secondary | ICD-10-CM

## 2022-03-13 DIAGNOSIS — Z9889 Other specified postprocedural states: Secondary | ICD-10-CM

## 2022-03-13 DIAGNOSIS — R1310 Dysphagia, unspecified: Secondary | ICD-10-CM

## 2022-03-13 DIAGNOSIS — K2210 Ulcer of esophagus without bleeding: Secondary | ICD-10-CM

## 2022-03-20 ENCOUNTER — Ambulatory Visit (HOSPITAL_COMMUNITY)
Admission: RE | Admit: 2022-03-20 | Discharge: 2022-03-20 | Disposition: A | Discharge status: Home / Self Care | Payer: Medicare Other | Source: Ambulatory Visit | Attending: Surgery | Admitting: Surgery

## 2022-03-20 DIAGNOSIS — R1310 Dysphagia, unspecified: Secondary | ICD-10-CM | POA: Diagnosis present

## 2022-03-20 DIAGNOSIS — K449 Diaphragmatic hernia without obstruction or gangrene: Secondary | ICD-10-CM | POA: Diagnosis present

## 2022-03-20 DIAGNOSIS — K219 Gastro-esophageal reflux disease without esophagitis: Secondary | ICD-10-CM | POA: Insufficient documentation

## 2022-03-20 DIAGNOSIS — R110 Nausea: Secondary | ICD-10-CM | POA: Insufficient documentation

## 2022-03-20 DIAGNOSIS — K2210 Ulcer of esophagus without bleeding: Secondary | ICD-10-CM | POA: Diagnosis present

## 2022-03-20 DIAGNOSIS — Z9889 Other specified postprocedural states: Secondary | ICD-10-CM | POA: Insufficient documentation

## 2022-03-22 ENCOUNTER — Encounter (INDEPENDENT_AMBULATORY_CARE_PROVIDER_SITE_OTHER): Payer: Self-pay | Admitting: Gastroenterology

## 2022-03-24 ENCOUNTER — Ambulatory Visit: Payer: Self-pay | Admitting: Surgery

## 2022-04-01 ENCOUNTER — Encounter (HOSPITAL_COMMUNITY)
Admission: RE | Admit: 2022-04-01 | Discharge: 2022-04-01 | Disposition: A | Discharge status: Home / Self Care | Payer: Medicare Other | Source: Ambulatory Visit | Attending: Surgery | Admitting: Surgery

## 2022-04-01 DIAGNOSIS — R1310 Dysphagia, unspecified: Secondary | ICD-10-CM | POA: Diagnosis present

## 2022-04-01 DIAGNOSIS — K449 Diaphragmatic hernia without obstruction or gangrene: Secondary | ICD-10-CM | POA: Insufficient documentation

## 2022-04-01 DIAGNOSIS — K2210 Ulcer of esophagus without bleeding: Secondary | ICD-10-CM | POA: Insufficient documentation

## 2022-04-01 DIAGNOSIS — Z9889 Other specified postprocedural states: Secondary | ICD-10-CM | POA: Diagnosis present

## 2022-04-01 DIAGNOSIS — K219 Gastro-esophageal reflux disease without esophagitis: Secondary | ICD-10-CM | POA: Diagnosis present

## 2022-04-01 DIAGNOSIS — R110 Nausea: Secondary | ICD-10-CM | POA: Diagnosis present

## 2022-04-01 MED ORDER — TECHNETIUM TC 99M SULFUR COLLOID
2.0000 | Freq: Once | INTRAVENOUS | Status: AC | PRN
  Administered 2022-04-01: 2 via ORAL

## 2022-04-21 NOTE — Patient Instructions (Signed)
SURGICAL WAITING ROOM VISITATION Patients having surgery or a procedure may have no more than 2 support people in the waiting area - these visitors may rotate.   Children under the age of 80 must have an adult with them who is not the patient. If the patient needs to stay at the hospital during part of their recovery, the visitor guidelines for inpatient rooms apply. Pre-op nurse will coordinate an appropriate time for 1 support person to accompany patient in pre-op.  This support person may not rotate.    Please refer to the [facility] website for the visitor guidelines for Inpatients (after your surgery is over and you are in a regular room).       Your procedure is scheduled on:  05/09/22    Report to [facility] Main Entrance    Report to admitting at   0730AM   Call this number if you have problems the morning of surgery [phone]   Do not eat food :After Midnight.   After Midnight you may have the following liquids until __ 0630____ AM  DAY OF SURGERY  Water Non-Citrus Juices (without pulp, NO RED) Carbonated Beverages Black Coffee (NO MILK/CREAM OR CREAMERS, sugar ok)  Clear Tea (NO MILK/CREAM OR CREAMERS, sugar ok) regular and decaf                             Plain Jell-O (NO RED)                                           Fruit ices (not with fruit pulp, NO RED)                                     Popsicles (NO RED)                                                               Sports drinks like Gatorade (NO RED)              Drink 2 Ensure/G2 drinks AT 10:00 PM the night before surgery.        The day of surgery:  Drink ONE (1) Pre-Surgery Clear Ensure or G2 at  0630 AM  ( have completed by ) the morning of surgery. Drink in one sitting. Do not sip.  This drink was given to you during your hospital  pre-op appointment visit. Nothing else to drink after completing the  Pre-Surgery Clear Ensure or G2.          If you have questions, please contact your  surgeon's office.    Oral Hygiene is also important to reduce your risk of infection.                                    Remember - BRUSH YOUR TEETH THE MORNING OF SURGERY WITH YOUR REGULAR TOOTHPASTE  DENTURES WILL BE REMOVED PRIOR TO SURGERY PLEASE DO NOT APPLY "Poly grip" OR ADHESIVES!!!  Do NOT smoke after Midnight   Take these medicines the morning of surgery with A SIP OF WATER:  wellbutrin, lexapro, flonase, hydralazine, xyzal, toprol   DO NOT TAKE ANY ORAL DIABETIC MEDICATIONS DAY OF YOUR SURGERY  Bring CPAP mask and tubing day of surgery.                              You may not have any metal on your body including hair pins, jewelry, and body piercing             Do not wear make-up, lotions, powders, perfumes/cologne, or deodorant  Do not wear nail polish including gel and S&S, artificial/acrylic nails, or any other type of covering on natural nails including finger and toenails. If you have artificial nails, gel coating, etc. that needs to be removed by a nail salon please have this removed prior to surgery or surgery may need to be canceled/ delayed if the surgeon/ anesthesia feels like they are unable to be safely monitored.   Do not shave  48 hours prior to surgery.               Men may shave face and neck.   Do not bring valuables to the hospital. [place].   Contacts, glasses, dentures or bridgework may not be worn into surgery.   Bring small overnight bag day of surgery.   DO NOT [place]. PHARMACY WILL DISPENSE MEDICATIONS LISTED ON YOUR MEDICATION LIST TO YOU DURING YOUR ADMISSION [place]!    Patients discharged on the day of surgery will not be allowed to drive home.  Someone NEEDS to stay with you for the first 24 hours after anesthesia.   Special Instructions: Bring a copy of your healthcare power of attorney and living will documents the day of surgery if you  haven't scanned them before.              Please read over the following fact sheets you were given: IF [place] [phone]   If you received a COVID test during your pre-op visit  it is requested that you wear a mask when out in public, stay away from anyone that may not be feeling well and notify your surgeon if you develop symptoms. If you test positive for Covid or have been in contact with anyone that has tested positive in the last 10 days please notify you surgeon.    [place] - Preparing for Surgery Before surgery, you can play an important role.  Because skin is not sterile, your skin needs to be as free of germs as possible.  You can reduce the number of germs on your skin by washing with CHG (chlorahexidine gluconate) soap before surgery.  CHG is an antiseptic cleaner which kills germs and bonds with the skin to continue killing germs even after washing. Please DO NOT use if you have an allergy to CHG or antibacterial soaps.  If your skin becomes reddened/irritated stop using the CHG and inform your nurse when you arrive at Short Stay. Do not shave (including legs and underarms) for at least 48 hours prior to the first CHG shower.  You may shave your face/neck. Please follow these instructions carefully:  1.  Shower with  CHG Soap the night before surgery and the  morning of Surgery.  2.  If you choose to wash your hair, wash your hair first as usual with your  normal  shampoo.  3.  After you shampoo, rinse your hair and body thoroughly to remove the  shampoo.                           4.  Use CHG as you would any other liquid soap.  You can apply chg directly  to the skin and wash                       Gently with a scrungie or clean washcloth.  5.  Apply the CHG Soap to your body ONLY FROM THE NECK DOWN.   Do not use on face/ open                           Wound or open sores. Avoid contact with eyes, ears mouth and genitals  (private parts).                       Wash face,  Genitals (private parts) with your normal soap.             6.  Wash thoroughly, paying special attention to the area where your surgery  will be performed.  7.  Thoroughly rinse your body with warm water from the neck down.  8.  DO NOT shower/wash with your normal soap after using and rinsing off  the CHG Soap.                9.  Pat yourself dry with a clean towel.            10.  Wear clean pajamas.            11.  Place clean sheets on your bed the night of your first shower and do not  sleep with pets. Day of Surgery : Do not apply any lotions/deodorants the morning of surgery.  Please wear clean clothes to the hospital/surgery center.  FAILURE TO FOLLOW THESE INSTRUCTIONS MAY RESULT IN THE CANCELLATION OF YOUR SURGERY PATIENT SIGNATURE_________________________________  NURSE SIGNATURE__________________________________  ________________________________________________________________________

## 2022-04-21 NOTE — Progress Notes (Signed)
Anesthesia Review:  PCP: Cardiologist : Chest x-ray : EKG : 4/`13/23  Echo : 08/31/21   Stress test: 12/11/21  Cardiac Cath :  Activity level:  Sleep Study/ CPAP  s;ee[ Study- 01/17/22  Fasting Blood Sugar :      / Checks Blood Sugar -- times a day:   Blood Thinner/ Instructions /Last Dose: ASA / Instructions/ Last Dose :

## 2022-04-22 ENCOUNTER — Encounter (HOSPITAL_COMMUNITY)
Admission: RE | Admit: 2022-04-22 | Discharge: 2022-04-22 | Disposition: A | Discharge status: Home / Self Care | Payer: Medicare Other | Source: Ambulatory Visit | Attending: Family Medicine | Admitting: Family Medicine

## 2022-04-23 NOTE — Patient Instructions (Addendum)
DUE TO COVID-19 ONLY TWO VISITORS  (aged 74 and older)  ARE ALLOWED TO COME WITH YOU AND STAY IN THE WAITING ROOM ONLY DURING PRE OP AND PROCEDURE.   **NO VISITORS ARE ALLOWED IN THE SHORT STAY AREA OR RECOVERY ROOM!!**  IF YOU WILL BE ADMITTED INTO THE HOSPITAL YOU ARE ALLOWED ONLY FOUR SUPPORT PEOPLE DURING VISITATION HOURS ONLY (7 AM -8PM)   The support person(s) must pass our screening, gel in and out, and wear a mask at all times, including in the patient's room. Patients must also wear a mask when staff or their support person are in the room. Visitors GUEST BADGE MUST BE WORN VISIBLY  One adult visitor may remain with you overnight and MUST be in the room by 8 P.M.     Your procedure is scheduled on: 05/09/22   Report to [facility] Main Entrance    Report to admitting at  6:30 AM   Call this number if you have problems the morning of surgery [phone]   Do not eat food :After Midnight.   After Midnight you may have the following liquids until __5:45____ AM/  DAY OF SURGERY  Water Black Coffee (sugar ok, NO MILK/CREAM OR CREAMERS)  Tea (sugar ok, NO MILK/CREAM OR CREAMERS) regular and decaf                             Plain Jell-O (NO RED)                                           Fruit ices (not with fruit pulp, NO RED)                                     Popsicles (NO RED)                                                                  Juice: apple, WHITE grape, WHITE cranberry Sports drinks like Gatorade (NO RED)              Drink 2 Ensure/G2 drinks AT 10:00 PM the night before surgery.        The day of surgery:  Drink ONE (1) Pre-Surgery Clear Ensure  at  5:30 AM the morning of surgery. Drink in one sitting. Do not sip.  This drink was given to you during your hospital  pre-op appointment visit. Nothing else to drink after completing the  Pre-Surgery Clear Ensure at 5:45          If you have questions, please contact your surgeon's  office.   FOLLOW BOWEL PREP AND ANY ADDITIONAL PRE OP INSTRUCTIONS YOU RECEIVED FROM YOUR SURGEON'S OFFICE!!!     Oral Hygiene is also important to reduce your risk of infection.                                    Remember - BRUSH YOUR TEETH THE MORNING OF SURGERY  WITH YOUR REGULAR TOOTHPASTE  DENTURES WILL BE REMOVED PRIOR TO SURGERY PLEASE DO NOT APPLY "Poly grip" OR ADHESIVES!!!    Take these medicines the morning of surgery with A SIP OF WATER: Bupropion-Wellbutrin                                                                                                                           Escitalopram- Lexapro                                                                                                                           Hydralazine                                                                                                                           Metoprolol                                                                                                                           Prednisone  Omeprazole                 Bring CPAP mask and tubing day of surgery.                              You may not have any metal on your body including  jewelry, and body piercing             Do not wear  lotions, powders, cologne, or deodorant               Men may shave face and neck.   Do not bring valuables to the hospital. [place].   Contacts, glasses, or bridgework may not be worn into surgery.   Bring small overnight bag day of surgery.   DO NOT [place].  Special Instructions: Bring a copy of your healthcare power of attorney and living will documents the day of surgery if you haven't scanned them before.              Please read over the following fact sheets you  were given: IF YOU HAVE QUESTIONS ABOUT YOUR PRE-OP INSTRUCTIONS PLEASE CALL [phone]    [facility] - Preparing for Surgery Before surgery, you can play an important role.  Because skin is not sterile, your skin needs to be as free of germs as possible.  You can reduce the number of germs on your skin by washing with CHG (chlorahexidine gluconate) soap before surgery.  CHG is an antiseptic cleaner which kills germs and bonds with the skin to continue killing germs even after washing. Please DO NOT use if you have an allergy to CHG or antibacterial soaps.  If your skin becomes reddened/irritated stop using the CHG and inform your nurse when you arrive at Short Stay.  You may shave your face/neck. Please follow these instructions carefully:  1.  Shower with CHG Soap the night before surgery and the  morning of Surgery.  2.  If you choose to wash your hair, wash your hair first as usual with your  normal  shampoo.  3.  After you shampoo, rinse your hair and body thoroughly to remove the  shampoo.                            4.  Use CHG as you would any other liquid soap.  You can apply chg directly  to the skin and wash                       Gently with a scrungie or clean washcloth.  5.  Apply the CHG Soap to your body ONLY FROM THE NECK DOWN.   Do not use on face/ open                           Wound or open sores. Avoid contact with eyes, ears mouth and genitals (private parts).                       Wash face,  Genitals (private parts) with your normal soap.             6.  Wash thoroughly, paying  special attention to the area where your surgery  will be performed.  7.  Thoroughly rinse your body with warm water from the neck down.  8.  DO NOT shower/wash with your normal soap after using and rinsing off  the CHG Soap.                9.  Pat yourself dry with a clean towel.            10.  Wear clean pajamas.            11.  Place clean sheets on your bed the night of your first shower and do  not  sleep with pets. Day of Surgery : Do not apply any lotions/deodorants the morning of surgery.  Please wear clean clothes to the hospital/surgery center.  FAILURE TO FOLLOW THESE INSTRUCTIONS MAY RESULT IN THE CANCELLATION OF YOUR SURGERY     ________________________________________________________________________

## 2022-04-24 ENCOUNTER — Encounter (HOSPITAL_COMMUNITY): Payer: Self-pay

## 2022-04-24 ENCOUNTER — Other Ambulatory Visit: Payer: Self-pay

## 2022-04-24 ENCOUNTER — Encounter (HOSPITAL_COMMUNITY)
Admission: RE | Admit: 2022-04-24 | Discharge: 2022-04-24 | Disposition: A | Discharge status: Home / Self Care | Payer: Medicare Other | Source: Ambulatory Visit | Attending: Surgery | Admitting: Surgery

## 2022-04-24 ENCOUNTER — Encounter: Payer: Self-pay | Admitting: Surgery

## 2022-04-24 DIAGNOSIS — F419 Anxiety disorder, unspecified: Secondary | ICD-10-CM | POA: Insufficient documentation

## 2022-04-24 DIAGNOSIS — F4310 Post-traumatic stress disorder, unspecified: Secondary | ICD-10-CM | POA: Insufficient documentation

## 2022-04-24 DIAGNOSIS — M329 Systemic lupus erythematosus, unspecified: Secondary | ICD-10-CM | POA: Insufficient documentation

## 2022-04-24 DIAGNOSIS — Z01812 Encounter for preprocedural laboratory examination: Secondary | ICD-10-CM | POA: Diagnosis present

## 2022-04-24 DIAGNOSIS — N183 Chronic kidney disease, stage 3 unspecified: Secondary | ICD-10-CM | POA: Insufficient documentation

## 2022-04-24 DIAGNOSIS — I10 Essential (primary) hypertension: Secondary | ICD-10-CM | POA: Diagnosis not present

## 2022-04-24 DIAGNOSIS — F32A Depression, unspecified: Secondary | ICD-10-CM | POA: Insufficient documentation

## 2022-04-24 DIAGNOSIS — M069 Rheumatoid arthritis, unspecified: Secondary | ICD-10-CM | POA: Insufficient documentation

## 2022-04-24 HISTORY — DX: Dyspnea, unspecified: R06.00

## 2022-04-24 LAB — CBC
HCT: 44.3 % (ref 39.0–52.0)
Hemoglobin: 13.8 g/dL (ref 13.0–17.0)
MCH: 27 pg (ref 26.0–34.0)
MCHC: 31.2 g/dL (ref 30.0–36.0)
MCV: 86.7 fL (ref 80.0–100.0)
Platelets: 343 10*3/uL (ref 150–400)
RBC: 5.11 MIL/uL (ref 4.22–5.81)
RDW: 15 % (ref 11.5–15.5)
WBC: 9.4 10*3/uL (ref 4.0–10.5)
nRBC: 0 % (ref 0.0–0.2)

## 2022-04-24 LAB — BASIC METABOLIC PANEL
Anion gap: 9 (ref 5–15)
BUN: 17 mg/dL (ref 8–23)
CO2: 23 mmol/L (ref 22–32)
Calcium: 9.1 mg/dL (ref 8.9–10.3)
Chloride: 107 mmol/L (ref 98–111)
Creatinine, Ser: 1.32 mg/dL — ABNORMAL HIGH (ref 0.61–1.24)
GFR, Estimated: 57 mL/min — ABNORMAL LOW (ref >60)
Glucose, Bld: 88 mg/dL (ref 70–99)
Potassium: 3.8 mmol/L (ref 3.5–5.1)
Sodium: 139 mmol/L (ref 135–145)

## 2022-04-24 NOTE — Progress Notes (Signed)
Anesthesia note:  Bowel prep reminder: Pt will look for papers from Dr. Clyda Greener office   PCP - Dr. Ledell Peoples Cardiologist -Dr. Donn Pierini Other- Pulmonolgist- Dr. Emogene Morgan  Chest x-ray - no EKG - 08/22/21 Stress Test - no ECHO - 08/31/21 Cardiac Cath - no CABG-no Pacemaker/ICD device last checked:NA  Sleep Study - 01/17/22 Pt reports it is mild and C-PAP is optional CPAP - no  Pt is pre diabetic-no CBG at PAT visit- Fasting Blood Sugar at home- Checks Blood Sugar _____  Blood Thinner:no Blood Thinner Instructions: Aspirin Instructions: Last Dose:  Anesthesia review: Yes  reason:SOB with exertion  Patient denies shortness of breath, fever, cough and chest pain at PAT appointment. Pt reports that he is SOB with exertion. He is out of shape and hiatal hernia makes him SOB. O2 sat was 95%   Patient verbalized understanding of instructions that were given to them at the PAT appointment. Patient was also instructed that they will need to review over the PAT instructions again at home before surgery.yes. his Son was with him at [facility] visit

## 2022-05-08 NOTE — Anesthesia Preprocedure Evaluation (Addendum)
Anesthesia Evaluation  Patient identified by MRN, date of birth, ID band Patient awake    Reviewed: Allergy & Precautions, H&P , NPO status , Patient's Chart, lab work & pertinent test results  Airway Mallampati: I  TM Distance: >3 FB Neck ROM: Full    Dental no notable dental hx. (+) Teeth Intact, Dental Advisory Given, Caps, Poor Dentition   Pulmonary shortness of breath, former smoker Cardiopulmonary Exercise test 8/23 Conclusion: Exercise testing with gas exchange demonstrates normal functional capacity when compared to matched sedentary norms. There is no clear cardiopulmonary abnormality. Patient appears primarily limited due to body habitus. VE/VCO2 slope is moderately elevated and could indicate increased pulmonary pressures, however with low PETCO2, this could more like be associated with hyperventilation at peak exercise.   Pulmonary exam normal breath sounds clear to auscultation       Cardiovascular Exercise Tolerance: Good hypertension, Pt. on medications Normal cardiovascular exam+ Valvular Problems/Murmurs  Rhythm:Regular Rate:Normal  ECHO 4/22 EF 65% grade 1 dd   Neuro/Psych  PSYCHIATRIC DISORDERS Anxiety Depression     Neuromuscular disease negative neurological ROS  negative psych ROS   GI/Hepatic negative GI ROS, Neg liver ROS, hiatal hernia, PUD,GERD  Medicated and Poorly Controlled,,  Endo/Other  negative endocrine ROS  Morbid obesity  Renal/GU CRFRenal diseasenegative Renal ROS  negative genitourinary   Musculoskeletal negative musculoskeletal ROS (+) Arthritis , Rheumatoid disorders,    Abdominal   Peds negative pediatric ROS (+)  Hematology negative hematology ROS (+)   Anesthesia Other Findings   Reproductive/Obstetrics negative OB ROS                              Anesthesia Physical Anesthesia Plan  ASA: 4  Anesthesia Plan: General   Post-op Pain Management:  Minimal or no pain anticipated, Ofirmev IV (intra-op)* and Dilaudid IV   Induction: Intravenous  PONV Risk Score and Plan: 2 and Ondansetron, Dexamethasone and Treatment may vary due to age or medical condition  Airway Management Planned: Oral ETT  Additional Equipment: Arterial line  Intra-op Plan:   Post-operative Plan: Possible Post-op intubation/ventilation  Informed Consent: I have reviewed the patients History and Physical, chart, labs and discussed the procedure including the risks, benefits and alternatives for the proposed anesthesia with the patient or authorized representative who has indicated his/her understanding and acceptance.       Plan Discussed with: Anesthesiologist and CRNA  Anesthesia Plan Comments: (  )       Anesthesia Quick Evaluation

## 2022-05-09 ENCOUNTER — Encounter (HOSPITAL_COMMUNITY): Payer: Self-pay | Admitting: Surgery

## 2022-05-09 ENCOUNTER — Other Ambulatory Visit: Payer: Self-pay

## 2022-05-09 ENCOUNTER — Ambulatory Visit (HOSPITAL_COMMUNITY): Payer: Medicare Other | Admitting: Physician Assistant

## 2022-05-09 ENCOUNTER — Encounter (HOSPITAL_COMMUNITY)
Admission: RE | Disposition: A | Discharge status: Home / Self Care | Payer: Self-pay | Source: Ambulatory Visit | Attending: Surgery

## 2022-05-09 ENCOUNTER — Inpatient Hospital Stay (HOSPITAL_COMMUNITY)
Admission: RE | Admit: 2022-05-09 | Discharge: 2022-05-12 | DRG: 327 | Disposition: A | Discharge status: Home / Self Care | Payer: Medicare Other | Source: Ambulatory Visit | Attending: Surgery | Admitting: Surgery

## 2022-05-09 DIAGNOSIS — M069 Rheumatoid arthritis, unspecified: Secondary | ICD-10-CM | POA: Diagnosis present

## 2022-05-09 DIAGNOSIS — Z96652 Presence of left artificial knee joint: Secondary | ICD-10-CM | POA: Diagnosis present

## 2022-05-09 DIAGNOSIS — M1990 Unspecified osteoarthritis, unspecified site: Secondary | ICD-10-CM

## 2022-05-09 DIAGNOSIS — M329 Systemic lupus erythematosus, unspecified: Secondary | ICD-10-CM | POA: Diagnosis present

## 2022-05-09 DIAGNOSIS — F4310 Post-traumatic stress disorder, unspecified: Secondary | ICD-10-CM | POA: Diagnosis present

## 2022-05-09 DIAGNOSIS — G4710 Hypersomnia, unspecified: Secondary | ICD-10-CM | POA: Diagnosis present

## 2022-05-09 DIAGNOSIS — Z8249 Family history of ischemic heart disease and other diseases of the circulatory system: Secondary | ICD-10-CM | POA: Diagnosis not present

## 2022-05-09 DIAGNOSIS — K429 Umbilical hernia without obstruction or gangrene: Secondary | ICD-10-CM

## 2022-05-09 DIAGNOSIS — R1310 Dysphagia, unspecified: Secondary | ICD-10-CM | POA: Diagnosis present

## 2022-05-09 DIAGNOSIS — N183 Chronic kidney disease, stage 3 unspecified: Secondary | ICD-10-CM | POA: Diagnosis present

## 2022-05-09 DIAGNOSIS — K440 Diaphragmatic hernia with obstruction, without gangrene: Secondary | ICD-10-CM | POA: Diagnosis present

## 2022-05-09 DIAGNOSIS — D849 Immunodeficiency, unspecified: Secondary | ICD-10-CM | POA: Diagnosis present

## 2022-05-09 DIAGNOSIS — Z85828 Personal history of other malignant neoplasm of skin: Secondary | ICD-10-CM | POA: Diagnosis not present

## 2022-05-09 DIAGNOSIS — I129 Hypertensive chronic kidney disease with stage 1 through stage 4 chronic kidney disease, or unspecified chronic kidney disease: Secondary | ICD-10-CM | POA: Diagnosis present

## 2022-05-09 DIAGNOSIS — E669 Obesity, unspecified: Secondary | ICD-10-CM | POA: Diagnosis present

## 2022-05-09 DIAGNOSIS — Z882 Allergy status to sulfonamides status: Secondary | ICD-10-CM

## 2022-05-09 DIAGNOSIS — G4730 Sleep apnea, unspecified: Secondary | ICD-10-CM | POA: Diagnosis present

## 2022-05-09 DIAGNOSIS — Z7952 Long term (current) use of systemic steroids: Secondary | ICD-10-CM

## 2022-05-09 DIAGNOSIS — K449 Diaphragmatic hernia without obstruction or gangrene: Secondary | ICD-10-CM | POA: Diagnosis not present

## 2022-05-09 DIAGNOSIS — K5180 Other ulcerative colitis without complications: Secondary | ICD-10-CM | POA: Diagnosis present

## 2022-05-09 DIAGNOSIS — Z79899 Other long term (current) drug therapy: Secondary | ICD-10-CM | POA: Diagnosis not present

## 2022-05-09 DIAGNOSIS — F418 Other specified anxiety disorders: Secondary | ICD-10-CM | POA: Diagnosis not present

## 2022-05-09 DIAGNOSIS — Z6835 Body mass index (BMI) 35.0-35.9, adult: Secondary | ICD-10-CM

## 2022-05-09 DIAGNOSIS — Z96642 Presence of left artificial hip joint: Secondary | ICD-10-CM | POA: Diagnosis present

## 2022-05-09 DIAGNOSIS — Z888 Allergy status to other drugs, medicaments and biological substances status: Secondary | ICD-10-CM | POA: Diagnosis not present

## 2022-05-09 DIAGNOSIS — Z8052 Family history of malignant neoplasm of bladder: Secondary | ICD-10-CM

## 2022-05-09 DIAGNOSIS — Z806 Family history of leukemia: Secondary | ICD-10-CM | POA: Diagnosis not present

## 2022-05-09 DIAGNOSIS — Z87891 Personal history of nicotine dependence: Secondary | ICD-10-CM

## 2022-05-09 DIAGNOSIS — F32A Depression, unspecified: Secondary | ICD-10-CM | POA: Diagnosis present

## 2022-05-09 DIAGNOSIS — K2100 Gastro-esophageal reflux disease with esophagitis, without bleeding: Secondary | ICD-10-CM | POA: Diagnosis present

## 2022-05-09 DIAGNOSIS — I10 Essential (primary) hypertension: Principal | ICD-10-CM

## 2022-05-09 HISTORY — PX: UMBILICAL HERNIA REPAIR: SHX196

## 2022-05-09 HISTORY — PX: XI ROBOTIC ASSISTED PARAESOPHAGEAL HERNIA REPAIR: SHX6871

## 2022-05-09 LAB — POCT I-STAT 7, (LYTES, BLD GAS, ICA,H+H)
Acid-base deficit: 5 mmol/L — ABNORMAL HIGH (ref 0.0–2.0)
Bicarbonate: 22.7 mmol/L (ref 20.0–28.0)
Calcium, Ion: 1.22 mmol/L (ref 1.15–1.40)
HCT: 41 % (ref 39.0–52.0)
Hemoglobin: 13.9 g/dL (ref 13.0–17.0)
O2 Saturation: 98 %
Potassium: 4.6 mmol/L (ref 3.5–5.1)
Sodium: 137 mmol/L (ref 135–145)
TCO2: 24 mmol/L (ref 22–32)
pCO2 arterial: 52.2 mmHg — ABNORMAL HIGH (ref 32–48)
pH, Arterial: 7.247 — ABNORMAL LOW (ref 7.35–7.45)
pO2, Arterial: 117 mmHg — ABNORMAL HIGH (ref 83–108)

## 2022-05-09 SURGERY — REPAIR, HERNIA, PARAESOPHAGEAL, ROBOT-ASSISTED
Anesthesia: General | Site: Abdomen

## 2022-05-09 MED ORDER — PROPOFOL 10 MG/ML IV BOLUS
INTRAVENOUS | Status: AC
  Filled 2022-05-09: qty 20

## 2022-05-09 MED ORDER — ROCURONIUM BROMIDE 10 MG/ML (PF) SYRINGE
PREFILLED_SYRINGE | INTRAVENOUS | Status: DC | PRN
  Administered 2022-05-09 (×2): 20 mg via INTRAVENOUS
  Administered 2022-05-09: 80 mg via INTRAVENOUS

## 2022-05-09 MED ORDER — MEPERIDINE HCL 50 MG/ML IJ SOLN: 6.2500 mg | INTRAMUSCULAR | Status: DC | PRN

## 2022-05-09 MED ORDER — CHLORHEXIDINE GLUCONATE CLOTH 2 % EX PADS: 6.0000 | MEDICATED_PAD | Freq: Once | CUTANEOUS | Status: DC

## 2022-05-09 MED ORDER — BUPIVACAINE LIPOSOME 1.3 % IJ SUSP: 20.0000 mL | Freq: Once | INTRAMUSCULAR | Status: DC

## 2022-05-09 MED ORDER — DIPHENHYDRAMINE HCL 12.5 MG/5ML PO ELIX: 12.5000 mg | ORAL_SOLUTION | Freq: Four times a day (QID) | ORAL | Status: DC | PRN

## 2022-05-09 MED ORDER — HYDROXYCHLOROQUINE SULFATE 200 MG PO TABS
200.0000 mg | ORAL_TABLET | Freq: Two times a day (BID) | ORAL | Status: DC
  Administered 2022-05-09 – 2022-05-12 (×6): 200 mg via ORAL
  Filled 2022-05-09 (×6): qty 1

## 2022-05-09 MED ORDER — 0.9 % SODIUM CHLORIDE (POUR BTL) OPTIME
TOPICAL | Status: DC | PRN
  Administered 2022-05-09: 1000 mL

## 2022-05-09 MED ORDER — ZINC GLUCONATE 50 MG PO TABS: 50.0000 mg | ORAL_TABLET | Freq: Every morning | ORAL | Status: DC

## 2022-05-09 MED ORDER — ACETAMINOPHEN 160 MG/5ML PO SOLN: 325.0000 mg | ORAL | Status: DC | PRN

## 2022-05-09 MED ORDER — OXYCODONE HCL 5 MG PO TABS: 5.0000 mg | ORAL_TABLET | Freq: Once | ORAL | Status: DC | PRN

## 2022-05-09 MED ORDER — SIMETHICONE 80 MG PO CHEW: 40.0000 mg | CHEWABLE_TABLET | Freq: Four times a day (QID) | ORAL | Status: DC | PRN

## 2022-05-09 MED ORDER — GABAPENTIN 300 MG PO CAPS
300.0000 mg | ORAL_CAPSULE | ORAL | Status: AC
  Administered 2022-05-09: 300 mg via ORAL
  Filled 2022-05-09: qty 1

## 2022-05-09 MED ORDER — BUPIVACAINE LIPOSOME 1.3 % IJ SUSP
INTRAMUSCULAR | Status: AC
  Filled 2022-05-09: qty 20

## 2022-05-09 MED ORDER — MAGNESIUM HYDROXIDE 400 MG/5ML PO SUSP: 30.0000 mL | Freq: Every day | ORAL | Status: DC | PRN

## 2022-05-09 MED ORDER — SODIUM CHLORIDE 0.9 % IV SOLN: 250.0000 mL | INTRAVENOUS | Status: DC | PRN

## 2022-05-09 MED ORDER — METRONIDAZOLE 500 MG/100ML IV SOLN
500.0000 mg | INTRAVENOUS | Status: DC
  Filled 2022-05-09: qty 100

## 2022-05-09 MED ORDER — GABAPENTIN 100 MG PO CAPS
300.0000 mg | ORAL_CAPSULE | Freq: Two times a day (BID) | ORAL | Status: DC
  Administered 2022-05-09 – 2022-05-10 (×3): 300 mg via ORAL
  Filled 2022-05-09 (×4): qty 3

## 2022-05-09 MED ORDER — LIP MEDEX EX OINT
TOPICAL_OINTMENT | Freq: Two times a day (BID) | CUTANEOUS | Status: DC
  Administered 2022-05-09: 1 via TOPICAL
  Filled 2022-05-09: qty 7

## 2022-05-09 MED ORDER — CALCIUM POLYCARBOPHIL 625 MG PO TABS
625.0000 mg | ORAL_TABLET | Freq: Two times a day (BID) | ORAL | Status: DC
  Administered 2022-05-09 – 2022-05-12 (×6): 625 mg via ORAL
  Filled 2022-05-09 (×6): qty 1

## 2022-05-09 MED ORDER — ROCURONIUM BROMIDE 10 MG/ML (PF) SYRINGE
PREFILLED_SYRINGE | INTRAVENOUS | Status: AC
  Filled 2022-05-09: qty 20

## 2022-05-09 MED ORDER — LACTATED RINGERS IV SOLN: INTRAVENOUS | Status: DC

## 2022-05-09 MED ORDER — SODIUM CHLORIDE 0.9% FLUSH
3.0000 mL | Freq: Two times a day (BID) | INTRAVENOUS | Status: DC
  Administered 2022-05-09 – 2022-05-12 (×7): 3 mL via INTRAVENOUS

## 2022-05-09 MED ORDER — PROCHLORPERAZINE EDISYLATE 10 MG/2ML IJ SOLN: 5.0000 mg | Freq: Four times a day (QID) | INTRAMUSCULAR | Status: DC | PRN

## 2022-05-09 MED ORDER — ENSURE PRE-SURGERY PO LIQD
592.0000 mL | Freq: Once | ORAL | Status: DC
  Filled 2022-05-09: qty 592

## 2022-05-09 MED ORDER — LORAZEPAM 2 MG/ML IJ SOLN: 0.5000 mg | Freq: Three times a day (TID) | INTRAMUSCULAR | Status: DC | PRN

## 2022-05-09 MED ORDER — BUPIVACAINE-EPINEPHRINE (PF) 0.5% -1:200000 IJ SOLN
INTRAMUSCULAR | Status: AC
  Filled 2022-05-09: qty 30

## 2022-05-09 MED ORDER — ACETAMINOPHEN 500 MG PO TABS
1000.0000 mg | ORAL_TABLET | ORAL | Status: AC
  Administered 2022-05-09: 1000 mg via ORAL
  Filled 2022-05-09: qty 2

## 2022-05-09 MED ORDER — METHOCARBAMOL 1000 MG/10ML IJ SOLN
1000.0000 mg | Freq: Four times a day (QID) | INTRAVENOUS | Status: DC | PRN
  Filled 2022-05-09: qty 10

## 2022-05-09 MED ORDER — ORAL CARE MOUTH RINSE: 15.0000 mL | Freq: Once | OROMUCOSAL | Status: AC

## 2022-05-09 MED ORDER — MAGIC MOUTHWASH: 15.0000 mL | Freq: Four times a day (QID) | ORAL | Status: DC | PRN

## 2022-05-09 MED ORDER — FENTANYL CITRATE (PF) 100 MCG/2ML IJ SOLN
INTRAMUSCULAR | Status: AC
  Filled 2022-05-09: qty 2

## 2022-05-09 MED ORDER — MIDAZOLAM HCL 2 MG/2ML IJ SOLN
1.0000 mg | Freq: Once | INTRAMUSCULAR | Status: AC
  Administered 2022-05-09: 1 mg via INTRAVENOUS
  Filled 2022-05-09: qty 2

## 2022-05-09 MED ORDER — SODIUM CHLORIDE (PF) 0.9 % IJ SOLN
INTRAMUSCULAR | Status: AC
  Filled 2022-05-09: qty 30

## 2022-05-09 MED ORDER — METOPROLOL TARTRATE 5 MG/5ML IV SOLN
5.0000 mg | Freq: Four times a day (QID) | INTRAVENOUS | Status: DC | PRN
  Administered 2022-05-11: 5 mg via INTRAVENOUS
  Filled 2022-05-09: qty 5

## 2022-05-09 MED ORDER — BUPIVACAINE LIPOSOME 1.3 % IJ SUSP
INTRAMUSCULAR | Status: DC | PRN
  Administered 2022-05-09: 20 mL

## 2022-05-09 MED ORDER — OXYCODONE HCL 5 MG PO TABS
5.0000 mg | ORAL_TABLET | ORAL | Status: DC | PRN
  Administered 2022-05-09 – 2022-05-11 (×3): 10 mg via ORAL
  Filled 2022-05-09 (×3): qty 2

## 2022-05-09 MED ORDER — SUGAMMADEX SODIUM 200 MG/2ML IV SOLN
INTRAVENOUS | Status: DC | PRN
  Administered 2022-05-09: 200 mg via INTRAVENOUS

## 2022-05-09 MED ORDER — CYCLOBENZAPRINE HCL 10 MG PO TABS: 10.0000 mg | ORAL_TABLET | Freq: Three times a day (TID) | ORAL | Status: DC | PRN

## 2022-05-09 MED ORDER — PROCHLORPERAZINE MALEATE 10 MG PO TABS: 10.0000 mg | ORAL_TABLET | Freq: Four times a day (QID) | ORAL | Status: DC | PRN

## 2022-05-09 MED ORDER — ZINC SULFATE 220 (50 ZN) MG PO CAPS
220.0000 mg | ORAL_CAPSULE | Freq: Every day | ORAL | Status: DC
  Administered 2022-05-10 – 2022-05-12 (×3): 220 mg via ORAL
  Filled 2022-05-09 (×3): qty 1

## 2022-05-09 MED ORDER — PROPOFOL 10 MG/ML IV BOLUS
INTRAVENOUS | Status: DC | PRN
  Administered 2022-05-09: 140 mg via INTRAVENOUS

## 2022-05-09 MED ORDER — FENTANYL CITRATE PF 50 MCG/ML IJ SOSY: 25.0000 ug | PREFILLED_SYRINGE | INTRAMUSCULAR | Status: DC | PRN

## 2022-05-09 MED ORDER — ALBUMIN HUMAN 5 % IV SOLN: 12.5000 g | Freq: Four times a day (QID) | INTRAVENOUS | Status: DC | PRN

## 2022-05-09 MED ORDER — LORAZEPAM 1 MG PO TABS
2.0000 mg | ORAL_TABLET | Freq: Two times a day (BID) | ORAL | Status: DC
  Administered 2022-05-09 – 2022-05-12 (×6): 2 mg via ORAL
  Filled 2022-05-09 (×6): qty 2

## 2022-05-09 MED ORDER — ACETAMINOPHEN 500 MG PO TABS
1000.0000 mg | ORAL_TABLET | Freq: Four times a day (QID) | ORAL | Status: DC
  Administered 2022-05-09 – 2022-05-12 (×11): 1000 mg via ORAL
  Filled 2022-05-09 (×11): qty 2

## 2022-05-09 MED ORDER — PANTOPRAZOLE SODIUM 40 MG PO TBEC
40.0000 mg | DELAYED_RELEASE_TABLET | Freq: Two times a day (BID) | ORAL | Status: DC
  Administered 2022-05-09 – 2022-05-12 (×6): 40 mg via ORAL
  Filled 2022-05-09 (×6): qty 1

## 2022-05-09 MED ORDER — DEXAMETHASONE SODIUM PHOSPHATE 4 MG/ML IJ SOLN
4.0000 mg | Freq: Two times a day (BID) | INTRAMUSCULAR | Status: AC
  Administered 2022-05-09 – 2022-05-11 (×6): 4 mg via INTRAVENOUS
  Filled 2022-05-09 (×6): qty 1

## 2022-05-09 MED ORDER — DIPHENHYDRAMINE HCL 50 MG/ML IJ SOLN: 12.5000 mg | Freq: Four times a day (QID) | INTRAMUSCULAR | Status: DC | PRN

## 2022-05-09 MED ORDER — ENOXAPARIN SODIUM 40 MG/0.4ML IJ SOSY
40.0000 mg | PREFILLED_SYRINGE | INTRAMUSCULAR | Status: DC
  Administered 2022-05-10 – 2022-05-12 (×3): 40 mg via SUBCUTANEOUS
  Filled 2022-05-09 (×3): qty 0.4

## 2022-05-09 MED ORDER — ALBUTEROL SULFATE (2.5 MG/3ML) 0.083% IN NEBU: 2.5000 mg | INHALATION_SOLUTION | Freq: Four times a day (QID) | RESPIRATORY_TRACT | Status: DC | PRN

## 2022-05-09 MED ORDER — LACTATED RINGERS IR SOLN
Status: DC | PRN
  Administered 2022-05-09: 1000 mL

## 2022-05-09 MED ORDER — ONDANSETRON HCL 4 MG/2ML IJ SOLN
INTRAMUSCULAR | Status: DC | PRN
  Administered 2022-05-09: 4 mg via INTRAVENOUS

## 2022-05-09 MED ORDER — METOPROLOL TARTRATE 12.5 MG HALF TABLET
12.5000 mg | ORAL_TABLET | Freq: Two times a day (BID) | ORAL | Status: DC
  Administered 2022-05-09 – 2022-05-12 (×7): 12.5 mg via ORAL
  Filled 2022-05-09 (×7): qty 1

## 2022-05-09 MED ORDER — LACTATED RINGERS IV SOLN: INTRAVENOUS | Status: AC

## 2022-05-09 MED ORDER — EPHEDRINE 5 MG/ML INJ
INTRAVENOUS | Status: AC
  Filled 2022-05-09: qty 5

## 2022-05-09 MED ORDER — LIDOCAINE 2% (20 MG/ML) 5 ML SYRINGE
INTRAMUSCULAR | Status: DC | PRN
  Administered 2022-05-09: 1.5 mg/kg/h via INTRAVENOUS
  Administered 2022-05-09: 80 mg via INTRAVENOUS

## 2022-05-09 MED ORDER — BUPIVACAINE-EPINEPHRINE 0.5% -1:200000 IJ SOLN
INTRAMUSCULAR | Status: DC | PRN
  Administered 2022-05-09: 30 mL

## 2022-05-09 MED ORDER — OXYCODONE HCL 5 MG/5ML PO SOLN: 5.0000 mg | Freq: Once | ORAL | Status: DC | PRN

## 2022-05-09 MED ORDER — CHLORHEXIDINE GLUCONATE 0.12 % MT SOLN
15.0000 mL | Freq: Once | OROMUCOSAL | Status: AC
  Administered 2022-05-09: 15 mL via OROMUCOSAL

## 2022-05-09 MED ORDER — ONDANSETRON HCL 4 MG/2ML IJ SOLN
4.0000 mg | Freq: Four times a day (QID) | INTRAMUSCULAR | Status: DC | PRN
  Administered 2022-05-11: 4 mg via INTRAVENOUS
  Filled 2022-05-09: qty 2

## 2022-05-09 MED ORDER — ONDANSETRON HCL 4 MG/2ML IJ SOLN: 4.0000 mg | Freq: Once | INTRAMUSCULAR | Status: DC | PRN

## 2022-05-09 MED ORDER — DEXAMETHASONE SODIUM PHOSPHATE 10 MG/ML IJ SOLN
INTRAMUSCULAR | Status: DC | PRN
  Administered 2022-05-09: 10 mg via INTRAVENOUS

## 2022-05-09 MED ORDER — BISACODYL 10 MG RE SUPP: 10.0000 mg | Freq: Two times a day (BID) | RECTAL | Status: DC | PRN

## 2022-05-09 MED ORDER — SODIUM CHLORIDE 0.9 % IV SOLN
2.0000 g | INTRAVENOUS | Status: DC
  Filled 2022-05-09: qty 20

## 2022-05-09 MED ORDER — FLUTICASONE PROPIONATE 50 MCG/ACT NA SUSP
1.0000 | Freq: Every day | NASAL | Status: DC
  Administered 2022-05-09: 1 via NASAL
  Filled 2022-05-09: qty 16

## 2022-05-09 MED ORDER — ONDANSETRON HCL 4 MG PO TABS: 4.0000 mg | ORAL_TABLET | Freq: Three times a day (TID) | ORAL | 10 refills | Status: DC | PRN

## 2022-05-09 MED ORDER — SCOPOLAMINE 1 MG/3DAYS TD PT72
1.0000 | MEDICATED_PATCH | TRANSDERMAL | Status: DC
  Administered 2022-05-09: 1.5 mg via TRANSDERMAL
  Filled 2022-05-09: qty 1

## 2022-05-09 MED ORDER — ENALAPRILAT 1.25 MG/ML IV SOLN
0.6250 mg | Freq: Four times a day (QID) | INTRAVENOUS | Status: DC | PRN
  Administered 2022-05-11: 0.625 mg via INTRAVENOUS
  Filled 2022-05-09 (×2): qty 1

## 2022-05-09 MED ORDER — ESCITALOPRAM OXALATE 20 MG PO TABS
20.0000 mg | ORAL_TABLET | Freq: Every day | ORAL | Status: DC
  Administered 2022-05-10 – 2022-05-12 (×3): 20 mg via ORAL
  Filled 2022-05-09 (×3): qty 1

## 2022-05-09 MED ORDER — SODIUM CHLORIDE (PF) 0.9 % IJ SOLN
INTRAMUSCULAR | Status: DC | PRN
  Administered 2022-05-09: 30 mL via INTRAVENOUS

## 2022-05-09 MED ORDER — BUPROPION HCL ER (XL) 150 MG PO TB24
300.0000 mg | ORAL_TABLET | Freq: Every day | ORAL | Status: DC
  Administered 2022-05-10 – 2022-05-12 (×3): 300 mg via ORAL
  Filled 2022-05-09 (×3): qty 2

## 2022-05-09 MED ORDER — TRAMADOL HCL 50 MG PO TABS: 50.0000 mg | ORAL_TABLET | Freq: Four times a day (QID) | ORAL | 0 refills | Status: DC | PRN

## 2022-05-09 MED ORDER — ENSURE PRE-SURGERY PO LIQD
296.0000 mL | Freq: Once | ORAL | Status: DC
  Filled 2022-05-09: qty 296

## 2022-05-09 MED ORDER — PHENYLEPHRINE 80 MCG/ML (10ML) SYRINGE FOR IV PUSH (FOR BLOOD PRESSURE SUPPORT)
PREFILLED_SYRINGE | INTRAVENOUS | Status: DC | PRN
  Administered 2022-05-09: 160 ug via INTRAVENOUS
  Administered 2022-05-09 (×2): 80 ug via INTRAVENOUS
  Administered 2022-05-09 (×3): 160 ug via INTRAVENOUS

## 2022-05-09 MED ORDER — EPHEDRINE SULFATE-NACL 50-0.9 MG/10ML-% IV SOSY
PREFILLED_SYRINGE | INTRAVENOUS | Status: DC | PRN
  Administered 2022-05-09: 5 mg via INTRAVENOUS

## 2022-05-09 MED ORDER — FENTANYL CITRATE (PF) 100 MCG/2ML IJ SOLN
INTRAMUSCULAR | Status: DC | PRN
  Administered 2022-05-09 (×2): 50 ug via INTRAVENOUS

## 2022-05-09 MED ORDER — LACTATED RINGERS IV SOLN: INTRAVENOUS | Status: DC | PRN

## 2022-05-09 MED ORDER — TRAMADOL HCL 50 MG PO TABS: 50.0000 mg | ORAL_TABLET | Freq: Four times a day (QID) | ORAL | Status: DC | PRN

## 2022-05-09 MED ORDER — FENTANYL CITRATE (PF) 250 MCG/5ML IJ SOLN
INTRAMUSCULAR | Status: AC
  Filled 2022-05-09: qty 5

## 2022-05-09 MED ORDER — ONDANSETRON 4 MG PO TBDP
4.0000 mg | ORAL_TABLET | Freq: Four times a day (QID) | ORAL | Status: DC | PRN
  Filled 2022-05-09: qty 1

## 2022-05-09 MED ORDER — SODIUM CHLORIDE 0.9% FLUSH: 3.0000 mL | INTRAVENOUS | Status: DC | PRN

## 2022-05-09 MED ORDER — ACETAMINOPHEN 325 MG PO TABS: 325.0000 mg | ORAL_TABLET | ORAL | Status: DC | PRN

## 2022-05-09 MED ORDER — HYDROMORPHONE HCL 1 MG/ML IJ SOLN
0.5000 mg | INTRAMUSCULAR | Status: DC | PRN
  Administered 2022-05-09: 1 mg via INTRAVENOUS
  Filled 2022-05-09: qty 1

## 2022-05-09 MED ORDER — DEXAMETHASONE SODIUM PHOSPHATE 4 MG/ML IJ SOLN: 4.0000 mg | INTRAMUSCULAR | Status: DC

## 2022-05-09 SURGICAL SUPPLY — 74 items
APPLICATOR COTTON TIP 6 STRL (MISCELLANEOUS) ×1 IMPLANT
APPLICATOR COTTON TIP 6IN STRL (MISCELLANEOUS) ×1
APPLIER CLIP 5 13 M/L LIGAMAX5 (MISCELLANEOUS)
BAG COUNTER SPONGE SURGICOUNT (BAG) ×1 IMPLANT
BLADE SURG SZ11 CARB STEEL (BLADE) ×1 IMPLANT
CHLORAPREP W/TINT 26 (MISCELLANEOUS) ×1 IMPLANT
CLIP APPLIE 5 13 M/L LIGAMAX5 (MISCELLANEOUS) IMPLANT
COVER SURGICAL LIGHT HANDLE (MISCELLANEOUS) ×1 IMPLANT
COVER TIP SHEARS 8 DVNC (MISCELLANEOUS) IMPLANT
COVER TIP SHEARS 8MM DA VINCI (MISCELLANEOUS)
DEFOGGER SCOPE WARMER CLEARIFY (MISCELLANEOUS) IMPLANT
DRAIN CHANNEL 19F RND (DRAIN) IMPLANT
DRAIN PENROSE 0.5X18 (DRAIN) IMPLANT
DRAPE ARM DVNC X/XI (DISPOSABLE) ×4 IMPLANT
DRAPE COLUMN DVNC XI (DISPOSABLE) ×1 IMPLANT
DRAPE DA VINCI XI ARM (DISPOSABLE) ×4
DRAPE DA VINCI XI COLUMN (DISPOSABLE) ×1
DRAPE WARM FLUID 44X44 (DRAPES) ×1 IMPLANT
DRSG TEGADERM 2-3/8X2-3/4 SM (GAUZE/BANDAGES/DRESSINGS) ×5 IMPLANT
DRSG TEGADERM 4X4.75 (GAUZE/BANDAGES/DRESSINGS) IMPLANT
ELECT PENCIL ROCKER SW 15FT (MISCELLANEOUS) IMPLANT
ELECT REM PT RETURN 15FT ADLT (MISCELLANEOUS) ×1 IMPLANT
ENDOLOOP SUT PDS II  0 18 (SUTURE)
ENDOLOOP SUT PDS II 0 18 (SUTURE) IMPLANT
EVACUATOR DRAINAGE 10X20 100CC (DRAIN) IMPLANT
EVACUATOR SILICONE 100CC (DRAIN) IMPLANT
FELT TEFLON 4 X1 (Mesh General) ×1 IMPLANT
GAUZE SPONGE 2X2 8PLY STRL LF (GAUZE/BANDAGES/DRESSINGS) ×1 IMPLANT
GLOVE ECLIPSE 8.0 STRL XLNG CF (GLOVE) ×2 IMPLANT
GLOVE INDICATOR 8.0 STRL GRN (GLOVE) ×2 IMPLANT
GOWN STRL REUS W/ TWL XL LVL3 (GOWN DISPOSABLE) ×4 IMPLANT
GOWN STRL REUS W/TWL XL LVL3 (GOWN DISPOSABLE) ×4
GRASPER SUT TROCAR 14GX15 (MISCELLANEOUS) IMPLANT
IRRIG SUCT STRYKERFLOW 2 WTIP (MISCELLANEOUS) ×1
IRRIGATION SUCT STRKRFLW 2 WTP (MISCELLANEOUS) ×1 IMPLANT
KIT BASIN OR (CUSTOM PROCEDURE TRAY) ×1 IMPLANT
KIT TURNOVER KIT A (KITS) IMPLANT
MESH BIO-A 7X10 SYN MAT (Mesh General) IMPLANT
NEEDLE HYPO 22GX1.5 SAFETY (NEEDLE) ×1 IMPLANT
PACK CARDIOVASCULAR III (CUSTOM PROCEDURE TRAY) ×1 IMPLANT
PAD POSITIONING PINK XL (MISCELLANEOUS) ×1 IMPLANT
PENCIL SMOKE EVACUATOR (MISCELLANEOUS) IMPLANT
POUCH RETRIEVAL ECOSAC 10 (ENDOMECHANICALS) IMPLANT
POUCH RETRIEVAL ECOSAC 10MM (ENDOMECHANICALS) ×1
SCISSORS LAP 5X45 EPIX DISP (ENDOMECHANICALS) IMPLANT
SEAL CANN UNIV 5-8 DVNC XI (MISCELLANEOUS) ×4 IMPLANT
SEAL XI 5MM-8MM UNIVERSAL (MISCELLANEOUS) ×4
SEALER VESSEL DA VINCI XI (MISCELLANEOUS) ×1
SEALER VESSEL EXT DVNC XI (MISCELLANEOUS) ×1 IMPLANT
SOLUTION ELECTROLUBE (MISCELLANEOUS) ×1 IMPLANT
SPIKE FLUID TRANSFER (MISCELLANEOUS) ×1 IMPLANT
STAPLER CANNULA SEAL DVNC XI (STAPLE) IMPLANT
STAPLER CANNULA SEAL XI (STAPLE) ×1
STOPCOCK 4 WAY LG BORE MALE ST (IV SETS) ×2 IMPLANT
SUT ETHIBOND 0 36 GRN (SUTURE) ×2 IMPLANT
SUT ETHIBOND NAB CT1 #1 30IN (SUTURE) ×3 IMPLANT
SUT MNCRL AB 4-0 PS2 18 (SUTURE) ×1 IMPLANT
SUT PROLENE 2 0 BLUE (SUTURE) IMPLANT
SUT PROLENE 2 0 SH DA (SUTURE) IMPLANT
SUT V-LOC BARB 180 2/0GR6 GS22 (SUTURE) ×1
SUT VICRYL 0 TIES 12 18 (SUTURE) IMPLANT
SUT VICRYL 0 UR6 27IN ABS (SUTURE) IMPLANT
SUT VLOC 180 0 9IN  GS21 (SUTURE) ×1
SUT VLOC 180 0 9IN GS21 (SUTURE) IMPLANT
SUT VLOC 180 2-0 9IN GS21 (SUTURE) IMPLANT
SUTURE V-LC BRB 180 2/0GR6GS22 (SUTURE) IMPLANT
SYR 10ML LL (SYRINGE) ×1 IMPLANT
SYR 20ML LL LF (SYRINGE) ×1 IMPLANT
TOWEL OR 17X26 10 PK STRL BLUE (TOWEL DISPOSABLE) ×1 IMPLANT
TOWEL OR NON WOVEN STRL DISP B (DISPOSABLE) ×1 IMPLANT
TRAY FOLEY MTR SLVR 14FR STAT (SET/KITS/TRAYS/PACK) IMPLANT
TRAY FOLEY MTR SLVR 16FR STAT (SET/KITS/TRAYS/PACK) IMPLANT
TROCAR ADV FIXATION 5X100MM (TROCAR) ×1 IMPLANT
TUBING INSUFFLATION 10FT LAP (TUBING) ×1 IMPLANT

## 2022-05-09 NOTE — Op Note (Signed)
05/09/2022  12:59 PM  PATIENT:  Herbert Morrison  76 y.o. 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 ROBOTIC reduction of paraesophageal hiatal hernia 2. Type II mediastinal dissection. 3. Primary repair of hiatal hernia over pledgets.  4. Anterior & posterior gastropexy. 5. Toupet (270 degree partial posterior x 4cm)  fundoplication 6. Mesh reinforcement with absorbable mesh (BioA) 7.  Primary umbilical hernia repair 8.  TAP block  SURGEON:  Adin Hector, MD  ASSIST:  Johnathan Hausen, MD, FACS  An experienced assistant was required given the standard of surgical care given the complexity of the case.  This assistant was needed for exposure, dissection, suction, tissue approximation, retraction, perception, etc.   ANESTHESIA:   local and general  Regional TRANSVERSUS ABDOMINIS PLANE (TAP) nerve block for perioperative & postoperative pain control provided with liposomal bupivacaine (Experel) mixed with 0.25% bupivacaine as a Bilateral TAP block x 21m each side at the level of the transverse abdominis & preperitoneal spaces along the flank at the anterior axillary line, from subxiphoid subcostal ridge to iliac crest under laparoscopic guidance    EBL:  Total I/O In: 1500 [I.V.:1500] Out: 100 [Blood:100]  Delay start of Pharmacological VTE agent (>24hrs) due to surgical blood loss or risk of bleeding:  no  ANESTHESIA: 1. General anesthesia. 2. Local  anesthetic in a field block around all port sites.  SPECIMEN:  Mediastinal hernia sac (not sent).  DRAINS:  A 19-French Blake drain goes from the right upper quadrant along the lesser curvature of the stomach into the mediastinum.  COUNTS:  YES  PLAN OF CARE: Admit to inpatient   PATIENT DISPOSITION:  PACU - hemodynamically stable.  INDICATION:   Patient with symptomatic paraesophageal hiatal hernia.  The patient has had extensive work-up & we feel the patient will benefit from repair:  The anatomy & physiology of the foregut and anti-reflux mechanism was discussed.  The pathophysiology of hiatal herniation and GERD was discussed.  Natural history risks without surgery was discussed.   The patient's symptoms are not adequately controlled by medicines and other non-operative treatments.  I feel the risks of no intervention will lead to serious problems that outweigh the operative risks; therefore, I recommended surgery to reduce the hiatal hernia out of the chest and fundoplication to rebuild the anti-reflux valve and control reflux better.  Need for a thorough workup to rule out the differential diagnosis and plan treatment was explained.  I explained laparoscopic techniques with possible need for an open approach.  Risks such as bleeding, infection, abscess, leak, need for further treatment, heart attack, death, and other risks were discussed.   I noted a good likelihood this will help address the problem.  Goals of post-operative recovery were discussed as well.  Possibility that this will not correct all symptoms was explained.  Post-operative dysphagia, need for short-term liquid & pureed diet, inability to vomit, possibility of reherniation, possible need for medicines to help control symptoms in addition to surgery were discussed.  We will work to minimize complications.   Educational handouts further explaining the pathology,  treatment options, and dysphagia diet was given as well.   Questions were answered.  The patient expresses understanding & wishes to proceed with surgery.  OR FINDINGS:   Moderate-sized paraesophageal hiatal hernia with 33% of the stomach in the mediastinum.  There was a 9 x 6 cm hiatal defect.  It is a primary repair over pledgets.  Mesh reinforcement was used with GORE BIO-A mesh, a biosynthetic web scaffold made of 46% polyglycolic acid (PGA): 80% trimethylene carbonate (TMC).   The patient has a Toupet (270 degree partial posterior x 4cm)  fundoplication.  The patient has had anterior and posterior gastropexy.  DESCRIPTION:   Informed consent was confirmed.  The patient received IV antibiotics prior to incision.  The underwent general anesthesia without difficulty.  A Foley catheter sterilely placed.  The patient was positioned in split leg with arms tucked. The abdomen was prepped and draped in the sterile fashion.  Surgical time-out confirmed our plan.  I placed a 5 mm port in the left subcostal region using Varess entry technique with the patient in steep reverse Trendelenburg and left side up.  Entry was clean.  We induced carbon dioxide insufflation.  Camera inspection revealed no injury.  No peritonitis.  Umbilical hernia had omentum incarcerated that was reduced down.  No other abnormalities noted.  Under direct visualization, I placed extra ports.  I also placed a 5 mm port in the left subxiphoid region under direct visualization.  I removed that and placed an Omega-shaped rigid Nathanson liver retractor to lift the left lateral sector of the liver anteriorly to expose the esophageal hiatus.  This was secured to the bed using the iron man system.  The Xi robot was carefully docked and instruments placed and advanced under direct visualization.  We focused on dissection.  We grasped the anterior mediastinal sac at the apex of the crus.  I scored through that and got into the anterior mediastinum.  I was able to free the mediastinal sac from its  attachments to the pericardium and bilateral pleura using primarily focused gentle blunt dissection as well as focused vessel sealer dissection.  I transected phrenoesophageal attachments to the inner right crus, preserving a two centimeter cuff of mediastinal sac until I found the base of the crura.  I then came around anteriorly on the left side and freed up the phrenoesophageal attachments of the mediastinal sac on the medial part of the left crus on the superior half.  I did careful mediastinal dissection to free the mediastinal sac.  With that, we could relieve the suction cup affect of the hernia sac and help reduce the stomach back down into the abdomen, flipped back approriately.    We ligated the short gastrics along the lesser curvature of the stomach about a third the way down and then came up proximally over the fundus.  We released the attachments of the stomach to the retroperitoneum until we were able to connect with the prior dissection on the left crus.  We completed the release of phrenoesophageal attachments to the medial part of the left crus down to its base.  With this, we had circumferential mobilization.  Transected off some fatty hernia sacs as well as did a partial omentectomy since, it had been stuck up in there and was wispy and of poor quality.  That allowed better visualization of the esophageal hiatus.  We placed the stomach and esophagus on axial tension.  I then did a Type II mediastinal dissection where I  freed the esophagus from its attachments to the aorta, spine, pleura, and pericardium using primarily gentle blunt as well as focused ultrasonic dissection.  We saw the anterior & posterior vagus nerves intact.  We preserved it at all times.  I procedded to dissect about 5 cm proximally into the mediastinum.  With that I could straighten out the esophagus and get 5 cm of intra-abdominal length of the esophagus at a best estimation.  I freed the anterior mediastinal sac off  the esophagus & stomach.  We saw the anterior vagus nerve and freed the sac off of the vagus.  I dissected out & removed the fatty epiphrenic pads at the esophagogastric junction.  Resected more greater omentum.  Preserved lesser omentum along the lesser curvature.  With that, I could better define the esophagogastric junction.  I confirmed the the patient had 5 cm of intra-abdominal esophageal length off tension.  I brought the fundus of the stomach posterior to the esophagus over to the right side.  The wrap was mobile with the classic shoe shine maneuver.  Wrap became together gently.  We reflected the stomach left laterally and closed the esophageal hiatus using #1 Ethibond stitch using horizontal mattress stitches with pledgets on both sides.  I did that x3 stitches.  The crura had good substance and they came together well without any tension.  Because of the larger defect in setting of obesity, I reinforced the repair using a Bio-A 10x7 cm biosynthetic precut mesh. We brought the mesh in and laid it over the crural repair, tails anterior over the crura.  I tucked the broader "U" tail of the mesh between the left diaphragm and the spleen, the narrower "U" tail over the right crus.  I secured to the left lateral and left superior sides of the broader "U" tail to the left diaphragm band with 2-0 V lock running suture.  Secured the narrow towel to the right crus using a running 2-0 V-lock suture as well  I brought the fundus of the stomach behind the esophagus and cardia to set up a fundoplication wrap.  I did a posterior gastropexy by taking of #1 Ethibond stitches to the posterior part of the right side of the wrap and thru the Phasix mesh and crural closure.  I placed a similar stitch on the left anterior side as well.  That way the stomach covered the mesh and protected it from any esophageal exposure.  With the anterior and posterior gastropexy's, stomach laid well for a fundoplication wrap.  I then  did a classic 4cm Toupet fundoplication on the true esophagus above the cardia using 0 Ethibond stitch in the left superior side of the wrap, apex of the left inner crus then left anterior esophagus and tied that down to do a left anterior gastropexy.  Did a mirror-image stitch on the right side to do a right anterior gastropexy.  I then did 2 more distal pairs of suture between the inner part of the wrap and the anterolateral esophagus.  That way there were 3 total pairs of fundoplication sutures offering a posterior 270 degree wrap.  Measured at 4 cm.  Classic Toupet fundoplication.    The wrap was soft and floppy.  I placed a drain as noted above.  Secured in skin with a 2-0 Prolene vertical mattress suture.  I did irrigation and ensured hemostasis.  We had removed all needles and needle counts were correct.  Removed the hernia sac, epiphrenic pants, omentum through the  umbilical hernia.  I saw no evidence of any leak or perforation or other abnormality.  I removed the [facility] liver retractor under direct visualization.  I evacuated carbon dioxide and removed the ports.   I then focused on umbilical hernia repair.  Freed the umbilical stalk to expose the fascia.  2 cm defect was closed transversely using #1 PDS in interrupted fashion.  Trimmed down redundant skin and subcutaneous fat and tacked the umbilical stalk back down.  Skin at the umbilical hernia repair and port sites were closed with Monocryl and sterile dressings applied.  The patient is being extubated and brought back to the recovery room.  I discussed postop care in detail with the patient and family in in the office.  Discussed again with the patient & daughter in room with wife on the phone in the holding area.  I discussed operative findings, updated the patient's status, discussed probable steps to recovery, and gave postoperative recommendations to the patient's spouse, Tomi Bamberger .  Recommendations were made.  Questions were answered.  She  expressed understanding & appreciation.   Adin Hector, M.D., F.A.C.S. Gastrointestinal and Minimally Invasive Surgery [facility] [address] [place], [place] [address] [phone] Main / Paging

## 2022-05-09 NOTE — Progress Notes (Signed)
Patient resting comfortably in recovery room.  Denies pain.  On minimal oxygen.  Follow closely.

## 2022-05-09 NOTE — Discharge Instructions (Signed)
EATING AFTER YOUR ESOPHAGEAL SURGERY (Stomach Fundoplication, Hiatal Hernia repair, Achalasia surgery, etc)  ######################################################################  EAT Start with a pureed / full liquid diet (see below) Gradually transition to a high fiber diet with a fiber supplement over the next month after discharge.    WALK Walk an hour a day.  Control your pain to do that.    CONTROL PAIN Control pain so that you can walk, sleep, tolerate sneezing/coughing, go up/down stairs.  HAVE A BOWEL MOVEMENT DAILY Keep your bowels regular to avoid problems.  OK to try a laxative to override constipation.  OK to use an antidairrheal to slow down diarrhea.  Call if not better after 2 tries  CALL IF YOU HAVE PROBLEMS/CONCERNS Call if you are still struggling despite following these instructions. Call if you have concerns not answered by these instructions  ######################################################################   After your esophageal surgery, expect some sticking with swallowing over the next 1-2 months.    If food sticks when you eat, it is called "dysphagia".  This is due to swelling around your esophagus at the wrap & hiatal diaphragm repair.  It will gradually ease off over the next few months.  To help you through this temporary phase, we start you out on a pureed (blenderized) diet.  Your first meal in the hospital was thin liquids.  You should have been given a pureed diet by the time you left the hospital.  We ask patients to stay on a pureed diet for the first 2-3 weeks to avoid anything getting "stuck" near your recent surgery.  Don't be alarmed if your ability to swallow doesn't progress according to this plan.  Everyone is different and some diets can advance more or less quickly.    It is often helpful to crush your medications or split them as they can sometimes stick, especially the first week or so.   Some BASIC RULES to follow are: Maintain  an upright position whenever eating or drinking. Take small bites - just a teaspoon size bite at a time. Eat slowly.  It may also help to eat only one food at a time. Consider nibbling through smaller, more frequent meals & avoid the urge to eat BIG meals Do not push through feelings of fullness, nausea, or bloatedness Do not mix solid foods and liquids in the same mouthful Try not to "wash foods down" with large gulps of liquids. Avoid carbonated (bubbly/fizzy) drinks.   Avoid foods that make you feel gassy or bloated.  Start with bland foods first.  Wait on trying greasy, fried, or spicy meals until you are tolerating more bland solids well. Understand that it will be hard to burp and belch at first.  This gradually improves with time.  Expect to be more gassy/flatulent/bloated initially.  Walking will help your body manage it better. Consider using medications for bloating that contain simethicone such as  Maalox or Gas-X  Consider crushing her medications, especially smaller pills.  The ability to swallow pills should get easier after a few weeks Eat in a relaxed atmosphere & minimize distractions. Avoid talking while eating.   Do not use straws. Following each meal, sit in an upright position (90 degree angle) for 60 to 90 minutes.  Going for a short walk can help as well If food does stick, don't panic.  Try to relax and let the food pass on its own.  Sipping WARM LIQUID such as strong hot black tea can also help slide it down.  Be gradual in changes & use common sense:  -If you easily tolerating a certain "level" of foods, advance to the next level gradually -If you are having trouble swallowing a particular food, then avoid it.   -If food is sticking when you advance your diet, go back to thinner previous diet (the lower LEVEL) for 1-2 days.  LEVEL 1 = PUREED DIET  Do for the first 2 WEEKS AFTER SURGERY  -Foods in this group are pureed or blenderized to a smooth, mashed  potato-like consistency.  -If necessary, the pureed foods can keep their shape with the addition of a thickening agent.   -Meat should be pureed to a smooth, pasty consistency.  Hot broth or gravy may be added to the pureed meat, approximately 1 oz. of liquid per 3 oz. serving of meat. -CAUTION:  If any foods do not puree into a smooth consistency, swallowing will be more difficult.  (For example, nuts or seeds sometimes do not blend well.)  Hot Foods Cold Foods  Pureed scrambled eggs and cheese Pureed cottage cheese  Baby cereals Thickened juices and nectars  Thinned cooked cereals (no lumps) Thickened milk or eggnog  Pureed [place] toast or pancakes Ensure  Mashed potatoes Ice cream  Pureed parsley, au gratin, scalloped potatoes, candied sweet potatoes Fruit or [place] ice, sherbet  Pureed buttered or alfredo noodles Plain yogurt  Pureed vegetables (no corn or peas) Instant breakfast  Pureed soups and creamed soups Smooth pudding, mousse, custard  Pureed scalloped apples Whipped gelatin  Gravies Sugar, syrup, honey, jelly  Sauces, cheese, tomato, barbecue, white, creamed Cream  Any baby food Creamer  Alcohol in moderation (not beer or champagne) Margarine  Coffee or tea Mayonnaise   Ketchup, mustard   Apple sauce   SAMPLE MENU:  PUREED DIET Breakfast Lunch Dinner  Orange juice, 1/2 cup Cream of wheat, 1/2 cup Pineapple juice, 1/2 cup Pureed [place], barley soup, 3/4 cup Pureed Hawaiian chicken, 3 oz  Scrambled eggs, mashed or blended with cheese, 1/2 cup Tea or coffee, 1 cup  Whole milk, 1 cup  Non-dairy creamer, 2 Tbsp. Mashed potatoes, 1/2 cup Pureed cooled broccoli, 1/2 cup Apple sauce, 1/2 cup Coffee or tea Mashed potatoes, 1/2 cup Pureed spinach, 1/2 cup Frozen yogurt, 1/2 cup Tea or coffee      LEVEL 2 = SOFT DIET  After your first 2 weeks, you can advance to a soft diet.   Keep on this diet until everything goes down easily.  Hot Foods Cold Foods  White fish  Cottage cheese  Stuffed fish Junior baby fruit  Baby food meals Semi thickened juices  Minced soft cooked, scrambled, poached eggs nectars  Souffle & omelets Ripe mashed bananas  Cooked cereals Canned fruit, pineapple sauce, milk  potatoes Milkshake  Buttered or Alfredo noodles Custard  Cooked cooled vegetable Puddings, including tapioca  Sherbet Yogurt  Vegetable soup or alphabet soup Fruit ice, [place] ice  Gravies Whipped gelatin  Sugar, syrup, honey, jelly Junior baby desserts  Sauces:  Cheese, creamed, barbecue, tomato, white Cream  Coffee or tea Margarine   SAMPLE MENU:  LEVEL 2 Breakfast Lunch Dinner  Orange juice, 1/2 cup Oatmeal, 1/2 cup Scrambled eggs with cheese, 1/2 cup Decaffeinated tea, 1 cup Whole milk, 1 cup Non-dairy creamer, 2 Tbsp Pineapple juice, 1/2 cup Minced beef, 3 oz Gravy, 2 Tbsp Mashed potatoes, 1/2 cup Minced fresh broccoli, 1/2 cup Applesauce, 1/2 cup Coffee, 1 cup [place], barley soup, 3/4 cup Minced Hawaiian chicken, 3 oz  Mashed potatoes, 1/2 cup Cooked spinach, 1/2 cup Frozen yogurt, 1/2 cup Non-dairy creamer, 2 Tbsp      LEVEL 3 = CHOPPED DIET  -After all the foods in level 2 (soft diet) are passing through well you should advance up to more chopped foods.  -It is still important to cut these foods into small pieces and eat slowly.  Hot Foods Cold Foods  Poultry Cottage cheese  Chopped Swedish meatballs Yogurt  Meat salads (ground or flaked meat) Milk  Flaked fish (tuna) Milkshakes  Poached or scrambled eggs Soft, cold, dry cereal  Souffles and omelets Fruit juices or nectars  Cooked cereals Chopped canned fruit  Chopped [place] toast or pancakes Canned fruit cocktail  Noodles or pasta (no rice) Pudding, mousse, custard  Cooked vegetables (no frozen peas, corn, or mixed vegetables) Green salad  Canned small sweet peas Ice cream  Creamed soup or vegetable soup Fruit ice, [place] ice  Pureed vegetable soup or alphabet soup  Non-dairy creamer  Ground scalloped apples Margarine  Gravies Mayonnaise  Sauces:  Cheese, creamed, barbecue, tomato, white Ketchup  Coffee or tea Mustard   SAMPLE MENU:  LEVEL 3 Breakfast Lunch Dinner  Orange juice, 1/2 cup Oatmeal, 1/2 cup Scrambled eggs with cheese, 1/2 cup Decaffeinated tea, 1 cup Whole milk, 1 cup Non-dairy creamer, 2 Tbsp Ketchup, 1 Tbsp Margarine, 1 tsp Salt, 1/4 tsp Sugar, 2 tsp Pineapple juice, 1/2 cup Ground beef, 3 oz Gravy, 2 Tbsp Mashed potatoes, 1/2 cup Cooked spinach, 1/2 cup Applesauce, 1/2 cup Decaffeinated coffee Whole milk Non-dairy creamer, 2 Tbsp Margarine, 1 tsp Salt, 1/4 tsp Pureed [place], barley soup, 3/4 cup Barbecue chicken, 3 oz Mashed potatoes, 1/2 cup Ground fresh broccoli, 1/2 cup Frozen yogurt, 1/2 cup Decaffeinated tea, 1 cup Non-dairy creamer, 2 Tbsp Margarine, 1 tsp Salt, 1/4 tsp Sugar, 1 tsp    LEVEL 4:  REGULAR FOODS  -Foods in this group are soft, moist, regularly textured foods.   -This level includes meat and breads, which tend to be the hardest things to swallow.   -Eat very slowly, chew well and continue to avoid carbonated drinks. -most people are at this level in 4-6 weeks  Hot Foods Cold Foods  Baked fish or skinned Soft cheeses - cottage cheese  Souffles and omelets Cream cheese  Eggs Yogurt  Stuffed shells Milk  Spaghetti with meat sauce Milkshakes  Cooked cereal Cold dry cereals (no nuts, dried fruit, coconut)  [place] toast or pancakes Crackers  Buttered toast Fruit juices or nectars  Noodles or pasta (no rice) Canned fruit  Potatoes (all types) Ripe bananas  Soft, cooked vegetables (no corn, lima, or baked beans) Peeled, ripe, fresh fruit  Creamed soups or vegetable soup Cakes (no nuts, dried fruit, coconut)  Canned chicken noodle soup Plain doughnuts  Gravies Ice cream  Bacon dressing Pudding, mousse, custard  Sauces:  Cheese, creamed, barbecue, tomato, white Fruit ice, [place] ice, sherbet   Decaffeinated tea or coffee Whipped gelatin  Pork chops Regular gelatin   Canned fruited gelatin molds   Sugar, syrup, honey, jam, jelly   Cream   Non-dairy   Margarine   Oil   Mayonnaise   Ketchup   Mustard   TROUBLESHOOTING IRREGULAR BOWELS  1) Avoid extremes of bowel movements (no bad constipation/diarrhea)  2) Miralax 17gm mixed in 8oz. water or juice-daily. May use BID as needed.  3) Gas-x,Phazyme, etc. as needed for gas & bloating.  4) Soft,bland diet. No spicy,greasy,fried foods.  5) Prilosec over-the-counter  as needed  6) May hold gluten/wheat products from diet to see if symptoms improve.  7) May try probiotics (Align, Activa, etc) to help calm the bowels down  7) If symptoms become worse call back immediately.    If you have any questions please call our office at [place]: [phone].    #################  ################################################################  LAPAROSCOPIC SURGERY: POST OP INSTRUCTIONS  ######################################################################  EAT   Start with a pureed / full liquid diet (see esophageal surgery diet instructions above )  WALK Walk an hour a day.  Control your pain to do that.    CONTROL PAIN Control pain so that you can walk, sleep, tolerate sneezing/coughing, go up/down stairs.  HAVE A BOWEL MOVEMENT DAILY Keep your bowels regular to avoid problems.  OK to try a laxative to override constipation.  OK to use an antidairrheal to slow down diarrhea.  Call if not better after 2 tries  CALL IF YOU HAVE PROBLEMS/CONCERNS Call if you are still struggling despite following these instructions. Call if you have concerns not answered by these instructions  ######################################################################    DIET: See esophageal surgery instructions above.  Take your usually prescribed home medications unless otherwise directed.  PAIN CONTROL: Pain is best  controlled by a usual combination of three different methods TOGETHER: Ice/Heat Over the counter pain medication Prescription pain medication Most patients will experience some swelling and bruising around the incisions.  Ice packs or heating pads (30-60 minutes up to 6 times a day) will help. Use ice for the first few days to help decrease swelling and bruising, then switch to heat to help relax tight/sore spots and speed recovery.  Some people prefer to use ice alone, heat alone, alternating between ice & heat.  Experiment to what works for you.  Swelling and bruising can take several weeks to resolve.   It is helpful to take an over-the-counter pain medication regularly for the first few weeks.  Choose one of the following that works best for you: Naproxen (Aleve, etc)  Two 262m tabs twice a day Ibuprofen (Advil, etc) Three 2073mtabs four times a day (every meal & bedtime) Acetaminophen (Tylenol, etc) 500-65065mour times a day (every meal & bedtime) A  prescription for pain medication (such as oxycodone, hydrocodone, tramadol, gabapentin, methocarbamol, etc) should be given to you upon discharge.  Take your pain medication as prescribed.  If you are having problems/concerns with the prescription medicine (does not control pain, nausea, vomiting, rash, itching, etc), please call us [phone] see if we need to switch you to a different pain medicine that will work better for you and/or control your side effect better. If you need a refill on your pain medication, please give us [place] hour notice.  contact your pharmacy.  They will contact our office to request authorization. Prescriptions will not be filled after 5 pm or on week-ends  Avoid getting constipated.   Between the surgery and the pain medications, it is common to experience some constipation.   Increasing fluid intake and taking a fiber supplement (such as Metamucil, Citrucel, FiberCon, MiraLax, etc) 1-2 times a day regularly will  usually help prevent this problem from occurring.   A mild laxative (prune juice, Milk of Magnesia, MiraLax, etc) should be taken according to package directions if there are no bowel movements after 48 hours.   Watch out for diarrhea.   If you have many loose bowel movements, simplify your diet to bland foods & liquids for  a few days.   Stop any stool softeners and decrease your fiber supplement.   Switching to mild anti-diarrheal medications (Kayopectate, Pepto Bismol) can help.   If this worsens or does not improve, please call us.  Wash / shower every day.  You may shower over the dressings as they are waterproof.  Continue to shower over incision(s) after the dressing is off.  It is good for closed incisions and even open wounds to be washed every day.  Shower every day.  Short baths are fine.  Wash the incisions and wounds clean with soap & water.    You may leave closed incisions open to air if it is dry.   You may cover the incision with clean gauze & replace it after your daily shower for comfort.  TEGADERM:  You have clear gauze band-aid dressings over your closed incision(s).  Remove the dressings 3 days after surgery.= 05/12/2022    ACTIVITIES as tolerated:   You may resume regular (light) daily activities beginning the next day--such as daily self-care, walking, climbing stairs--gradually increasing activities as tolerated.  If you can walk 30 minutes without difficulty, it is safe to try more intense activity such as jogging, treadmill, bicycling, low-impact aerobics, swimming, etc. Save the most intensive and strenuous activity for last such as sit-ups, heavy lifting, contact sports, etc  Refrain from any heavy lifting or straining until you are off narcotics for pain control.   DO NOT PUSH THROUGH PAIN.  Let pain be your guide: If it hurts to do something, don't do it.  Pain is your body warning you to avoid that activity for another week until the pain goes down. You may drive  when you are no longer taking prescription pain medication, you can comfortably wear a seatbelt, and you can safely maneuver your car and apply brakes. You may have sexual intercourse when it is comfortable.  FOLLOW UP in our office Please call [facility] at [phone] to set up an appointment to see your surgeon in the office for a follow-up appointment approximately 2-3 weeks after your surgery. Make sure that you call for this appointment the day you arrive home to insure a convenient appointment time.  10. IF YOU HAVE DISABILITY OR FAMILY LEAVE FORMS, BRING THEM TO THE OFFICE FOR PROCESSING.  DO NOT GIVE THEM TO YOUR DOCTOR.   WHEN TO CALL us [phone]: Poor pain control Reactions / problems with new medications (rash/itching, nausea, etc)  Fever over 101.5 F (38.5 C) Inability to urinate Nausea and/or vomiting Worsening swelling or bruising Continued bleeding from incision. Increased pain, redness, or drainage from the incision   The clinic staff is available to answer your questions during regular business hours (8:30am-5pm).  Please don't hesitate to call and ask to speak to one of our nurses for clinical concerns.   If you have a medical emergency, go to the nearest emergency room or call 911.  A surgeon from [facility] is always on call at the [facility], [place], [place], [place], [place]  [id number] ? MAIN: [phone] ? TOLL FREE: [phone] ?  FAX [phone] [email or web address]  ##############################################################

## 2022-05-09 NOTE — Interval H&P Note (Signed)
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 UMBILICAL HERNIA.  The various methods of treatment have been discussed with the patient and family. After consideration of risks, benefits and other options for treatment, the patient has consented to  Procedure(s) with comments: ROBOTIC REPAIR OF PARAESOPHAGEAL HIATAL HERNIA WITH FUNDOPLICATION (N/A) - GEN w/ERAS LOCAL PRIMARY UMBILICAL HERNIA REPAIR (N/A) as a surgical intervention.  The patient's history has been reviewed, patient examined, no change in status, stable for surgery.  I have reviewed the patient's chart and labs.  Questions were answered to the patient's satisfaction.    I have re-reviewed the the patient's records, history, medications, and allergies.  I have re-examined the patient.  I again discussed intraoperative plans and goals of post-operative recovery.  The patient agrees to proceed.  Herbert Morrison  07-Aug-1945 [id number]  Patient Care Team: Andres Shad, MD as PCP - General (Family Medicine) Sondra Come, Barnie Alderman, MD as Referring Physician (Cardiology) Michael Boston, MD as Consulting Physician (General Surgery) Daryel November, MD as Consulting Physician (Gastroenterology) Maryjane Hurter, MD as Consulting Physician (Pulmonary Disease)  Patient Active Problem List   Diagnosis Date Noted   Anxiety 04/24/2022   Depression 04/24/2022   PTSD (post-traumatic stress disorder) 04/24/2022   Rheumatoid arthritis ([place]) 04/24/2022   Lupus ([place]) 04/24/2022   CKD (chronic kidney disease) stage 3, GFR 30-59 ml/min ([place]) 04/24/2022   Encounter for vitamin deficiency screening 01/10/2021   S/P left TKA 08/11/2019   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   History of total knee arthroplasty 08/11/2019   Overweight (BMI 25.0-29.9) 03/25/2017   History of  knee replacement 03/24/2017   History of repair of hip joint 03/24/2017   GERD (gastroesophageal reflux disease) 01/29/2016   Ulcerative pancolitis with complication ([place]) 85/88/5027   Essential hypertension 07/17/2014    Past Medical History:  Diagnosis Date   Anxiety    Arthritis    Cancer ([place])    skin cancer removed Left shoulder Basal cell   Depression    Dyspnea    GERD (gastroesophageal reflux disease)    Heart murmur    as a child   Hiatal hernia    History of hiatal hernia    Hypertension    Lupus ([place]) 12/31/2020   Rheumatoid arthritis ([place]) 12/31/2020   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[facility])     Past Surgical History:  Procedure Laterality Date   BIOPSY  01/30/2021   Procedure: BIOPSY;  Surgeon: Rogene Houston, MD;  [facility];  Service: Endoscopy;;   CATARACT EXTRACTION Left 2023   COLONOSCOPY N/A 04/09/2016   Procedure: COLONOSCOPY;  Surgeon: Rogene Houston, MD;  [facility];  Service: Endoscopy;  Laterality: N/A;  1:45   COLONOSCOPY WITH PROPOFOL N/A 01/30/2021   Procedure: COLONOSCOPY WITH PROPOFOL;  Surgeon: Rogene Houston, MD;  [facility];  Service: Endoscopy;  Laterality: N/A;   ELBOW BURSA SURGERY Left 1983   ESOPHAGOGASTRODUODENOSCOPY (EGD) WITH PROPOFOL N/A 01/30/2021   Procedure: ESOPHAGOGASTRODUODENOSCOPY (EGD) WITH PROPOFOL;  Surgeon: Rogene Houston, MD;  [facility];  Service: Endoscopy;  Laterality: N/A;  2:00, pt knows to arrive at 11:00   KNEE ARTHROSCOPY Right 1979   left elbow     spur removed   left knee surgery     open surgery for a meniscus tear 1970's  POLYPECTOMY  04/09/2016   Procedure: POLYPECTOMY;  Surgeon: Rogene Houston, MD;  [facility];  Service: Endoscopy;;  cecal polyp   POLYPECTOMY  01/30/2021   Procedure: POLYPECTOMY;  Surgeon: Rogene Houston, MD;  [facility];  Service: Endoscopy;;   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   Procedure: LEFT TOTAL  HIP ARTHROPLASTY ANTERIOR APPROACH;  Surgeon: Paralee Cancel, MD;  Location: [facility];  Service: Orthopedics;  Laterality: Left;  70 mins   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   Procedure: TOTAL KNEE ARTHROPLASTY;  Surgeon: Paralee Cancel, MD;  Location: [facility];  Service: Orthopedics;  Laterality: Left;  70 mins    Social History   Socioeconomic History   Marital status: Married    Spouse name: Not on file   Number of children: Not on file   Years of education: Not on file   Highest education level: Not on file  Occupational History   Not on file  Tobacco Use   Smoking status: Former    Packs/day: 1.00    Years: 5.00    Total pack years: 5.00    Types: Cigarettes    Quit date: 59    Years since quitting: 49.0   Smokeless tobacco: Former    Types: Chew    Quit date: 01/22/1965   Tobacco comments:    quit 43 years ago  Vaping Use   Vaping Use: Never used  Substance and Sexual Activity   Alcohol use: Not Currently    Comment: occasionally   Drug use: No   Sexual activity: Yes  Other Topics Concern   Not on file  Social History Narrative   Not on file   Social Determinants of Health   Financial Resource Strain: Not on file  Food Insecurity: Not on file  Transportation Needs: Not on file  Physical Activity: Not on file  Stress: Not on file  Social Connections: Not on file  Intimate Partner Violence: Not on file    Family History  Problem Relation Age of Onset   Leukemia Father    Bladder Cancer Father    Stomach cancer Neg Hx    Esophageal cancer Neg Hx    Colon cancer Neg Hx    Pancreatic cancer Neg Hx     Medications Prior to Admission  Medication Sig Dispense Refill Last Dose   amLODipine-valsartan (EXFORGE) 10-320 MG tablet Take 1 tablet by mouth in the morning.   05/08/2022   buPROPion (WELLBUTRIN XL) 300 MG 24 hr tablet Take 300 mg by mouth in the morning.   05/09/2022 at 0400   Cholecalciferol (VITAMIN D-3 PO) Take 3 tablets by mouth in the morning.    05/08/2022   Cyanocobalamin (B-12 SL) Take 1 tablet by mouth in the morning.   05/08/2022   cyclobenzaprine (FLEXERIL) 10 MG tablet Take 10 mg by mouth 3 (three) times daily as needed for muscle spasms.   Past Week   escitalopram (LEXAPRO) 20 MG tablet Take 20 mg by mouth in the morning.   05/08/2022   fluticasone (FLONASE) 50 MCG/ACT nasal spray Place 1 spray into both nostrils daily.   05/08/2022   hydrALAZINE (APRESOLINE) 50 MG tablet Take 50 mg by mouth in the morning and at bedtime.   05/09/2022 at 0400   hydroxychloroquine (PLAQUENIL) 200 MG tablet Take 200 mg by mouth 2 (two) times daily.   05/08/2022 at 05   ibuprofen (ADVIL) 200 MG tablet Take 600 mg by mouth daily as needed for  mild pain (Joint inflammation).   Past Week   ketoconazole (NIZORAL) 2 % shampoo Apply 1 Application topically every other day. Every other shower   Past Week   LORazepam (ATIVAN) 2 MG tablet Take 2 mg by mouth in the morning and at bedtime.   Past Week   metoprolol succinate (TOPROL-XL) 50 MG 24 hr tablet Take 50 mg by mouth in the morning. Take with or immediately following a meal.   05/08/2022   omeprazole (PRILOSEC) 20 MG capsule Take 20 mg by mouth in the morning.   05/09/2022 at 0400   predniSONE (DELTASONE) 5 MG tablet Take 10 mg by mouth in the morning.   05/09/2022 at 0400   zinc gluconate 50 MG tablet Take 50 mg by mouth in the morning.   Past Week    Current Facility-Administered Medications  Medication Dose Route Frequency Provider Last Rate Last Admin   bupivacaine liposome (EXPAREL) 1.3 % injection 266 mg  20 mL Infiltration Once Michael Boston, MD       cefTRIAXone (ROCEPHIN) 2 g in sodium chloride 0.9 % 100 mL IVPB  2 g Intravenous On Call to OR Michael Boston, MD       And   metroNIDAZOLE (FLAGYL) IVPB 500 mg  500 mg Intravenous On Call to OR Michael Boston, MD       Chlorhexidine Gluconate Cloth 2 % PADS 6 each  6 each Topical Once Michael Boston, MD       dexamethasone (DECADRON) injection 4  mg  4 mg Intravenous On Call to OR Michael Boston, MD       feeding supplement (ENSURE PRE-SURGERY) liquid 296 mL  296 mL Oral Once Michael Boston, MD       feeding supplement (ENSURE PRE-SURGERY) liquid 592 mL  592 mL Oral Once Michael Boston, MD       lactated ringers infusion   Intravenous Continuous Lidia Collum, MD 10 mL/hr at 05/09/22 0700 New Bag at 05/09/22 0700   scopolamine (TRANSDERM-SCOP) 1 MG/3DAYS 1.5 mg  1 patch Transdermal On Call to Candis Shine, MD   1.5 mg at 05/09/22 9528     Allergies  Allergen Reactions   Remicade [Infliximab] Anaphylaxis   Erythromycin Nausea Only   Grass Pollen(K-O-R-T-Swt Vern) Other (See Comments)    Seasonal allergies    Pollen Extract Other (See Comments)   Sulfasalazine Nausea And Vomiting    BP (!) 172/80 (BP Location: Right Arm)   Pulse 77   Temp (!) 97.5 F (36.4 C) (Oral)   Resp (!) 22   Ht 5' 5"  (1.651 m)   Wt 100.7 kg   SpO2 94%   BMI 36.94 kg/m   Labs: No results found for this or any previous visit (from the past 48 hour(s)).  Imaging / Studies: No results found.   Adin Hector, M.D., F.A.C.S. Gastrointestinal and Minimally Invasive Surgery [facility] [address], [place], [place] [address] [phone] Main / Paging  05/09/2022 7:18 AM    Adin Hector

## 2022-05-09 NOTE — Anesthesia Procedure Notes (Addendum)
Arterial Line Insertion Start/End12/29/2023 9:05 AM, 05/09/2022 9:20 AM Performed by: Janeece Riggers, MD, anesthesiologist  Preanesthetic checklist: patient identified, IV checked, site marked, risks and benefits discussed, surgical consent, monitors and equipment checked, pre-op evaluation, timeout performed and anesthesia consent Patient sedated  Attempts: 2 Procedure performed without using ultrasound guided technique. Following insertion, dressing applied.

## 2022-05-09 NOTE — Interval H&P Note (Signed)
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 UMBILICAL HERNIA.  The various methods of treatment have been discussed with the patient and family. After consideration of risks, benefits and other options for treatment, the patient has consented to  Procedure(s) with comments: ROBOTIC REPAIR OF PARAESOPHAGEAL HIATAL HERNIA WITH FUNDOPLICATION (N/A) - GEN w/ERAS LOCAL PRIMARY UMBILICAL HERNIA REPAIR (N/A) as a surgical intervention.  The patient's history has been reviewed, patient examined, no change in status, stable for surgery.  I have reviewed the patient's chart and labs.  Questions were answered to the patient's satisfaction.    Manometry shows normal esophageal motility.  Gastric tube study shows normal emptying.  Ultrasound shows no gallstones no Murphy sign or irritation.  Focus on hiatal hernia repair.  Discussed with anesthesia.  Patient has some anxiety and abdominal muscle spasms.  There is no guarding or peritonitis.  He is not hypoxic.  He has been seen by pulmonary and cardiology in the past few months with normal exercise stress test.  Anesthesia to proceed.  Adin Hector

## 2022-05-09 NOTE — H&P (Signed)
05/09/2022   REFERRING PHYSICIAN: Rosamaria Lints, MD  Patient Care Team: Marlinda Mike, MD as PCP - General (Family Medicine) Patrice Paradise, MD (Gastroenterology) Verlee Monte Johny Blamer, MD (Pulmonary Disease) Sondra Come Barnie Alderman, MD as Consulting Provider (Cardiovascular Disease) Johney Maine, Adrian Saran, MD as Consulting Provider (General Surgery)  PROVIDER: Hollace Kinnier, MD  [facility] MRN: [id number] DOB: 1945-10-02  SUBJECTIVE   Chief Complaint: Hiatal Hernia   History of Present Illness: Herbert Morrison is a 76 y.o. male who is seen today  as an office consultation at the request of Dr. Candis Schatz  for evaluation of Hiatal Hernia .   Obese male does not struggling with worsening heartburn reflux for many years. Followed by Dr. Melony Overly but more recently Dr. Jenetta Downer and now Dr. Candis Schatz. Adline Peals burning and soreness often rating up to his throat. Hoarseness. Cannot lie down flat. Often has to sleep in pillows or recliner. Burping belching. Worried about taking PPIs with his history of chronic kidney disease so just takes it. Omeprazole. Patient also with ulcerative colitis rheumatoid arthritis. He has been on immunosuppressive's. Does not taking sulfasalazine nor Remicade. Had colonoscopy that showed some evidence of colitis last year. Because he was feeling worse for many issues has been on steroids 10-20 mg a day. He feels like that is helped with most of his symptoms. He notes heavier or greasy or spicy foods tend to bother him more than not. Had a lot of chest discomfort. Wondered if this was heart or lung issues. Had work-up done in [place]. Had exercise tolerance test that actually look pretty good. Normal ejection fraction echocardiogram. Pulmonary work-up negative for any PE. Large hiatal hernia noted. Seemed larger than prior studies. He does have some hypersomnolence and snoring. Pulmonary wonders if he may have sleep apnea but has not been able to  tolerate the sleep studies. Using CPAP.  Patient claims he moves his bowels about twice a day. No major hematochezia or melena. He can walk about 1/4 mile before he feels uncomfortable and has to stop. No diabetes. Does not smoke. He has never had any abdominal surgery. He thinks he has had a bellybutton hernia "outie" for many years. His wife is with him try to help follow his medical work-up.  Medical History:  Past Medical History:  Diagnosis Date  Anxiety  Arthritis  Chronic kidney disease  GERD (gastroesophageal reflux disease)  Hypertension  Sleep apnea   There is no problem list on file for this patient.  Past Surgical History:  Procedure Laterality Date  JOINT REPLACEMENT    Allergies  Allergen Reactions  Infliximab Anaphylaxis  Erythromycin Nausea  Sulfasalazine Nausea And Vomiting   Current Outpatient Medications on File Prior to Visit  Medication Sig Dispense Refill  amLODIPine-valsartan (EXFORGE) 10-320 mg tablet Take 1 tablet by mouth once daily  ammonium lactate (AMLACTIN) 12 % cream Apply topically  buPROPion (WELLBUTRIN XL) 300 MG XL tablet Take by mouth  cyanocobalamin (VITAMIN B12) 1000 MCG tablet Take by mouth  cyclobenzaprine (FLEXERIL) 10 MG tablet Take 10 mg by mouth 3 (three) times daily as needed  fluticasone propionate (FLONASE) 50 mcg/actuation nasal spray Place into one nostril  FUROsemide (LASIX) 20 MG tablet Take 20 mg by mouth once daily  hydrALAZINE (APRESOLINE) 50 MG tablet Take by mouth  ibuprofen (MOTRIN) 200 MG tablet Take by mouth  ketoconazole (NIZORAL) 2 % shampoo  levocetirizine (XYZAL) 5 MG tablet Take 5 mg by mouth once daily  LORazepam (ATIVAN) 2 MG tablet Take  by mouth  metoprolol succinate (TOPROL-XL) 50 MG XL tablet Take by mouth  omeprazole-sodium bicarbonate (ZEGERID) 20-1.1 mg-gram capsule Take by mouth  predniSONE (DELTASONE) 5 MG tablet Take 10 mg by mouth once daily  zinc gluconate 50 mg tablet Take 50 mg by mouth once  daily   No current facility-administered medications on file prior to visit.   Family History  Problem Relation Age of Onset  High blood pressure (Hypertension) Mother  High blood pressure (Hypertension) Father    Social History   Tobacco Use  Smoking Status Former  Types: Cigarettes  Quit date: 1975  Years since quitting: 48.8  Smokeless Tobacco Not on file    Social History   Socioeconomic History  Marital status: Married  Tobacco Use  Smoking status: Former  Types: Cigarettes  Quit date: 1975  Years since quitting: 48.8  Substance and Sexual Activity  Alcohol use: Not Currently  Drug use: Yes  Comment: CBD   ############################################################  Review of Systems: A complete review of systems (ROS) was obtained from the patient. I have reviewed this information and discussed as appropriate with the patient. See HPI as well for other pertinent ROS.  Constitutional: No fevers, chills, sweats. Weight stable Eyes: No vision changes, No discharge HENT: No sore throats, nasal drainage Lymph: No neck swelling, No bruising easily Pulmonary: No cough, productive sputum CV: No orthopnea, PND . No exertional chest/neck/shoulder/arm pain. Patient can walk 1/4 mile gradually.   GI: No personal nor family history of GI/colon cancer, irritable bowel syndrome, allergy such as Celiac Sprue, dietary/dairy problems, colitis, ulcers nor gastritis. No recent sick contacts/gastroenteritis. No travel outside the country. No changes in diet.  Renal: No UTIs, No hematuria Genital: No drainage, bleeding, masses Musculoskeletal: No severe joint pain. Fair ROM major joints Skin: No sores or lesions Heme/Lymph: No easy bleeding. No swollen lymph nodes Neuro: No active seizures. No facial droop Psych: No hallucinations. No agitation  OBJECTIVE   Vitals:  03/11/22 1137  Pulse: 61  Weight: (!) 102.6 kg (226 lb 3.2 oz)  Height: 165.1 cm (5' 5" )   Body mass  index is 37.64 kg/m.  PHYSICAL EXAM:  Constitutional: Not cachectic. Hygeine adequate. Vitals signs as above.  Eyes: Wears glasses - vision corrected,Pupils reactive, normal extraocular movements. Sclera nonicteric Neuro: CN II-XII intact. No major focal sensory defects. No major motor deficits. Lymph: No head/neck/groin lymphadenopathy Psych: Mildly agitated and restless. Somewhat anxious but consolable. No severe agitation. No severe anxiety. Judgment & insight Adequate, Oriented x4, HENT: Normocephalic, Mucus membranes moist. No thrush. Hearing: adequate Neck: Supple, No tracheal deviation. No obvious thyromegaly Chest: No pain to chest wall compression. Good respiratory excursion. No audible wheezing CV: Pulses intact. regular. No major extremity edema Ext: No obvious deformity or contracture. Edema: Not present. No cyanosis Skin: No major subcutaneous nodules. Warm and dry Musculoskeletal: Severe joint rigidity not present. No obvious clubbing. No digital petechiae. Mobility: no assist device moves with minimal assistance  Abdomen: Obese Soft. Nondistended. Tenderness at upper abdomen rather vague. No Murphy sign or epigastric pain. No guarding. Marland Kitchen Hernia: Present at: Umbilicus., size 2x2cm. Diastasis recti: Mild supraumbilical midline. No hepatomegaly. No splenomegaly.  Genital/Pelvic: Inguinal hernia: Not present. Inguinal lymph nodes: without lymphadenopathy nor hidradenitis.   Rectal: (Deferred)    ###################################################################  Labs, Imaging and Diagnostic Testing:  Located in 'Care Everywhere' section of Epic EMR chart  PRIOR [facility] CLINIC NOTES:  Not applicable  SURGERY NOTES:  Not applicable  PATHOLOGY:  Located in [place]'  section of Epic EMR chart  Assessment and Plan:  DIAGNOSES:  Diagnoses and all orders for this visit:  Hiatal hernia with gastroesophageal reflux  Esophagitis, erosive  Postoperative  nausea  Dysphagia, unspecified type  BMI 35.0-35.9,adult  Hypersomnia  Uses continuous positive airway pressure (CPAP) ventilation at home  Other 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  He has a moderate size 7 cm hiatal hernia and perhaps larger with evidence of esophagitis and perhaps some metaplasia in the esophagus. My instinct is that he would benefit from hiatal hernia repair. Robotic takedown with primary closure of diaphragmatic defect. He would definitely need mesh reinforcement given the fact that he is immunosuppressed on steroids and is obese. Perhaps Bio-A absorbable patch. Avoid permanent mesh.  The anatomy & physiology of the foregut and anti-reflux mechanism was discussed. The pathophysiology of hiatal herniation and GERD was discussed. Natural history risks without surgery was discussed. The patient's symptoms are not adequately controlled by medicines and other non-operative treatments. I feel the risks of no intervention will lead to serious problems that outweigh the operative risks; therefore, I recommended surgery to reduce the hiatal hernia out of the chest and fundoplication to rebuild the anti-reflux valve and control reflux better. Need for a thorough workup to rule out the differential diagnosis and plan treatment was explained. I explained minimally invasive techniques with possible need for an open approach.  Risks such as bleeding, infection, abscess, leak,injury to other organs, need for repair of tissues / organs, need for further treatment, stroke, heart attack, death, and other risks were discussed. I noted a good likelihood this will help address the problem. Goals of post-operative  recovery were discussed as well. Possibility that this will not correct all symptoms was explained. Post-operative dysphagia, need for short-term liquid & pureed diet, inability to vomit, possibility of reherniation, possible need for medicines to help control symptoms in addition to surgery were discussed. We will work to minimize complications. Educational handouts further explaining the pathology, treatment options, and dysphagia diet was given as well. Questions were answered. The patient expresses understanding & wishes to proceed with surgery.    He does have an umbilical hernia. It is small and reducible. Can try and plan repair with wound ports through their although risk of recurrence high in the setting of obesity and steroids. However I need to put a port somewhere so made plan to do it through there just in case.  Continue CPAP per pulmonary.  Cardiac clearance done    Adin Hector, MD, FACS, MASCRS Esophageal, Gastrointestinal & Colorectal Surgery Robotic and Minimally Invasive Surgery  [facility] [place] [address], [place], [place] [address] [phone] Fax [phone] Main  CONTACT INFORMATION:  Weekday (9AM-5PM): Call [facility] main office at [phone]  Weeknight (5PM-9AM) or Weekend/Holiday: Check [email or web address] (password " [email or web address]") for General Surgery [facility] coverage  (Please, do not use SecureChat as it is not reliable communication to reach operating surgeons for immediate patient care given surgeries/outpatient duties/clinic/cross-coverage/off post-call which would lead to a delay in care.  Epic staff messaging available for outptient concerns, but may not be answered for 48 hours or more).

## 2022-05-09 NOTE — Transfer of Care (Signed)
Immediate Anesthesia Transfer of Care Note  Patient: Herbert Morrison  Procedure(s) Performed: ROBOTIC REPAIR OF PARAESOPHAGEAL HIATAL HERNIA WITH FUNDOPLICATION (Abdomen) PRIMARY UMBILICAL HERNIA REPAIR (Abdomen)  Patient Location: PACU  Anesthesia Type:General  Level of Consciousness: awake, alert , and oriented  Airway & Oxygen Therapy: Patient Spontanous Breathing and Patient connected to face mask oxygen  Post-op Assessment: Report given to RN and Post -op Vital signs reviewed and stable  Post vital signs: Reviewed and stable  Last Vitals:  Vitals Value Taken Time  BP 141/71 05/09/22 1256  Temp 36.7 C 05/09/22 1256  Pulse 65 05/09/22 1259  Resp 12 05/09/22 1259  SpO2 92 % 05/09/22 1259  Vitals shown include unvalidated device data.  Last Pain:  Vitals:   05/09/22 1256  TempSrc:   PainSc: 0-No pain         Complications: No notable events documented.

## 2022-05-09 NOTE — Anesthesia Procedure Notes (Signed)
Procedure Name: Intubation Date/Time: 05/09/2022 8:56 AM  Performed by: Gean Maidens, CRNAPre-anesthesia Checklist: Patient identified, Emergency Drugs available, Patient being monitored, Suction available and Timeout performed Patient Re-evaluated:Patient Re-evaluated prior to induction Oxygen Delivery Method: Circle system utilized Preoxygenation: Pre-oxygenation with 100% oxygen Induction Type: IV induction Ventilation: Mask ventilation without difficulty Laryngoscope Size: Mac and 4 Grade View: Grade II Tube type: Oral Tube size: 7.5 mm Number of attempts: 1 Airway Equipment and Method: Stylet Placement Confirmation: ETT inserted through vocal cords under direct vision, positive ETCO2 and breath sounds checked- equal and bilateral Secured at: 23 cm Tube secured with: Tape Dental Injury: Teeth and Oropharynx as per pre-operative assessment

## 2022-05-09 NOTE — Anesthesia Postprocedure Evaluation (Signed)
Anesthesia Post Note  Patient: [name]  Procedure(s) Performed: ROBOTIC REPAIR OF PARAESOPHAGEAL HIATAL HERNIA WITH FUNDOPLICATION (Abdomen) PRIMARY UMBILICAL HERNIA REPAIR (Abdomen)     Patient location during evaluation: PACU Anesthesia Type: General Level of consciousness: awake and alert Pain management: pain level controlled Vital Signs Assessment: post-procedure vital signs reviewed and stable Respiratory status: spontaneous breathing, nonlabored ventilation, respiratory function stable and patient connected to nasal cannula oxygen Cardiovascular status: blood pressure returned to baseline and stable Postop Assessment: no apparent nausea or vomiting Anesthetic complications: no  No notable events documented.  Last Vitals:  Vitals:   05/09/22 1315 05/09/22 1330  BP: (!) 142/74 116/66  Pulse: 68 64  Resp: 13 14  Temp:    SpO2: 93% 93%    Last Pain:  Vitals:   05/09/22 1330  TempSrc:   PainSc: 0-No pain                 Herbert Morrison

## 2022-05-10 ENCOUNTER — Inpatient Hospital Stay (HOSPITAL_COMMUNITY): Payer: Medicare Other

## 2022-05-10 MED ORDER — KETOROLAC TROMETHAMINE 15 MG/ML IJ SOLN
15.0000 mg | Freq: Once | INTRAMUSCULAR | Status: AC
  Administered 2022-05-10: 15 mg via INTRAVENOUS
  Filled 2022-05-10: qty 1

## 2022-05-10 MED ORDER — IOHEXOL 300 MG/ML  SOLN
75.0000 mL | Freq: Once | INTRAMUSCULAR | Status: AC | PRN
  Administered 2022-05-10: 75 mL via ORAL

## 2022-05-10 MED ORDER — METHOCARBAMOL 1000 MG/10ML IJ SOLN
1000.0000 mg | Freq: Three times a day (TID) | INTRAVENOUS | Status: DC
  Administered 2022-05-10 – 2022-05-11 (×3): 1000 mg via INTRAVENOUS
  Filled 2022-05-10: qty 1000
  Filled 2022-05-10: qty 10
  Filled 2022-05-10: qty 1000
  Filled 2022-05-10: qty 10
  Filled 2022-05-10: qty 1000

## 2022-05-10 NOTE — Evaluation (Signed)
Physical Therapy Evaluation Patient Details Name: LENOX BINK MRN: [id number] DOB: 09-20-1945 Today's Date: 05/10/2022  History of Present Illness  Patient is 76 y.o. male  s/p PEH repair, toupet on 05/09/22. 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  Clinical Impression  Patient evaluated by Physical Therapy with no further acute PT needs identified. All education has been completed and the patient has no further questions.  Pt with mild dyspnea after amb ~ 180' however wife reports overall activity tolerance is much improved with his baseline. Discussed activity progression after d/c. Encouraged pt to continue to mobilize with wife.  See below for any follow-up Physical Therapy or equipment needs. PT is signing off. Thank you for this referral.        Recommendations for follow up therapy are one component of a multi-disciplinary discharge planning process, led by the attending physician.  Recommendations may be updated based on patient status, additional functional criteria and insurance authorization.  Follow Up Recommendations No PT follow up      Assistance Recommended at Discharge PRN  Patient can return home with the following  Help with stairs or ramp for entrance    Equipment Recommendations None recommended by PT  Recommendations for Other Services       Functional Status Assessment Patient has not had a recent decline in their functional status     Precautions / Restrictions Precautions Precaution Comments: abdominal incision, drain Restrictions Weight Bearing Restrictions: No      Mobility  Bed Mobility Overal bed mobility: Modified Independent             General bed mobility comments: bed flat, incr time, light useof rail to come to sit    Transfers Overall transfer level: Modified independent   Transfers: Sit to/from Stand Sit to Stand: Modified independent (Device/Increase time)                 Ambulation/Gait Ambulation/Gait assistance: Supervision Gait Distance (Feet): 240 Feet (15' more) Assistive device: None Gait Pattern/deviations: Step-through pattern, Decreased stride length       General Gait Details: no overt LOB, occasional rail touch in hallway; mild dyspnea after ~ 180'  Stairs            Wheelchair Mobility    Modified Rankin (Stroke Patients Only)       Balance Overall balance assessment: Mild deficits observed, not formally tested                                           Pertinent Vitals/Pain Pain Assessment Pain Assessment: Faces Faces Pain Scale: Hurts a little bit Pain Location: abdomen Pain Descriptors / Indicators: Sore Pain Intervention(s): Monitored during session    Home Living Family/patient expects to be discharged to:: Private residence Living Arrangements: Spouse/significant other Available Help at Discharge: Family;Available 24 hours/day Type of Home: House Home Access: Stairs to enter   [employer] of Steps: 3 steps or 1 depending on door Alternate Level Stairs-Number of Steps: Pt's son lives upstairs Home Layout: Two level;Able to live on main level with bedroom/bathroom Home Equipment: Rollator (4 wheels);Cane - single Barista (2 wheels)      Prior Function Prior Level of Function : Independent/Modified Independent             Mobility Comments: decreased activity tolerance prior  to surgery due to dyspnea       Hand Dominance   Dominant Hand: Right    Extremity/Trunk Assessment   Upper Extremity Assessment Upper Extremity Assessment: Overall WFL for tasks assessed    Lower Extremity Assessment Lower Extremity Assessment: Overall WFL for tasks assessed    Cervical / Trunk Assessment Cervical / Trunk Assessment: Normal  Communication   Communication: No difficulties  Cognition Arousal/Alertness: Awake/alert Behavior During Therapy:  WFL for tasks assessed/performed Overall Cognitive Status: Within Functional Limits for tasks assessed                                          General Comments      Exercises     Assessment/Plan    PT Assessment Patient does not need any further PT services  PT Problem List         PT Treatment Interventions      PT Goals (Current goals can be found in the Care Plan section)  Acute Rehab PT Goals PT Goal Formulation: All assessment and education complete, DC therapy    Frequency       Co-evaluation               AM-PAC PT "6 Clicks" Mobility  Outcome Measure Help needed turning from your back to your side while in a flat bed without using bedrails?: None Help needed moving from lying on your back to sitting on the side of a flat bed without using bedrails?: None Help needed moving to and from a bed to a chair (including a wheelchair)?: None Help needed standing up from a chair using your arms (e.g., wheelchair or bedside chair)?: None Help needed to walk in hospital room?: None Help needed climbing 3-5 steps with a railing? : A Little 6 Click Score: 23    End of Session Equipment Utilized During Treatment: Gait belt Activity Tolerance: Patient tolerated treatment well Patient left: in bed;with call bell/phone within reach;with family/visitor present   PT Visit Diagnosis: Other abnormalities of gait and mobility (R26.89);Difficulty in walking, not elsewhere classified (R26.2)    Time: 1534-1550 PT Time Calculation (min) (ACUTE ONLY): 16 min   Charges:   PT Evaluation $PT Eval Low Complexity: Watkins, PT  Acute Rehab Dept [facility]) [phone]  [facility] Weekend Pager [facility] only)  [phone]  05/10/2022   [facility] 05/10/2022, 3:57 PM

## 2022-05-10 NOTE — Evaluation (Signed)
Occupational Therapy Evaluation Patient Details Name: Herbert Morrison MRN: [id number] DOB: September 04, 1945 Today's Date: 05/10/2022   History of Present Illness Patient is 76 y.o. 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 Patient POD 1 PEH repair, toupet   Clinical Impression   [name] is a 76 year old man who presents s/p abdominal surgery. On evaluation he is sore but able to perform functional transfers well. He needs assistance for LB ADLs and has assistance of family at discharge. Wife reports improved activity tolerance since surgery. Patient has shower chair and LHR if needed as well. No further OT needs.       Recommendations for follow up therapy are one component of a multi-disciplinary discharge planning process, led by the attending physician.  Recommendations may be updated based on patient status, additional functional criteria and insurance authorization.   Follow Up Recommendations  No OT follow up     Assistance Recommended at Discharge Intermittent Supervision/Assistance  Patient can return home with the following A little help with bathing/dressing/bathroom;Assistance with cooking/housework    Functional Status Assessment  Patient has had a recent decline in their functional status and demonstrates the ability to make significant improvements in function in a reasonable and predictable amount of time.  Equipment Recommendations  None recommended by OT    Recommendations for Other Services       Precautions / Restrictions Precautions Precaution Comments: abdominal incision, drain Restrictions Weight Bearing Restrictions: No      Mobility Bed Mobility               General bed mobility comments: up in chair    Transfers Overall transfer level: Needs assistance                 General transfer comment: Supervision for standing and transfers.       Balance Overall balance assessment: Mild deficits observed, not formally tested                                         ADL either performed or assessed with clinical judgement   ADL Overall ADL's : Needs assistance/impaired Eating/Feeding: Independent   Grooming: Independent   Upper Body Bathing: Set up;Sitting   Lower Body Bathing: Minimal assistance;Sit to/from stand   Upper Body Dressing : Set up;Sitting   Lower Body Dressing: Moderate assistance;Sit to/from stand   Toilet Transfer: Supervision/safety   Toileting- Water quality scientist and Hygiene: Supervision/safety       Functional mobility during ADLs: Supervision/safety       Vision Patient Visual Report: No change from baseline       Perception     Praxis      Pertinent Vitals/Pain Pain Assessment Pain Assessment: Faces Faces Pain Scale: Hurts a little bit Pain Location: abdomen Pain Descriptors / Indicators: Sore Pain Intervention(s): Monitored during session     Hand Dominance Right   Extremity/Trunk Assessment Upper Extremity Assessment Upper Extremity Assessment: Overall WFL for tasks assessed   Lower Extremity Assessment Lower Extremity Assessment: Defer to PT evaluation   Cervical / Trunk Assessment Cervical / Trunk Assessment: Normal   Communication Communication Communication: No difficulties   Cognition Arousal/Alertness: Awake/alert Behavior During Therapy: WFL for tasks assessed/performed Overall Cognitive Status: Within Functional Limits for tasks assessed  General Comments       Exercises     Shoulder Instructions      Home Living Family/patient expects to be discharged to:: Private residence Living Arrangements: Spouse/significant other Available Help at Discharge: Family;Available 24 hours/day Type of Home: House Home Access: Stairs to enter [employer] of Steps: 3 steps or 1  depending on dor   Home Layout: Two level;Able to live on main level with bedroom/bathroom Alternate Level Stairs-Number of Steps: Pt's son lives upstairs   Bathroom Shower/Tub: Occupational psychologist: Standard     Home Equipment: Rollator (4 wheels);Cane - single point          Prior Functioning/Environment Prior Level of Function : Independent/Modified Independent             Mobility Comments: decreased activity tolerance prior to surgery due to [facility]          OT Problem List: Decreased activity tolerance;Pain      OT Treatment/Interventions:      OT Goals(Current goals can be found in the care plan section) Acute Rehab OT Goals OT Goal Formulation: All assessment and education complete, DC therapy  OT Frequency:      Co-evaluation              AM-PAC OT "6 Clicks" Daily Activity     Outcome Measure Help from another person eating meals?: None Help from another person taking care of personal grooming?: None Help from another person toileting, which includes using toliet, bedpan, or urinal?: None Help from another person bathing (including washing, rinsing, drying)?: A Little Help from another person to put on and taking off regular upper body clothing?: None Help from another person to put on and taking off regular lower body clothing?: A Lot 6 Click Score: 21   End of Session    Activity Tolerance: Patient tolerated treatment well Patient left:  (left in care of tech to radiology)  OT Visit Diagnosis: Pain                Time: 7829-5621 OT Time Calculation (min): 16 min Charges:  OT General Charges $OT Visit: 1 Visit OT Evaluation $OT Eval Low Complexity: 1 Low  Gustavo Lah, OTR/L Acute Care Rehab Services  Office [phone]   Lenward Chancellor 05/10/2022, 12:32 PM

## 2022-05-10 NOTE — Progress Notes (Signed)
1 Day Post-Op   Subjective/Chief Complaint: A lot of pain, no bowel function, voiding   Objective: Vital signs in last 24 hours: Temp:  [97.5 F (36.4 C)-98.7 F (37.1 C)] 97.5 F (36.4 C) (12/30 0550) Pulse Rate:  [63-79] 71 (12/30 0550) Resp:  [12-16] 16 (12/30 0550) BP: (116-162)/(66-95) 162/86 (12/30 0550) SpO2:  [90 %-95 %] 92 % (12/30 0550) Arterial Line BP: (134-152)/(62-67) 152/67 (12/29 1330) Last BM Date : 05/08/22  Intake/Output from previous day: 12/29 0701 - 12/30 0700 In: 2666.1 [P.O.:480; I.V.:2186.1] Out: 195 [Drains:95; Blood:100] Intake/Output this shift: Total I/O In: -  Out: 5 [Drains:5]  No distress Regular Effort normal Soft approp tender drain serosang  Lab Results:  Recent Labs    05/09/22 1244  HGB 13.9  HCT 41.0   BMET Recent Labs    05/09/22 1244  NA 137  K 4.6   PT/INR No results for input(s): "LABPROT", "INR" in the last 72 hours. ABG Recent Labs    05/09/22 1244  PHART 7.247*  HCO3 22.7    Studies/Results: No results found.  Anti-infectives: Anti-infectives (From admission, onward)    Start     Dose/Rate Route Frequency Ordered Stop   05/09/22 2200  hydroxychloroquine (PLAQUENIL) tablet 200 mg        200 mg Oral 2 times daily 05/09/22 1409     05/09/22 0615  cefTRIAXone (ROCEPHIN) 2 g in sodium chloride 0.9 % 100 mL IVPB  Status:  Discontinued       See Hyperspace for full Linked Orders Report.   2 g 200 mL/hr over 30 Minutes Intravenous On call to O.R. 05/09/22 1224 05/09/22 1359   05/09/22 0615  metroNIDAZOLE (FLAGYL) IVPB 500 mg  Status:  Discontinued       See Hyperspace for full Linked Orders Report.   500 mg 100 mL/hr over 60 Minutes Intravenous On call to O.R. 05/09/22 0614 05/09/22 1359       Assessment/Plan: POD 1 PEH repair, toupet -more pain than would expect but vitals etc all normal and exam not concerning -will check swallow today -will give one dose toradol and make robaxin  scheduled -likely home tomorrow if does well   Rolm Bookbinder 05/10/2022

## 2022-05-11 MED ORDER — HYDRALAZINE HCL 50 MG PO TABS
100.0000 mg | ORAL_TABLET | Freq: Three times a day (TID) | ORAL | Status: DC
  Administered 2022-05-11 – 2022-05-12 (×3): 100 mg via ORAL
  Filled 2022-05-11 (×3): qty 2

## 2022-05-11 MED ORDER — METHOCARBAMOL 1000 MG/10ML IJ SOLN: 1000.0000 mg | Freq: Three times a day (TID) | INTRAVENOUS | Status: DC | PRN

## 2022-05-11 MED ORDER — ONDANSETRON HCL 4 MG/2ML IJ SOLN
4.0000 mg | Freq: Four times a day (QID) | INTRAMUSCULAR | Status: DC
  Administered 2022-05-11 – 2022-05-12 (×3): 4 mg via INTRAVENOUS
  Filled 2022-05-11 (×3): qty 2

## 2022-05-11 MED ORDER — ENSURE ENLIVE PO LIQD
237.0000 mL | Freq: Two times a day (BID) | ORAL | Status: DC
  Administered 2022-05-11 – 2022-05-12 (×2): 237 mL via ORAL

## 2022-05-11 MED ORDER — POLYETHYLENE GLYCOL 3350 17 G PO PACK
17.0000 g | PACK | Freq: Every day | ORAL | Status: DC
  Administered 2022-05-11 – 2022-05-12 (×2): 17 g via ORAL
  Filled 2022-05-11 (×2): qty 1

## 2022-05-11 NOTE — Progress Notes (Signed)
2 Days Post-Op   Subjective/Chief Complaint: Feels much better, no n/v tol clears swallow with some likely edema he states no issues at all, no flatus or bm yet   Objective: Vital signs in last 24 hours: Temp:  [97.5 F (36.4 C)-98.3 F (36.8 C)] 98.1 F (36.7 C) (12/31 0807) Pulse Rate:  [60-67] 62 (12/31 0807) Resp:  [16-18] 18 (12/31 0807) BP: (152-180)/(79-127) 180/98 (12/31 0807) SpO2:  [92 %-94 %] 93 % (12/31 0807) Last BM Date : 05/08/22  Intake/Output from previous day: 12/30 0701 - 12/31 0700 In: 629.9 [P.O.:300; IV Piggyback:329.9] Out: 20 [Drains:20] Intake/Output this shift: Total I/O In: 60 [P.O.:60] Out: -   No distress Regular Effort normal Soft approp tender drain serosang  Lab Results:  Recent Labs    05/09/22 1244  HGB 13.9  HCT 41.0   BMET Recent Labs    05/09/22 1244  NA 137  K 4.6   PT/INR No results for input(s): "LABPROT", "INR" in the last 72 hours. ABG Recent Labs    05/09/22 1244  PHART 7.247*  HCO3 22.7    Studies/Results: DG ESOPHAGUS W SINGLE CM (SOL OR THIN BA)  Result Date: 05/10/2022 CLINICAL DATA:  Patient s/p Toupet fundoplication 99/37/16. Request for esophagram to rule out post operative leak. EXAM: ESOPHAGUS/BARIUM SWALLOW/TABLET STUDY TECHNIQUE: Single contrast examination was performed using water soluble contrast. This exam was performed by Candiss Norse, PA-C, and was supervised and interpreted by Hassan Rowan, MD. FLUOROSCOPY: Radiation Exposure Index (as provided by the fluoroscopic device): 46.30 mGy Kerma COMPARISON:  None Available. FINDINGS: Swallowing: Not assessed on limited exam. Pharynx: Not assessed on limited exam. Esophagus: Significant non-occlusive narrowing of the gastroesophageal junction/fundoplication potentially related to post operative edema. No contrast extravasation seen in standing AP, LPO, RPO positions. Esophageal motility: Multiple tertiary contractions noted with stasis of contrast in the  esophagus up to the level of the clavicle for approximately 5 minutes. This was cleared completely after multiple sips of water. Hiatal Hernia: None. Gastroesophageal reflux: None visualized on limited exam. Ingested 13 mm barium tablet: Not given Other: Exam performed in standing AP/LPO/RPO as patient is unable to lay flat due to pain. IMPRESSION: Single contrast esophagram notable for narrowing of the GE junction/fundoplication with transient holdup of contrast, likely due to post operative edema. No post operative leak seen. Tertiary contractions/nonspecific motility disorder. Electronically Signed   By: Margarette Canada M.D.   On: 05/10/2022 10:38    Anti-infectives: Anti-infectives (From admission, onward)    Start     Dose/Rate Route Frequency Ordered Stop   05/09/22 2200  hydroxychloroquine (PLAQUENIL) tablet 200 mg        200 mg Oral 2 times daily 05/09/22 1409     05/09/22 0615  cefTRIAXone (ROCEPHIN) 2 g in sodium chloride 0.9 % 100 mL IVPB  Status:  Discontinued       See Hyperspace for full Linked Orders Report.   2 g 200 mL/hr over 30 Minutes Intravenous On call to O.R. 05/09/22 [id number] 05/09/22 1359   05/09/22 0615  metroNIDAZOLE (FLAGYL) IVPB 500 mg  Status:  Discontinued       See Hyperspace for full Linked Orders Report.   500 mg 100 mL/hr over 60 Minutes Intravenous On call to O.R. 05/09/22 [id number] 05/09/22 1359       Assessment/Plan: POD 2 PEH repair, toupet -swallow as expected, will do fulls today due to slow transit and findings with ensure, if does well with this can go home later -would  like to at least have some bowel function, will do miralax but dont think that will hold up dc  -lovenox, scds  Rolm Bookbinder 05/11/2022

## 2022-05-11 NOTE — Progress Notes (Signed)
Patient ID: Herbert Morrison, male   DOB: June 05, 1945, 76 y.o.   MRN: [id number] Feels full quickly likely related to postop edema on swallow. Clears, scheduled zofran and will give this some more time.

## 2022-05-11 NOTE — Progress Notes (Signed)
  Transition of Care [facility]) Screening Note   Patient Details  Name: Herbert Morrison Date of Birth: [birth year]   Transition of Care [facility]) CM/SW Contact:    Vassie Moselle, LCSW Phone Number: 05/11/2022, 11:50 AM    Transition of Care Department [facility]) has reviewed patient and no TOC needs have been identified at this time. We will continue to monitor patient advancement through interdisciplinary progression rounds. If new patient transition needs arise, please place a TOC consult.

## 2022-05-11 NOTE — Progress Notes (Signed)
Mobility Specialist - Progress Note   05/11/22 1438  Mobility  Activity Ambulated with assistance in hallway  Level of Assistance Contact guard assist, steadying assist  Assistive Device None  Distance Ambulated (ft) 500 ft  Activity Response Tolerated well  Mobility Referral Yes  $Mobility charge 1 Mobility   Pt received in bed and agreeable to mobility. Once standing pt had some LOB that was self corrected. Suggested pt use walker & refused. Pt is contact during ambulation due to refusal of using the walker & not having good balance. No other complaints during mobility session. Pt to bed after session with all needs met & family in room.   [facility]

## 2022-05-12 HISTORY — PX: PARTIAL COLECTOMY: SHX5273

## 2022-05-12 NOTE — Plan of Care (Signed)
Patient is stable for discharge. Discharge instructions have been given. All questions were answered, patient is discharged home with wife.

## 2022-05-12 NOTE — Progress Notes (Signed)
3 Days Post-Op   Subjective/Chief Complaint: Tol liquids, had an issue with grits yesterday as apparently these were pretty solid, breathing better than preop, bp elevated but better.had a bm   Objective: Vital signs in last 24 hours: Temp:  [97.6 F (36.4 C)-98.3 F (36.8 C)] 97.9 F (36.6 C) (01/01 0535) Pulse Rate:  [55-61] 55 (01/01 0535) Resp:  [16-18] 16 (01/01 0535) BP: (160-203)/(96-112) 177/97 (01/01 0535) SpO2:  [94 %-95 %] 94 % (01/01 0535) Last BM Date : 05/12/22  Intake/Output from previous day: 12/31 0701 - 01/01 0700 In: 540 [P.O.:540] Out: 740 [Urine:700; Drains:40] Intake/Output this shift: No intake/output data recorded.  No distress Regular Effort normal Soft approp tender drain serosang  Lab Results:  Recent Labs    05/09/22 1244  HGB 13.9  HCT 41.0   BMET Recent Labs    05/09/22 1244  NA 137  K 4.6   PT/INR No results for input(s): "LABPROT", "INR" in the last 72 hours. ABG Recent Labs    05/09/22 1244  PHART 7.247*  HCO3 22.7    Studies/Results: DG ESOPHAGUS W SINGLE CM (SOL OR THIN BA)  Result Date: 05/10/2022 CLINICAL DATA:  Patient s/p Toupet fundoplication 16/38/46. Request for esophagram to rule out post operative leak. EXAM: ESOPHAGUS/BARIUM SWALLOW/TABLET STUDY TECHNIQUE: Single contrast examination was performed using water soluble contrast. This exam was performed by Candiss Norse, PA-C, and was supervised and interpreted by Hassan Rowan, MD. FLUOROSCOPY: Radiation Exposure Index (as provided by the fluoroscopic device): 46.30 mGy Kerma COMPARISON:  None Available. FINDINGS: Swallowing: Not assessed on limited exam. Pharynx: Not assessed on limited exam. Esophagus: Significant non-occlusive narrowing of the gastroesophageal junction/fundoplication potentially related to post operative edema. No contrast extravasation seen in standing AP, LPO, RPO positions. Esophageal motility: Multiple tertiary contractions noted with stasis of  contrast in the esophagus up to the level of the clavicle for approximately 5 minutes. This was cleared completely after multiple sips of water. Hiatal Hernia: None. Gastroesophageal reflux: None visualized on limited exam. Ingested 13 mm barium tablet: Not given Other: Exam performed in standing AP/LPO/RPO as patient is unable to lay flat due to pain. IMPRESSION: Single contrast esophagram notable for narrowing of the GE junction/fundoplication with transient holdup of contrast, likely due to post operative edema. No post operative leak seen. Tertiary contractions/nonspecific motility disorder. Electronically Signed   By: Margarette Canada M.D.   On: 05/10/2022 10:38    Anti-infectives: Anti-infectives (From admission, onward)    Start     Dose/Rate Route Frequency Ordered Stop   05/09/22 2200  hydroxychloroquine (PLAQUENIL) tablet 200 mg        200 mg Oral 2 times daily 05/09/22 1409     05/09/22 0615  cefTRIAXone (ROCEPHIN) 2 g in sodium chloride 0.9 % 100 mL IVPB  Status:  Discontinued       See Hyperspace for full Linked Orders Report.   2 g 200 mL/hr over 30 Minutes Intravenous On call to O.R. 05/09/22 [id number] 05/09/22 1359   05/09/22 0615  metroNIDAZOLE (FLAGYL) IVPB 500 mg  Status:  Discontinued       See Hyperspace for full Linked Orders Report.   500 mg 100 mL/hr over 60 Minutes Intravenous On call to O.R. 05/09/22 0614 05/09/22 1359       Assessment/Plan: POD 3 PEH repair, toupet -I think still has some edema but as long as does liquids, pureed I think fine to go home today -lovenox, scds   Rolm Bookbinder 05/12/2022

## 2022-05-13 ENCOUNTER — Encounter (HOSPITAL_COMMUNITY): Payer: Self-pay | Admitting: Surgery

## 2022-05-14 NOTE — Discharge Summary (Signed)
Physician Discharge Summary    Patient ID: Herbert Morrison MRN: [id number] DOB/AGE: 75-Aug-1947  77 y.o.  Patient Care Team: Andres Shad, MD as PCP - General (Family Medicine) Sondra Come, Barnie Alderman, MD as Referring Physician (Cardiology) Michael Boston, MD as Consulting Physician (General Surgery) Daryel November, MD as Consulting Physician (Gastroenterology) Maryjane Hurter, MD as Consulting Physician (Pulmonary Disease)  Admit date: 05/09/2022  Discharge date: 05/12/2022 Hospital Stay = 3 days    Discharge Diagnoses:  Principal Problem:   Incarcerated hiatal hernia   5 Days Post-Op  05/09/2022  POST-OPERATIVE DIAGNOSIS:   PARAESOPHAGEAL HIATAL HERNIA REFRACTORY TO MEDICAL MANAGEMENT UMBILICAL HERNIA   PROCEDURE:   1. ROBOTIC reduction of paraesophageal hiatal hernia 2. Type II mediastinal dissection. 3. Primary repair of hiatal hernia over pledgets.  4. Anterior & posterior gastropexy. 5. Toupet (270 degree partial posterior x 4cm)  fundoplication 6. Mesh reinforcement with absorbable mesh (BioA) 7.  Primary umbilical hernia repair 8.  TAP block   SURGEON:  Adin Hector, MD  OR FINDINGS:    Moderate-sized paraesophageal hiatal hernia with 33% of the stomach in the mediastinum.  There was a 9 x 6 cm hiatal defect.   It is a primary repair over pledgets.  Mesh reinforcement was used with GORE BIO-A mesh, a biosynthetic web scaffold made of 32% polyglycolic acid (PGA): 35% trimethylene carbonate (TMC).    The patient has a Toupet (270 degree partial posterior x 4cm)  fundoplication.  The patient has had anterior and posterior gastropexy.  Consults: Case Management / Social Work, Nutrition, and Anesthesia  Hospital Course:   The patient underwent  the surgery above.  Postoperatively, the patient gradually mobilized and advanced to a pureed diet.  Episode of some dysphagia and discomfort with grits but tolerated most dysphagia diet rather well.   Pain and other symptoms were treated aggressively.    By the time of discharge, the patient was walking well the hallways, eating food, having flatus.  Pain was well-controlled on an oral medications.  Based on meeting discharge criteria and continuing to recover, I felt it was safe for the patient to be discharged from the hospital to further recover with close followup. Postoperative recommendations were discussed in detail.  They are written as well.  Discharged Condition: good  Discharge Exam: Blood pressure (!) 177/97, pulse (!) 55, temperature 97.9 F (36.6 C), temperature source Oral, resp. rate 16, height '5\' 5"'$  (1.651 m), weight 100.7 kg, SpO2 94 %.  General: Pt awake/alert/oriented x4 in No acute distress Eyes: PERRL, normal EOM.  Sclera clear.  No icterus Neuro: CN II-XII intact w/o focal sensory/motor deficits. Lymph: No head/neck/groin lymphadenopathy Psych:  No delerium/psychosis/paranoia HENT: Normocephalic, Mucus membranes moist.  No thrush Neck: Supple, No tracheal deviation Chest:  No chest wall pain w good excursion CV:  Pulses intact.  Regular rhythm MS: Normal AROM mjr joints.  No obvious deformity Abdomen: Soft.  Nondistended.  Mildly tender at incisions only.  No evidence of peritonitis.  No incarcerated hernias. Ext:  SCDs BLE.  No mjr edema.  No cyanosis Skin: No petechiae / purpura   Disposition:    Follow-up Information     Michael Boston, MD. Schedule an appointment as soon as possible for a visit on 05/27/2022.   Specialties: General Surgery, Colon and Rectal Surgery Why: To follow up after your operation Contact information: [address] [place] [address] [phone]  Discharge disposition: 01-Home or Self Care       Discharge Instructions     Call MD for:   Complete by: As directed    Temperature > 101.97F   Call MD for:  extreme fatigue   Complete by: As directed    Call MD for:  hives   Complete  by: As directed    Call MD for:  persistant nausea and vomiting   Complete by: As directed    Call MD for:  redness, tenderness, or signs of infection (pain, swelling, redness, odor or green/yellow discharge around incision site)   Complete by: As directed    Call MD for:  severe uncontrolled pain   Complete by: As directed    Diet general   Complete by: As directed    SEE ESOPHAGEAL SURGERY DIET INSTRUCTIONS  We using usually start you out on a pureed (blenderized) diet. Expect some sticking with swallowing over the next 1-2 months.   This is due to swelling around your esophagus at the wrap & hiatal diaphragm repair.  It will gradually ease off over the next few months.   Discharge instructions   Complete by: As directed    Please see discharge instruction sheets.   Also refer to any handouts/printouts that may have been given from the [facility] surgery office (if you visited [place] there before surgery) Please call our office if you have any questions or concerns [phone]   Driving Restrictions   Complete by: As directed    No driving until off narcotics and can safely swerve away without pain during an emergency   Increase activity slowly   Complete by: As directed    Lifting restrictions   Complete by: As directed    Avoid heavy lifting initially, <20 pounds at first.   Do not push through pain.   You have no specific weight limit: If it hurts to do, DON'T DO IT.    If you feel no pain, you are not injuring anything.  Pain will protect you from injury.   Coughing and sneezing are far more stressful to your incision than any lifting.   Avoid resuming heavy lifting (>50 pounds) or other intense activity until off all narcotic pain medications.   When want to exercise more, give yourself 2 weeks to gradually get back to full intense exercise/activity.   May shower / Bathe   Complete by: As directed    [place].  It is fine for dressings or wounds to be washed/rinsed.  Use  gentle soap & water.  This will help the incisions and/or wounds get clean & minimize infection.   May walk up steps   Complete by: As directed    Remove dressing in 72 hours   Complete by: As directed    You have closed incisions: Shower and bathe over these incisions with soap and water every day.  It is OK to wash over the dressings: they are waterproof. Remove all surgical dressings on postoperative day #3.  You do not need to replace dressings over the closed incisions unless you feel more comfortable with a Band-Aid covering it.   TEGADERM:  You have clear gauze band-aid dressings over your closed incision(s).  Remove the dressings 3 days after surgery = 05/12/2021 New Year's Day  Please call our office [phone] if you have further questions.   Sexual Activity Restrictions   Complete by: As directed    Sexual activity as tolerated.  Do not push  through pain.  Pain will protect you from injury.   Walk with assistance   Complete by: As directed    Walk over an hour a day.  May use a walker/cane/companion to help with balance and stamina.       Allergies as of 05/12/2022       Reactions   Remicade [infliximab] Anaphylaxis   Erythromycin Nausea Only   Grass Pollen(k-o-r-t-swt Vern) Other (See Comments)   Seasonal allergies   Pollen Extract Other (See Comments)   Sulfasalazine Nausea And Vomiting        Medication List     TAKE these medications    amLODipine-valsartan 10-320 MG tablet Commonly known as: EXFORGE Take 1 tablet by mouth in the morning.   B-12 SL Take 1 tablet by mouth in the morning.   buPROPion 300 MG 24 hr tablet Commonly known as: WELLBUTRIN XL Take 300 mg by mouth in the morning.   cyclobenzaprine 10 MG tablet Commonly known as: FLEXERIL Take 10 mg by mouth 3 (three) times daily as needed for muscle spasms.   escitalopram 20 MG tablet Commonly known as: LEXAPRO Take 20 mg by mouth in the morning.   fluticasone 50 MCG/ACT nasal  spray Commonly known as: FLONASE Place 1 spray into both nostrils daily.   hydrALAZINE 50 MG tablet Commonly known as: APRESOLINE Take 50 mg by mouth in the morning and at bedtime.   hydroxychloroquine 200 MG tablet Commonly known as: PLAQUENIL Take 200 mg by mouth 2 (two) times daily.   ibuprofen 200 MG tablet Commonly known as: ADVIL Take 600 mg by mouth daily as needed for mild pain (Joint inflammation).   ketoconazole 2 % shampoo Commonly known as: NIZORAL Apply 1 Application topically every other day. Every other shower   LORazepam 2 MG tablet Commonly known as: ATIVAN Take 2 mg by mouth in the morning and at bedtime.   metoprolol succinate 50 MG 24 hr tablet Commonly known as: TOPROL-XL Take 50 mg by mouth in the morning. Take with or immediately following a meal.   omeprazole 20 MG capsule Commonly known as: PRILOSEC Take 20 mg by mouth in the morning.   ondansetron 4 MG tablet Commonly known as: ZOFRAN Take 1 tablet (4 mg total) by mouth every 8 (eight) hours as needed for nausea.   predniSONE 5 MG tablet Commonly known as: DELTASONE Take 10 mg by mouth in the morning.   traMADol 50 MG tablet Commonly known as: ULTRAM Take 1-2 tablets (50-100 mg total) by mouth every 6 (six) hours as needed for moderate pain or severe pain.   VITAMIN D-3 PO Take 3 tablets by mouth in the morning.   zinc gluconate 50 MG tablet Take 50 mg by mouth in the morning.        Significant Diagnostic Studies:  No results found for this or any previous visit (from the past 72 hour(s)).  DG ESOPHAGUS W SINGLE CM (SOL OR THIN BA)  Result Date: 05/10/2022 CLINICAL DATA:  Patient s/p Toupet fundoplication 60/63/01. Request for esophagram to rule out post operative leak. EXAM: ESOPHAGUS/BARIUM SWALLOW/TABLET STUDY TECHNIQUE: Single contrast examination was performed using water soluble contrast. This exam was performed by Candiss Norse, PA-C, and was supervised and interpreted  by Hassan Rowan, MD. FLUOROSCOPY: Radiation Exposure Index (as provided by the fluoroscopic device): 46.30 mGy Kerma COMPARISON:  None Available. FINDINGS: Swallowing: Not assessed on limited exam. Pharynx: Not assessed on limited exam. Esophagus: Significant non-occlusive narrowing of the gastroesophageal junction/fundoplication potentially  related to post operative edema. No contrast extravasation seen in standing AP, LPO, RPO positions. Esophageal motility: Multiple tertiary contractions noted with stasis of contrast in the esophagus up to the level of the clavicle for approximately 5 minutes. This was cleared completely after multiple sips of water. Hiatal Hernia: None. Gastroesophageal reflux: None visualized on limited exam. Ingested 13 mm barium tablet: Not given Other: Exam performed in standing AP/LPO/RPO as patient is unable to lay flat due to pain. IMPRESSION: Single contrast esophagram notable for narrowing of the GE junction/fundoplication with transient holdup of contrast, likely due to post operative edema. No post operative leak seen. Tertiary contractions/nonspecific motility disorder. Electronically Signed   By: Margarette Canada M.D.   On: 05/10/2022 10:38    Past Medical History:  Diagnosis Date   Anxiety    Arthritis    Cancer ([place])    skin cancer removed Left shoulder Basal cell   Depression    Dyspnea    GERD (gastroesophageal reflux disease)    Heart murmur    as a child   Hiatal hernia    History of hiatal hernia    Hypertension    Lupus ([place]) 12/31/2020   Rheumatoid arthritis ([place]) 12/31/2020   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[facility])     Past Surgical History:  Procedure Laterality Date   BIOPSY  01/30/2021   Procedure: BIOPSY;  Surgeon: Rogene Houston, MD;  [facility];  Service: Endoscopy;;   CATARACT EXTRACTION Left 2023   COLONOSCOPY N/A 04/09/2016   Procedure: COLONOSCOPY;  Surgeon: Rogene Houston, MD;  [facility];  Service: Endoscopy;  Laterality:  N/A;  1:45   COLONOSCOPY WITH PROPOFOL N/A 01/30/2021   Procedure: COLONOSCOPY WITH PROPOFOL;  Surgeon: Rogene Houston, MD;  [facility];  Service: Endoscopy;  Laterality: N/A;   ELBOW BURSA SURGERY Left 1983   ESOPHAGOGASTRODUODENOSCOPY (EGD) WITH PROPOFOL N/A 01/30/2021   Procedure: ESOPHAGOGASTRODUODENOSCOPY (EGD) WITH PROPOFOL;  Surgeon: Rogene Houston, MD;  [facility];  Service: Endoscopy;  Laterality: N/A;  2:00, pt knows to arrive at 11:00   KNEE ARTHROSCOPY Right 1979   left elbow     spur removed   left knee surgery     open surgery for a meniscus tear 1970's   POLYPECTOMY  04/09/2016   Procedure: POLYPECTOMY;  Surgeon: Rogene Houston, MD;  [facility];  Service: Endoscopy;;  cecal polyp   POLYPECTOMY  01/30/2021   Procedure: POLYPECTOMY;  Surgeon: Rogene Houston, MD;  [facility];  Service: Endoscopy;;   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   Procedure: LEFT TOTAL HIP ARTHROPLASTY ANTERIOR APPROACH;  Surgeon: Paralee Cancel, MD;  [facility];  Service: Orthopedics;  Laterality: Left;  70 mins   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   Procedure: TOTAL KNEE ARTHROPLASTY;  Surgeon: Paralee Cancel, MD;  [facility];  Service: Orthopedics;  Laterality: Left;  70 mins   UMBILICAL HERNIA REPAIR N/A 05/09/2022   Procedure: PRIMARY UMBILICAL HERNIA REPAIR;  Surgeon: Michael Boston, MD;  Location[facility];  Service: General;  Laterality: N/A;   XI ROBOTIC ASSISTED PARAESOPHAGEAL HERNIA REPAIR N/A 05/09/2022   Procedure: ROBOTIC REPAIR OF PARAESOPHAGEAL HIATAL HERNIA WITH FUNDOPLICATION;  Surgeon: Michael Boston, MD;  Location[facility];  Service: General;  Laterality: N/A;  GEN w/ERAS LOCAL    Social History   Socioeconomic History   Marital status: Married    Spouse name: Not on file  Number of children: Not on file   Years of education: Not on file   Highest education level: Not on file  Occupational History   Not on  file  Tobacco Use   Smoking status: Former    Packs/day: 1.00    Years: 5.00    Total pack years: 5.00    Types: Cigarettes    Quit date: 70    Years since quitting: 49.0   Smokeless tobacco: Former    Types: Chew    Quit date: 01/22/1965   Tobacco comments:    quit 43 years ago  Vaping Use   Vaping Use: Never used  Substance and Sexual Activity   Alcohol use: Not Currently    Comment: occasionally   Drug use: No   Sexual activity: Yes  Other Topics Concern   Not on file  Social History Narrative   Not on file   Social Determinants of Health   Financial Resource Strain: Not on file  Food Insecurity: No Food Insecurity (05/10/2022)   Hunger Vital Sign    Worried About Running Out of Food in the Last Year: Never true    Ran Out of Food in the Last Year: Never true  Transportation Needs: No Transportation Needs (05/10/2022)   PRAPARE - Hydrologist (Medical): No    Lack of Transportation (Non-Medical): No  Physical Activity: Not on file  Stress: Not on file  Social Connections: Not on file  Intimate Partner Violence: Not At Risk (05/10/2022)   Humiliation, Afraid, Rape, and Kick questionnaire    Fear of Current or Ex-Partner: No    Emotionally Abused: No    Physically Abused: No    Sexually Abused: No    Family History  Problem Relation Age of Onset   Leukemia Father    Bladder Cancer Father    Stomach cancer Neg Hx    Esophageal cancer Neg Hx    Colon cancer Neg Hx    Pancreatic cancer Neg Hx     No current facility-administered medications for this encounter.   Current Outpatient Medications  Medication Sig Dispense Refill   amLODipine-valsartan (EXFORGE) 10-320 MG tablet Take 1 tablet by mouth in the morning.     buPROPion (WELLBUTRIN XL) 300 MG 24 hr tablet Take 300 mg by mouth in the morning.     Cholecalciferol (VITAMIN D-3 PO) Take 3 tablets by mouth in the morning.     Cyanocobalamin (B-12 SL) Take 1 tablet by mouth in  the morning.     cyclobenzaprine (FLEXERIL) 10 MG tablet Take 10 mg by mouth 3 (three) times daily as needed for muscle spasms.     escitalopram (LEXAPRO) 20 MG tablet Take 20 mg by mouth in the morning.     fluticasone (FLONASE) 50 MCG/ACT nasal spray Place 1 spray into both nostrils daily.     hydrALAZINE (APRESOLINE) 50 MG tablet Take 50 mg by mouth in the morning and at bedtime.     hydroxychloroquine (PLAQUENIL) 200 MG tablet Take 200 mg by mouth 2 (two) times daily.     ibuprofen (ADVIL) 200 MG tablet Take 600 mg by mouth daily as needed for mild pain (Joint inflammation).     ketoconazole (NIZORAL) 2 % shampoo Apply 1 Application topically every other day. Every other shower     LORazepam (ATIVAN) 2 MG tablet Take 2 mg by mouth in the morning and at bedtime.     metoprolol succinate (TOPROL-XL) 50 MG 24 hr  tablet Take 50 mg by mouth in the morning. Take with or immediately following a meal.     omeprazole (PRILOSEC) 20 MG capsule Take 20 mg by mouth in the morning.     ondansetron (ZOFRAN) 4 MG tablet Take 1 tablet (4 mg total) by mouth every 8 (eight) hours as needed for nausea. 8 tablet 10   predniSONE (DELTASONE) 5 MG tablet Take 10 mg by mouth in the morning.     traMADol (ULTRAM) 50 MG tablet Take 1-2 tablets (50-100 mg total) by mouth every 6 (six) hours as needed for moderate pain or severe pain. 20 tablet 0   zinc gluconate 50 MG tablet Take 50 mg by mouth in the morning.       Allergies  Allergen Reactions   Remicade [Infliximab] Anaphylaxis   Erythromycin Nausea Only   Grass Pollen(K-O-R-T-Swt Vern) Other (See Comments)    Seasonal allergies    Pollen Extract Other (See Comments)   Sulfasalazine Nausea And Vomiting    Signed:   Adin Hector, MD, FACS, MASCRS Esophageal, Gastrointestinal & Colorectal Surgery Robotic and Minimally Invasive Surgery  [facility] [place] [address], [place], [place]  [address] [phone] Fax [phone] Main  CONTACT INFORMATION:  Weekday (9AM-5PM): Call [facility] main office at [phone]  Weeknight (5PM-9AM) or Weekend/Holiday: Check [email or web address] (password " [email or web address]") for General Surgery [facility] coverage  (Please, do not use SecureChat as it is not reliable communication to reach operating surgeons for immediate patient care given surgeries/outpatient duties/clinic/cross-coverage/off post-call which would lead to a delay in care.  Epic staff messaging available for outptient concerns, but may not be answered for 48 hours or more).     05/14/2022, 8:43 AM

## 2022-06-11 IMAGING — CT CT HEAD W/O CM
3 of 4 series · 15 of 47 positions shown, 18 images · non-contrast
Comparison: None.

CLINICAL DATA: Headache.  Hypertension.



[Series 2: head w o · axial · 0.41mm/px · z∈[+276,+401]mm · 9 of 31 slices shown, 12 images]
[im 3/31  brain]
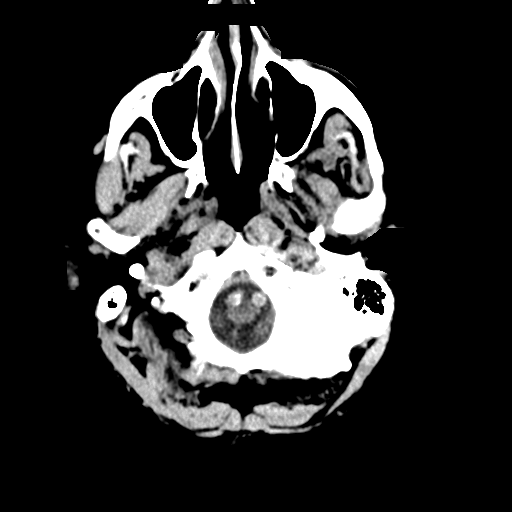
[im 3/31  bone]
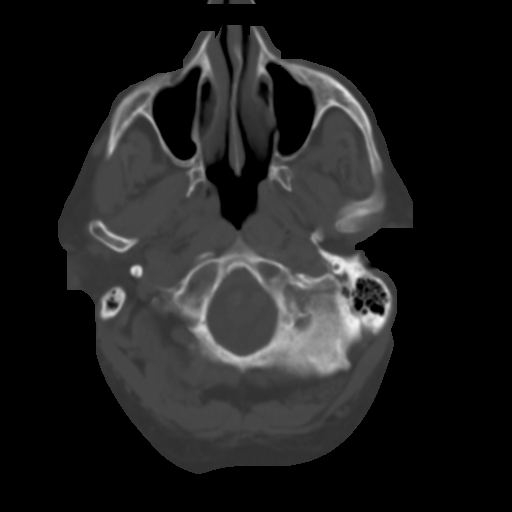
[im 7/31  brain]
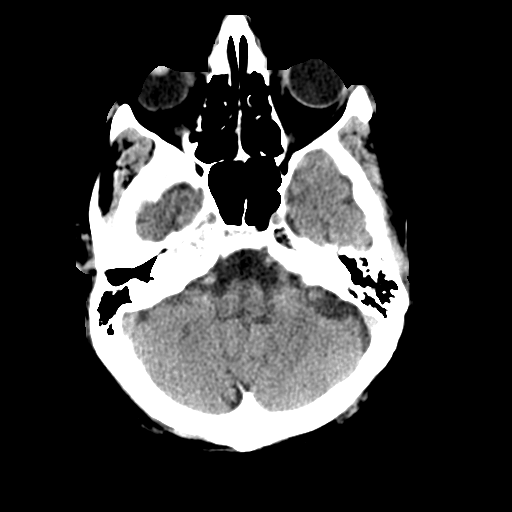
[im 9/31  brain]
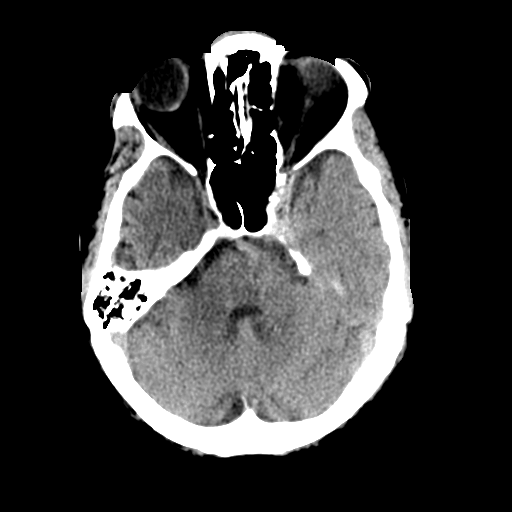
[im 13/31  brain]
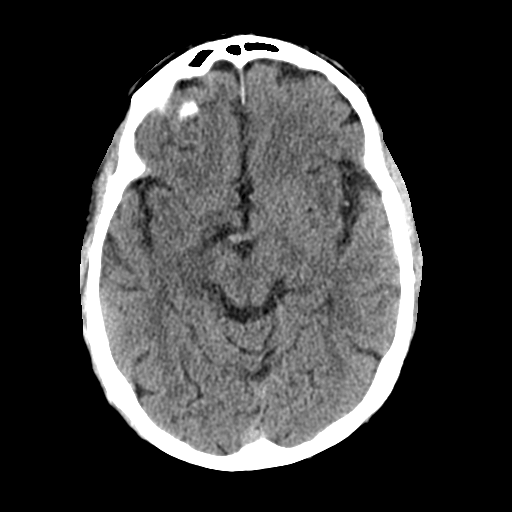
[im 16/31  brain]
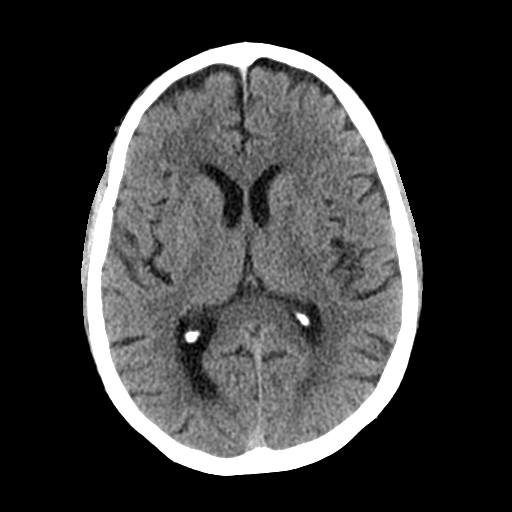
[im 16/31  bone]
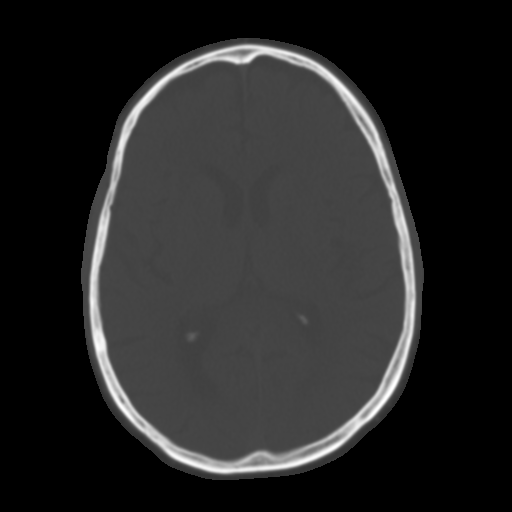
[im 18/31  brain]
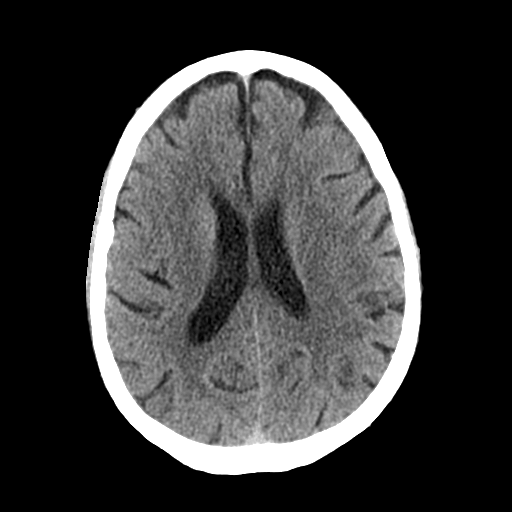
[im 22/31  brain]
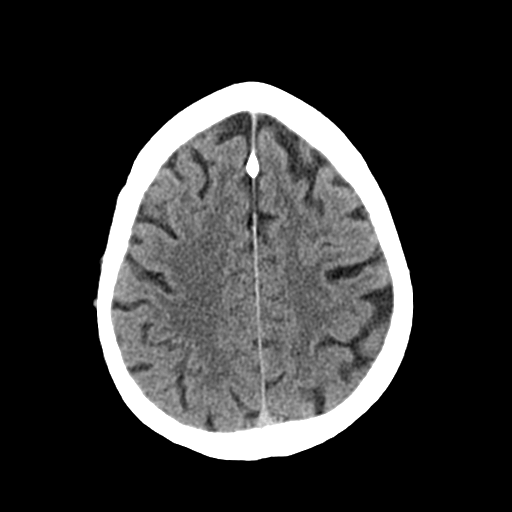
[im 24/31  brain]
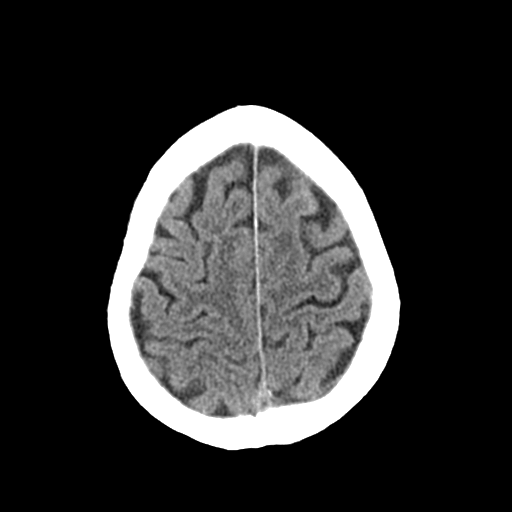
[im 28/31  brain]
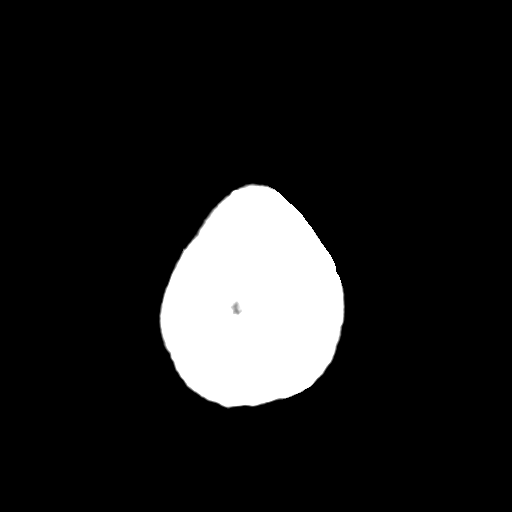
[im 28/31  bone]
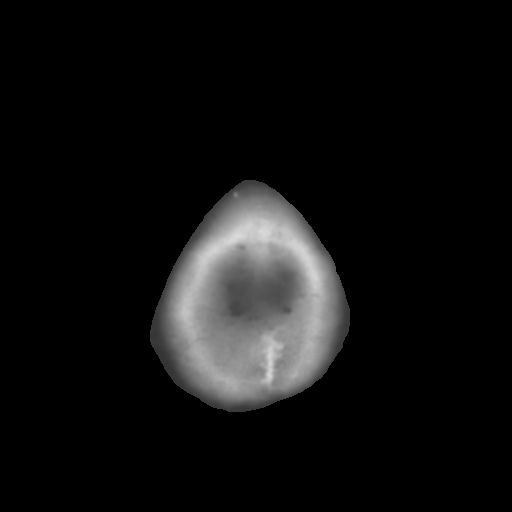

[Series 4: coronal soft · coronal · 0.32mm/px · 3 of 70 slices shown]
[im 24/70  brain]
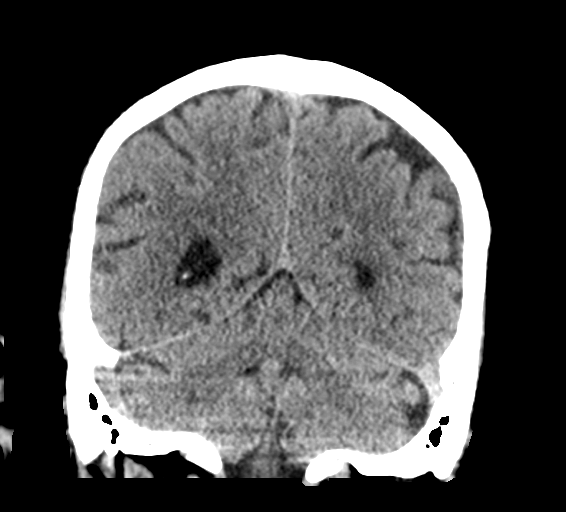
[im 31/70  brain]
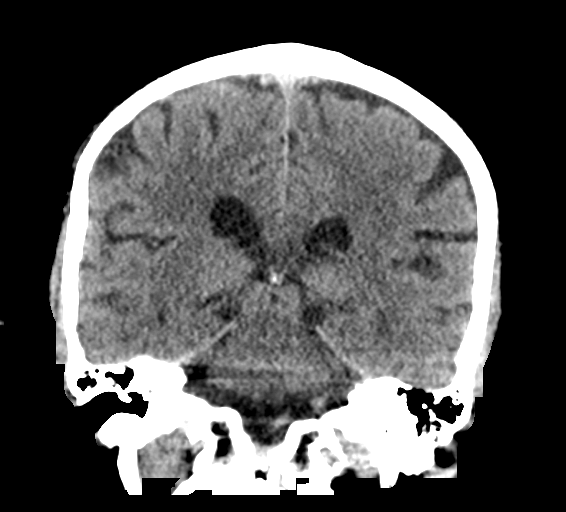
[im 39/70  brain]
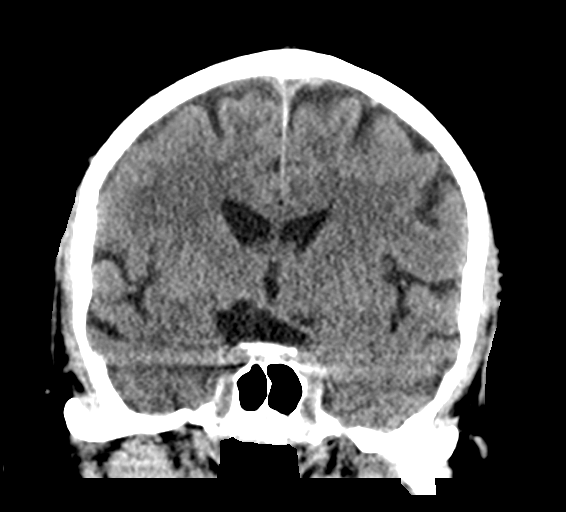

[Series 5: sagittal soft · sagittal · 0.31mm/px · 3 of 54 slices shown]
[im 18/54  brain]
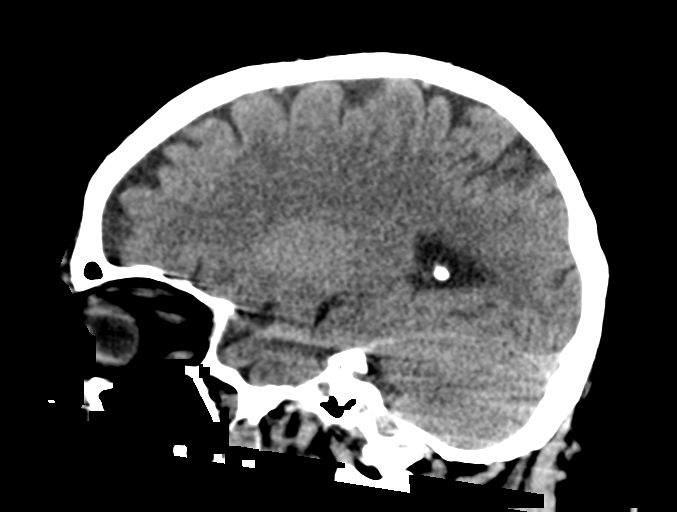
[im 27/54  brain]
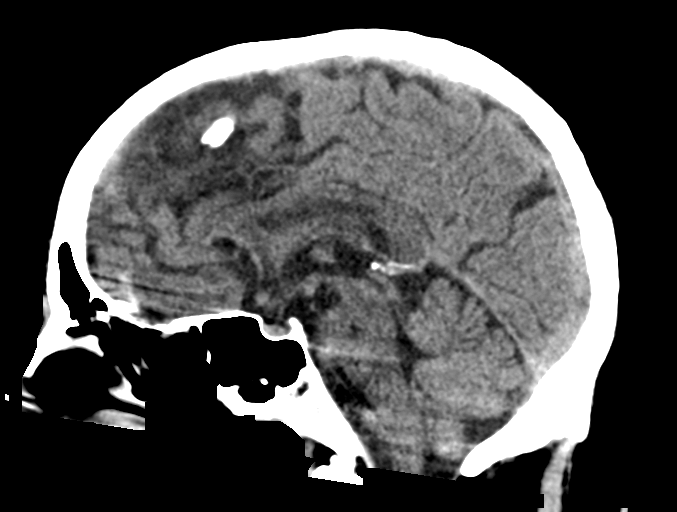
[im 36/54  brain]
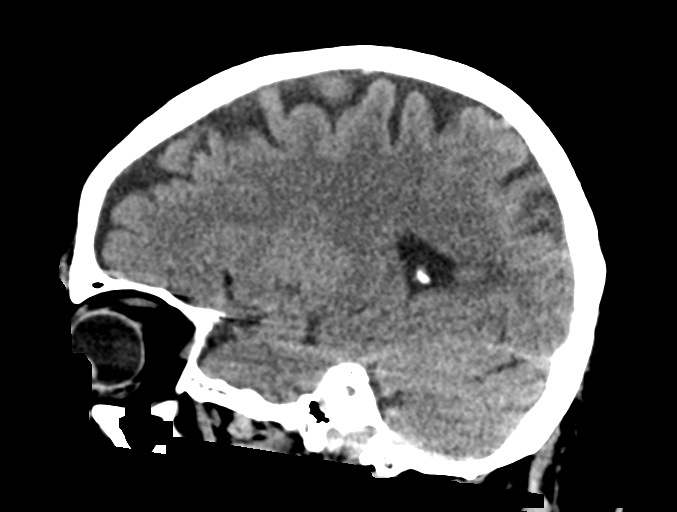

[15 of 47 positions shown; findings below may reference images not displayed]

FINDINGS: Brain: The ventricles are normal in size and configuration. The
basilar cisterns are patent.

No mass, mass effect, or midline shift.

No acute intracranial hemorrhage is seen.

No abnormal extra-axial fluid collection.

Mild periventricular white matter hypodensities nonspecific but most
likely secondary to chronic ischemic white matter changes.

Preservation of the normal cortical gray-white interface without CT
evidence of an acute major vascular territorial cortical based
infarction.

Vascular: No hyperdense vessel or unexpected calcification.

Skull: Normal. Negative for fracture or focal lesion.

Sinuses/Orbits: The visualized orbits are unremarkable. The
visualized paranasal sinuses and mastoid air cells are clear.

Other: None.
IMPRESSION: No acute intracranial process.

## 2022-07-09 ENCOUNTER — Encounter (HOSPITAL_COMMUNITY): Payer: Self-pay

## 2022-07-09 ENCOUNTER — Ambulatory Visit (HOSPITAL_COMMUNITY): Admit: 2022-07-09 | Payer: Medicare Other | Admitting: Gastroenterology

## 2022-07-09 SURGERY — MANOMETRY, ESOPHAGUS
Anesthesia: Choice

## 2022-07-11 HISTORY — PX: OTHER SURGICAL HISTORY: SHX169

## 2022-08-01 ENCOUNTER — Telehealth: Payer: Self-pay | Admitting: Gastroenterology

## 2022-08-01 NOTE — Telephone Encounter (Signed)
Patient's wife is calling states he had to be hospitalized in [place] and had his esophagus stretched and they told him when he returns here he would need another one done in 2-3 weeks. Please advise, wife said to call her

## 2022-08-07 NOTE — Telephone Encounter (Signed)
Pt was out of town and started having issues. He was seen at [facility] and in the hospital for 5 days. They stretched his esophagus and also mentioned there was some fluid on his lung but not enough to worry about. Several days later he started having a lot of pain and they went to hospital in [place]. He had over a liter of fluid drawn off his lung and now he has pneumonia. They were told to follow up with surgeon that did [facility] repair. Wife states Dr. Michaell Cowing stated he did not need to see him. [facility] had mentioned that he needed to have another dilation done. Pt scheduled to see Dr. Tomasa Rand 09/01/22 at 3:40pm. Wife aware of appt.

## 2022-08-14 NOTE — Telephone Encounter (Signed)
Patient's wife returned phone call, stated you could call her at [phone]

## 2022-08-28 ENCOUNTER — Ambulatory Visit: Payer: Medicare Other | Admitting: Student

## 2022-09-01 ENCOUNTER — Encounter: Payer: Self-pay | Admitting: Gastroenterology

## 2022-09-01 ENCOUNTER — Ambulatory Visit (INDEPENDENT_AMBULATORY_CARE_PROVIDER_SITE_OTHER): Payer: Medicare Other | Admitting: Gastroenterology

## 2022-09-01 VITALS — BP 140/82 | HR 50 | Ht 66.0 in | Wt 198.0 lb

## 2022-09-01 DIAGNOSIS — Z9889 Other specified postprocedural states: Secondary | ICD-10-CM | POA: Diagnosis not present

## 2022-09-01 DIAGNOSIS — K5100 Ulcerative (chronic) pancolitis without complications: Secondary | ICD-10-CM | POA: Diagnosis not present

## 2022-09-01 DIAGNOSIS — R1319 Other dysphagia: Secondary | ICD-10-CM

## 2022-09-01 DIAGNOSIS — K51919 Ulcerative colitis, unspecified with unspecified complications: Secondary | ICD-10-CM

## 2022-09-01 MED ORDER — NA SULFATE-K SULFATE-MG SULF 17.5-3.13-1.6 GM/177ML PO SOLN: 1.0000 | Freq: Once | ORAL | 0 refills | Status: AC

## 2022-09-01 NOTE — Progress Notes (Signed)
HPI : Herbert Morrison is a very pleasant 77 year old male with a history of ulcerative colitis, lupus, rheumatoid arthritis and GERD/hiatal hernia who I initially saw in September 2023 with very bothersome upper GI symptoms (chest discomfort/fullness/pressure and shortness of breath).  He underwent an upper endoscopy September 29 showed a 6 cm hiatal hernia.  He was referred to surgery and eventually underwent a hiatal hernia repair with mesh placement and Toupet fundoplication on December 29. At that visit, he was not on maintenance medication for his UC.  He is maintained on low-dose prednisone for his lupus and rheumatoid arthritis, and he reports being in clinical remission, with no symptoms of colitis.  His last colonoscopy was in September 2022 and localized active colitis was noted in the ascending and transverse colon.  He also had a sessile serrated adenoma.  He was recently admitted to [facility] hospital from March 15 through 19 with abdominal pain and nausea and vomiting.  On admission, his CT scan showed a fluid collection near the GEJ which was concerning for a leak versus abscess.  He subsequently underwent an upper GI series which was negative for leak.  He underwent an upper endoscopy on March 19 which showed a moderate amount of food residue in the gastric fundus as well as diffuse inflammatory changes.  Empiric dilation was performed at the GEJ and the pylorus with 20 mm balloon with mild mucosal disruption (unclear if disruption occurred at both sites).  He was discharged that day, but then admitted a few days later to [facility] where he was noted to have a pleural effusion and pneumonia.  He underwent thoracentesis and was treated with IV antibiotics, which were transitioned to oral antibiotics at discharge.  Today, patient reports he is doing much better.  He is not having any dyspnea or cough.  He still has occasional discomfort with taking deep breaths.  He does report dysphagia ever  since his surgery, but this has not progressed.  Interestingly, patient reports more trouble with swallowing liquids and swallowing solids.  He states that he never has any issues with an eating, but that if he tries to drink too much too rapidly, the fluid will sit in his chest and sometimes he will vomit it back up.  He has never had any episodes of having to vomit back up stuck food.  The barium swallow to assess for a leak at [facility] did note esophageal dysmotility characterized by weakened peristalsis, fluid stasis and tertiary contractions.  Patient reports resolution of the symptoms that led to his hernia repair (chest pressure/pain and shortness of breath).  He is not having any abdominal pain.  No nausea or vomiting.  Denies any reflux symptoms such as heartburn or acid regurgitation.  He denies any symptoms suggestive of active colitis such as diarrhea, urgency or blood in the stool.  He is more bothered by constipation, but in general has a formed bowel movement most days.    EGD 07/29/2022  Findings: A surgical stitch was found in the lower third of the esophagus. A medium amount of food (residue) was found in the gastric fundus. Diffuse moderate inflammation characterized by congestion (edema) and  erythema was found in the entire examined stomach. Evidence of a prior partial fundoplication was found in the cardia. This  was characterized by edema and an intact appearance. A TTS dilator was passed through the scope. Dilation of the pylorus and  esophago-gastric junction was performed with an 18-19-20 mm balloon  dilator to 20 mm. The dilation sites were examined following endoscope  reinsertion and showed mild mucosal disruption. The first portion of the duodenum was normal. A 20 mm diverticulum was found in the second portion of the duodenum.  Impression: - Surgical stitch in the lower third of the esophagus. - A medium amount of food (residue) in the stomach. - Gastritis. - A  partial fundoplication was found, characterized by  an intact appearance and edema. - EG junction and pylorus were dilated. - Normal first portion of the duodenum. - Duodenal diverticulum of the second portio    FL esophagus barium swallow Result Date: 07/26/2022 STUDY: Esophagram single contrast CLINICAL HISTORY: Recent hiatal hernia repair with mesh outside hospital in early December. Subacute abdominal pain with PO intolerance COMPARISON: CT chest abdomen and pelvis 07/26/2022 PROCEDURE: Following a scout radiograph of the chest, a Single contrast esophagram was performed utilizing Water-soluble contrast. Images were acquired in multiple projections. Barium was not administered. FINDINGS: Scout radiograph of the chest shows mild cardiomegaly. Left lower lobe atelectasis. Small left pleural effusion. Limited esophagram was performed by the radiology resident on-call. The thoracic esophagus is normal in course and contour. Tapered narrowing of the distal esophagus extending towards the stomach, likely postsurgical changes from prior hernia repair. Narrowest luminal diameter of the esophagus is approximately 4 mm. Mild dilatation of the distal esophagus leading towards this point. No esophageal stricture or mass. No definite leak from the thoracic esophagus. Cervical esophagus was not evaluated. 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 Numerous tertiary contractions observed. Stasis with to and fro flow in the esophagus Gastroesophageal reflux: Not well evaluated on this study. Hiatal hernia: None. Status post hiatal hernia repair with postsurgical deformity involving the proximal stomach.    EGD Sept 2023 (Dr. Tomasa Rand) - The examined portions of the nasopharynx, oropharynx and larynx were normal.  - Ectopic gastric mucosa in the upper third of the esophagus.  - Normal esophagus.  - 6 cm hiatal hernia. This may be a factor in the patient's chest  discomfort.  - Normal examined duodenum.  - No specimens collected.  Colonoscopy: 01/30/2021 (Dr. Karilyn Cota) - Perianal skin tags found on perianal exam. - One 4 to 6 mm polyp in the proximal ascending colon. SSL. - One 6 mm polyp in the mid ascending colon, removed with a cold snare. Resected and retrieved - inflammatory polyp - One 7 mm polyp in the distal ascending colon, removed with a cold snare. Resected and retrieved. - benign mucosa - Congested mucosa in the ascending colon. Biopsied - active chronic colitis - Nodular mucosa at the hepatic flexure. Biopsied - mildly chronic colitis - Localized mild inflammation was found in the proximal transverse colon. Biopsied - chronic colitis - Diverticulosis in the sigmoid colon. - External hemorrhoids.     Colonoscopy:(04/09/16) (Dr. Karilyn Cota) - One small polyp in the cecum, removed with a cold snare. Complete resection. Polyp tissue not retrieved. - Normal mucosa in the entire examined colon. - Scar in the rectum and in the sigmoid colon. - Diverticulosis in the sigmoid colon. - The distal rectum and anal verge are normal on retroflexion view.   Upper Endoscopy: 01/30/2021 (Dr. Karilyn Cota) - Normal hypopharynx. - Normal esophagus. - Widely patent Schatzki ring. Dilated and disrupted. - 7 cm hiatal hernia. - Normal stomach. - Normal duodenal bulb. - Non-bleeding duodenal diverticulum. - No specimens collected.  Colonoscopy Mar 2017 (Dr. Aleene Davidson) - Pan colitis   Upper Endoscopy: (08/03/09) lower esophageal stricture,  dilated, biopsies of gastritis (benign stratified squamous mucosa with minimal focal hyperplasia with mild mononuclear cell infiltrate with rare eosinophil admixed.  Past Medical History:  Diagnosis Date   Anxiety    Arthritis    Cancer    skin cancer removed Left shoulder Basal cell   Depression    Dyspnea    GERD (gastroesophageal reflux disease)    Heart murmur    as a child   Hiatal hernia    History of hiatal  hernia    Hypertension    Lupus 12/31/2020   Rheumatoid arthritis 12/31/2020   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Past Surgical History:  Procedure Laterality Date   BIOPSY  01/30/2021   Procedure: BIOPSY;  Surgeon: Malissa Hippo, MD;  [facility];  Service: Endoscopy;;   CATARACT EXTRACTION Left 2023   COLONOSCOPY N/A 04/09/2016   Procedure: COLONOSCOPY;  Surgeon: Malissa Hippo, MD;  [facility];  Service: Endoscopy;  Laterality: N/A;  1:45   COLONOSCOPY WITH PROPOFOL N/A 01/30/2021   Procedure: COLONOSCOPY WITH PROPOFOL;  Surgeon: Malissa Hippo, MD;  [facility];  Service: Endoscopy;  Laterality: N/A;   ELBOW BURSA SURGERY Left 1983   ESOPHAGOGASTRODUODENOSCOPY (EGD) WITH PROPOFOL N/A 01/30/2021   Procedure: ESOPHAGOGASTRODUODENOSCOPY (EGD) WITH PROPOFOL;  Surgeon: Malissa Hippo, MD;  [facility];  Service: Endoscopy;  Laterality: N/A;  2:00, pt knows to arrive at 11:00   KNEE ARTHROSCOPY Right 1979   left elbow     spur removed   left knee surgery     open surgery for a meniscus tear 1970's   lungs  07/2022   POLYPECTOMY  04/09/2016   Procedure: POLYPECTOMY;  Surgeon: Malissa Hippo, MD;  [facility];  Service: Endoscopy;;  cecal polyp   POLYPECTOMY  01/30/2021   Procedure: POLYPECTOMY;  Surgeon: Malissa Hippo, MD;  [facility];  Service: Endoscopy;;   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   Procedure: LEFT TOTAL HIP ARTHROPLASTY ANTERIOR APPROACH;  Surgeon: Durene Romans, MD;  Location: [facility];  Service: Orthopedics;  Laterality: Left;  70 mins   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   Procedure: TOTAL KNEE ARTHROPLASTY;  Surgeon: Durene Romans, MD;  Location: [facility];  Service: Orthopedics;  Laterality: Left;  70 mins   UMBILICAL HERNIA REPAIR N/A 05/09/2022   Procedure: PRIMARY UMBILICAL HERNIA REPAIR;  Surgeon: Karie Soda, MD;  Location: [facility];  Service: General;  Laterality: N/A;   XI  ROBOTIC ASSISTED PARAESOPHAGEAL HERNIA REPAIR N/A 05/09/2022   Procedure: ROBOTIC REPAIR OF PARAESOPHAGEAL HIATAL HERNIA WITH FUNDOPLICATION;  Surgeon: Karie Soda, MD;  Location: [facility];  Service: General;  Laterality: N/A;  GEN w/ERAS LOCAL   Family History  Problem Relation Age of Onset   Leukemia Father    Bladder Cancer Father    Stomach cancer Neg Hx    Esophageal cancer Neg Hx    Colon cancer Neg Hx    Pancreatic cancer Neg Hx    Social History   Tobacco Use   Smoking status: Former    Packs/day: 1.00    Years: 5.00    Additional pack years: 0.00    Total pack years: 5.00    Types: Cigarettes    Quit date: 1975    Years since quitting: 49.3   Smokeless tobacco: Former    Types: Chew    Quit date: 01/22/1965   Tobacco comments:    quit  43 years ago  Vaping Use   Vaping Use: Never used  Substance Use Topics   Alcohol use: Not Currently    Comment: occasionally   Drug use: No   Current Outpatient Medications  Medication Sig Dispense Refill   amLODipine-valsartan (EXFORGE) 10-320 MG tablet Take 1 tablet by mouth in the morning.     buPROPion (WELLBUTRIN XL) 300 MG 24 hr tablet Take 300 mg by mouth in the morning.     cyclobenzaprine (FLEXERIL) 10 MG tablet Take 10 mg by mouth 3 (three) times daily as needed for muscle spasms.     escitalopram (LEXAPRO) 20 MG tablet Take 20 mg by mouth in the morning.     fluticasone (FLONASE) 50 MCG/ACT nasal spray Place 1 spray into both nostrils daily.     hydrALAZINE (APRESOLINE) 50 MG tablet Take 50 mg by mouth in the morning and at bedtime.     hydroxychloroquine (PLAQUENIL) 200 MG tablet Take 200 mg by mouth 2 (two) times daily.     ketoconazole (NIZORAL) 2 % shampoo Apply 1 Application topically every other day. Every other shower     LORazepam (ATIVAN) 2 MG tablet Take 2 mg by mouth in the morning and at bedtime.     metoprolol succinate (TOPROL-XL) 50 MG 24 hr tablet Take 50 mg by mouth in the morning. Take with or  immediately following a meal.     predniSONE (DELTASONE) 5 MG tablet Take 10 mg by mouth in the morning.     Cholecalciferol (VITAMIN D-3 PO) Take 3 tablets by mouth in the morning. (Patient not taking: Reported on 09/01/2022)     Cyanocobalamin (B-12 SL) Take 1 tablet by mouth in the morning. (Patient not taking: Reported on 09/01/2022)     ibuprofen (ADVIL) 200 MG tablet Take 600 mg by mouth daily as needed for mild pain (Joint inflammation). (Patient not taking: Reported on 09/01/2022)     omeprazole (PRILOSEC) 20 MG capsule Take 20 mg by mouth in the morning. (Patient not taking: Reported on 09/01/2022)     ondansetron (ZOFRAN) 4 MG tablet Take 1 tablet (4 mg total) by mouth every 8 (eight) hours as needed for nausea. (Patient not taking: Reported on 09/01/2022) 8 tablet 10   traMADol (ULTRAM) 50 MG tablet Take 1-2 tablets (50-100 mg total) by mouth every 6 (six) hours as needed for moderate pain or severe pain. (Patient not taking: Reported on 09/01/2022) 20 tablet 0   zinc gluconate 50 MG tablet Take 50 mg by mouth in the morning. (Patient not taking: Reported on 09/01/2022)     No current facility-administered medications for this visit.   Allergies  Allergen Reactions   Remicade [Infliximab] Anaphylaxis   Erythromycin Nausea Only   Grass Pollen(K-O-R-T-Swt Vern) Other (See Comments)    Seasonal allergies    Pollen Extract Other (See Comments)   Sulfasalazine Nausea And Vomiting     Review of Systems: All systems reviewed and negative except where noted in HPI.    No results found.  Physical Exam: BP (!) 140/82   Pulse (!) 50   Ht 5\' 6"  (1.676 m)   Wt 198 lb (89.8 kg)   BMI 31.96 kg/m  Constitutional: Pleasant,well-developed, Caucasian male in no acute distress.  Accompanied by wife HEENT: Normocephalic and atraumatic. Conjunctivae are normal. No scleral icterus. Neck supple.  Cardiovascular: Normal rate, regular rhythm.  Pulmonary/chest: Effort normal and breath sounds  normal. No wheezing, rales or rhonchi. Abdominal: Soft, nondistended, nontender. Bowel sounds active  throughout. There are no masses palpable. No hepatomegaly. Neurological: Alert and oriented to person place and time. Skin: Skin is warm and dry. No rashes noted. Psychiatric: Normal mood and affect. Behavior is normal.  CBC    Component Value Date/Time   WBC 9.4 04/24/2022 0923   RBC 5.11 04/24/2022 0923   HGB 13.9 05/09/2022 1244   HCT 41.0 05/09/2022 1244   PLT 343 04/24/2022 0923   MCV 86.7 04/24/2022 0923   MCH 27.0 04/24/2022 0923   MCHC 31.2 04/24/2022 0923   RDW 15.0 04/24/2022 0923   LYMPHSABS 1.1 07/09/2021 1953   MONOABS 0.6 07/09/2021 1953   EOSABS 0.1 07/09/2021 1953   BASOSABS 0.1 07/09/2021 1953    CMP     Component Value Date/Time   NA 137 05/09/2022 1244   K 4.6 05/09/2022 1244   CL 107 04/24/2022 0923   CO2 23 04/24/2022 0923   GLUCOSE 88 04/24/2022 0923   BUN 17 04/24/2022 0923   CREATININE 1.32 (H) 04/24/2022 0923   CREATININE 1.50 (H) 04/15/2021 1314   CALCIUM 9.1 04/24/2022 0923   PROT 7.9 07/09/2021 1953   ALBUMIN 4.0 07/09/2021 1953   AST 14 (L) 07/09/2021 1953   ALT 19 07/09/2021 1953   ALKPHOS 44 07/09/2021 1953   BILITOT 0.4 07/09/2021 1953   GFRNONAA 57 (L) 04/24/2022 0923   GFRAA >60 08/12/2019 0427     ASSESSMENT AND PLAN:  77 year old male with longstanding pan ulcerative colitis, with most recent colonoscopy in 2022 with mild focal colitis in the ascending and transverse colon, and a right-sided sessile serrated polyp, currently on no maintenance therapy other than low-dose prednisone, which is prescribed for his rheumatoid arthritis and lupus.  He has previously been intolerant of mesalamine.  He currently denies any symptoms suggestive of active disease.  We did not address his ulcerative colitis at our initial visit because he was so symptomatic from his hiatal hernia.  Patient is due for dysplasia surveillance.  If he does not have  any significant active disease, think we need to add any additional therapy given his age and lack of symptoms.  If he does have active colitis, then I think we should discuss potential plans for maintenance therapy.  With regards to his esophageal dysphagia, I suspect maybe his symptoms are more related to esophageal dysmotility, although his symptoms are quite atypical (primarily liquid dysphagia, with little to no solid dysphagia).  He underwent a dilation with a 20 mm balloon with mild mucosal disruption.  I do not know that he needs empiric redilation given his ability to eat food normally.  The etiology for his hospitalization and presentation there is unclear to me.Marland Kitchen  He underwent esophageal manometry at [facility] prior to his hernia repair/fundoplication.  I am unable to view the results, but presumably they did not reveal any major abnormalities, given that his surgeon proceeded with his fundoplication.  I do not think repeat esophageal manometry is necessary or helpful. For now, I recommended continued observation, but if he feels like his swallowing is getting worse, we can repeat empiric dilation.  He does not have symptoms that are suggestive of gastroparesis.  The remnant food in his stomach noted during his upper endoscopy is not easily explained, but in the absence of chronic symptoms, I do not recommend proceeding with a gastric emptying study or repeating pyloric dilation empirically.   Ulcerative colitis - Schedule for surveillance colonoscopy with biopsies for dysplasia - Discussed maintenance therapy options if active colitis found  on colonoscopy  Dysphagia, status post fundoplication - No further evaluation or intervention recommended at this time - Consider repeat empiric dilation if symptoms worsen  The details, risks (including bleeding, perforation, infection, missed lesions, medication reactions and possible hospitalization or surgery if complications occur), benefits, and  alternatives to colonoscopy with possible biopsy and possible polypectomy were discussed with the patient and he consents to proceed.    I spent a total of 40 minutes reviewing the patient's medical record and recent hospitalization, interviewing and examining the patient, discussing his diagnosis and management of her condition going forward, and documenting in the medical record   Chetan Mehring E. Tomasa Rand, MD [facility]  Arlina Robes, *

## 2022-09-01 NOTE — Patient Instructions (Addendum)
_______________________________________________________  If your blood pressure at your visit was 140/90 or greater, please contact your primary care physician to follow up on this.  _______________________________________________________  If you are age 75 or older, your body mass index should be between 23-30. Your Body mass index is 31.96 kg/m. If this is out of the aforementioned range listed, please consider follow up with your Primary Care Provider.  If you are age 26 or younger, your body mass index should be between 19-25. Your Body mass index is 31.96 kg/m. If this is out of the aformentioned range listed, please consider follow up with your Primary Care Provider.   You have been scheduled for a colonoscopy. Please follow written instructions given to you at your visit today.  Please pick up your prep supplies at the pharmacy within the next 1-3 days. If you use inhalers (even only as needed), please bring them with you on the day of your procedure.   ________________________________________________________  [facility] providers would like to encourage you to use [facility] to communicate with providers for non-urgent requests or questions.  Due to long hold times on the telephone, sending your provider a message by [facility] may be a faster and more efficient way to get a response.  Please allow 48 business hours for a response.  Please remember that this is for non-urgent requests.   It was a pleasure to see you today!  Thank you for trusting me with your gastrointestinal care!    Scott E.Tomasa Rand

## 2022-10-01 ENCOUNTER — Telehealth: Payer: Self-pay | Admitting: Student

## 2022-10-01 NOTE — Telephone Encounter (Signed)
Can we see if he's willing to follow up in our clinic in the next 1-4 weeks?  Thanks!

## 2022-10-07 NOTE — Progress Notes (Unsigned)
Synopsis: Referred for dyspnea by Arlina Robes, *  Subjective:   PATIENT ID: Herbert Morrison GENDER: male DOB: 1946/02/01, MRN: [id number]  No chief complaint on file.  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 Wynona Neat for chest heaviness/discomfort, DOE of unclear etiology. He was encouraged to increase dose of prednisone to 20 mg daily for 7d but failed to note any improvement with this change. Records requested. PFT yesterday shows a little bit of air trapping by reduced FVC/SVC but otherwise technically normal.   He thinks albuterol is maybe helpful.  DOE to 25-50 feet. Cough is dry but bothersome. He recently tried decreasing his omeprazole to 20 mg daily but self-increased to 20 mg twice daily due to more trouble with reflux.   He does snore but has had PSG that was normal 2-3 years ago. He thinks he has gained 25-30 lb since then. He does feel sleepy during the day.    He smoked for about 5-6 years 1 pack every 4 days typically in distant past.   Interval HPI:  CPEX with hypertensive response to exercise, hyperventilation at end exercise, normal max VO2 (105% predicted).   PSG with AHI 2.9, RDI though of 48/hr  DVT US neg  No change symptomatically since last visit.   Trying to get in to see surgery to discuss surgical management of GERD/hiatal hernia.   ----------------------------------- Had robotic reduction of hiatal hernia, partial toupet fundoplication, mesh reinforcement with absorbable mesh done by Dr. Michaell Cowing in late December 2023.   He was seen by Dr. Tomasa Rand 09/01/22. He had just had admission 3/25-3/19 at [facility] for abd pain, n/v found to have fluid collection near GEJ. Empiric dilation performed at [facility], discharged that day but then readmitted to [facility] where he was found to have L loculated pleural effusion and pneumonia. Underwent pigtail drainage and was treated with ABX.   He was doing  well at visit with Dr. Tomasa Rand reporting resolution of chest pressure/pain and dyspnea that he assd with hernia.   Otherwise pertinent review of systems is negative.    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   Depression    Dyspnea    GERD (gastroesophageal reflux disease)    Heart murmur    as a child   Hiatal hernia    History of hiatal hernia    Hypertension    Lupus (HCC) 12/31/2020   Rheumatoid arthritis (HCC) 12/31/2020   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(HCC)      Family History  Problem Relation Age of Onset   Leukemia Father    Bladder Cancer Father    Stomach cancer Neg Hx    Esophageal cancer Neg Hx    Colon cancer Neg Hx    Pancreatic cancer Neg Hx      Past Surgical History:  Procedure Laterality Date   BIOPSY  01/30/2021   Procedure: BIOPSY;  Surgeon: Malissa Hippo, MD;  [facility];  Service: Endoscopy;;   CATARACT EXTRACTION Left 2023   COLONOSCOPY N/A 04/09/2016   Procedure: COLONOSCOPY;  Surgeon: Malissa Hippo, MD;  [facility];  Service: Endoscopy;  Laterality: N/A;  1:45   COLONOSCOPY WITH PROPOFOL N/A 01/30/2021   Procedure: COLONOSCOPY WITH PROPOFOL;  Surgeon: Malissa Hippo, MD;  [facility];  Service:  Endoscopy;  Laterality: N/A;   ELBOW BURSA SURGERY Left 1983   ESOPHAGOGASTRODUODENOSCOPY (EGD) WITH PROPOFOL N/A 01/30/2021   Procedure: ESOPHAGOGASTRODUODENOSCOPY (EGD) WITH PROPOFOL;  Surgeon: Malissa Hippo, MD;  [facility];  Service: Endoscopy;  Laterality: N/A;  2:00, pt knows to arrive at 11:00   KNEE ARTHROSCOPY Right 1979   left elbow     spur removed   left knee surgery     open surgery for a meniscus tear 1970's   lungs  07/2022   POLYPECTOMY  04/09/2016   Procedure: POLYPECTOMY;  Surgeon: Malissa Hippo, MD;  [facility];  Service: Endoscopy;;  cecal polyp   POLYPECTOMY  01/30/2021    Procedure: POLYPECTOMY;  Surgeon: Malissa Hippo, MD;  [facility];  Service: Endoscopy;;   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   Procedure: LEFT TOTAL HIP ARTHROPLASTY ANTERIOR APPROACH;  Surgeon: Durene Romans, MD;  [facility];  Service: Orthopedics;  Laterality: Left;  70 mins   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   Procedure: TOTAL KNEE ARTHROPLASTY;  Surgeon: Durene Romans, MD;  [facility];  Service: Orthopedics;  Laterality: Left;  70 mins   UMBILICAL HERNIA REPAIR N/A 05/09/2022   Procedure: PRIMARY UMBILICAL HERNIA REPAIR;  Surgeon: Karie Soda, MD;  [facility];  Service: General;  Laterality: N/A;   XI ROBOTIC ASSISTED PARAESOPHAGEAL HERNIA REPAIR N/A 05/09/2022   Procedure: ROBOTIC REPAIR OF PARAESOPHAGEAL HIATAL HERNIA WITH FUNDOPLICATION;  Surgeon: Karie Soda, MD;  [facility];  Service: General;  Laterality: N/A;  GEN w/ERAS LOCAL    Social History   Socioeconomic History   Marital status: Married    Spouse name: Not on file   Number of children: Not on file   Years of education: Not on file   Highest education level: Not on file  Occupational History   Not on file  Tobacco Use   Smoking status: Former    Packs/day: 1.00    Years: 5.00    Additional pack years: 0.00    Total pack years: 5.00    Types: Cigarettes    Quit date: 57    Years since quitting: 49.4   Smokeless tobacco: Former    Types: Chew    Quit date: 01/22/1965   Tobacco comments:    quit 43 years ago  Vaping Use   Vaping Use: Never used  Substance and Sexual Activity   Alcohol use: Not Currently    Comment: occasionally   Drug use: No   Sexual activity: Yes  Other Topics Concern   Not on file  Social History Narrative   Not on file   Social Determinants of Health   Financial Resource Strain: Not on file  Food Insecurity: No Food Insecurity (05/10/2022)   Hunger Vital Sign    Worried About Running Out of Food in the Last Year: Never  true    Ran Out of Food in the Last Year: Never true  Transportation Needs: No Transportation Needs (05/10/2022)   PRAPARE - Administrator, Civil Service (Medical): No    Lack of Transportation (Non-Medical): No  Physical Activity: Not on file  Stress: Not on file  Social Connections: Not on file  Intimate Partner Violence: Not At Risk (05/10/2022)   Humiliation, Afraid, Rape, and Kick questionnaire    Fear of Current or Ex-Partner: No    Emotionally Abused: No    Physically Abused: No    Sexually Abused: No  Allergies  Allergen Reactions   Remicade [Infliximab] Anaphylaxis   Erythromycin Nausea Only   Grass Pollen(K-O-R-T-Swt Vern) Other (See Comments)    Seasonal allergies    Pollen Extract Other (See Comments)   Sulfasalazine Nausea And Vomiting     Outpatient Medications Prior to Visit  Medication Sig Dispense Refill   amLODipine-valsartan (EXFORGE) 10-320 MG tablet Take 1 tablet by mouth in the morning.     buPROPion (WELLBUTRIN XL) 300 MG 24 hr tablet Take 300 mg by mouth in the morning.     Cholecalciferol (VITAMIN D-3 PO) Take 3 tablets by mouth in the morning. (Patient not taking: Reported on 09/01/2022)     Cyanocobalamin (B-12 SL) Take 1 tablet by mouth in the morning. (Patient not taking: Reported on 09/01/2022)     cyclobenzaprine (FLEXERIL) 10 MG tablet Take 10 mg by mouth 3 (three) times daily as needed for muscle spasms.     escitalopram (LEXAPRO) 20 MG tablet Take 20 mg by mouth in the morning.     fluticasone (FLONASE) 50 MCG/ACT nasal spray Place 1 spray into both nostrils daily.     hydrALAZINE (APRESOLINE) 50 MG tablet Take 50 mg by mouth in the morning and at bedtime.     hydroxychloroquine (PLAQUENIL) 200 MG tablet Take 200 mg by mouth 2 (two) times daily.     ibuprofen (ADVIL) 200 MG tablet Take 600 mg by mouth daily as needed for mild pain (Joint inflammation). (Patient not taking: Reported on 09/01/2022)     ketoconazole (NIZORAL) 2 %  shampoo Apply 1 Application topically every other day. Every other shower     LORazepam (ATIVAN) 2 MG tablet Take 2 mg by mouth in the morning and at bedtime.     metoprolol succinate (TOPROL-XL) 50 MG 24 hr tablet Take 50 mg by mouth in the morning. Take with or immediately following a meal.     omeprazole (PRILOSEC) 20 MG capsule Take 20 mg by mouth in the morning. (Patient not taking: Reported on 09/01/2022)     ondansetron (ZOFRAN) 4 MG tablet Take 1 tablet (4 mg total) by mouth every 8 (eight) hours as needed for nausea. (Patient not taking: Reported on 09/01/2022) 8 tablet 10   predniSONE (DELTASONE) 5 MG tablet Take 10 mg by mouth in the morning.     traMADol (ULTRAM) 50 MG tablet Take 1-2 tablets (50-100 mg total) by mouth every 6 (six) hours as needed for moderate pain or severe pain. (Patient not taking: Reported on 09/01/2022) 20 tablet 0   zinc gluconate 50 MG tablet Take 50 mg by mouth in the morning. (Patient not taking: Reported on 09/01/2022)     No facility-administered medications prior to visit.       Objective:   Physical Exam:  General appearance: 77 y.o., male, NAD, conversant  Eyes: anicteric sclerae; PERRL, tracking appropriately HENT: NCAT; MMM Neck: Trachea midline; no lymphadenopathy, no JVD Lungs: CTAB, no crackles, no wheeze, with normal respiratory effort CV: RRR, no murmur  Abdomen: Soft, non-tender; non-distended, BS present  Extremities: 2+ BLE pitting edema, warm Skin: Normal turgor and texture; no rash Psych: Appropriate affect Neuro: Alert and oriented to person and place, no focal deficit     There were no vitals filed for this visit.     on RA BMI Readings from Last 3 Encounters:  09/01/22 31.96 kg/m  05/09/22 36.94 kg/m  04/24/22 36.94 kg/m   Wt Readings from Last 3 Encounters:  09/01/22 198 lb (89.8 kg)  05/09/22 222 lb (100.7 kg)  04/24/22 222 lb (100.7 kg)     CBC    Component Value Date/Time   WBC 9.4 04/24/2022 0923   RBC  5.11 04/24/2022 0923   HGB 13.9 05/09/2022 1244   HCT 41.0 05/09/2022 1244   PLT 343 04/24/2022 0923   MCV 86.7 04/24/2022 0923   MCH 27.0 04/24/2022 0923   MCHC 31.2 04/24/2022 0923   RDW 15.0 04/24/2022 0923   LYMPHSABS 1.1 07/09/2021 1953   MONOABS 0.6 07/09/2021 1953   EOSABS 0.1 07/09/2021 1953   BASOSABS 0.1 07/09/2021 1953    Chest Imaging:  CT Chest 10/04/21 OSH report reviewed by me: normal parenchyma, no PE  EGD 01/30/21: - Normal hypopharynx. - Normal esophagus. - Widely patent Schatzki ring. Dilated and disrupted. - 7 cm hiatal hernia. - Normal stomach. - Normal duodenal bulb. - Non-bleeding duodenal diverticulum. - No specimens collected.  DVT US BLE 12/09/21 WNL  Pulmonary Functions Testing Results:    Latest Ref Rng & Units 12/02/2021    4:26 PM  PFT Results  FVC-Pre L 2.85   FVC-Predicted Pre % 82   FVC-Post L 2.88   FVC-Predicted Post % 83   Pre FEV1/FVC % % 82   Post FEV1/FCV % % 84   FEV1-Pre L 2.34   FEV1-Predicted Pre % 93   FEV1-Post L 2.43   DLCO uncorrected ml/min/mmHg 24.33   DLCO UNC% % 112   DLCO corrected ml/min/mmHg 24.33   DLCO COR %Predicted % 112   DLVA Predicted % 114   TLC L 6.91   TLC % Predicted % 114   RV % Predicted % 169    PFT 12/02/21 reviewed by me with air trapping by reduced FVC/SVC but otherwise technically normal  CPEX 12/11/21: Exercise testing with gas exchange demonstrates normal functional capacity when compared to matched sedentary norms. There is no clear cardiopulmonary abnormality. Patient appears primarily limited due to body habitus. VE/VCO2 slope is moderately elevated and could indicate increased pulmonary pressures, however with low PETCO2, this could more like be associated with hyperventilation at peak exercise. There was a hypertensive response to note at peak exercise.  Echocardiogram:   TTE 08/31/21 with G1DD  [facility] stress test 10/21/21 normal    Assessment & Plan:   # DOE  Deconditioning favored.  His chronic prednisone could be masking obstruction on PFT though and he does have a hint of air trapping (which could however also be due to obesity) though little symptomatic response to LABA/LAMA.    # Chronic cough Feel GERD/LPR/irritable larynx most likely culprit. Untreated OSA could be contributing to more severe GERD symptoms.  # Snoring # Upper airway resistance syndrome - Mild-moderate OSA  Plan: - trial autopap 5-15 with nasal pillows - ppi per GI - see you in 3 months to discuss PAP   Follow up in 3 months   Omar Person, MD [facility] 10/07/2022 11:08 AM

## 2022-10-08 ENCOUNTER — Ambulatory Visit (INDEPENDENT_AMBULATORY_CARE_PROVIDER_SITE_OTHER): Payer: Medicare Other | Admitting: Student

## 2022-10-08 ENCOUNTER — Encounter: Payer: Self-pay | Admitting: Student

## 2022-10-08 VITALS — BP 128/66 | HR 60 | Temp 98.4°F | Ht 66.0 in | Wt 201.0 lb

## 2022-10-08 DIAGNOSIS — G4719 Other hypersomnia: Secondary | ICD-10-CM | POA: Diagnosis not present

## 2022-10-08 DIAGNOSIS — R0609 Other forms of dyspnea: Secondary | ICD-10-CM

## 2022-10-08 NOTE — Patient Instructions (Addendum)
-   would check ABG to see if you're still dealing with high CO2 level during the day, would check upright and supine spirometry and MIP/MEP to see if you've got diaphragm weakness that could be related to your surgery. If CT Chest shows lung inflammation I would want to check PJP sputum (pneumocystis smear) to look for an infection that steroids can precipitate. I would want you to follow up with one of our sleep doctors here at next visit to review all of this with you because you may need another sleep study or trial of a more advanced device than CPAP (something like BiPAP) to treat hypoventilation. Drs. Jeannetta Ellis, or Normal could help with this

## 2022-10-15 ENCOUNTER — Telehealth: Payer: Self-pay | Admitting: Gastroenterology

## 2022-10-15 NOTE — Telephone Encounter (Signed)
Patient cancel colonoscopy procedure for tomorrow 6/6 due to misplacing prep instructions.

## 2022-10-16 ENCOUNTER — Encounter: Payer: Medicare Other | Admitting: Gastroenterology

## 2022-10-23 ENCOUNTER — Ambulatory Visit (AMBULATORY_SURGERY_CENTER): Payer: Medicare Other

## 2022-10-23 VITALS — Ht 66.0 in | Wt 215.0 lb

## 2022-10-23 DIAGNOSIS — K51919 Ulcerative colitis, unspecified with unspecified complications: Secondary | ICD-10-CM

## 2022-10-23 MED ORDER — PEG 3350-KCL-NA BICARB-NACL 420 G PO SOLR
4000.0000 mL | Freq: Once | ORAL | 0 refills | Status: AC
Start: 2022-10-23 — End: 2022-10-23

## 2022-10-23 NOTE — Progress Notes (Signed)
No egg or soy allergy known to patient  No issues known to pt with past sedation with any surgeries or procedures Patient denies ever being told they had issues or difficulty with intubation  No FH of Malignant Hyperthermia Pt is not on diet pills Pt is not on  home 02  Pt is not on blood thinners  Pt denies issues with constipation  No A fib or A flutter Have any cardiac testing pending--no Pt instructed to use Single[employer]com or GoodRx for a price reduction on prep  Patient's chart reviewed by Herbert Morrison CNRA prior to previsit and patient appropriate for the [facility].  Previsit completed and red dot placed by patient's name on their procedure day (on provider's schedule).   Can ambulate without assistance 

## 2022-10-30 ENCOUNTER — Telehealth (INDEPENDENT_AMBULATORY_CARE_PROVIDER_SITE_OTHER): Payer: Self-pay | Admitting: *Deleted

## 2022-11-12 ENCOUNTER — Encounter: Payer: Self-pay | Admitting: Gastroenterology

## 2022-11-12 ENCOUNTER — Ambulatory Visit (AMBULATORY_SURGERY_CENTER): Payer: Medicare Other | Admitting: Gastroenterology

## 2022-11-12 VITALS — BP 122/65 | HR 56 | Temp 97.7°F | Resp 13 | Ht 66.0 in | Wt 215.0 lb

## 2022-11-12 DIAGNOSIS — K51919 Ulcerative colitis, unspecified with unspecified complications: Secondary | ICD-10-CM

## 2022-11-12 DIAGNOSIS — K635 Polyp of colon: Secondary | ICD-10-CM | POA: Diagnosis not present

## 2022-11-12 DIAGNOSIS — D123 Benign neoplasm of transverse colon: Secondary | ICD-10-CM

## 2022-11-12 DIAGNOSIS — D120 Benign neoplasm of cecum: Secondary | ICD-10-CM | POA: Diagnosis not present

## 2022-11-12 DIAGNOSIS — D122 Benign neoplasm of ascending colon: Secondary | ICD-10-CM | POA: Diagnosis not present

## 2022-11-12 DIAGNOSIS — K621 Rectal polyp: Secondary | ICD-10-CM | POA: Diagnosis not present

## 2022-11-12 DIAGNOSIS — D128 Benign neoplasm of rectum: Secondary | ICD-10-CM

## 2022-11-12 DIAGNOSIS — K6389 Other specified diseases of intestine: Secondary | ICD-10-CM | POA: Diagnosis not present

## 2022-11-12 DIAGNOSIS — K5100 Ulcerative (chronic) pancolitis without complications: Secondary | ICD-10-CM

## 2022-11-12 MED ORDER — SODIUM CHLORIDE 0.9 % IV SOLN
500.0000 mL | INTRAVENOUS | Status: DC
Start: 2022-11-12 — End: 2022-11-12

## 2022-11-12 NOTE — Progress Notes (Signed)
[facility] History and Physical   Primary Care Physician:  Arlina Robes, MD   Reason for Procedure:   Dysplasia surveillance, long standing pan ulcerative colitis  Plan:    Colonoscopy with dysplasia surveillance biopsies     HPI: Herbert Morrison is a 77 y.o. male undergoing dysplasia surveillance colonoscopy.  He has long standing pan ulcerative colitis but has not been on maintenance therapy for many years.  He is on chronic steroids for his rheumatoid arthritis.  He denies abdominal pain, diarrhea, urgency, tenesmus or bloody stools.     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   Depression    Dyspnea    GERD (gastroesophageal reflux disease)    Heart murmur    as a child   Hiatal hernia    History of hiatal hernia    Hypertension    Lupus (HCC) 12/31/2020   Rheumatoid arthritis (HCC) 12/31/2020   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[facility])     Past Surgical History:  Procedure Laterality Date   BIOPSY  01/30/2021   Procedure: BIOPSY;  Surgeon: Malissa Hippo, MD;  [facility];  Service: Endoscopy;;   CATARACT EXTRACTION Left 2023   COLONOSCOPY N/A 04/09/2016   Procedure: COLONOSCOPY;  Surgeon: Malissa Hippo, MD;  [facility];  Service: Endoscopy;  Laterality: N/A;  1:45   COLONOSCOPY WITH PROPOFOL N/A 01/30/2021   Procedure: COLONOSCOPY WITH PROPOFOL;  Surgeon: Malissa Hippo, MD;  [facility];  Service: Endoscopy;  Laterality: N/A;   ELBOW BURSA SURGERY Left 1983   ESOPHAGOGASTRODUODENOSCOPY (EGD) WITH PROPOFOL N/A 01/30/2021   Procedure: ESOPHAGOGASTRODUODENOSCOPY (EGD) WITH PROPOFOL;  Surgeon: Malissa Hippo, MD;  [facility];  Service: Endoscopy;  Laterality: N/A;  2:00, pt knows to arrive at 11:00   KNEE ARTHROSCOPY Right 1979   left elbow     spur removed   left knee surgery     open surgery for a meniscus  tear 1970's   lungs  07/2022   POLYPECTOMY  04/09/2016   Procedure: POLYPECTOMY;  Surgeon: Malissa Hippo, MD;  [facility];  Service: Endoscopy;;  cecal polyp   POLYPECTOMY  01/30/2021   Procedure: POLYPECTOMY;  Surgeon: Malissa Hippo, MD;  [facility];  Service: Endoscopy;;   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   Procedure: LEFT TOTAL HIP ARTHROPLASTY ANTERIOR APPROACH;  Surgeon: Durene Romans, MD;  [facility];  Service: Orthopedics;  Laterality: Left;  70 mins   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   Procedure: TOTAL KNEE ARTHROPLASTY;  Surgeon: Durene Romans, MD;  [facility];  Service: Orthopedics;  Laterality: Left;  70 mins   UMBILICAL HERNIA REPAIR N/A 05/09/2022   Procedure: PRIMARY UMBILICAL HERNIA REPAIR;  Surgeon: Karie Soda, MD;  [facility];  Service: General;  Laterality: N/A;   XI ROBOTIC ASSISTED PARAESOPHAGEAL HERNIA REPAIR N/A 05/09/2022   Procedure: ROBOTIC REPAIR OF PARAESOPHAGEAL HIATAL HERNIA WITH FUNDOPLICATION;  Surgeon: Karie Soda, MD;  [facility];  Service: General;  Laterality: N/A;  GEN w/ERAS LOCAL    Prior to Admission medications   Medication Sig Start Date End Date Taking? Authorizing Provider  amLODipine-valsartan (EXFORGE) 10-320 MG tablet Take 1 tablet by mouth in the morning.   Yes [provider]  Ernestina Patches 62.5-25 MCG/ACT AEPB Inhale 1 puff into the  lungs daily. 10/13/22  Yes [provider]  buPROPion (WELLBUTRIN XL) 300 MG 24 hr tablet Take 300 mg by mouth in the morning.   Yes [provider]  escitalopram (LEXAPRO) 20 MG tablet Take 20 mg by mouth in the morning.   Yes [provider]  fluticasone (FLONASE) 50 MCG/ACT nasal spray Place 1 spray into both nostrils daily.   Yes [provider]  hydrALAZINE (APRESOLINE) 50 MG tablet Take 50 mg by mouth in the morning and at bedtime.   Yes [provider]  hydroxychloroquine (PLAQUENIL)  200 MG tablet Take 200 mg by mouth 2 (two) times daily.   Yes [provider]  LORazepam (ATIVAN) 2 MG tablet Take 2 mg by mouth every 6 (six) hours as needed for anxiety.   Yes [provider]  metoprolol succinate (TOPROL-XL) 50 MG 24 hr tablet Take 50 mg by mouth in the morning. Take with or immediately following a meal.   Yes [provider]  cyclobenzaprine (FLEXERIL) 10 MG tablet Take 10 mg by mouth 3 (three) times daily as needed for muscle spasms. 08/24/20   [provider]  famotidine (PEPCID) 20 MG tablet     [provider]  ibuprofen (ADVIL) 200 MG tablet Take 600 mg by mouth daily as needed for mild pain (Joint inflammation).    [provider]  ketoconazole (NIZORAL) 2 % shampoo Apply 1 Application topically every other day. Every other shower    [provider]  metoCLOPramide (REGLAN) 5 MG tablet     [provider]  omeprazole (PRILOSEC) 20 MG capsule Take 20 mg by mouth in the morning. Patient not taking: Reported on 10/23/2022    [provider]  polyethylene glycol (MIRALAX / GLYCOLAX) 17 g packet Take by mouth. Patient not taking: Reported on 10/23/2022    [provider]  predniSONE (DELTASONE) 5 MG tablet Take 10 mg by mouth in the morning. 01/01/21   [provider]  traMADol (ULTRAM) 50 MG tablet Take 1-2 tablets (50-100 mg total) by mouth every 6 (six) hours as needed for moderate pain or severe pain. 05/09/22   Karie Soda, MD  zinc gluconate 50 MG tablet Take 50 mg by mouth in the morning. Patient not taking: Reported on 10/23/2022    [provider]    Current Outpatient Medications  Medication Sig Dispense Refill   amLODipine-valsartan (EXFORGE) 10-320 MG tablet Take 1 tablet by mouth in the morning.     ANORO ELLIPTA 62.5-25 MCG/ACT AEPB Inhale 1 puff into the lungs daily.     buPROPion (WELLBUTRIN XL) 300 MG 24 hr tablet Take 300 mg by mouth in the morning.      escitalopram (LEXAPRO) 20 MG tablet Take 20 mg by mouth in the morning.     fluticasone (FLONASE) 50 MCG/ACT nasal spray Place 1 spray into both nostrils daily.     hydrALAZINE (APRESOLINE) 50 MG tablet Take 50 mg by mouth in the morning and at bedtime.     hydroxychloroquine (PLAQUENIL) 200 MG tablet Take 200 mg by mouth 2 (two) times daily.     LORazepam (ATIVAN) 2 MG tablet Take 2 mg by mouth every 6 (six) hours as needed for anxiety.     metoprolol succinate (TOPROL-XL) 50 MG 24 hr tablet Take 50 mg by mouth in the morning. Take with or immediately following a meal.     cyclobenzaprine (FLEXERIL) 10 MG tablet Take 10 mg by mouth 3 (three) times daily  as needed for muscle spasms.     famotidine (PEPCID) 20 MG tablet      ibuprofen (ADVIL) 200 MG tablet Take 600 mg by mouth daily as needed for mild pain (Joint inflammation).     ketoconazole (NIZORAL) 2 % shampoo Apply 1 Application topically every other day. Every other shower     metoCLOPramide (REGLAN) 5 MG tablet      omeprazole (PRILOSEC) 20 MG capsule Take 20 mg by mouth in the morning. (Patient not taking: Reported on 10/23/2022)     polyethylene glycol (MIRALAX / GLYCOLAX) 17 g packet Take by mouth. (Patient not taking: Reported on 10/23/2022)     predniSONE (DELTASONE) 5 MG tablet Take 10 mg by mouth in the morning.     traMADol (ULTRAM) 50 MG tablet Take 1-2 tablets (50-100 mg total) by mouth every 6 (six) hours as needed for moderate pain or severe pain. 20 tablet 0   zinc gluconate 50 MG tablet Take 50 mg by mouth in the morning. (Patient not taking: Reported on 10/23/2022)     Current Facility-Administered Medications  Medication Dose Route Frequency Provider Last Rate Last Admin   0.9 %  sodium chloride infusion  500 mL Intravenous Continuous Jenel Lucks, MD        Allergies as of 11/12/2022 - Review Complete 11/12/2022  Allergen Reaction Noted   Remicade [infliximab] Anaphylaxis 09/30/2021   Erythromycin Nausea Only  07/17/2014   Grass pollen(k-o-r-t-swt vern) Other (See Comments) 11/05/2021   Pollen extract Other (See Comments) 11/05/2021   Sulfasalazine Nausea And Vomiting 01/23/2021    Family History  Problem Relation Age of Onset   Leukemia Father    Bladder Cancer Father    Stomach cancer Neg Hx    Esophageal cancer Neg Hx    Colon cancer Neg Hx    Pancreatic cancer Neg Hx    Colon polyps Neg Hx    Rectal cancer Neg Hx     Social History   Socioeconomic History   Marital status: Married    Spouse name: Not on file   Number of children: Not on file   Years of education: Not on file   Highest education level: Not on file  Occupational History   Not on file  Tobacco Use   Smoking status: Former    Packs/day: 1.00    Years: 5.00    Additional pack years: 0.00    Total pack years: 5.00    Types: Cigarettes    Quit date: 75    Years since quitting: 49.5   Smokeless tobacco: Former    Types: Chew    Quit date: 01/22/1965   Tobacco comments:    quit 43 years ago  Vaping Use   Vaping Use: Never used  Substance and Sexual Activity   Alcohol use: Not Currently    Comment: occasionally   Drug use: No   Sexual activity: Yes  Other Topics Concern   Not on file  Social History Narrative   Not on file   Social Determinants of Health   Financial Resource Strain: Not on file  Food Insecurity: No Food Insecurity (05/10/2022)   Hunger Vital Sign    Worried About Running Out of Food in the Last Year: Never true    Ran Out of Food in the Last Year: Never true  Transportation Needs: No Transportation Needs (05/10/2022)   PRAPARE - Administrator, Civil Service (Medical): No    Lack of Transportation (Non-Medical): No  Physical Activity: Not on file  Stress: Not on file  Social Connections: Not on file  Intimate Partner Violence: Not At Risk (05/10/2022)   Humiliation, Afraid, Rape, and Kick questionnaire    Fear of Current or Ex-Partner: No    Emotionally Abused:  No    Physically Abused: No    Sexually Abused: No    Review of Systems:  All other review of systems negative except as mentioned in the HPI.  Physical Exam: Vital signs BP (!) 157/84   Pulse (!) 55   Temp 97.7 F (36.5 C)   Resp 14   Ht 5\' 6"  (1.676 m)   Wt 215 lb (97.5 kg)   SpO2 98%   BMI 34.70 kg/m   General:   Alert,  Well-developed, well-nourished, pleasant and cooperative in NAD Airway:  Mallampati 3 Lungs:  Clear throughout to auscultation.   Heart:  Regular rate and rhythm; no murmurs, clicks, rubs,  or gallops. Abdomen:  Soft, nontender and nondistended. Normal bowel sounds.   Neuro/Psych:  Normal mood and affect. A and O x 3   Racine Erby E. Tomasa Rand, MD [facility]

## 2022-11-12 NOTE — Patient Instructions (Signed)
-  Handout on polyps provided -await pathology results -repeat colonoscopy for surveillance recommended. Date to be determined when pathology result become available   -Continue present medications  No aspirin, ibuprofen, naproxen, or other non-steroidal anti-inflammatory drugs for 2 weeks after polyp removal.   YOU HAD AN ENDOSCOPIC PROCEDURE TODAY AT THE [facility]:   Refer to the procedure report that was given to you for any specific questions about what was found during the examination.  If the procedure report does not answer your questions, please call your gastroenterologist to clarify.  If you requested that your care partner not be given the details of your procedure findings, then the procedure report has been included in a sealed envelope for you to review at your convenience later.  YOU SHOULD EXPECT: Some feelings of bloating in the abdomen. Passage of more gas than usual.  Walking can help get rid of the air that was put into your GI tract during the procedure and reduce the bloating. If you had a lower endoscopy (such as a colonoscopy or flexible sigmoidoscopy) you may notice spotting of blood in your stool or on the toilet paper. If you underwent a bowel prep for your procedure, you may not have a normal bowel movement for a few days.  Please Note:  You might notice some irritation and congestion in your nose or some drainage.  This is from the oxygen used during your procedure.  There is no need for concern and it should clear up in a day or so.  SYMPTOMS TO REPORT IMMEDIATELY:  Following lower endoscopy (colonoscopy or flexible sigmoidoscopy):  Excessive amounts of blood in the stool  Significant tenderness or worsening of abdominal pains  Swelling of the abdomen that is new, acute  Fever of 100F or higher   For urgent or emergent issues, a gastroenterologist can be reached at any hour by calling [phone]. Do not use MyChart messaging for urgent concerns.     DIET:  We do recommend a small meal at first, but then you may proceed to your regular diet.  Drink plenty of fluids but you should avoid alcoholic beverages for 24 hours.  ACTIVITY:  You should plan to take it easy for the rest of today and you should NOT DRIVE or use heavy machinery until tomorrow (because of the sedation medicines used during the test).    FOLLOW UP: Our staff will call the number listed on your records the next business day following your procedure.  We will call around 7:15- 8:00 am to check on you and address any questions or concerns that you may have regarding the information given to you following your procedure. If we do not reach you, we will leave a message.     If any biopsies were taken you will be contacted by phone or by letter within the next 1-3 weeks.  Please call us at [phone] if you have not heard about the biopsies in 3 weeks.    SIGNATURES/CONFIDENTIALITY: You and/or your care partner have signed paperwork which will be entered into your electronic medical record.  These signatures attest to the fact that that the information above on your After Visit Summary has been reviewed and is understood.  Full responsibility of the confidentiality of this discharge information lies with you and/or your care-partner.  

## 2022-11-12 NOTE — Progress Notes (Signed)
Called to room to assist during endoscopic procedure.  Patient ID and intended procedure confirmed with present staff. Received instructions for my participation in the procedure from the performing physician.  

## 2022-11-12 NOTE — Progress Notes (Signed)
Report to PACU, RN, vss, BBS= Clear.  

## 2022-11-12 NOTE — Op Note (Addendum)
[facility] Patient Name: Herbert Morrison Procedure Date: 11/12/2022 11:12 AM MRN: [id number] Endoscopist: Lorin Picket E. 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 The patient's tolerance of                            previous anesthesia was also reviewed. The risks                            and benefits of the procedure and the sedation                            options and risks were discussed with the patient.                            All questions were answered, and informed consent                            was obtained. Prior Anticoagulants: The patient has                            taken no anticoagulant or antiplatelet agents. ASA                            Grade Assessment: III - A patient with severe                            systemic disease. After reviewing the risks and                            benefits, the patient was deemed in satisfactory                            condition to undergo the procedure.  After obtaining informed consent, the colonoscope                            was passed under direct vision. Throughout the                            procedure, the patient's blood pressure, pulse, and                            oxygen saturations were monitored continuously. The                            CF [id number] #[id number] was introduced through the anus                            and  advanced to the the cecum, identified by                            appendiceal orifice and ileocecal valve. The                            colonoscopy was performed without difficulty. The                            patient tolerated the procedure well. The quality                            of the bowel preparation was adequate. The                            ileocecal valve, appendiceal orifice, and rectum                            were photographed. The bowel preparation used was                            GoLYTELY via split dose instruction. Scope In: 11:38:20 AM Scope Out: 12:29:53 PM Scope Withdrawal Time: 0 hours 46 minutes 35 seconds  Total Procedure Duration: 0 hours 51 minutes 33 seconds  Findings:                 The perianal and digital rectal examinations were                            normal. Pertinent negatives include normal                            sphincter tone and no palpable rectal lesions.                           Many flat, non-bleeding polyps were found in the                            descending colon, transverse colon, ascending colon  and cecum. Seven polyps were seen in the cecum                            alone, with the largest likely 25-30 cm. There was                            an indistinct area of abnormal appearing mucosa                            without discrete borders in the cecum. This was                            biopsied. Six large polyps were seen in the                            transverse colon, with several likely over 2 cm.                            One representative polyp in the ascending colon was                            removed with cold snare. This polyp was about 10 mm                            in size. Resection and retrieval was complete.                            Estimated blood loss was minimal.                           Normal mucosa was found in the entire colon without                             evidence of active colitis. Four biopsies were                            taken every 10 cm with a cold forceps from the                            cecum, ascending colon, transverse colon,                            descending colon, sigmoid colon and rectum for                            ulcerative colitis surveillance. These biopsy                            specimens were sent to Pathology. Estimated blood                            loss was minimal. One the biopsy sites in the  transverse colon was subsequently found to be a                            subtle large polyp.                           A 3 mm polyp was found in the transverse colon. The                            polyp was sessile. The polyp was removed with a                            cold snare. Resection and retrieval were complete.                            Estimated blood loss was minimal.                           A 15 mm polyp was found in the rectum. The polyp                            was flat. The polyp was removed with a piecemeal                            technique using a cold snare. Resection and                            retrieval were complete. There was brisk bleeding                            following the polypectomy. The area was                            successfully injected with 2 mL of a 0.1 mg/mL                            solution of epinephrine for hemostasis of bleeding                            caused by the procedure. Bleeding slowed, but did                            not stop. To prevent further bleeding after the                            polypectomy, one hemostatic clip was successfully                            placed (MR conditional). Clip manufacturer:                            [employer]. There was no bleeding following  hemoclip placement.                           Many large-mouthed and small-mouthed diverticula                             were found in the sigmoid colon and descending                            colon.                           The exam was otherwise normal throughout the                            examined colon.                           The retroflexed view of the distal rectum and anal                            verge was normal and showed no anal or rectal                            abnormalities. Complications:            No immediate complications. Estimated Blood Loss:     Estimated blood loss was minimal. Impression:               - Many (>20) flat polyps in the descending colon,                            transverse colon, in the ascending colon and in the                            cecum, with several polyps larger than 2 cm and                            many greater than 1 cm. One representative polyp                            was removed with a cold snare. Resected and                            retrieved.                           - Abnormal mucosa in cecum with indistinct borders,                            concerning for dysplasia, biopsied.                           - No evidence of active colitis/inflammation in the  entire examined colon. Biopsied.                           - One 3 mm polyp in the transverse colon, removed                            with a cold snare. Resected and retrieved. 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                           - One 15 mm polyp in the rectum, removed piecemeal                            using a cold snare. Resected and retrieved.                            Treatment not successful. Clip (MR conditional) was                            placed. Clip manufacturer: [employer].                           - Severe diverticulosis in the sigmoid colon and in                            the descending colon.                           - The distal rectum and  anal verge are normal on                            retroflexion view. Recommendation:           - Patient has a contact number available for                            emergencies. The signs and symptoms of potential                            delayed complications were discussed with the                            patient. Return to normal activities tomorrow.                            Written discharge instructions were provided to the                            patient.                           - Resume previous diet.                           -  Continue present medications.                           - Await pathology results.                           - Assuming the polyp in the ascending colon or the                            biopsies of the cecum are dysplastic, patient                            should be considered for prophylactic colectomy. If                            colectomy not pursued, patient will need to be                            scheduled for colonoscopy in the hospital for EMR                            of the numerous remaining polyps and then will need                            to undergo intensive endoscopic surveillance. Calvin Chura E. Tomasa Rand, MD 11/12/2022 12:57:56 PM This report has been signed electronically.

## 2022-11-12 NOTE — Telephone Encounter (Signed)
error 

## 2022-11-14 ENCOUNTER — Telehealth: Payer: Self-pay

## 2022-11-14 NOTE — Telephone Encounter (Signed)
  Follow up Call-     11/12/2022   10:54 AM 02/07/2022    9:36 AM  Call back number  Post procedure Call Back phone  # [phone]  Permission to leave phone message Yes Yes     Patient questions:  Do you have a fever, pain , or abdominal swelling? No. Pain Score  0 *  Have you tolerated food without any problems? Yes.    Have you been able to return to your normal activities? Yes.    Do you have any questions about your discharge instructions: Diet   No. Medications  No. Follow up visit  No.  Do you have questions or concerns about your Care? No.  Actions: * If pain score is 4 or above: No action needed, pain <4.

## 2022-11-26 NOTE — Progress Notes (Signed)
I spoke with the patient today on the phone and reviewed the results with him.  The polyp that I resected from the right colon was a sessile serrated polyp, which is considered precancerous.  He has numerous of these polyps in his right colon, some of which are quite large (greater than 2 cm).  We discussed endoscopic surveillance versus colectomy.  Given his age and comorbidities, a colectomy would be quite risky. Although the patient initially had told me he would not want to undergo another colonoscopy, he states that he would be willing, as long as he did not have to do the same bowel prep.  I suggested a pill based prep would be tolerable, and he was agreeable to this. I think a repeat colonoscopy with extensive resection of many of the larger polyps is reasonable.  If the amount of dysplasia seems overwhelming, then we can reconsider surgical referral.  The patient was agreeable to undergoing multiple repeat colonoscopies, so we will plan the strategy first.  We will plan for a colonoscopy in the hospital setting due to anticipated long duration of case and removal of multiple large polyps.  Maya, can you please add Herbert Morrison on to our hospital list for high-priority colonoscopy within the next 4-5 months?

## 2022-12-12 ENCOUNTER — Other Ambulatory Visit: Payer: Self-pay

## 2022-12-12 ENCOUNTER — Telehealth: Payer: Self-pay

## 2022-12-12 DIAGNOSIS — D122 Benign neoplasm of ascending colon: Secondary | ICD-10-CM

## 2022-12-12 DIAGNOSIS — D128 Benign neoplasm of rectum: Secondary | ICD-10-CM

## 2022-12-12 DIAGNOSIS — D123 Benign neoplasm of transverse colon: Secondary | ICD-10-CM

## 2022-12-12 MED ORDER — SUTAB 1479-225-188 MG PO TABS: 24.0000 | ORAL_TABLET | Freq: Once | ORAL | 0 refills | Status: AC

## 2022-12-12 NOTE — Telephone Encounter (Signed)
Contacted patient and went over Dr.Cunningham's hospital schedule. Patient picked 10/8. Patient stated he preferred to have sutab instead of Golytely.

## 2023-02-10 ENCOUNTER — Encounter (HOSPITAL_COMMUNITY): Payer: Self-pay | Admitting: Gastroenterology

## 2023-02-11 ENCOUNTER — Other Ambulatory Visit: Payer: Self-pay

## 2023-02-11 ENCOUNTER — Telehealth: Payer: Self-pay | Admitting: Gastroenterology

## 2023-02-11 MED ORDER — SUTAB 1479-225-188 MG PO TABS: 24.0000 | ORAL_TABLET | Freq: Once | ORAL | 0 refills | Status: AC

## 2023-02-11 NOTE — Telephone Encounter (Signed)
Patient called stated he does not ave his prep medication for upcoming hospital procedure.

## 2023-02-13 NOTE — Telephone Encounter (Signed)
Inbound call from patient, states he would like pill prep medication instead of Golyetly. In addition, patient advises he has a kidney stone and does not want this to interfere with his procedure.

## 2023-02-16 ENCOUNTER — Telehealth: Payer: Self-pay | Admitting: Gastroenterology

## 2023-02-16 NOTE — Telephone Encounter (Signed)
Inbound call from [facility] stated coupon sent to them was for [facility] and prescription sent was for Sutab. Stated they needed a coupon for Sutab. Also stated if the medication that was supposed to be sent was Suflave they do not carry that medication. Patient's procedure is tomorrow.

## 2023-02-16 NOTE — Telephone Encounter (Signed)
Called pharmacy with a coupon for Suflave prep kit.    Tried to contact patient and there was no answer and voice mail was full. But pharmacy has coupon information

## 2023-02-16 NOTE — Telephone Encounter (Signed)
Called [facility]. Looks like we sent a prescription for Sutab but coupon codes for Suflave.  Patient has instructions for Suflave. [facility] does not carry Suflave but they have Sutab in stock and patient could pick up today.  The cost is over $175 Called and spoke to the patient. 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  He indicted that he had also been in the ER recently with kidney stones but they are stable for now and he is not in pain.  He claims his wife already picked up prep script for tablets but he is not sure the name of the prescription.  I will call wife to confirm she picked up Sutab. New instructions for Sutab will be sent to his MyChart. He said he will try to get into his MyChart to verify he can get them.  We confirmed his email , [email or web address] in case we need to email him the instructions.  I instructed him to throw away his instructions for Suflave.

## 2023-02-16 NOTE — Telephone Encounter (Signed)
Spoke with the patient he has no pain and feels ok now. Per Dr Leone Payor as long as he has no pain or passing a stone he may continue with the procedure  Prep and prep instructions  confusion fixed

## 2023-02-16 NOTE — Telephone Encounter (Signed)
Inbound call from patient requesting a call to discuss when he should take prep medication. Patient also stated he has a kidney stone and requesting to know if he is able to proceed with colonoscopy. Please advise, thank you.

## 2023-02-16 NOTE — Telephone Encounter (Signed)
As long as he is not passing a kidney stone now he may proceed  The prep instructions are outlined in 10/2 letter that was sent by My Chart

## 2023-02-16 NOTE — Telephone Encounter (Signed)
Dr Leone Payor you are Doc of the Day..This is [facility] patient and he is off today. Can you answer this patients question Please Thanks   Oh BTY his procedure is tomorrow

## 2023-02-16 NOTE — Telephone Encounter (Signed)
Inbound call from patient requesting to know if there is a coupon available for prep medication for tomorrow's 10/8 colonoscopy. Please advise, thank you.

## 2023-02-17 ENCOUNTER — Other Ambulatory Visit: Payer: Self-pay

## 2023-02-17 ENCOUNTER — Ambulatory Visit (HOSPITAL_COMMUNITY)
Admission: RE | Admit: 2023-02-17 | Discharge: 2023-02-17 | Disposition: A | Discharge status: Home / Self Care | Payer: Medicare Other | Attending: Gastroenterology | Admitting: Gastroenterology

## 2023-02-17 ENCOUNTER — Telehealth: Payer: Self-pay

## 2023-02-17 ENCOUNTER — Encounter (HOSPITAL_COMMUNITY)
Admission: RE | Disposition: A | Discharge status: Home / Self Care | Payer: Self-pay | Source: Home / Self Care | Attending: Gastroenterology

## 2023-02-17 ENCOUNTER — Ambulatory Visit (HOSPITAL_COMMUNITY): Payer: Medicare Other | Admitting: Anesthesiology

## 2023-02-17 ENCOUNTER — Encounter (HOSPITAL_COMMUNITY): Payer: Self-pay | Admitting: Gastroenterology

## 2023-02-17 DIAGNOSIS — K219 Gastro-esophageal reflux disease without esophagitis: Secondary | ICD-10-CM | POA: Diagnosis not present

## 2023-02-17 DIAGNOSIS — I10 Essential (primary) hypertension: Secondary | ICD-10-CM

## 2023-02-17 DIAGNOSIS — K5190 Ulcerative colitis, unspecified, without complications: Secondary | ICD-10-CM | POA: Insufficient documentation

## 2023-02-17 DIAGNOSIS — D122 Benign neoplasm of ascending colon: Secondary | ICD-10-CM | POA: Diagnosis not present

## 2023-02-17 DIAGNOSIS — K635 Polyp of colon: Secondary | ICD-10-CM | POA: Diagnosis present

## 2023-02-17 DIAGNOSIS — Z6835 Body mass index (BMI) 35.0-35.9, adult: Secondary | ICD-10-CM | POA: Insufficient documentation

## 2023-02-17 DIAGNOSIS — K5730 Diverticulosis of large intestine without perforation or abscess without bleeding: Secondary | ICD-10-CM | POA: Diagnosis not present

## 2023-02-17 DIAGNOSIS — Z96652 Presence of left artificial knee joint: Secondary | ICD-10-CM | POA: Insufficient documentation

## 2023-02-17 DIAGNOSIS — K5100 Ulcerative (chronic) pancolitis without complications: Secondary | ICD-10-CM | POA: Diagnosis present

## 2023-02-17 DIAGNOSIS — D120 Benign neoplasm of cecum: Secondary | ICD-10-CM

## 2023-02-17 DIAGNOSIS — D123 Benign neoplasm of transverse colon: Secondary | ICD-10-CM | POA: Diagnosis not present

## 2023-02-17 DIAGNOSIS — Z860100 Personal history of colon polyps, unspecified: Secondary | ICD-10-CM

## 2023-02-17 DIAGNOSIS — Z87891 Personal history of nicotine dependence: Secondary | ICD-10-CM | POA: Diagnosis not present

## 2023-02-17 DIAGNOSIS — D126 Benign neoplasm of colon, unspecified: Secondary | ICD-10-CM

## 2023-02-17 HISTORY — PX: POLYPECTOMY: SHX5525

## 2023-02-17 HISTORY — PX: HEMOSTASIS CLIP PLACEMENT: SHX6857

## 2023-02-17 HISTORY — PX: SUBMUCOSAL LIFTING INJECTION: SHX6855

## 2023-02-17 HISTORY — PX: COLONOSCOPY WITH PROPOFOL: SHX5780

## 2023-02-17 SURGERY — COLONOSCOPY WITH PROPOFOL
Anesthesia: Monitor Anesthesia Care

## 2023-02-17 MED ORDER — LACTATED RINGERS IV SOLN: INTRAVENOUS | Status: DC

## 2023-02-17 MED ORDER — PROPOFOL 500 MG/50ML IV EMUL
INTRAVENOUS | Status: DC | PRN
  Administered 2023-02-17: 100 ug/kg/min via INTRAVENOUS

## 2023-02-17 MED ORDER — PROPOFOL 10 MG/ML IV BOLUS
INTRAVENOUS | Status: DC | PRN
  Administered 2023-02-17 (×2): 20 mg via INTRAVENOUS

## 2023-02-17 MED ORDER — PROPOFOL 1000 MG/100ML IV EMUL
INTRAVENOUS | Status: AC
  Filled 2023-02-17: qty 100

## 2023-02-17 MED ORDER — LIDOCAINE 2% (20 MG/ML) 5 ML SYRINGE
INTRAMUSCULAR | Status: DC | PRN
Start: 2023-02-17 — End: 2023-02-17
  Administered 2023-02-17: 100 mg via INTRAVENOUS

## 2023-02-17 MED ORDER — LACTATED RINGERS IV SOLN: INTRAVENOUS | Status: AC

## 2023-02-17 MED ORDER — PROPOFOL 500 MG/50ML IV EMUL
INTRAVENOUS | Status: AC
  Filled 2023-02-17: qty 50

## 2023-02-17 SURGICAL SUPPLY — 22 items

## 2023-02-17 NOTE — Telephone Encounter (Signed)
-----   Message from Jenel Lucks sent at 02/17/2023 11:59 AM EDT ----- Regarding: Colorectal surgery referral and [facility] referral Bonita Quin,  Can you please place a referral to [facility] (Dr. Cliffton Asters) for consideration of prophylactic colectomy for this patient with long standing ulcerative colitis and numerous, large indistinct flat polyps in the right colon?  Can you please place a referral to [facility] (unsure if this would go IBD or therapeutic GI) for second opinion of feasibility of endoscopic management of surveillance of colon dysplasia?  Thanks

## 2023-02-17 NOTE — Anesthesia Postprocedure Evaluation (Signed)
Anesthesia Post Note  Patient: AVIEL DAVALOS  Procedure(s) Performed: COLONOSCOPY WITH PROPOFOL POLYPECTOMY SUBMUCOSAL LIFTING INJECTION HEMOSTASIS CONTROL HEMOSTASIS CLIP PLACEMENT     Patient location during evaluation: Endoscopy Anesthesia Type: MAC Level of consciousness: awake and alert Pain management: pain level controlled Vital Signs Assessment: post-procedure vital signs reviewed and stable Respiratory status: spontaneous breathing, nonlabored ventilation and respiratory function stable Cardiovascular status: stable and blood pressure returned to baseline Postop Assessment: no apparent nausea or vomiting Anesthetic complications: no  No notable events documented.  Last Vitals:  Vitals:   02/17/23 1140 02/17/23 1150  BP: (!) 128/57 (!) 143/59  Pulse: (!) 48 (!) 49  Resp: 16 14  Temp:    SpO2: 97% 94%    Last Pain:  Vitals:   02/17/23 1150  TempSrc:   PainSc: 0-No pain                 Kimmy Parish,W. EDMOND

## 2023-02-17 NOTE — Transfer of Care (Signed)
Immediate Anesthesia Transfer of Care Note  Patient: Herbert Morrison  Procedure(s) Performed: COLONOSCOPY WITH PROPOFOL POLYPECTOMY SUBMUCOSAL LIFTING INJECTION HEMOSTASIS CONTROL HEMOSTASIS CLIP PLACEMENT  Patient Location: PACU  Anesthesia Type:MAC  Level of Consciousness: sedated  Airway & Oxygen Therapy: Patient Spontanous Breathing and Patient connected to face mask oxygen  Post-op Assessment: Report given to RN and Post -op Vital signs reviewed and stable  Post vital signs: Reviewed and stable  Last Vitals:  Vitals Value Taken Time  BP 115/39 02/17/23 1110  Temp 36 C 02/17/23 1110  Pulse 54 02/17/23 1112  Resp 12 02/17/23 1112  SpO2 97 % 02/17/23 1112  Vitals shown include unfiled device data.  Last Pain:  Vitals:   02/17/23 1110  TempSrc: Temporal  PainSc: Asleep         Complications: No notable events documented.

## 2023-02-17 NOTE — H&P (Signed)
[facility] History and Physical   Primary Care Physician:  Arlina Robes, MD   Reason for Procedure:   Resection of multiple large colon polyps  Plan:    Colonoscopy with EMR     HPI: Herbert Morrison is a 77 y.o. male with long standing ulcerative colitis undergoing colonoscopy to removed multiple large colon polyps found on colonoscopy in July 2024.  Although prophylactic colectomy was considered given the extensive number size of the polyps found, we elected to proceed with endoscopic therapy given his operative risk from his age and comorbidities.  Will likely not plan on removing all polyps today, and patient will need intensive endoscopic surveillance   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   Depression    Dyspnea    GERD (gastroesophageal reflux disease)    Heart murmur    as a child   Hiatal hernia    History of hiatal hernia    Hypertension    Lupus 12/31/2020   Rheumatoid arthritis (HCC) 12/31/2020   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[facility])     Past Surgical History:  Procedure Laterality Date   BIOPSY  01/30/2021   Procedure: BIOPSY;  Surgeon: Malissa Hippo, MD;  [facility];  Service: Endoscopy;;   CATARACT EXTRACTION Left 2023   COLONOSCOPY N/A 04/09/2016   Procedure: COLONOSCOPY;  Surgeon: Malissa Hippo, MD;  [facility];  Service: Endoscopy;  Laterality: N/A;  1:45   COLONOSCOPY WITH PROPOFOL N/A 01/30/2021   Procedure: COLONOSCOPY WITH PROPOFOL;  Surgeon: Malissa Hippo, MD;  [facility];  Service: Endoscopy;  Laterality: N/A;   ELBOW BURSA SURGERY Left 1983   ESOPHAGOGASTRODUODENOSCOPY (EGD) WITH PROPOFOL N/A 01/30/2021   Procedure: ESOPHAGOGASTRODUODENOSCOPY (EGD) WITH PROPOFOL;  Surgeon: Malissa Hippo, MD;  [facility];  Service: Endoscopy;  Laterality: N/A;  2:00, pt knows to arrive at 11:00   KNEE  ARTHROSCOPY Right 1979   left elbow     spur removed   left knee surgery     open surgery for a meniscus tear 1970's   lungs  07/2022   POLYPECTOMY  04/09/2016   Procedure: POLYPECTOMY;  Surgeon: Malissa Hippo, MD;  [facility];  Service: Endoscopy;;  cecal polyp   POLYPECTOMY  01/30/2021   Procedure: POLYPECTOMY;  Surgeon: Malissa Hippo, MD;  [facility];  Service: Endoscopy;;   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   Procedure: LEFT TOTAL HIP ARTHROPLASTY ANTERIOR APPROACH;  Surgeon: Durene Romans, MD;  Location: [facility];  Service: Orthopedics;  Laterality: Left;  70 mins   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   Procedure: TOTAL KNEE ARTHROPLASTY;  Surgeon: Durene Romans, MD;  Location: [facility];  Service: Orthopedics;  Laterality: Left;  70 mins   UMBILICAL HERNIA REPAIR N/A 05/09/2022   Procedure: PRIMARY UMBILICAL HERNIA REPAIR;  Surgeon: Karie Soda, MD;  Location: [facility];  Service: General;  Laterality: N/A;   XI ROBOTIC ASSISTED PARAESOPHAGEAL HERNIA REPAIR N/A 05/09/2022   Procedure: ROBOTIC REPAIR OF PARAESOPHAGEAL HIATAL HERNIA WITH FUNDOPLICATION;  Surgeon: Karie Soda, MD;  Location: [facility];  Service: General;  Laterality: N/A;  GEN w/ERAS LOCAL    Prior to Admission medications   Medication Sig Start Date End Date Taking? Authorizing Provider  amLODipine-valsartan (EXFORGE) 10-320 MG tablet Take 1 tablet by mouth in the  morning.   Yes [provider]  ANORO ELLIPTA 62.5-25 MCG/ACT AEPB Inhale 1 puff into the lungs daily. 10/13/22  Yes [provider]  buPROPion (WELLBUTRIN XL) 300 MG 24 hr tablet Take 300 mg by mouth in the morning.   Yes [provider]  cyclobenzaprine (FLEXERIL) 10 MG tablet Take 10 mg by mouth 3 (three) times daily as needed for muscle spasms. 08/24/20  Yes [provider]  escitalopram (LEXAPRO) 20 MG tablet Take 20 mg by mouth in the morning.   Yes [provider]  fluticasone  (FLONASE) 50 MCG/ACT nasal spray Place 1 spray into both nostrils daily.   Yes [provider]  hydrALAZINE (APRESOLINE) 50 MG tablet Take 50 mg by mouth in the morning and at bedtime.   Yes [provider]  hydroxychloroquine (PLAQUENIL) 200 MG tablet Take 200 mg by mouth 2 (two) times daily.   Yes [provider]  ibuprofen (ADVIL) 200 MG tablet Take 600 mg by mouth daily as needed for mild pain (Joint inflammation).   Yes [provider]  LORazepam (ATIVAN) 2 MG tablet Take 2 mg by mouth every 6 (six) hours as needed for anxiety.   Yes [provider]  metoprolol succinate (TOPROL-XL) 50 MG 24 hr tablet Take 50 mg by mouth in the morning. Take with or immediately following a meal.   Yes [provider]  predniSONE (DELTASONE) 5 MG tablet Take 10 mg by mouth in the morning. 01/01/21  Yes [provider]  famotidine (PEPCID) 20 MG tablet     [provider]  ketoconazole (NIZORAL) 2 % shampoo Apply 1 Application topically every other day. Every other shower    [provider]  metoCLOPramide (REGLAN) 5 MG tablet     [provider]  omeprazole (PRILOSEC) 20 MG capsule Take 20 mg by mouth in the morning. Patient not taking: Reported on 10/23/2022    [provider]  polyethylene glycol (MIRALAX / GLYCOLAX) 17 g packet Take by mouth. Patient not taking: Reported on 10/23/2022    [provider]  traMADol (ULTRAM) 50 MG tablet Take 1-2 tablets (50-100 mg total) by mouth every 6 (six) hours as needed for moderate pain or severe pain. 05/09/22   Karie Soda, MD  zinc gluconate 50 MG tablet Take 50 mg by mouth in the morning. Patient not taking: Reported on 10/23/2022    [provider]    Current Facility-Administered Medications  Medication Dose Route Frequency Provider Last Rate Last Admin   lactated ringers infusion   Intravenous Continuous Jenel Lucks, MD 10 mL/hr at  02/17/23 8295 New Bag at 02/17/23 0922    Allergies as of 12/12/2022 - Review Complete 11/12/2022  Allergen Reaction Noted   Remicade [infliximab] Anaphylaxis 09/30/2021   Erythromycin Nausea Only 07/17/2014   Grass pollen(k-o-r-t-swt vern) Other (See Comments) 11/05/2021   Pollen extract Other (See Comments) 11/05/2021   Sulfasalazine Nausea And Vomiting 01/23/2021    Family History  Problem Relation Age of Onset   Leukemia Father    Bladder Cancer Father    Stomach cancer Neg Hx    Esophageal cancer Neg Hx    Colon cancer Neg Hx    Pancreatic cancer Neg Hx    Colon polyps Neg Hx    Rectal cancer Neg Hx     Social History   Socioeconomic History   Marital status: Married    Spouse name: Not on file   Number of children: Not on  file   Years of education: Not on file   Highest education level: Not on file  Occupational History   Not on file  Tobacco Use   Smoking status: Former    Current packs/day: 0.00    Average packs/day: 1 pack/day for 5.0 years (5.0 ttl pk-yrs)    Types: Cigarettes    Start date: 33    Quit date: 101    Years since quitting: 49.8   Smokeless tobacco: Former    Types: Chew    Quit date: 01/22/1965   Tobacco comments:    quit 43 years ago  Vaping Use   Vaping status: Never Used  Substance and Sexual Activity   Alcohol use: Not Currently    Comment: occasionally   Drug use: No   Sexual activity: Yes  Other Topics Concern   Not on file  Social History Narrative   Not on file   Social Determinants of Health   Financial Resource Strain: Not on file  Food Insecurity: No Food Insecurity (07/28/2022)   Received from [facility], [facility]   Hunger Vital Sign    Worried About Running Out of Food in the Last Year: Never true    Ran Out of Food in the Last Year: Never true  Transportation Needs: No Transportation Needs (07/28/2022)   Received from [facility], [facility]   PRAPARE - Transportation    Lack of Transportation (Medical): No     Lack of Transportation (Non-Medical): No  Physical Activity: Not on file  Stress: Not on file  Social Connections: Not on file  Intimate Partner Violence: Not At Risk (05/10/2022)   Humiliation, Afraid, Rape, and Kick questionnaire    Fear of Current or Ex-Partner: No    Emotionally Abused: No    Physically Abused: No    Sexually Abused: No    Review of Systems:  All other review of systems negative except as mentioned in the HPI.  Physical Exam: Vital signs BP (!) 171/87   Pulse (!) 54   Temp (!) 97.5 F (36.4 C) (Temporal)   Resp 14   Ht 5\' 6"  (1.676 m)   Wt 99.8 kg   SpO2 96%   BMI 35.51 kg/m   General:   Alert,  Well-developed, well-nourished, pleasant and cooperative in NAD Airway:  Mallampati 2 Lungs:  Clear throughout to auscultation.   Heart:  Regular rate and rhythm; no murmurs, clicks, rubs,  or gallops. Abdomen:  Soft, nontender and nondistended. Normal bowel sounds.   Neuro/Psych:  Normal mood and affect. A and O x 3   Ellen Mayol E. Tomasa Rand, MD [facility]

## 2023-02-17 NOTE — Op Note (Addendum)
[facility] Patient Name: Herbert Morrison Procedure Date: 02/17/2023 MRN: [id number] Attending MD: Dub Amis. Tomasa Rand , MD, [id number] Date of Birth: [birth year] CSN: [id number] Age: 77 Admit Type: Outpatient Procedure:                Colonoscopy Indications:              Therapeutic procedure for known colon polyps; Long                            standing ulcerative colitis, underwent surveillance                            colonoscopy in July (my first colonoscopy with him)                            and found to have numerous large flat polyps in the                            cecum, ascending colon and transverse colon.                            Discussed prophylactic colectomy, but elected for                            intensive endoscopic surveillance and polypectomies                            as feasible. Providers:                Dub Amis. Tomasa Rand, MD, Marge Duncans, RN,                            Rozetta Nunnery, Technician Referring MD:              Medicines:                Monitored Anesthesia Care Complications:            No immediate complications. Estimated Blood Loss:     Estimated blood loss was minimal. Procedure:                Pre-Anesthesia Assessment:                           - Prior to the procedure, a History and Physical                            was performed, and patient medications and                            allergies were reviewed. The patient's tolerance of                            previous anesthesia was also reviewed. The risks                            and benefits of the  procedure and the sedation                            options and risks were discussed with the patient.                            All questions were answered, and informed consent                            was obtained. Prior Anticoagulants: The patient has                            taken no anticoagulant or antiplatelet agents. ASA                             Grade Assessment: III - A patient with severe                            systemic disease. After reviewing the risks and                            benefits, the patient was deemed in satisfactory                            condition to undergo the procedure.                           After obtaining informed consent, the colonoscope                            was passed under direct vision. Throughout the                            procedure, the patient's blood pressure, pulse, and                            oxygen saturations were monitored continuously. The                            CF-HQ190L ([id number]) Olympus colonoscope was                            introduced through the anus and advanced to the the                            cecum, identified by appendiceal orifice and                            ileocecal valve. The colonoscopy was performed                            without difficulty. The patient tolerated the  procedure well. The quality of the bowel                            preparation was adequate. The ileocecal valve,                            appendiceal orifice, and rectum were photographed.                            The bowel preparation used was SUTAB via split dose                            instruction. Scope In: 9:47:52 AM Scope Out: 11:04:21 AM Scope Withdrawal Time: 1 hour 10 minutes 36 seconds  Total Procedure Duration: 1 hour 16 minutes 29 seconds  Findings:      The perianal and digital rectal examinations were normal. Pertinent       negatives include normal sphincter tone and no palpable rectal lesions.      A 15 mm polyp was found in the cecum. The polyp was flat. The polyp was       successfully injected with 5 mL EverLift for a lift polypectomy. The       polyp was removed en bloc with a hot snare. Resection and retrieval were       complete. To prevent bleeding after the polypectomy, three hemostatic       clips  were successfully placed (MR conditional). Clip manufacturer:       [employer]. There was no bleeding at the end of the maneuver.      A 20 mm polyp was found in the cecum in close proximity to the polyp       mentioned above. The polyp was flat. The polyp was successfully injected       with 7 mL EverLift for a lift polypectomy. Estimated blood loss was       minimal. The polyp was removed piecemeal with a hot snare. Resection and       retrieval were complete. To prevent bleeding after the polypectomy, five       hemostatic clips were successfully placed (MR conditional). Clip       manufacturer: [employer]. There was no bleeding at the end of the maneuver.      A 12 mm polyp was found in the ascending colon. The polyp was flat. The       polyp was removed with a hot snare. Resection and retrieval were       complete. Estimated blood loss: none.      An 18 mm polyp was found in the transverse colon. The polyp was flat.       The polyp was successfully injected with 4 mL EverLift for a lift       polypectomy. The polyp was removed en bloc with a hot snare. Resection       and retrieval were complete. To prevent bleeding after the polypectomy,       two hemostatic clips were successfully placed (MR conditional). Clip       manufacturer: [employer]. There was no bleeding at the end of the maneuver.      At least 3 additional flat polyps were found in the cecum. The polyps       were 10  to 30 mm in size. These polyps were not removed      Multiple (at least 5) flat polyps were found in the transverse colon.       The borders of some of these polyps were quite indistinct, but at least       one of the polyps seemed to involve about 30-40% of the colon wall,       likely 4-5 cm in size.      Many medium-mouthed and small-mouthed diverticula were found in the       sigmoid colon and descending colon.      The exam was otherwise normal throughout the examined colon.      The retroflexed view of  the distal rectum and anal verge was normal and       showed no anal or rectal abnormalities. Impression:               - One 15 mm polyp in the cecum, removed with a hot                            snare. Resected and retrieved. Injected. Clips (MR                            conditional) were placed. Clip manufacturer: [employer].                           - One 20 mm polyp in the cecum. Injected. Clips (MR                            conditional) were placed.                           - One 12 mm polyp in the ascending colon, removed                            with a hot snare. Resected and retrieved.                           - One 18 mm polyp in the transverse colon, removed                            with a hot snare. Resected and retrieved. Injected.                            Clips (MR conditional) were placed.                           - Numerous addtional flat, large and indistinct                            polyps in the right colon. Given the size and  indistinct borders, endoscopic resection would be                            quite difficult and high risk.                           - Moderate diverticulosis in the sigmoid colon and                            in the descending colon.                           - The distal rectum and anal verge are normal on                            retroflexion view. Moderate Sedation:      Not Applicable - Patient had care per Anesthesia. Recommendation:           - Patient has a contact number available for                            emergencies. The signs and symptoms of potential                            delayed complications were discussed with the                            patient. Return to normal activities tomorrow.                            Written discharge instructions were provided to the                            patient.                           - Resume previous  diet.                           - No aspirin, ibuprofen, naproxen, or other                            non-steroidal anti-inflammatory drugs for 2 days                            after polyp removal.                           - Await pathology results.                           - Doubtfult that the colon can be completely                            cleared of dysplasia endoscopically given the  extensive and subtle flat polyps.                           - Would recommend reconsideration of prophylactic                            colectomy. If not felt to be a candidate or if                            surgery not desired by patient, would recommend                            further endoscopic resections of polyps be                            performed at center of expertise such as [facility]. Procedure Code(s):        --- Professional ---                           [phone], Colonoscopy, flexible; with removal of                            tumor(s), polyp(s), or other lesion(s) by snare                            technique                           45381, Colonoscopy, flexible; with directed                            submucosal injection(s), any substance Diagnosis Code(s):        --- Professional ---                           D12.0, Benign neoplasm of cecum                           D12.2, Benign neoplasm of ascending colon                           D12.3, Benign neoplasm of transverse colon (hepatic                            flexure or splenic flexure)                           K63.5, Polyp of colon                           K57.30, Diverticulosis of large intestine without                            perforation or abscess without bleeding CPT copyright 2022 American Medical Association. All rights reserved. The codes documented in this report are preliminary and upon coder review may  be revised to meet current compliance requirements. Arun Herrod E. Tomasa Rand,  MD 02/17/2023 11:35:46 AM This report has been signed electronically. Number of Addenda: 0

## 2023-02-17 NOTE — Anesthesia Preprocedure Evaluation (Addendum)
Anesthesia Evaluation  Patient identified by MRN, date of birth, ID band Patient awake    Reviewed: Allergy & Precautions, H&P , NPO status , Patient's Chart, lab work & pertinent test results  Airway Mallampati: III  TM Distance: >3 FB Neck ROM: Full    Dental no notable dental hx. (+) Teeth Intact, Dental Advisory Given   Pulmonary shortness of breath, sleep apnea and Continuous Positive Airway Pressure Ventilation , former smoker   Pulmonary exam normal breath sounds clear to auscultation       Cardiovascular hypertension, Pt. on medications and Pt. on home beta blockers  Rhythm:Regular Rate:Normal     Neuro/Psych   Anxiety Depression    negative neurological ROS     GI/Hepatic Neg liver ROS, hiatal hernia, PUD,GERD  Medicated,,  Endo/Other    Morbid obesity  Renal/GU negative Renal ROS  negative genitourinary   Musculoskeletal  (+) Arthritis , Rheumatoid disorders,    Abdominal   Peds  Hematology negative hematology ROS (+)   Anesthesia Other Findings   Reproductive/Obstetrics negative OB ROS                             Anesthesia Physical Anesthesia Plan  ASA: 3  Anesthesia Plan: MAC   Post-op Pain Management: Minimal or no pain anticipated   Induction: Intravenous  PONV Risk Score and Plan: 1 and Propofol infusion  Airway Management Planned: Natural Airway and Simple Face Mask  Additional Equipment:   Intra-op Plan:   Post-operative Plan:   Informed Consent: I have reviewed the patients History and Physical, chart, labs and discussed the procedure including the risks, benefits and alternatives for the proposed anesthesia with the patient or authorized representative who has indicated his/her understanding and acceptance.     Dental advisory given  Plan Discussed with: CRNA  Anesthesia Plan Comments:        Anesthesia Quick Evaluation

## 2023-02-17 NOTE — Telephone Encounter (Signed)
Referral faxed to [facility] for pt to see Dr. Cliffton Asters for consideration of prophylactic colectomy.  Referral faxed to [facility] for second opinion of feasibility of endoscopic management of surveillance of colon dysplasia. Faxed to [phone].

## 2023-02-17 NOTE — Discharge Instructions (Signed)

## 2023-02-18 ENCOUNTER — Telehealth: Payer: Self-pay | Admitting: Gastroenterology

## 2023-02-18 ENCOUNTER — Other Ambulatory Visit: Payer: Self-pay

## 2023-02-18 NOTE — Telephone Encounter (Signed)
See note below and advise. 

## 2023-02-18 NOTE — Telephone Encounter (Signed)
Inbound call from [facility] requesting a call to discuss referral. States Dr. Cliffton Asters declined to see patient due to previously being a patient of Dr. Michaell Cowing with [facility]. Stated patient did not want to continue seeing Dr. Michaell Cowing and Dr. Cliffton Asters stated they work close together so it would not work out. Dr. Cliffton Asters advised for patient to be referred to [facility] instead. Please advise, thank you.

## 2023-02-19 LAB — SURGICAL PATHOLOGY

## 2023-02-19 NOTE — Progress Notes (Signed)
[name],  The two polyps in the cecum and the ascending colon were all large, flat precancerous polyps.  The large polyp removed in the transverse colon was surprisingly normal colonic tissue.  Although this is somewhat good news, it also confirms the difficulty in distinguishing normal tissue from precancerous polyps in your colon.  Given that you have many more larger, indistinct polyps in your colon, I think it is best that you discuss possible colectomy if you are felt to be a reasonable surgical candidate, and also seek a second opinion from a center of expertise such as [facility].

## 2023-02-19 NOTE — Telephone Encounter (Signed)
Referral faxed to [facility] colorectal surgery dept. Pt notified via mychart.

## 2023-02-21 ENCOUNTER — Encounter (HOSPITAL_COMMUNITY): Payer: Self-pay | Admitting: Gastroenterology

## 2023-03-01 ENCOUNTER — Telehealth: Payer: Self-pay | Admitting: Gastroenterology

## 2023-03-01 NOTE — Telephone Encounter (Signed)
Received a call last evening regarding this patient.  Spouse states he had 2 episodes of bloody stools.  Otherwise no SOB, DOE, lightheadedness, dizziness, syncope, CP, fever, abdominal pain.    Patient underwent colonoscopy on 02/17/2023 with resection of several large, flat polyps, with each of those sites prophylactically clipped closed.  Denies any anticoagulation or antiplatelet therapy.  Had been doing well until 1 episode of BRBPR on 10/18 evening, and another in the afternoon on 10/19.  Discussed possibility of delayed post polypectomy bleed, and recommended going to the ER for expedited evaluation.  They are currently in [place], [place] visiting their son.  I again recommended ER evaluation for at least exam, vitals, labs.  No further calls overnight.  Chart checked this morning and best I can tell no ER evaluations and care everywhere.  Can we please reach out to this patient on Monday to see how he is doing.

## 2023-03-02 DIAGNOSIS — D649 Anemia, unspecified: Secondary | ICD-10-CM | POA: Diagnosis present

## 2023-03-02 NOTE — Telephone Encounter (Signed)
Outgoing call to patient and his wife to see how he was doing.  Patient had another large BM with significant bleeding yesterday morning which lead his wife to take him to [facility] ER in [place], [place] where he is currently admitted.  Patients wife stated the patient did experience dizziness and weakness after his BM.  Patients wife reports his Hgb as 7.4, but he was given blood and it is now at 7.7   Information in Care Everywhere for your review.  Patient and his wife thanked me for the call.  No further questions.

## 2023-04-26 ENCOUNTER — Emergency Department (HOSPITAL_COMMUNITY): Payer: Medicare Other

## 2023-04-26 ENCOUNTER — Inpatient Hospital Stay (HOSPITAL_COMMUNITY)
Admission: EM | Admit: 2023-04-26 | Discharge: 2023-04-28 | DRG: 378 | Disposition: A | Discharge status: Other Acute Inpatient Hospital | Payer: Medicare Other | Attending: Internal Medicine | Admitting: Internal Medicine

## 2023-04-26 ENCOUNTER — Encounter (HOSPITAL_COMMUNITY): Payer: Self-pay | Admitting: Emergency Medicine

## 2023-04-26 DIAGNOSIS — D649 Anemia, unspecified: Secondary | ICD-10-CM | POA: Diagnosis present

## 2023-04-26 DIAGNOSIS — Z7901 Long term (current) use of anticoagulants: Secondary | ICD-10-CM

## 2023-04-26 DIAGNOSIS — K219 Gastro-esophageal reflux disease without esophagitis: Secondary | ICD-10-CM | POA: Diagnosis present

## 2023-04-26 DIAGNOSIS — Z96652 Presence of left artificial knee joint: Secondary | ICD-10-CM | POA: Diagnosis present

## 2023-04-26 DIAGNOSIS — Z6831 Body mass index (BMI) 31.0-31.9, adult: Secondary | ICD-10-CM

## 2023-04-26 DIAGNOSIS — F4310 Post-traumatic stress disorder, unspecified: Secondary | ICD-10-CM | POA: Diagnosis present

## 2023-04-26 DIAGNOSIS — M069 Rheumatoid arthritis, unspecified: Secondary | ICD-10-CM | POA: Diagnosis present

## 2023-04-26 DIAGNOSIS — F32A Depression, unspecified: Secondary | ICD-10-CM | POA: Diagnosis present

## 2023-04-26 DIAGNOSIS — K625 Hemorrhage of anus and rectum: Principal | ICD-10-CM | POA: Diagnosis present

## 2023-04-26 DIAGNOSIS — F419 Anxiety disorder, unspecified: Secondary | ICD-10-CM | POA: Diagnosis present

## 2023-04-26 DIAGNOSIS — Z9842 Cataract extraction status, left eye: Secondary | ICD-10-CM

## 2023-04-26 DIAGNOSIS — Z888 Allergy status to other drugs, medicaments and biological substances status: Secondary | ICD-10-CM

## 2023-04-26 DIAGNOSIS — Z87891 Personal history of nicotine dependence: Secondary | ICD-10-CM

## 2023-04-26 DIAGNOSIS — Z8052 Family history of malignant neoplasm of bladder: Secondary | ICD-10-CM

## 2023-04-26 DIAGNOSIS — N183 Chronic kidney disease, stage 3 unspecified: Secondary | ICD-10-CM | POA: Diagnosis present

## 2023-04-26 DIAGNOSIS — Z860100 Personal history of colon polyps, unspecified: Secondary | ICD-10-CM

## 2023-04-26 DIAGNOSIS — E782 Mixed hyperlipidemia: Secondary | ICD-10-CM | POA: Diagnosis present

## 2023-04-26 DIAGNOSIS — Z9049 Acquired absence of other specified parts of digestive tract: Secondary | ICD-10-CM

## 2023-04-26 DIAGNOSIS — M329 Systemic lupus erythematosus, unspecified: Secondary | ICD-10-CM | POA: Diagnosis present

## 2023-04-26 DIAGNOSIS — I10 Essential (primary) hypertension: Secondary | ICD-10-CM | POA: Diagnosis present

## 2023-04-26 DIAGNOSIS — I700 Atherosclerosis of aorta: Secondary | ICD-10-CM | POA: Diagnosis present

## 2023-04-26 DIAGNOSIS — Z7952 Long term (current) use of systemic steroids: Secondary | ICD-10-CM

## 2023-04-26 DIAGNOSIS — E669 Obesity, unspecified: Secondary | ICD-10-CM | POA: Diagnosis present

## 2023-04-26 DIAGNOSIS — Z7951 Long term (current) use of inhaled steroids: Secondary | ICD-10-CM

## 2023-04-26 DIAGNOSIS — I129 Hypertensive chronic kidney disease with stage 1 through stage 4 chronic kidney disease, or unspecified chronic kidney disease: Secondary | ICD-10-CM | POA: Diagnosis present

## 2023-04-26 DIAGNOSIS — Z881 Allergy status to other antibiotic agents status: Secondary | ICD-10-CM

## 2023-04-26 DIAGNOSIS — I447 Left bundle-branch block, unspecified: Secondary | ICD-10-CM | POA: Diagnosis present

## 2023-04-26 DIAGNOSIS — I2510 Atherosclerotic heart disease of native coronary artery without angina pectoris: Secondary | ICD-10-CM | POA: Diagnosis present

## 2023-04-26 DIAGNOSIS — Z806 Family history of leukemia: Secondary | ICD-10-CM

## 2023-04-26 DIAGNOSIS — D62 Acute posthemorrhagic anemia: Secondary | ICD-10-CM | POA: Diagnosis present

## 2023-04-26 DIAGNOSIS — Z96642 Presence of left artificial hip joint: Secondary | ICD-10-CM | POA: Diagnosis present

## 2023-04-26 DIAGNOSIS — R001 Bradycardia, unspecified: Secondary | ICD-10-CM | POA: Diagnosis present

## 2023-04-26 DIAGNOSIS — Z85828 Personal history of other malignant neoplasm of skin: Secondary | ICD-10-CM

## 2023-04-26 DIAGNOSIS — I5189 Other ill-defined heart diseases: Secondary | ICD-10-CM

## 2023-04-26 DIAGNOSIS — Z79899 Other long term (current) drug therapy: Secondary | ICD-10-CM

## 2023-04-26 LAB — COMPREHENSIVE METABOLIC PANEL
ALT: 15 U/L (ref 0–44)
AST: 19 U/L (ref 15–41)
Albumin: 3.4 g/dL — ABNORMAL LOW (ref 3.5–5.0)
Alkaline Phosphatase: 50 U/L (ref 38–126)
Anion gap: 9 (ref 5–15)
BUN: 23 mg/dL (ref 8–23)
CO2: 18 mmol/L — ABNORMAL LOW (ref 22–32)
Calcium: 9.5 mg/dL (ref 8.9–10.3)
Chloride: 112 mmol/L — ABNORMAL HIGH (ref 98–111)
Creatinine, Ser: 1.63 mg/dL — ABNORMAL HIGH (ref 0.61–1.24)
GFR, Estimated: 44 mL/min — ABNORMAL LOW (ref >60)
Glucose, Bld: 123 mg/dL — ABNORMAL HIGH (ref 70–99)
Potassium: 4 mmol/L (ref 3.5–5.1)
Sodium: 139 mmol/L (ref 135–145)
Total Bilirubin: 0.3 mg/dL (ref <1.2)
Total Protein: 6.3 g/dL — ABNORMAL LOW (ref 6.5–8.1)

## 2023-04-26 LAB — PROTIME-INR
INR: 1.1 (ref 0.8–1.2)
Prothrombin Time: 14.4 s (ref 11.4–15.2)

## 2023-04-26 LAB — CBC WITH DIFFERENTIAL/PLATELET
Abs Immature Granulocytes: 0.04 10*3/uL (ref 0.00–0.07)
Basophils Absolute: 0.1 10*3/uL (ref 0.0–0.1)
Basophils Relative: 1 %
Eosinophils Absolute: 0.3 10*3/uL (ref 0.0–0.5)
Eosinophils Relative: 3 %
HCT: 32.8 % — ABNORMAL LOW (ref 39.0–52.0)
Hemoglobin: 9.6 g/dL — ABNORMAL LOW (ref 13.0–17.0)
Immature Granulocytes: 0 %
Lymphocytes Relative: 19 %
Lymphs Abs: 1.9 10*3/uL (ref 0.7–4.0)
MCH: 23.8 pg — ABNORMAL LOW (ref 26.0–34.0)
MCHC: 29.3 g/dL — ABNORMAL LOW (ref 30.0–36.0)
MCV: 81.4 fL (ref 80.0–100.0)
Monocytes Absolute: 1 10*3/uL (ref 0.1–1.0)
Monocytes Relative: 10 %
Neutro Abs: 6.4 10*3/uL (ref 1.7–7.7)
Neutrophils Relative %: 67 %
Platelets: 492 10*3/uL — ABNORMAL HIGH (ref 150–400)
RBC: 4.03 MIL/uL — ABNORMAL LOW (ref 4.22–5.81)
RDW: 17.6 % — ABNORMAL HIGH (ref 11.5–15.5)
WBC: 9.7 10*3/uL (ref 4.0–10.5)
nRBC: 0 % (ref 0.0–0.2)

## 2023-04-26 LAB — LIPASE, BLOOD: Lipase: 45 U/L (ref 11–51)

## 2023-04-26 MED ORDER — SODIUM CHLORIDE 0.9 % IV BOLUS
1000.0000 mL | Freq: Once | INTRAVENOUS | Status: AC
  Administered 2023-04-26: 1000 mL via INTRAVENOUS

## 2023-04-26 NOTE — ED Triage Notes (Addendum)
Pt having rectal bleeding around 4:30. He is now very weak. He has had a colectomy November 25th. He has had transfusions in the past for bleeding in October. Surgeons is not sure reason for bleeding but possibly clip from polyp removal came loose. Pt appears pale and weak. Denies pain. Wife at bedside good historian. Pt did take eliquis this morning.

## 2023-04-26 NOTE — ED Provider Notes (Incomplete)
EMERGENCY DEPARTMENT AT [facility] Provider Note   CSN: [id number] Arrival date & time: 04/26/23  2152     History {Add pertinent medical, surgical, social history, OB history to HPI:1} Chief Complaint  Patient presents with   Rectal Bleeding    Herbert Morrison is a 77 y.o. male with history of ulcerative colitis, lupus, rheumatoid arthritis chronically on prednisone and Plaquenil who is 3 weeks status post laparoscopic hemicolectomy for ulcerative colitis performed at [facility].  He presents this evening with rectal bleeding that started around 2:00 this afternoon.  According to his wife at the bedside he was having a lupus flare this week (states this occurs when it rains), therefore was extremely fatigued as he does experience during his flares and was primarily in the bed today.  States that this evening began to experience significantly worsened fatigue after the rectal bleeding started.  Patient and his wife described the bleeding as a :significant amount; almost as much as when he required a blood transfusion in October".  Patient's wife states that he was bleeding when they were [place], get him into the hospital had 2 blood transfusions at that time.  Having rectal bleeding both with and without bowel movements.  No abdominal pain.  Patient also endorsing dizziness like the room is spinning and headache.  Patient is anticoagulated on Eliquis postoperatively for DVT prophylaxis, last dose this morning around 7 AM.  Contacted on-call surgeon at [facility], recommended Monday follow-up, however given decline in patient's status at home, wife brought patient to the ED.  Patient did have colonoscopy with Dr. Tiajuana Amass in October 2024 with multiple polyps. HPI     Home Medications Prior to Admission medications   Medication Sig Start Date End Date Taking? Authorizing Provider  amoxicillin (AMOXIL) 500 MG capsule Take 1,000 mg by  mouth 2 (two) times daily. 02/19/23  Yes [provider]  apixaban (ELIQUIS) 5 MG TABS tablet Take 5 mg by mouth 2 (two) times daily. Following recent surgery   Yes [provider]  amLODipine-valsartan (EXFORGE) 10-320 MG tablet Take 1 tablet by mouth in the morning.    [provider]  Ernestina Patches 62.5-25 MCG/ACT AEPB Inhale 1 puff into the lungs daily. 10/13/22   [provider]  buPROPion (WELLBUTRIN XL) 300 MG 24 hr tablet Take 300 mg by mouth in the morning.    [provider]  cyclobenzaprine (FLEXERIL) 10 MG tablet Take 10 mg by mouth 3 (three) times daily as needed for muscle spasms. 08/24/20   [provider]  escitalopram (LEXAPRO) 20 MG tablet Take 20 mg by mouth in the morning.    [provider]  famotidine (PEPCID) 20 MG tablet     [provider]  fluticasone (FLONASE) 50 MCG/ACT nasal spray Place 1 spray into both nostrils daily.    [provider]  hydrALAZINE (APRESOLINE) 50 MG tablet Take 50 mg by mouth in the morning and at bedtime.    [provider]  hydroxychloroquine (PLAQUENIL) 200 MG tablet Take 200 mg by mouth 2 (two) times daily.    [provider]  ketoconazole (NIZORAL) 2 % shampoo Apply 1 Application topically every other day. Every other shower    [provider]  LORazepam (ATIVAN) 2 MG tablet Take 2 mg by mouth every 6 (six) hours as needed for anxiety.    [provider]  metoCLOPramide (REGLAN) 5 MG tablet  [provider]  metoprolol succinate (TOPROL-XL) 50 MG 24 hr tablet Take 50 mg by mouth in the morning. Take with or immediately following a meal.    [provider]  omeprazole (PRILOSEC) 20 MG capsule Take 20 mg by mouth in the morning. Patient not taking: Reported on 10/23/2022    [provider]  ondansetron (ZOFRAN) 4 MG tablet Take 4 mg by mouth every 6 (six) hours as needed.    [provider]   oxyCODONE-acetaminophen (PERCOCET/ROXICET) 5-325 MG tablet Take 2 tablets by mouth every 8 (eight) hours as needed for severe pain (pain score 7-10).    [provider]  polyethylene glycol (MIRALAX / GLYCOLAX) 17 g packet Take by mouth. Patient not taking: Reported on 10/23/2022    [provider]  predniSONE (DELTASONE) 5 MG tablet Take 10 mg by mouth in the morning. 01/01/21   [provider]  tamsulosin (FLOMAX) 0.4 MG CAPS capsule Take 0.4 mg by mouth daily.    [provider]  traMADol (ULTRAM) 50 MG tablet Take 1-2 tablets (50-100 mg total) by mouth every 6 (six) hours as needed for moderate pain or severe pain. 05/09/22   Karie Soda, MD  zinc gluconate 50 MG tablet Take 50 mg by mouth in the morning. Patient not taking: Reported on 10/23/2022    [provider]      Allergies    Remicade [infliximab], Erythromycin, Grass pollen([id number] vern), Pollen extract, and Sulfasalazine    Review of Systems   Review of Systems  Constitutional:  Positive for appetite change and fatigue.  Eyes: Negative.   Gastrointestinal:  Positive for anal bleeding.  Neurological:  Positive for dizziness, weakness and headaches.    Physical Exam Updated Vital Signs BP 99/69 (BP Location: Left Arm)   Pulse (!) 113   Temp 97.6 F (36.4 C) (Oral)   Resp 16   SpO2 99%  Physical Exam Vitals and nursing note reviewed.  Constitutional:      Appearance: He is ill-appearing. He is not toxic-appearing.  HENT:     Head: Normocephalic and atraumatic.     Mouth/Throat:     Mouth: Mucous membranes are moist.     Pharynx: No oropharyngeal exudate or posterior oropharyngeal erythema.  Eyes:     General:        Right eye: No discharge.        Left eye: No discharge.     Extraocular Movements: Extraocular movements intact.     Conjunctiva/sclera: Conjunctivae normal.     Pupils: Pupils are equal, round, and reactive to light.     Comments: Conjunctival  pallor  Cardiovascular:     Rate and Rhythm: Normal rate and regular rhythm.     Pulses: Normal pulses.     Heart sounds: Normal heart sounds. No murmur heard. Pulmonary:     Effort: Pulmonary effort is normal. No respiratory distress.     Breath sounds: Normal breath sounds. No wheezing or rales.  Abdominal:     General: Bowel sounds are normal. There is no distension.     Palpations: Abdomen is soft.     Tenderness: There is no abdominal tenderness. There is no guarding or rebound.  Musculoskeletal:        General: No deformity.     Cervical back: Neck supple.     Right lower leg: No edema.     Left lower leg: No edema.  Skin:    General: Skin is warm and dry.  Capillary Refill: Capillary refill takes less than 2 seconds.  Neurological:     General: No focal deficit present.     Mental Status: He is alert and oriented to person, place, and time. Mental status is at baseline.     GCS: GCS eye subscore is 4. GCS verbal subscore is 5. GCS motor subscore is 6.     Cranial Nerves: Cranial nerves 2-12 are intact.     Sensory: Sensation is intact.     Motor: Motor function is intact.     Coordination: Coordination is intact.     Comments: Gait deferred for patient safety.  Psychiatric:        Mood and Affect: Mood normal.     ED Results / Procedures / Treatments   Labs (all labs ordered are listed, but only abnormal results are displayed) Labs Reviewed  CBC WITH DIFFERENTIAL/PLATELET - Abnormal; Notable for the following components:      Result Value   RBC 4.03 (*)    Hemoglobin 9.6 (*)    HCT 32.8 (*)    MCH 23.8 (*)    MCHC 29.3 (*)    RDW 17.6 (*)    Platelets 492 (*)    All other components within normal limits  COMPREHENSIVE METABOLIC PANEL - Abnormal; Notable for the following components:   Chloride 112 (*)    CO2 18 (*)    Glucose, Bld 123 (*)    Creatinine, Ser 1.63 (*)    Total Protein 6.3 (*)    Albumin 3.4 (*)    GFR, Estimated 44 (*)    All other  components within normal limits  LIPASE, BLOOD  PROTIME-INR  POC OCCULT BLOOD, ED  TYPE AND SCREEN    EKG EKG Interpretation Date/Time:  Sunday April 26 2023 22:13:54 EST Ventricular Rate:  89 PR Interval:  217 QRS Duration:  117 QT Interval:  385 QTC Calculation: 469 R Axis:   -70  Text Interpretation: Sinus rhythm Borderline prolonged PR interval Incomplete left bundle branch block Probable left ventricular hypertrophy Confirmed by Kristine Royal [id number]) on 04/26/2023 10:27:15 PM  Radiology No results found.  Procedures Procedures  {Document cardiac monitor, telemetry assessment procedure when appropriate:1}  Medications Ordered in ED Medications - No data to display  ED Course/ Medical Decision Making/ A&P   {   Click here for ABCD2, HEART and other calculatorsREFRESH Note before signing :1}                              Medical Decision Making 77 year old male who presents with concern for rectal bleeding.  Anticoagulated on Eliquis, history of ulcerative colitis status post colectomy 04/06/2023.  Tachycardic on intake, at time of my arrival to the bedside tachycardia is resolved and heart rate is in the 80s.  Normal blood pressure and oxygen saturation.  Patient is ill-appearing, quite pale, lying motionless in the bed.  Does respond verbally and open eyes to speech.  Nonfocal neurologic exam, well-healing laparoscopic wounds from recent procedure.  Amount and/or Complexity of Data Reviewed External Data Reviewed: labs.    Details: Hemoglobin from 04/09/2023 was 9.3 Labs: ordered.    Details: CBC with 9.6 today, no leukocytosis, platelets of 492.  CMP with creatinine of 1.6, AKI given increase from patient's baseline of 1.1.  Radiology: ordered. ECG/medicine tests:     Details: EKG incomplete left bundle branch, borderline prolonged PR no ischemic changes.  Unchanged from prior.    ***  {  Document critical care time when appropriate:1} {Document review of  labs and clinical decision tools ie heart score, Chads2Vasc2 etc:1}  {Document your independent review of radiology images, and any outside records:1} {Document your discussion with family members, caretakers, and with consultants:1} {Document social determinants of health affecting pt's care:1} {Document your decision making why or why not admission, treatments were needed:1} Final Clinical Impression(s) / ED Diagnoses Final diagnoses:  None    Rx / DC Orders ED Discharge Orders     None

## 2023-04-27 ENCOUNTER — Emergency Department (HOSPITAL_COMMUNITY): Payer: Medicare Other

## 2023-04-27 ENCOUNTER — Other Ambulatory Visit: Payer: Self-pay

## 2023-04-27 LAB — HEMOGLOBIN AND HEMATOCRIT, BLOOD
HCT: 28.2 % — ABNORMAL LOW (ref 39.0–52.0)
Hemoglobin: 8.7 g/dL — ABNORMAL LOW (ref 13.0–17.0)

## 2023-04-27 LAB — CBC
HCT: 24.7 % — ABNORMAL LOW (ref 39.0–52.0)
Hemoglobin: 7.3 g/dL — ABNORMAL LOW (ref 13.0–17.0)
MCH: 24 pg — ABNORMAL LOW (ref 26.0–34.0)
MCHC: 29.6 g/dL — ABNORMAL LOW (ref 30.0–36.0)
MCV: 81.3 fL (ref 80.0–100.0)
Platelets: 372 10*3/uL (ref 150–400)
RBC: 3.04 MIL/uL — ABNORMAL LOW (ref 4.22–5.81)
RDW: 17.6 % — ABNORMAL HIGH (ref 11.5–15.5)
WBC: 11 10*3/uL — ABNORMAL HIGH (ref 4.0–10.5)
nRBC: 0 % (ref 0.0–0.2)

## 2023-04-27 LAB — PREPARE RBC (CROSSMATCH)

## 2023-04-27 MED ORDER — IOHEXOL 350 MG/ML SOLN
80.0000 mL | Freq: Once | INTRAVENOUS | Status: AC | PRN
  Administered 2023-04-27: 80 mL via INTRAVENOUS

## 2023-04-27 MED ORDER — HYDRALAZINE HCL 20 MG/ML IJ SOLN
10.0000 mg | Freq: Once | INTRAMUSCULAR | Status: AC
  Administered 2023-04-27: 10 mg via INTRAVENOUS
  Filled 2023-04-27: qty 1

## 2023-04-27 MED ORDER — METOCLOPRAMIDE HCL 5 MG/ML IJ SOLN
10.0000 mg | Freq: Once | INTRAMUSCULAR | Status: AC
  Administered 2023-04-27: 10 mg via INTRAVENOUS
  Filled 2023-04-27: qty 2

## 2023-04-27 MED ORDER — DIPHENHYDRAMINE HCL 50 MG/ML IJ SOLN
25.0000 mg | Freq: Once | INTRAMUSCULAR | Status: AC
  Administered 2023-04-27: 25 mg via INTRAVENOUS
  Filled 2023-04-27: qty 1

## 2023-04-27 MED ORDER — PANTOPRAZOLE SODIUM 40 MG IV SOLR
40.0000 mg | Freq: Once | INTRAVENOUS | Status: AC
  Administered 2023-04-27: 40 mg via INTRAVENOUS
  Filled 2023-04-27: qty 10

## 2023-04-27 MED ORDER — SODIUM CHLORIDE 0.9 % IV BOLUS
500.0000 mL | Freq: Once | INTRAVENOUS | Status: AC
  Administered 2023-04-27: 500 mL via INTRAVENOUS

## 2023-04-27 MED ORDER — SODIUM CHLORIDE 0.9% IV SOLUTION
Freq: Once | INTRAVENOUS | Status: AC
Start: 2023-04-27 — End: 2023-04-27

## 2023-04-27 MED ORDER — MELATONIN 3 MG PO TABS
3.0000 mg | ORAL_TABLET | Freq: Every day | ORAL | Status: DC
  Administered 2023-04-28: 3 mg via ORAL
  Filled 2023-04-27: qty 1

## 2023-04-27 NOTE — ED Notes (Signed)
Patient transported to CT 

## 2023-04-27 NOTE — ED Notes (Signed)
Pt assisted to [facility]

## 2023-04-27 NOTE — ED Notes (Signed)
RN called from hospital pt is being transferred to, to get an updated set of vitals and report.

## 2023-04-27 NOTE — ED Provider Notes (Signed)
Pt holding for transfer.     Herbert Morrison 04/27/23 1510    Lorre Nick, MD 04/27/23 1536

## 2023-04-27 NOTE — ED Provider Notes (Signed)
Patient is a handoff from Small, PA-C. Patient is pending transfer to [facility] after consult and accepted transfer with Dr. Foster Simpson. Current plan is to recheck H&H to ensure patient not require further blood transfusion. Otherwise appears stable. Physical Exam  BP (!) 153/88   Pulse 79   Temp 98.4 F (36.9 C) (Oral)   Resp 18   Ht 5\' 6"  (1.676 m)   Wt 89.8 kg   SpO2 97%   BMI 31.96 kg/m   Physical Exam  Procedures  Procedures  ED Course / MDM   Clinical Course as of 04/27/23 2352  Mon Apr 27, 2023  0147 Patient passing large blood clots and bright red blood per rectum on bedside commode in the emergency department.  Interval decrease in Hbg to 7.3 doubt dilution given patient only administered single liter of fluid.  Will initiate fluid administration in the emergency department.  Will consult general surgery at [facility]. Patient required admission to the hospital. [RS]  0151 Consult to pharmacist Alvino Chapel, patient barely meets criteria for administration of [RS]  0203 [facility] consult :  colorectal surgeon Dr. Foster Simpson who has accepted the patient in transfer and is requesting CTA in the interim. I appreciate his collaboration in the care of this patient.  [RS]    Clinical Course User Index [RS] Sponseller, Eugene Gavia, PA-C   Medical Decision Making Amount and/or Complexity of Data Reviewed Labs: ordered. Radiology: ordered.  Risk OTC drugs. Prescription drug management.   Patient is a handoff from Small, PA-C. Patient initially seen by Sponsellar, PA-C yesterday for concerns and had notable anemia following rectal bleeding.  Patient is pending transfer to [facility] after consult and accepted transfer with Dr. Foster Simpson. Current plan is to recheck H&H to ensure patient not require further blood transfusion. Otherwise appears stable.  H&H appears stable compared to most recent post-transfusion. Not recurrent episodes of rectal bleeding. Spoke with nurse who states that  he has not received any further updates regarding placement for inpatient admission to [facility]. Given delay in care, will handoff this patient with plan for consult to hospitalist admission for rounding as patient has remained in the ED for 30+ hours now.        Smitty Knudsen, PA-C 04/28/23 2725    Tilden Fossa, MD 04/28/23 [phone]

## 2023-04-27 NOTE — ED Provider Notes (Signed)
Handoff from [employer] PA. Pt waiting for transfer to [facility] for GI bleed. Previously had procedure there for GI bleed.   ED Course / MDM   Clinical Course as of 04/27/23 1508  Mon Apr 27, 2023  0147 Patient passing large blood clots and bright red blood per rectum on bedside commode in the emergency department.  Interval decrease in Hbg to 7.3 doubt dilution given patient only administered single liter of fluid.  Will initiate fluid administration in the emergency department.  Will consult general surgery at [facility]. Patient required admission to the hospital. [RS]  0151 Consult to pharmacist Alvino Chapel, patient barely meets criteria for administration of [RS]  0203 [facility] consult :  colorectal surgeon Dr. Foster Simpson who has accepted the patient in transfer and is requesting CTA in the interim. I appreciate his collaboration in the care of this patient.  [RS]    Clinical Course User Index [RS] Sponseller, Eugene Gavia, PA-C   Medical Decision Making Amount and/or Complexity of Data Reviewed Labs: ordered. Radiology: ordered.  Risk OTC drugs. Prescription drug management.   Patient doing well, was requesting a little bit of a antihypertensive, that was ordered.  Still waiting on bed placement.  Have had nursing check twice, and they state they will likely be tomorrow.  I ordered hemoglobin, check, which has not been completed, I have messaged the nurse repeatedly about this, oncoming provider, will follow-up on this.  And recheck, on timing of placement.     Pete Pelt, [place] 04/27/23 2347    Glendora Score, MD 04/28/23 [phone]

## 2023-04-28 ENCOUNTER — Encounter (HOSPITAL_COMMUNITY): Payer: Self-pay | Admitting: Internal Medicine

## 2023-04-28 DIAGNOSIS — Z96652 Presence of left artificial knee joint: Secondary | ICD-10-CM | POA: Diagnosis present

## 2023-04-28 DIAGNOSIS — N183 Chronic kidney disease, stage 3 unspecified: Secondary | ICD-10-CM | POA: Diagnosis present

## 2023-04-28 DIAGNOSIS — E782 Mixed hyperlipidemia: Secondary | ICD-10-CM | POA: Diagnosis present

## 2023-04-28 DIAGNOSIS — Z9842 Cataract extraction status, left eye: Secondary | ICD-10-CM | POA: Diagnosis not present

## 2023-04-28 DIAGNOSIS — F32A Depression, unspecified: Secondary | ICD-10-CM | POA: Diagnosis present

## 2023-04-28 DIAGNOSIS — I129 Hypertensive chronic kidney disease with stage 1 through stage 4 chronic kidney disease, or unspecified chronic kidney disease: Secondary | ICD-10-CM | POA: Diagnosis present

## 2023-04-28 DIAGNOSIS — I700 Atherosclerosis of aorta: Secondary | ICD-10-CM | POA: Diagnosis present

## 2023-04-28 DIAGNOSIS — Z85828 Personal history of other malignant neoplasm of skin: Secondary | ICD-10-CM | POA: Diagnosis not present

## 2023-04-28 DIAGNOSIS — D62 Acute posthemorrhagic anemia: Secondary | ICD-10-CM | POA: Diagnosis present

## 2023-04-28 DIAGNOSIS — K625 Hemorrhage of anus and rectum: Secondary | ICD-10-CM | POA: Diagnosis present

## 2023-04-28 DIAGNOSIS — Z806 Family history of leukemia: Secondary | ICD-10-CM | POA: Diagnosis not present

## 2023-04-28 DIAGNOSIS — Z9049 Acquired absence of other specified parts of digestive tract: Secondary | ICD-10-CM | POA: Diagnosis not present

## 2023-04-28 DIAGNOSIS — E669 Obesity, unspecified: Secondary | ICD-10-CM | POA: Diagnosis present

## 2023-04-28 DIAGNOSIS — M069 Rheumatoid arthritis, unspecified: Secondary | ICD-10-CM | POA: Diagnosis present

## 2023-04-28 DIAGNOSIS — Z79899 Other long term (current) drug therapy: Secondary | ICD-10-CM | POA: Diagnosis not present

## 2023-04-28 DIAGNOSIS — Z888 Allergy status to other drugs, medicaments and biological substances status: Secondary | ICD-10-CM | POA: Diagnosis not present

## 2023-04-28 DIAGNOSIS — I5189 Other ill-defined heart diseases: Secondary | ICD-10-CM

## 2023-04-28 DIAGNOSIS — Z96642 Presence of left artificial hip joint: Secondary | ICD-10-CM | POA: Diagnosis present

## 2023-04-28 DIAGNOSIS — K219 Gastro-esophageal reflux disease without esophagitis: Secondary | ICD-10-CM | POA: Diagnosis present

## 2023-04-28 DIAGNOSIS — I2510 Atherosclerotic heart disease of native coronary artery without angina pectoris: Secondary | ICD-10-CM | POA: Diagnosis present

## 2023-04-28 DIAGNOSIS — F419 Anxiety disorder, unspecified: Secondary | ICD-10-CM | POA: Diagnosis present

## 2023-04-28 DIAGNOSIS — Z7901 Long term (current) use of anticoagulants: Secondary | ICD-10-CM | POA: Diagnosis not present

## 2023-04-28 DIAGNOSIS — Z881 Allergy status to other antibiotic agents status: Secondary | ICD-10-CM | POA: Diagnosis not present

## 2023-04-28 DIAGNOSIS — M329 Systemic lupus erythematosus, unspecified: Secondary | ICD-10-CM | POA: Diagnosis present

## 2023-04-28 DIAGNOSIS — Z87891 Personal history of nicotine dependence: Secondary | ICD-10-CM | POA: Diagnosis not present

## 2023-04-28 HISTORY — DX: Other ill-defined heart diseases: I51.89

## 2023-04-28 LAB — COMPREHENSIVE METABOLIC PANEL
ALT: 14 U/L (ref 0–44)
ALT: 14 U/L (ref 0–44)
AST: 13 U/L — ABNORMAL LOW (ref 15–41)
AST: 13 U/L — ABNORMAL LOW (ref 15–41)
Albumin: 2.9 g/dL — ABNORMAL LOW (ref 3.5–5.0)
Albumin: 3 g/dL — ABNORMAL LOW (ref 3.5–5.0)
Alkaline Phosphatase: 40 U/L (ref 38–126)
Alkaline Phosphatase: 44 U/L (ref 38–126)
Anion gap: 8 (ref 5–15)
Anion gap: 8 (ref 5–15)
BUN: 25 mg/dL — ABNORMAL HIGH (ref 8–23)
BUN: 24 mg/dL — ABNORMAL HIGH (ref 8–23)
CO2: 20 mmol/L — ABNORMAL LOW (ref 22–32)
CO2: 18 mmol/L — ABNORMAL LOW (ref 22–32)
Calcium: 8.8 mg/dL — ABNORMAL LOW (ref 8.9–10.3)
Calcium: 8.6 mg/dL — ABNORMAL LOW (ref 8.9–10.3)
Chloride: 112 mmol/L — ABNORMAL HIGH (ref 98–111)
Chloride: 112 mmol/L — ABNORMAL HIGH (ref 98–111)
Creatinine, Ser: 1.38 mg/dL — ABNORMAL HIGH (ref 0.61–1.24)
Creatinine, Ser: 1.33 mg/dL — ABNORMAL HIGH (ref 0.61–1.24)
GFR, Estimated: 53 mL/min — ABNORMAL LOW (ref >60)
GFR, Estimated: 56 mL/min — ABNORMAL LOW (ref >60)
Glucose, Bld: 79 mg/dL (ref 70–99)
Glucose, Bld: 81 mg/dL (ref 70–99)
Potassium: 4.3 mmol/L (ref 3.5–5.1)
Potassium: 4 mmol/L (ref 3.5–5.1)
Sodium: 140 mmol/L (ref 135–145)
Sodium: 138 mmol/L (ref 135–145)
Total Bilirubin: 0.6 mg/dL (ref <1.2)
Total Bilirubin: 0.6 mg/dL (ref <1.2)
Total Protein: 5.2 g/dL — ABNORMAL LOW (ref 6.5–8.1)
Total Protein: 5.5 g/dL — ABNORMAL LOW (ref 6.5–8.1)

## 2023-04-28 LAB — CBC
HCT: 25.4 % — ABNORMAL LOW (ref 39.0–52.0)
Hemoglobin: 8.1 g/dL — ABNORMAL LOW (ref 13.0–17.0)
MCH: 25.5 pg — ABNORMAL LOW (ref 26.0–34.0)
MCHC: 31.9 g/dL (ref 30.0–36.0)
MCV: 79.9 fL — ABNORMAL LOW (ref 80.0–100.0)
Platelets: 293 10*3/uL (ref 150–400)
RBC: 3.18 MIL/uL — ABNORMAL LOW (ref 4.22–5.81)
RDW: 17.3 % — ABNORMAL HIGH (ref 11.5–15.5)
WBC: 6.9 10*3/uL (ref 4.0–10.5)
nRBC: 0 % (ref 0.0–0.2)

## 2023-04-28 LAB — HEMOGLOBIN AND HEMATOCRIT, BLOOD
HCT: 28 % — ABNORMAL LOW (ref 39.0–52.0)
HCT: 28.3 % — ABNORMAL LOW (ref 39.0–52.0)
Hemoglobin: 8.7 g/dL — ABNORMAL LOW (ref 13.0–17.0)
Hemoglobin: 8.8 g/dL — ABNORMAL LOW (ref 13.0–17.0)

## 2023-04-28 LAB — MAGNESIUM: Magnesium: 2.4 mg/dL (ref 1.7–2.4)

## 2023-04-28 MED ORDER — BUPROPION HCL ER (XL) 150 MG PO TB24
300.0000 mg | ORAL_TABLET | Freq: Every day | ORAL | Status: DC
  Administered 2023-04-28: 300 mg via ORAL
  Filled 2023-04-28: qty 2

## 2023-04-28 MED ORDER — UMECLIDINIUM-VILANTEROL 62.5-25 MCG/ACT IN AEPB: 1.0000 | INHALATION_SPRAY | Freq: Every day | RESPIRATORY_TRACT | Status: DC

## 2023-04-28 MED ORDER — PANTOPRAZOLE SODIUM 40 MG IV SOLR
40.0000 mg | Freq: Once | INTRAVENOUS | Status: AC
  Administered 2023-04-28: 40 mg via INTRAVENOUS
  Filled 2023-04-28: qty 10

## 2023-04-28 MED ORDER — LORAZEPAM 1 MG PO TABS: 2.0000 mg | ORAL_TABLET | Freq: Two times a day (BID) | ORAL | Status: DC | PRN

## 2023-04-28 MED ORDER — ESCITALOPRAM OXALATE 10 MG PO TABS
20.0000 mg | ORAL_TABLET | Freq: Every day | ORAL | Status: DC
  Administered 2023-04-28: 20 mg via ORAL
  Filled 2023-04-28: qty 2

## 2023-04-28 MED ORDER — ACETAMINOPHEN 325 MG PO TABS: 650.0000 mg | ORAL_TABLET | Freq: Four times a day (QID) | ORAL | Status: DC | PRN

## 2023-04-28 MED ORDER — ONDANSETRON HCL 4 MG PO TABS: 4.0000 mg | ORAL_TABLET | Freq: Four times a day (QID) | ORAL | Status: DC | PRN

## 2023-04-28 MED ORDER — ACETAMINOPHEN 650 MG RE SUPP: 650.0000 mg | Freq: Four times a day (QID) | RECTAL | Status: DC | PRN

## 2023-04-28 MED ORDER — PREDNISONE 5 MG PO TABS
10.0000 mg | ORAL_TABLET | Freq: Every day | ORAL | Status: DC
  Administered 2023-04-28: 10 mg via ORAL
  Filled 2023-04-28: qty 2

## 2023-04-28 MED ORDER — HYDROXYCHLOROQUINE SULFATE 200 MG PO TABS
200.0000 mg | ORAL_TABLET | Freq: Two times a day (BID) | ORAL | Status: DC
  Administered 2023-04-28: 200 mg via ORAL
  Filled 2023-04-28: qty 1

## 2023-04-28 MED ORDER — ONDANSETRON HCL 4 MG/2ML IJ SOLN
4.0000 mg | Freq: Four times a day (QID) | INTRAMUSCULAR | Status: DC | PRN
Start: 2023-04-28 — End: 2023-04-28

## 2023-04-28 NOTE — ED Provider Notes (Signed)
Please see previous notes for full HPI.  In short, patient is a 77 year old male who recently had a hemicolectomy at [facility] and is awaiting transfer to [facility].  Patient has been here for over 34 hours in the ED.  Will discuss with hospitalist for admission until [facility] bed becomes available.  Patient has been transfused in the ED.  Stable hemoglobin.   7:48 AM Discussed with Dr. Robb Matar with [facility] who agrees to admit patient. He requests consultation to GI and general surgery. Plan is to admit patient until [facility] bed becomes available.   8:08 AM Discussed with Amy, PA-C with [facility] who will see patient.  Discussed with Barnetta Chapel, PA-C with general surgery who notes there is no indication for surgery at this point. General surgery is happy to consult if anything changes with patient's status while awaiting transfer to [facility].        Mannie Stabile, PA-C 04/28/23 2440    Gloris Manchester, MD 04/28/23 1536

## 2023-04-28 NOTE — ED Notes (Signed)
Called report to [employer] at [facility].

## 2023-04-28 NOTE — Discharge Summary (Signed)
Physician Discharge Summary   Patient: Herbert Morrison MRN: [id number] DOB: January 24, 1946  Admit date:     04/26/2023  Discharge date: 04/28/23  Discharge Physician: Bobette Mo   PCP: Arlina Robes, MD   Recommendations at discharge:   Will defer to primary surgical team.  Discharge Diagnoses: Principal Problem:   Rectal bleeding Active Problems:   Essential hypertension   GERD (gastroesophageal reflux disease)   Anxiety   Depression   PTSD (post-traumatic stress disorder)   Rheumatoid arthritis (HCC)   Lupus (systemic lupus erythematosus) (HCC)   CKD (chronic kidney disease) stage 3, GFR 30-59 ml/min (HCC)   Coronary arteriosclerosis   Mixed hyperlipidemia   Symptomatic anemia   ABLA (acute blood loss anemia)   Grade I diastolic dysfunction  Hospital Course: Chief Complaint  Patient presents with   Rectal Bleeding    HPI: Herbert Morrison is a 77 y.o. male with medical history significant of anxiety, depression, osteoarthritis, skin cancer, dyspnea, GERD/hiatal hernia, heart murmur as a child, hypertension, SLE, rheumatoid arthritis, history of GI bleeding October this year requiring blood transfusions, ulcerative colitis who underwent a laparoscopic hemicolectomy on November 25th who is coming to the emergency department with complaints of profuse rectal bleeding.  He has been on apixaban 2.5 mg for DVT prophylaxis.  He has been weak, mildly dyspnea on exertion has had mild postural dizziness.  No abdominal pain, nausea, emesis or constipation.  No flank pain, dysuria, frequency or hematuria. He denied fever, chills, rhinorrhea, sore throat, wheezing or hemoptysis.  No chest pain, diaphoresis, PND, orthopnea or recent pitting edema of the lower extremities.  No polyuria, polydipsia, polyphagia or blurred vision.    Lab work: CBC showed white count 9.6, hemoglobin 9.6 g/dL and platelets 409.  Normal PT, INR and lipase level.  Repeat hemoglobin level was down to 7.3  g/dL.  CMP showed a sodium 139, potassium 4.0, chloride 112 and CO2 18 mmol/L.  Glucose 123, BUN 23, creatinine 1.63 mg/dL, total protein 6.3 and albumin 3.4 g/dL.  BUN, the rest of the electrolytes and the rest of the LFTs were normal.   Imaging: CTA GI bleed with no evidence for active bleeding.  Aortic atherosclerosis.  Mildly dilated mid small bowel loops without transition point, likely ileus.  Status post colectomy.  There is stranding and a small amount of extraluminal air near the anastomosis compatible with recent surgery.  No fluid collection identified.  Right renal cysts with no follow-up imaging recommended.   ED course: Initial vital signs were temperature 97.8 F, pulse 62, respiration 16, BP 129/81 mmHg O2 sat 100% on room air.  The patient was transfused a unit of blood, receive diphenhydramine 25 mg IVP, metoclopramide 10 mg IVP and 500 mL of normal saline bolus.  The patient initially accepted to [facility], but no beds available.  Rectal bleeding has subsided.  On-call general surgery and gastroenterology have being informed.  They are available as needed.  Assessment and Plan: Transferred to the primary surgical team at [facility] for further management.  Consultants:  Procedures performed: None. Disposition: Transferred to [facility]. Diet recommendation:  Clear liquid diet DISCHARGE MEDICATION: Allergies as of 04/28/2023       Reactions   Remicade [infliximab] Anaphylaxis   Erythromycin Nausea Only   Grass Pollen([id number] Vern) Other (See Comments)   Seasonal allergies   Pollen Extract Other (See Comments)   Sulfasalazine Nausea And Vomiting     See H&P for further details on medications.  Discharge Exam:  Filed Weights   04/27/23 0927  Weight: 89.8 kg   Physical Exam Vitals and nursing note reviewed.  Constitutional:      General: He is awake. He is not in acute distress.    Appearance: Normal appearance. He is obese. He is ill-appearing.  HENT:     Head: Normocephalic.      Nose: No rhinorrhea.  Eyes:     General: No scleral icterus.    Pupils: Pupils are equal, round, and reactive to light.  Neck:     Vascular: No JVD.  Cardiovascular:     Rate and Rhythm: Regular rhythm. Bradycardia present.     Heart sounds: S1 normal and S2 normal.  Pulmonary:     Effort: Pulmonary effort is normal.     Breath sounds: Normal breath sounds.  Abdominal:     General: Bowel sounds are normal. There is no distension.     Palpations: Abdomen is soft.  Musculoskeletal:     Cervical back: Neck supple.     Right lower leg: No edema.     Left lower leg: No edema.  Skin:    General: Skin is warm and dry.     Coloration: Skin is pale.  Neurological:     General: No focal deficit present.     Mental Status: He is alert and oriented to person, place, and time.  Psychiatric:        Mood and Affect: Mood normal.        Behavior: Behavior normal. Behavior is cooperative.     Condition at discharge: stable  The results of significant diagnostics from this hospitalization (including imaging, microbiology, ancillary and laboratory) are listed below for reference.   Imaging Studies: CT ANGIO GI BLEED Result Date: 04/27/2023 CLINICAL DATA:  Rectal bleeding.  Weakness.  Recent colectomy. EXAM: CTA ABDOMEN AND PELVIS WITHOUT AND WITH CONTRAST TECHNIQUE: Multidetector CT imaging of the abdomen and pelvis was performed using the standard protocol during bolus administration of intravenous contrast. Multiplanar reconstructed images and MIPs were obtained and reviewed to evaluate the vascular anatomy. RADIATION DOSE REDUCTION: This exam was performed according to the departmental dose-optimization program which includes automated exposure control, adjustment of the mA and/or kV according to patient size and/or use of iterative reconstruction technique. CONTRAST:  80mL OMNIPAQUE IOHEXOL 350 MG/ML SOLN COMPARISON:  None Available. FINDINGS: VASCULAR Aorta: Normal caliber aorta without  aneurysm, dissection, vasculitis or significant stenosis. There is moderate calcified atherosclerotic disease throughout the aorta. Celiac: Patent without evidence of aneurysm, dissection, vasculitis or significant stenosis. SMA: Patent without evidence of aneurysm, dissection, vasculitis or significant stenosis. Renals: Both renal arteries are patent without evidence of aneurysm, dissection, vasculitis, fibromuscular dysplasia or significant stenosis. Accessory renal arteries are seen bilaterally. IMA: Small caliber, but grossly patent. Inflow: Patent without evidence of aneurysm, dissection, vasculitis or significant stenosis. Proximal Outflow: Bilateral common femoral and visualized portions of the superficial and profunda femoral arteries are patent without evidence of aneurysm, dissection, vasculitis or significant stenosis. Veins: Within normal limits. Review of the MIP images confirms the above findings. NON-VASCULAR Lower chest: No acute abnormality. Hepatobiliary: No focal liver abnormality is seen. No gallstones, gallbladder wall thickening, or biliary dilatation. Pancreas: Unremarkable. No pancreatic ductal dilatation or surrounding inflammatory changes. Spleen: Normal in size without focal abnormality. Adrenals/Urinary Tract: Bladder is decompressed and not well evaluated. There is no hydronephrosis or perinephric fluid. Multiple right renal cysts are present measuring up 2 3.6 cm. Adrenal glands are within normal limits. Stomach/Bowel: Patient is  status post colectomy with ileocolic anastomosis. There is a tiny focus of extraluminal gas near the anastomosis which is likely postoperative. There is no fluid collection identified. There is mild surrounding inflammatory stranding in this region, likely postoperative. There some mildly dilated mid small bowel loops without transition point. The stomach is nondilated. There is a surgical clip in the rectum. No active gastrointestinal bleeding identified.  Lymphatic: No enlarged lymph nodes are seen. Reproductive: Prostate is unremarkable. Other: There some scarring in the lower anterior abdominal wall. There is no ascites or focal abdominal wall hernia. Musculoskeletal: Mild chronic appearing compression deformity is seen along the superior endplate of L1. Left hip arthroplasty is present. IMPRESSION: 1. No evidence for active gastrointestinal bleeding. 2. Aortic atherosclerosis. 3. Mildly dilated mid small bowel loops without transition point, likely ileus. 4. Status post colectomy. There is stranding in the small amount of extraluminal air near the anastomosis compatible with recent surgery. No fluid collection identified. 5. Right renal cysts.  Follow-up imaging no recommended. Aortic Atherosclerosis (ICD10-I70.0). Electronically Signed   By: Darliss Cheney M.D.   On: 04/27/2023 03:21   CT HEAD WO CONTRAST ( ) Result Date: 04/26/2023 CLINICAL DATA:  Delirium EXAM: CT HEAD WITHOUT CONTRAST TECHNIQUE: Contiguous axial images were obtained from the base of the skull through the vertex without intravenous contrast. RADIATION DOSE REDUCTION: This exam was performed according to the departmental dose-optimization program which includes automated exposure control, adjustment of the mA and/or kV according to patient size and/or use of iterative reconstruction technique. COMPARISON:  None Available. FINDINGS: Brain: Normal anatomic configuration. Parenchymal volume loss is commensurate with the patient's age. Mild periventricular white matter changes are present likely reflecting the sequela of small vessel ischemia. No abnormal intra or extra-axial mass lesion or fluid collection. No abnormal mass effect or midline shift. No evidence of acute intracranial hemorrhage or infarct. Ventricular size is normal. Cerebellum unremarkable. Vascular: No asymmetric hyperdense vasculature at the skull base. Skull: Intact Sinuses/Orbits: Air-fluid level noted within the visualized  right maxillary sinus. Remaining paranasal sinuses are clear. Orbits are unremarkable. Other: Mastoid air cells and middle ear cavities are clear. IMPRESSION: 1. No acute intracranial abnormality. No calvarial fracture. 2. Mild senescent change. 3. Air-fluid level within the visualized right maxillary sinus. Correlate clinically for acute sinusitis. Electronically Signed   By: Helyn Numbers M.D.   On: 04/26/2023 23:12    Microbiology: Results for orders placed or performed during the hospital encounter of 08/08/19  SARS CORONAVIRUS 2 (TAT 6-24 HRS) Nasopharyngeal Nasopharyngeal Swab     Status: None   Collection Time: 08/08/19 10:34 AM   Specimen: Nasopharyngeal Swab  Result Value Ref Range Status   SARS Coronavirus 2 NEGATIVE NEGATIVE Final    Comment: (NOTE) SARS-CoV-2 target nucleic acids are NOT DETECTED. The SARS-CoV-2 RNA is generally detectable in upper and lower respiratory specimens during the acute phase of infection. Negative results do not preclude SARS-CoV-2 infection, do not rule out co-infections with other pathogens, and should not be used as the sole basis for treatment or other patient management decisions. Negative results must be combined with clinical observations, patient history, and epidemiological information. The expected result is Negative. Fact Sheet for Patients: HairSlick.no Fact Sheet for Healthcare Providers: quierodirigir.com This test is not yet approved or cleared by the [place] FDA and  has been authorized for detection and/or diagnosis of SARS-CoV-2 by FDA under an Emergency Use Authorization (EUA). This EUA will remain  in effect (meaning this test can be used) for  the duration of the COVID-19 declaration under Section 56 4(b)(1) of the Act, 21 U.S.C. section 360bbb-3(b)(1), unless the authorization is terminated or revoked sooner. Performed at [facility], [address],  [place], [place] [address]     Labs: CBC: Recent Labs  Lab 04/26/23 2205 04/27/23 0020 04/27/23 0926 04/28/23 0040 04/28/23 0903  WBC 9.7 11.0*  --   --  6.9  NEUTROABS 6.4  --   --   --   --   HGB 9.6* 7.3* 8.7* 8.7* 8.1*  HCT 32.8* 24.7* 28.2* 28.3* 25.4*  MCV 81.4 81.3  --   --  79.9*  PLT 492* 372  --   --  293   Basic Metabolic Panel: Recent Labs  Lab 04/26/23 2205 04/28/23 0903 04/28/23 1131  NA 139 140 138  K 4.0 4.3 4.0  CL 112* 112* 112*  CO2 18* 20* 18*  GLUCOSE 123* 79 81  BUN 23 25* 24*  CREATININE 1.63* 1.38* 1.33*  CALCIUM 9.5 8.8* 8.6*  MG  --  2.4  --    Liver Function Tests: Recent Labs  Lab 04/26/23 2205 04/28/23 0903 04/28/23 1131  AST 19 13* 13*  ALT 15 14 14   ALKPHOS 50 40 44  BILITOT 0.3 0.6 0.6  PROT 6.3* 5.2* 5.5*  ALBUMIN 3.4* 2.9* 3.0*   CBG: No results for input(s): "GLUCAP" in the last 168 hours.  Discharge time spent: less than 30 minutes.  Signed: Bobette Mo, MD [facility] 04/28/2023  This document was prepared using Dragon voice recognition software and may contain some unintended transcription errors.

## 2023-04-28 NOTE — ED Notes (Signed)
Called [facility] to transport patient to [facility] Rm [phone]

## 2023-04-28 NOTE — H&P (Signed)
History and Physical    Patient: Herbert Morrison:[id number] DOB: 11-Oct-1945 DOA: 04/26/2023 DOS: the patient was seen and examined on 04/28/2023 PCP: Arlina Robes, MD  Patient coming from: Home  Chief Complaint:  Chief Complaint  Patient presents with   Rectal Bleeding   HPI: Herbert Morrison is a 77 y.o. male with medical history significant of anxiety, depression, osteoarthritis, skin cancer, dyspnea, GERD/hiatal hernia, heart murmur as a child, hypertension, SLE, rheumatoid arthritis, history of GI bleeding October this year requiring blood transfusions, ulcerative colitis who underwent a laparoscopic hemicolectomy on November 25th who is coming to the emergency department with complaints of profuse rectal bleeding.  He has been on apixaban 2.5 mg for DVT prophylaxis.  He has been weak, mildly dyspnea on exertion has had mild postural dizziness.  No abdominal pain, nausea, emesis or constipation.  No flank pain, dysuria, frequency or hematuria. He denied fever, chills, rhinorrhea, sore throat, wheezing or hemoptysis.  No chest pain, diaphoresis, PND, orthopnea or recent pitting edema of the lower extremities.  No polyuria, polydipsia, polyphagia or blurred vision.   Lab work: CBC showed white count 9.6, hemoglobin 9.6 g/dL and platelets 259.  Normal PT, INR and lipase level.  Repeat hemoglobin level was down to 7.3 g/dL.  CMP showed a sodium 139, potassium 4.0, chloride 112 and CO2 18 mmol/L.  Glucose 123, BUN 23, creatinine 1.63 mg/dL, total protein 6.3 and albumin 3.4 g/dL.  BUN, the rest of the electrolytes and the rest of the LFTs were normal.  Imaging: CTA GI bleed with no evidence for active bleeding.  Aortic atherosclerosis.  Mildly dilated mid small bowel loops without transition point, likely ileus.  Status post colectomy.  There is stranding and a small amount of extraluminal air near the anastomosis compatible with recent surgery.  No fluid collection identified.  Right  renal cysts with no follow-up imaging recommended.   ED course: Initial vital signs were temperature 97.8 F, pulse 62, respiration 16, BP 129/81 mmHg O2 sat 100% on room air.  The patient was transfused a unit of blood, receive diphenhydramine 25 mg IVP, metoclopramide 10 mg IVP and 500 mL of normal saline bolus.  The patient initially accepted to [facility], but no beds available.  Rectal bleeding has subsided.  On-call general surgery and gastroenterology have being informed.  They are available as needed.  Review of Systems: As mentioned in the history of present illness. All other systems reviewed and are negative. 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   Depression    Dyspnea    GERD (gastroesophageal reflux disease)    Heart murmur    as a child   Hiatal hernia    History of hiatal hernia    Hypertension    Lupus 12/31/2020   Rheumatoid arthritis (HCC) 12/31/2020   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[facility])    Past Surgical History:  Procedure Laterality Date   BIOPSY  01/30/2021   Procedure: BIOPSY;  Surgeon: Malissa Hippo, MD;  [facility];  Service: Endoscopy;;   CATARACT EXTRACTION Left 2023   COLONOSCOPY N/A 04/09/2016   Procedure: COLONOSCOPY;  Surgeon: Malissa Hippo, MD;  [facility];  Service: Endoscopy;  Laterality: N/A;  1:45   COLONOSCOPY WITH PROPOFOL N/A 01/30/2021   Procedure: COLONOSCOPY WITH PROPOFOL;  Surgeon: Malissa Hippo, MD;  [facility];  Service: Endoscopy;  Laterality: N/A;   COLONOSCOPY WITH PROPOFOL N/A 02/17/2023   Procedure: COLONOSCOPY WITH PROPOFOL;  Surgeon: Jenel Lucks, MD;  Location: [facility];  Service: Gastroenterology;  Laterality: N/A;   ELBOW BURSA SURGERY Left 1983   ESOPHAGOGASTRODUODENOSCOPY (EGD) WITH PROPOFOL N/A 01/30/2021   Procedure: ESOPHAGOGASTRODUODENOSCOPY (EGD) WITH PROPOFOL;  Surgeon: Malissa Hippo,  MD;  [facility];  Service: Endoscopy;  Laterality: N/A;  2:00, pt knows to arrive at 11:00   HEMOSTASIS CLIP PLACEMENT  02/17/2023   Procedure: HEMOSTASIS CLIP PLACEMENT;  Surgeon: Jenel Lucks, MD;  Location: [facility];  Service: Gastroenterology;;   KNEE ARTHROSCOPY Right 1979   left elbow     spur removed   left knee surgery     open surgery for a meniscus tear 1970's   lungs  07/2022   POLYPECTOMY  04/09/2016   Procedure: POLYPECTOMY;  Surgeon: Malissa Hippo, MD;  [facility];  Service: Endoscopy;;  cecal polyp   POLYPECTOMY  01/30/2021   Procedure: POLYPECTOMY;  Surgeon: Malissa Hippo, MD;  [facility];  Service: Endoscopy;;   POLYPECTOMY N/A 02/17/2023   Procedure: POLYPECTOMY;  Surgeon: Jenel Lucks, MD;  Location: [facility];  Service: Gastroenterology;  Laterality: N/A;   SUBMUCOSAL LIFTING INJECTION  02/17/2023   Procedure: SUBMUCOSAL LIFTING INJECTION;  Surgeon: Jenel Lucks, MD;  Location: [facility];  Service: Gastroenterology;;   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   Procedure: LEFT TOTAL HIP ARTHROPLASTY ANTERIOR APPROACH;  Surgeon: Durene Romans, MD;  Location: [facility];  Service: Orthopedics;  Laterality: Left;  70 mins   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   Procedure: TOTAL KNEE ARTHROPLASTY;  Surgeon: Durene Romans, MD;  Location: [facility];  Service: Orthopedics;  Laterality: Left;  70 mins   UMBILICAL HERNIA REPAIR N/A 05/09/2022   Procedure: PRIMARY UMBILICAL HERNIA REPAIR;  Surgeon: Karie Soda, MD;  Location: [facility];  Service: General;  Laterality: N/A;   XI ROBOTIC ASSISTED PARAESOPHAGEAL HERNIA REPAIR N/A 05/09/2022   Procedure: ROBOTIC REPAIR OF PARAESOPHAGEAL HIATAL HERNIA WITH FUNDOPLICATION;  Surgeon: Karie Soda, MD;  Location: [facility];  Service: General;  Laterality: N/A;  GEN w/ERAS LOCAL   Social History:  reports that he quit smoking about 49 years ago. His smoking use included  cigarettes. He started smoking about 54 years ago. He has a 5 pack-year smoking history. He quit smokeless tobacco use about 58 years ago.  His smokeless tobacco use included chew. He reports that he does not currently use alcohol. He reports that he does not use drugs.  Allergies  Allergen Reactions   Remicade [Infliximab] Anaphylaxis   Erythromycin Nausea Only   Grass Pollen(K-O-R-T-Swt Vern) Other (See Comments)    Seasonal allergies    Pollen Extract Other (See Comments)   Sulfasalazine Nausea And Vomiting    Family History  Problem Relation Age of Onset   Leukemia Father    Bladder Cancer Father    Stomach cancer Neg Hx    Esophageal cancer Neg Hx    Colon cancer Neg Hx    Pancreatic cancer Neg Hx    Colon polyps Neg Hx    Rectal cancer Neg Hx     Prior to Admission medications   Medication Sig Start Date End Date Taking? Authorizing Provider  amLODipine-valsartan (EXFORGE) 10-320 MG tablet Take 1 tablet by mouth in the morning.   Yes [provider]  Ernestina Patches 62.5-25 MCG/ACT AEPB Inhale 1  puff into the lungs daily. 10/13/22  Yes [provider]  apixaban (ELIQUIS) 5 MG TABS tablet Take 5 mg by mouth 2 (two) times daily. Following recent surgery   Yes [provider]  buPROPion (WELLBUTRIN XL) 300 MG 24 hr tablet Take 300 mg by mouth in the morning.   Yes [provider]  cyclobenzaprine (FLEXERIL) 10 MG tablet Take 10 mg by mouth 3 (three) times daily as needed for muscle spasms. 08/24/20  Yes [provider]  escitalopram (LEXAPRO) 20 MG tablet Take 20 mg by mouth in the morning.   Yes [provider]  fluticasone (FLONASE) 50 MCG/ACT nasal spray Place 1 spray into both nostrils daily. Patient taking differently: Place 1 spray into both nostrils daily as needed for allergies or rhinitis.   Yes [provider]  hydrALAZINE (APRESOLINE) 50 MG tablet Take 50 mg by mouth 3 (three) times daily.   Yes [provider]  hydroxychloroquine (PLAQUENIL) 200 MG tablet Take 200 mg by mouth 2 (two) times daily.   Yes [provider]  ketoconazole (NIZORAL) 2 % shampoo Apply 1 Application topically every other day. Every other shower   Yes [provider]  LORazepam (ATIVAN) 2 MG tablet Take 2 mg by mouth in the morning and at bedtime.   Yes [provider]  meclizine (ANTIVERT) 25 MG tablet Take 25 mg by mouth 3 (three) times daily as needed for dizziness or nausea.   Yes [provider]  ondansetron (ZOFRAN) 4 MG tablet Take 4 mg by mouth every 6 (six) hours as needed.   Yes [provider]  predniSONE (DELTASONE) 5 MG tablet Take 10 mg by mouth in the morning. 01/01/21  Yes [provider]  traMADol (ULTRAM) 50 MG tablet Take 1-2 tablets (50-100 mg total) by mouth every 6 (six) hours as needed for moderate pain or severe pain. 05/09/22  Yes Karie Soda, MD  amoxicillin (AMOXIL) 500 MG capsule Take 1,000 mg by mouth 2 (two) times daily. Patient not taking: Reported on 04/27/2023 02/19/23   [provider]  famotidine (PEPCID) 20 MG tablet     [provider]  metoCLOPramide (REGLAN) 5 MG tablet     [provider]  metoprolol succinate (TOPROL-XL) 50 MG 24 hr tablet Take 50 mg by mouth in the morning. Take with or immediately following a meal. Patient not taking: Reported on 04/27/2023    [provider]  omeprazole (PRILOSEC) 20 MG capsule Take 20 mg by mouth in the morning. Patient not taking: Reported on 10/23/2022    [provider]  oxyCODONE-acetaminophen (PERCOCET/ROXICET) 5-325 MG tablet Take 2 tablets by mouth every 8 (eight) hours as needed for severe pain (pain score 7-10). Patient not taking: Reported on 04/27/2023    [provider]  polyethylene glycol (MIRALAX / GLYCOLAX) 17 g packet Take by mouth. Patient not taking: Reported on 10/23/2022    [provider]  tamsulosin  (FLOMAX) 0.4 MG CAPS capsule Take 0.4 mg by mouth daily. Patient not taking: Reported on 04/27/2023    [provider]  zinc gluconate 50 MG tablet Take 50 mg by mouth in the morning. Patient not taking: Reported on 10/23/2022    [provider]    Physical Exam: Vitals:   04/28/23 0247 04/28/23 0400 04/28/23 0613 04/28/23 0714  BP:  (!) 161/70  135/60  Pulse:  (!) 58  (!) 54  Resp:  13  13  Temp: 97.8 F (36.6 C)  98 F (36.7 C) 97.8 F (36.6 C)  TempSrc: Oral  Oral Oral  SpO2:  98%  98%  Weight:      Height:       Physical Exam Vitals and nursing note reviewed.  Constitutional:      General: He is awake. He is not in acute distress.    Appearance: Normal appearance. He is obese. He is ill-appearing.  HENT:     Head: Normocephalic.     Nose: No rhinorrhea.  Eyes:     General: No scleral icterus.    Pupils: Pupils are equal, round, and reactive to light.  Neck:     Vascular: No JVD.  Cardiovascular:     Rate and Rhythm: Regular rhythm. Bradycardia present.     Heart sounds: S1 normal and S2 normal.  Pulmonary:     Effort: Pulmonary effort is normal.     Breath sounds: Normal breath sounds.  Abdominal:     General: Bowel sounds are normal. There is no distension.     Palpations: Abdomen is soft.  Musculoskeletal:     Cervical back: Neck supple.     Right lower leg: No edema.     Left lower leg: No edema.  Skin:    General: Skin is warm and dry.     Coloration: Skin is pale.  Neurological:     General: No focal deficit present.     Mental Status: He is alert and oriented to person, place, and time.  Psychiatric:        Mood and Affect: Mood normal.        Behavior: Behavior normal. Behavior is cooperative.     Data Reviewed:  Results are pending, will review when available.  08/31/2021 echocardiogram   EKG: Vent. rate 89 BPM PR interval 217 ms QRS duration 117 ms QT/QTcB 385/469 ms P-R-T axes -22 -70 50 Sinus rhythm Borderline  prolonged PR interval Incomplete left bundle branch block Probable left ventricular hypertrophy?n/A,;s  Assessment and Plan: Principal Problem:   Symptomatic anemia   ABLA (acute blood loss anemia) In the setting of:   Rectal bleeding Status post laparoscopic hemicolectomy Admit to inpatient/PCU. Discontinue apixaban. -Low-dose prescribed for DVT prophylaxis. Monitor hematocrit and hemoglobin. Transfuse further as needed. Consult GI or general surgery as needed. Has subsided, but H&H dropped another 0.6 g earlier. If no further bleeding, may discharge in a.m. with OP f/up with his surgeon.  Active Problems:   Essential hypertension Hold antihypertensives for now.    GERD (gastroesophageal reflux disease) Currently on parenteral PPI.    Anxiety   Depression   PTSD (post-traumatic stress disorder) Continue escitalopram 20 mg p.o. daily.   Continue bupropion 300 mg p.o. daily. Continue lorazepam 2 mg p.o. twice daily as needed. OP follow-up with PCP and/or behavioral health.    Rheumatoid arthritis (HCC)   Lupus (systemic lupus erythematosus) (HCC) Continue hydroxychloroquine 200 mg twice daily. Continue physiological prednisone. Follow-up with rheumatology as an outpatient.    CKD (chronic kidney disease) stage 3, GFR 30-59 ml/min (HCC) Monitor renal function and electrolytes.    Coronary arteriosclerosis Holding anticoagulation. Holding hydralazine/metoprolol in the setting of blood loss. Follow-up with cardiology as an outpatient.    Grade I diastolic dysfunction No signs of decompensation. Holding beta-blocker and hydralazine.    Mixed hyperlipidemia Currently not on medical therapy. Continue lifestyle modifications. Follow-up with PCP.     Advance Care Planning:   Code Status: Full Code   Consults:   Family Communication:  His wife was at bedside.  Severity of Illness: The appropriate patient status for this patient is INPATIENT. Inpatient status is  judged to be reasonable and necessary in order to provide the required intensity of service to ensure the patient's safety. The patient's presenting symptoms, physical exam findings, and initial radiographic and laboratory data in the context of their chronic comorbidities is felt to place them at high risk for further clinical deterioration. Furthermore, it is not anticipated that the patient will be medically stable for discharge from the hospital within 2 midnights of admission.   * I certify that at the point of admission it is my clinical judgment that the patient will require inpatient hospital care spanning beyond 2 midnights from the point of admission due to high intensity of service, high risk for further deterioration and high frequency of surveillance required.*  Author: Bobette Mo, MD 04/28/2023 10:00 AM  For on call review [email or web address].   This document was prepared using Dragon voice recognition software and may contain some unintended transcription errors.

## 2023-04-28 NOTE — ED Notes (Signed)
Called [phone] and asked about the room number that pt is supposed to be going to. I was told that it is Rm [id number].

## 2023-04-28 NOTE — ED Notes (Signed)
Leann with placement at [facility] called to state pt is still on list for transfer, continue to have no beds available. They have him documented as a priority. She will keep [place] updated regarding bed situation.

## 2023-04-30 LAB — BPAM RBC
Blood Product Expiration Date: 202501012359
Blood Product Expiration Date: 202501012359
Blood Product Expiration Date: 202501012359
ISSUE DATE / TIME: 202412160340
Unit Type and Rh: 6200
Unit Type and Rh: 6200
Unit Type and Rh: 6200

## 2023-04-30 LAB — TYPE AND SCREEN
ABO/RH(D): A POS
Antibody Screen: NEGATIVE
Unit division: 0
Unit division: 0
Unit division: 0

## 2023-05-15 ENCOUNTER — Emergency Department (HOSPITAL_COMMUNITY): Payer: Medicare Other

## 2023-05-15 ENCOUNTER — Inpatient Hospital Stay (HOSPITAL_COMMUNITY)
Admission: EM | Admit: 2023-05-15 | Discharge: 2023-05-18 | DRG: 682 | Disposition: A | Discharge status: Home / Self Care | Payer: Medicare Other | Attending: Family Medicine | Admitting: Family Medicine

## 2023-05-15 ENCOUNTER — Encounter (HOSPITAL_COMMUNITY): Payer: Self-pay

## 2023-05-15 ENCOUNTER — Telehealth: Payer: Self-pay | Admitting: Gastroenterology

## 2023-05-15 ENCOUNTER — Other Ambulatory Visit: Payer: Self-pay

## 2023-05-15 DIAGNOSIS — I959 Hypotension, unspecified: Secondary | ICD-10-CM | POA: Diagnosis present

## 2023-05-15 DIAGNOSIS — G9341 Metabolic encephalopathy: Secondary | ICD-10-CM | POA: Diagnosis present

## 2023-05-15 DIAGNOSIS — K5190 Ulcerative colitis, unspecified, without complications: Secondary | ICD-10-CM | POA: Diagnosis present

## 2023-05-15 DIAGNOSIS — K219 Gastro-esophageal reflux disease without esophagitis: Secondary | ICD-10-CM | POA: Diagnosis present

## 2023-05-15 DIAGNOSIS — Z79899 Other long term (current) drug therapy: Secondary | ICD-10-CM

## 2023-05-15 DIAGNOSIS — Z7901 Long term (current) use of anticoagulants: Secondary | ICD-10-CM | POA: Diagnosis not present

## 2023-05-15 DIAGNOSIS — E66811 Obesity, class 1: Secondary | ICD-10-CM | POA: Diagnosis present

## 2023-05-15 DIAGNOSIS — Z9109 Other allergy status, other than to drugs and biological substances: Secondary | ICD-10-CM

## 2023-05-15 DIAGNOSIS — Z881 Allergy status to other antibiotic agents status: Secondary | ICD-10-CM | POA: Diagnosis not present

## 2023-05-15 DIAGNOSIS — F419 Anxiety disorder, unspecified: Secondary | ICD-10-CM | POA: Diagnosis present

## 2023-05-15 DIAGNOSIS — Z85828 Personal history of other malignant neoplasm of skin: Secondary | ICD-10-CM

## 2023-05-15 DIAGNOSIS — Z882 Allergy status to sulfonamides status: Secondary | ICD-10-CM

## 2023-05-15 DIAGNOSIS — D649 Anemia, unspecified: Secondary | ICD-10-CM | POA: Diagnosis present

## 2023-05-15 DIAGNOSIS — Z96652 Presence of left artificial knee joint: Secondary | ICD-10-CM | POA: Diagnosis present

## 2023-05-15 DIAGNOSIS — E875 Hyperkalemia: Secondary | ICD-10-CM | POA: Diagnosis present

## 2023-05-15 DIAGNOSIS — Z888 Allergy status to other drugs, medicaments and biological substances status: Secondary | ICD-10-CM

## 2023-05-15 DIAGNOSIS — N179 Acute kidney failure, unspecified: Principal | ICD-10-CM

## 2023-05-15 DIAGNOSIS — Z87892 Personal history of anaphylaxis: Secondary | ICD-10-CM

## 2023-05-15 DIAGNOSIS — M069 Rheumatoid arthritis, unspecified: Secondary | ICD-10-CM | POA: Diagnosis present

## 2023-05-15 DIAGNOSIS — Z9049 Acquired absence of other specified parts of digestive tract: Secondary | ICD-10-CM

## 2023-05-15 DIAGNOSIS — E872 Acidosis, unspecified: Secondary | ICD-10-CM | POA: Diagnosis present

## 2023-05-15 DIAGNOSIS — Z860100 Personal history of colon polyps, unspecified: Secondary | ICD-10-CM

## 2023-05-15 DIAGNOSIS — R197 Diarrhea, unspecified: Secondary | ICD-10-CM

## 2023-05-15 DIAGNOSIS — I10 Essential (primary) hypertension: Secondary | ICD-10-CM | POA: Diagnosis present

## 2023-05-15 DIAGNOSIS — F32A Depression, unspecified: Secondary | ICD-10-CM | POA: Diagnosis present

## 2023-05-15 DIAGNOSIS — M329 Systemic lupus erythematosus, unspecified: Secondary | ICD-10-CM | POA: Diagnosis present

## 2023-05-15 DIAGNOSIS — E861 Hypovolemia: Secondary | ICD-10-CM | POA: Diagnosis present

## 2023-05-15 DIAGNOSIS — Z6830 Body mass index (BMI) 30.0-30.9, adult: Secondary | ICD-10-CM | POA: Diagnosis not present

## 2023-05-15 DIAGNOSIS — E878 Other disorders of electrolyte and fluid balance, not elsewhere classified: Secondary | ICD-10-CM | POA: Diagnosis present

## 2023-05-15 DIAGNOSIS — Z96642 Presence of left artificial hip joint: Secondary | ICD-10-CM | POA: Diagnosis present

## 2023-05-15 DIAGNOSIS — Z87891 Personal history of nicotine dependence: Secondary | ICD-10-CM

## 2023-05-15 LAB — CBC WITH DIFFERENTIAL/PLATELET
Abs Immature Granulocytes: 0.02 10*3/uL (ref 0.00–0.07)
Basophils Absolute: 0 10*3/uL (ref 0.0–0.1)
Basophils Relative: 0 %
Eosinophils Absolute: 0.4 10*3/uL (ref 0.0–0.5)
Eosinophils Relative: 5 %
HCT: 32.6 % — ABNORMAL LOW (ref 39.0–52.0)
Hemoglobin: 9.8 g/dL — ABNORMAL LOW (ref 13.0–17.0)
Immature Granulocytes: 0 %
Lymphocytes Relative: 12 %
Lymphs Abs: 1 10*3/uL (ref 0.7–4.0)
MCH: 24.3 pg — ABNORMAL LOW (ref 26.0–34.0)
MCHC: 30.1 g/dL (ref 30.0–36.0)
MCV: 80.9 fL (ref 80.0–100.0)
Monocytes Absolute: 1.3 10*3/uL — ABNORMAL HIGH (ref 0.1–1.0)
Monocytes Relative: 15 %
Neutro Abs: 5.7 10*3/uL (ref 1.7–7.7)
Neutrophils Relative %: 68 %
Platelets: 477 10*3/uL — ABNORMAL HIGH (ref 150–400)
RBC: 4.03 MIL/uL — ABNORMAL LOW (ref 4.22–5.81)
RDW: 18.3 % — ABNORMAL HIGH (ref 11.5–15.5)
WBC: 8.3 10*3/uL (ref 4.0–10.5)
nRBC: 0.4 % — ABNORMAL HIGH (ref 0.0–0.2)

## 2023-05-15 LAB — URINALYSIS, W/ REFLEX TO CULTURE (INFECTION SUSPECTED)
Bacteria, UA: NONE SEEN
Bilirubin Urine: NEGATIVE
Glucose, UA: NEGATIVE mg/dL
Hgb urine dipstick: NEGATIVE
Ketones, ur: NEGATIVE mg/dL
Leukocytes,Ua: NEGATIVE
Nitrite: NEGATIVE
Protein, ur: NEGATIVE mg/dL
Specific Gravity, Urine: 1.015 (ref 1.005–1.030)
pH: 5 (ref 5.0–8.0)

## 2023-05-15 LAB — COMPREHENSIVE METABOLIC PANEL
ALT: 21 U/L (ref 0–44)
AST: 20 U/L (ref 15–41)
Albumin: 3.6 g/dL (ref 3.5–5.0)
Alkaline Phosphatase: 63 U/L (ref 38–126)
Anion gap: 7 (ref 5–15)
BUN: 78 mg/dL — ABNORMAL HIGH (ref 8–23)
CO2: 12 mmol/L — ABNORMAL LOW (ref 22–32)
Calcium: 9.6 mg/dL (ref 8.9–10.3)
Chloride: 119 mmol/L — ABNORMAL HIGH (ref 98–111)
Creatinine, Ser: 3.9 mg/dL — ABNORMAL HIGH (ref 0.61–1.24)
GFR, Estimated: 15 mL/min — ABNORMAL LOW (ref >60)
Glucose, Bld: 90 mg/dL (ref 70–99)
Potassium: 5.3 mmol/L — ABNORMAL HIGH (ref 3.5–5.1)
Sodium: 138 mmol/L (ref 135–145)
Total Bilirubin: 0.5 mg/dL (ref 0.0–1.2)
Total Protein: 6.7 g/dL (ref 6.5–8.1)

## 2023-05-15 LAB — CBC
HCT: 31.7 % — ABNORMAL LOW (ref 39.0–52.0)
Hemoglobin: 9 g/dL — ABNORMAL LOW (ref 13.0–17.0)
MCH: 23.4 pg — ABNORMAL LOW (ref 26.0–34.0)
MCHC: 28.4 g/dL — ABNORMAL LOW (ref 30.0–36.0)
MCV: 82.6 fL (ref 80.0–100.0)
Platelets: 410 10*3/uL — ABNORMAL HIGH (ref 150–400)
RBC: 3.84 MIL/uL — ABNORMAL LOW (ref 4.22–5.81)
RDW: 18.5 % — ABNORMAL HIGH (ref 11.5–15.5)
WBC: 7.4 10*3/uL (ref 4.0–10.5)
nRBC: 0 % (ref 0.0–0.2)

## 2023-05-15 LAB — RESP PANEL BY RT-PCR (RSV, FLU A&B, COVID)  RVPGX2
Influenza A by PCR: NEGATIVE
Influenza B by PCR: NEGATIVE
Resp Syncytial Virus by PCR: NEGATIVE
SARS Coronavirus 2 by RT PCR: NEGATIVE

## 2023-05-15 LAB — I-STAT CG4 LACTIC ACID, ED: Lactic Acid, Venous: 0.6 mmol/L (ref 0.5–1.9)

## 2023-05-15 LAB — TROPONIN I (HIGH SENSITIVITY): Troponin I (High Sensitivity): 4 ng/L (ref <18)

## 2023-05-15 MED ORDER — SODIUM BICARBONATE 650 MG PO TABS
650.0000 mg | ORAL_TABLET | Freq: Four times a day (QID) | ORAL | Status: AC
  Administered 2023-05-16 (×3): 650 mg via ORAL
  Filled 2023-05-15 (×4): qty 1

## 2023-05-15 MED ORDER — ACETAMINOPHEN 650 MG RE SUPP: 650.0000 mg | Freq: Four times a day (QID) | RECTAL | Status: DC | PRN

## 2023-05-15 MED ORDER — POLYETHYLENE GLYCOL 3350 17 G PO PACK: 17.0000 g | PACK | Freq: Every day | ORAL | Status: DC | PRN

## 2023-05-15 MED ORDER — LORAZEPAM 1 MG PO TABS
1.0000 mg | ORAL_TABLET | Freq: Two times a day (BID) | ORAL | Status: DC | PRN
  Administered 2023-05-16 – 2023-05-17 (×2): 1 mg via ORAL
  Filled 2023-05-15 (×3): qty 1

## 2023-05-15 MED ORDER — PREDNISONE 5 MG PO TABS
10.0000 mg | ORAL_TABLET | Freq: Every day | ORAL | Status: DC
  Administered 2023-05-16 – 2023-05-18 (×3): 10 mg via ORAL
  Filled 2023-05-15 (×3): qty 2

## 2023-05-15 MED ORDER — LACTATED RINGERS IV SOLN: INTRAVENOUS | Status: AC

## 2023-05-15 MED ORDER — SODIUM CHLORIDE 0.9 % IV BOLUS
1000.0000 mL | Freq: Once | INTRAVENOUS | Status: AC
  Administered 2023-05-15: 1000 mL via INTRAVENOUS

## 2023-05-15 MED ORDER — SODIUM CHLORIDE 0.9% FLUSH
3.0000 mL | Freq: Two times a day (BID) | INTRAVENOUS | Status: DC
  Administered 2023-05-16 – 2023-05-18 (×4): 3 mL via INTRAVENOUS

## 2023-05-15 MED ORDER — ACETAMINOPHEN 325 MG PO TABS
650.0000 mg | ORAL_TABLET | Freq: Four times a day (QID) | ORAL | Status: DC | PRN
  Administered 2023-05-16: 650 mg via ORAL
  Filled 2023-05-15: qty 2

## 2023-05-15 MED ORDER — HEPARIN SODIUM (PORCINE) 5000 UNIT/ML IJ SOLN
5000.0000 [IU] | Freq: Three times a day (TID) | INTRAMUSCULAR | Status: DC
  Administered 2023-05-16 – 2023-05-17 (×4): 5000 [IU] via SUBCUTANEOUS
  Filled 2023-05-15 (×5): qty 1

## 2023-05-15 NOTE — Assessment & Plan Note (Signed)
 Per H&P from 04/28/2023 patinet on prednisone ?5 mg . Will c.w. same this evening. No evidence of flair of lupus. Do not suspect need for stress dose steroids.

## 2023-05-15 NOTE — Assessment & Plan Note (Signed)
 Patinet BP was 103/58 on presentation. Likely due to hypovolemia. I will be holding off on any anti HTN agents for this evening.

## 2023-05-15 NOTE — Assessment & Plan Note (Addendum)
 Was unable to be woken up today.  This is felt to be due to hypotension/AKI and metabolic acidosis.  Patient has had an excellent clinical response to IV fluid administration in the ER.  We will continue with same.  At this time acute metabolic encephalopathy has resolved.  Patient exam is completely nonfocal and wife reports that patient mentation is at baseline.  Patinet previously documented to be taking ativan  bid for depresion? I will change it to 1 mg bid prn for anxiety/insomnia. Would favor taper off this medicaiton over the long term as it may be contributing to mentation changes.

## 2023-05-15 NOTE — Assessment & Plan Note (Signed)
 This seems to have started around the last hospitalization of the patient in late December.  Patient not having any fever or white count or abdominal pain.  CAT scan abdomen pelvis nonactionable at this time.  Patient does have some chronic stranding around the site of prior colectomy.  Patient is pending stool for C. difficile and pathogen panel.  At this time I will not start empiric antibiotics as patient is looking nontoxic.

## 2023-05-15 NOTE — ED Notes (Signed)
 Pt assisted to the bedside commode. Wife at bedside to assist. Callbell within reach and he was instructed to call when he was finished.

## 2023-05-15 NOTE — Telephone Encounter (Signed)
Left message for pts wife to call back. 

## 2023-05-15 NOTE — H&P (Addendum)
 History and Physical    Patient: Herbert Morrison FMW:[id number] DOB: 09-21-1945 DOA: 05/15/2023 DOS: the patient was seen and examined on 05/15/2023 PCP: Patient, No Pcp Per  Patient coming from: Home  Chief Complaint:  Chief Complaint  Patient presents with   Fatigue   HPI: Herbert Morrison is a 78 y.o. male with medical history significant of anxiety, depression, osteoarthritis, skin cancer, dyspnea, GERD/hiatal hernia, heart murmur as a child, hypertension, SLE, rheumatoid arthritis, history of GI bleeding October this year requiring blood transfusions, ulcerative colitis who underwent a laparoscopic hemicolectomy on November 25th   Patient last hospitalization was in  12/174/2024 when he presented with GI bleeding and patinet was transferred to [facility] for further management. Unfortunately report from [facility] regarding treatment afforded there is not available at this time.  Since soon after discharge. Patient reports having profuse diarrhea more than 5 episodes a day no blood. No emesis. No abd pain or fever. Patient has become progressively fatigued and this AM was very difficult to arouse by wife (who was initially the primary historian). And brought to ER.  ER finding of AKI and soft BP. Patient treated with iv fluids and is currently fully awake. Wife says that patient lethargy has resolved.  No history of trauma, headache, vision changes, or focal weakness. Medical eval is sought. Patient offers no complaints, except as above.  Review of Systems: As mentioned in the history of present illness. All other systems reviewed and are negative. 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   Depression    Dyspnea    GERD (gastroesophageal reflux disease)    Grade I diastolic dysfunction 04/28/2023   Heart murmur    as a child   Hiatal hernia    History of hiatal hernia    Hypertension    Lupus 12/31/2020    Rheumatoid arthritis (HCC) 12/31/2020   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[facility])    Past Surgical History:  Procedure Laterality Date   BIOPSY  01/30/2021   Procedure: BIOPSY;  Surgeon: Golda Claudis PENNER, MD;  [facility];  Service: Endoscopy;;   CATARACT EXTRACTION Left 2023   COLONOSCOPY N/A 04/09/2016   Procedure: COLONOSCOPY;  Surgeon: Claudis PENNER Golda, MD;  [facility];  Service: Endoscopy;  Laterality: N/A;  1:45   COLONOSCOPY WITH PROPOFOL  N/A 01/30/2021   Procedure: COLONOSCOPY WITH PROPOFOL ;  Surgeon: Golda Claudis PENNER, MD;  [facility];  Service: Endoscopy;  Laterality: N/A;   COLONOSCOPY WITH PROPOFOL  N/A 02/17/2023   Procedure: COLONOSCOPY WITH PROPOFOL ;  Surgeon: Stacia Glendia BRAVO, MD;  Location: [facility];  Service: Gastroenterology;  Laterality: N/A;   ELBOW BURSA SURGERY Left 1983   ESOPHAGOGASTRODUODENOSCOPY (EGD) WITH PROPOFOL  N/A 01/30/2021   Procedure: ESOPHAGOGASTRODUODENOSCOPY (EGD) WITH PROPOFOL ;  Surgeon: Golda Claudis PENNER, MD;  [facility];  Service: Endoscopy;  Laterality: N/A;  2:00, pt knows to arrive at 11:00   HEMOSTASIS CLIP PLACEMENT  02/17/2023   Procedure: HEMOSTASIS CLIP PLACEMENT;  Surgeon: Stacia Glendia BRAVO, MD;  [facility]: [facility];  Service: Gastroenterology;;   KNEE ARTHROSCOPY Right 1979   left elbow     spur removed   left knee surgery     open surgery for a meniscus tear 1970's   lungs  07/2022   POLYPECTOMY  04/09/2016   Procedure: POLYPECTOMY;  Surgeon: Claudis PENNER Golda, MD;  [facility];  Service: Endoscopy;;  cecal polyp   POLYPECTOMY  01/30/2021   Procedure: POLYPECTOMY;  Surgeon: Golda Claudis PENNER, MD;  [facility];  Service: Endoscopy;;   POLYPECTOMY N/A 02/17/2023   Procedure: POLYPECTOMY;  Surgeon: Stacia Glendia BRAVO, MD;  Location: [facility];  Service: Gastroenterology;  Laterality: N/A;   SUBMUCOSAL LIFTING INJECTION  02/17/2023   Procedure: SUBMUCOSAL LIFTING  INJECTION;  Surgeon: Stacia Glendia BRAVO, MD;  Location: [facility];  Service: Gastroenterology;;   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   Procedure: LEFT TOTAL HIP ARTHROPLASTY ANTERIOR APPROACH;  Surgeon: Ernie Cough, MD;  Location: [facility];  Service: Orthopedics;  Laterality: Left;  70 mins   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   Procedure: TOTAL KNEE ARTHROPLASTY;  Surgeon: Ernie Cough, MD;  [facility];  Service: Orthopedics;  Laterality: Left;  70 mins   UMBILICAL HERNIA REPAIR N/A 05/09/2022   Procedure: PRIMARY UMBILICAL HERNIA REPAIR;  Surgeon: Sheldon Standing, MD;  [facility];  Service: General;  Laterality: N/A;   XI ROBOTIC ASSISTED PARAESOPHAGEAL HERNIA REPAIR N/A 05/09/2022   Procedure: ROBOTIC REPAIR OF PARAESOPHAGEAL HIATAL HERNIA WITH FUNDOPLICATION;  Surgeon: Sheldon Standing, MD;  Location: [facility];  Service: General;  Laterality: N/A;  GEN w/ERAS LOCAL   Social History:  reports that he quit smoking about 50 years ago. His smoking use included cigarettes. He started smoking about 55 years ago. He has a 5 pack-year smoking history. He quit smokeless tobacco use about 58 years ago.  His smokeless tobacco use included chew. He reports that he does not currently use alcohol. He reports that he does not use drugs.  Allergies  Allergen Reactions   Remicade [Infliximab] Anaphylaxis   Erythromycin Nausea Only   Grass Pollen(K-O-R-T-Swt Vern) Other (See Comments)    Seasonal allergies    Pollen Extract Other (See Comments)   Sulfasalazine Nausea And Vomiting    Family History  Problem Relation Age of Onset   Leukemia Father    Bladder Cancer Father    Stomach cancer Neg Hx    Esophageal cancer Neg Hx    Colon cancer Neg Hx    Pancreatic cancer Neg Hx    Colon polyps Neg Hx    Rectal cancer Neg Hx     Prior to Admission medications   Medication Sig Start Date End Date Taking? Authorizing Provider  amLODipine -valsartan  (EXFORGE ) 10-320 MG tablet Take 1  tablet by mouth in the morning.    [provider]  amoxicillin (AMOXIL) 500 MG capsule Take 1,000 mg by mouth 2 (two) times daily. Patient not taking: Reported on 04/27/2023 02/19/23   [provider]  ANORO ELLIPTA  62.5-25 MCG/ACT AEPB Inhale 1 puff into the lungs daily. 10/13/22   [provider]  apixaban (ELIQUIS) 5 MG TABS tablet Take 5 mg by mouth 2 (two) times daily. Following recent surgery    [provider]  buPROPion  (WELLBUTRIN  XL) 300 MG 24 hr tablet Take 300 mg by mouth in the morning.    [provider]  cyclobenzaprine  (FLEXERIL ) 10 MG tablet Take 10 mg by mouth 3 (three) times daily as needed for muscle spasms. 08/24/20   [provider]  escitalopram  (LEXAPRO ) 20 MG tablet Take 20 mg by mouth in the morning.    [provider]  famotidine (PEPCID) 20 MG tablet     [provider]  fluticasone  (FLONASE ) 50 MCG/ACT nasal spray Place 1 spray into both nostrils daily. Patient taking differently: Place  1 spray into both nostrils daily as needed for allergies or rhinitis.    [provider]  hydrALAZINE  (APRESOLINE ) 50 MG tablet Take 50 mg by mouth 3 (three) times daily.    [provider]  hydroxychloroquine  (PLAQUENIL ) 200 MG tablet Take 200 mg by mouth 2 (two) times daily.    [provider]  ketoconazole  (NIZORAL ) 2 % shampoo Apply 1 Application topically every other day. Every other shower    [provider]  LORazepam  (ATIVAN ) 2 MG tablet Take 2 mg by mouth in the morning and at bedtime.    [provider]  meclizine  (ANTIVERT ) 25 MG tablet Take 25 mg by mouth 3 (three) times daily as needed for dizziness or nausea.    [provider]  metoCLOPramide  (REGLAN ) 5 MG tablet     [provider]  metoprolol  succinate (TOPROL -XL) 50 MG 24 hr tablet Take 50 mg by mouth in the morning. Take with or immediately following a meal. Patient not taking: Reported  on 04/27/2023    [provider]  omeprazole  (PRILOSEC) 20 MG capsule Take 20 mg by mouth in the morning. Patient not taking: Reported on 10/23/2022    [provider]  ondansetron  (ZOFRAN ) 4 MG tablet Take 4 mg by mouth every 6 (six) hours as needed.    [provider]  oxyCODONE -acetaminophen  (PERCOCET/ROXICET) 5-325 MG tablet Take 2 tablets by mouth every 8 (eight) hours as needed for severe pain (pain score 7-10). Patient not taking: Reported on 04/27/2023    [provider]  polyethylene glycol (MIRALAX  / GLYCOLAX ) 17 g packet Take by mouth. Patient not taking: Reported on 10/23/2022    [provider]  predniSONE  (DELTASONE ) 5 MG tablet Take 10 mg by mouth in the morning. 01/01/21   [provider]  tamsulosin (FLOMAX) 0.4 MG CAPS capsule Take 0.4 mg by mouth daily. Patient not taking: Reported on 04/27/2023    [provider]  traMADol  (ULTRAM ) 50 MG tablet Take 1-2 tablets (50-100 mg total) by mouth every 6 (six) hours as needed for moderate pain or severe pain. 05/09/22   Sheldon Standing, MD  zinc  gluconate 50 MG tablet Take 50 mg by mouth in the morning. Patient not taking: Reported on 10/23/2022    [provider]    Physical Exam: Vitals:   05/15/23 1600 05/15/23 1900 05/15/23 1904 05/15/23 2100  BP: (!) 118/57 (!) 126/56  136/65  Pulse: 61 68  70  Resp: 12 13  12   Temp:   98 F (36.7 C)   TempSrc:   Oral   SpO2: 94% 94%  95%  Weight:      Height:       General: Patient is alert and awake gives a coherent account of his symptoms appears to be in no distress.  Fully cooperative. Respiratory exam: Bilateral intravesicular Cardiovascular exam S1-S2 normal Abdomen all quadrants are soft nontender Extremities warm without edema Data Reviewed:  Labs on Admission:  Results for orders placed or performed during the hospital encounter of 05/15/23 (from the past 24 hours)  Resp panel by RT-PCR (RSV, Flu A&B,  Covid) Anterior Nasal Swab     Status: None   Collection Time: 05/15/23  3:15 PM   Specimen: Anterior Nasal Swab  Result Value Ref Range   SARS Coronavirus 2 by RT PCR NEGATIVE NEGATIVE   Influenza A by PCR NEGATIVE NEGATIVE   Influenza B by PCR NEGATIVE NEGATIVE   Resp Syncytial Virus by PCR NEGATIVE  NEGATIVE  CBC with Differential     Status: Abnormal   Collection Time: 05/15/23  3:40 PM  Result Value Ref Range   WBC 8.3 4.0 - 10.5 K/uL   RBC 4.03 (L) 4.22 - 5.81 MIL/uL   Hemoglobin 9.8 (L) 13.0 - 17.0 g/dL   HCT 67.3 (L) 60.9 - 47.9 %   MCV 80.9 80.0 - 100.0 fL   MCH 24.3 (L) 26.0 - 34.0 pg   MCHC 30.1 30.0 - 36.0 g/dL   RDW 81.6 (H) 88.4 - 84.4 %   Platelets 477 (H) 150 - 400 K/uL   nRBC 0.4 (H) 0.0 - 0.2 %   Neutrophils Relative % 68 %   Neutro Abs 5.7 1.7 - 7.7 K/uL   Lymphocytes Relative 12 %   Lymphs Abs 1.0 0.7 - 4.0 K/uL   Monocytes Relative 15 %   Monocytes Absolute 1.3 (H) 0.1 - 1.0 K/uL   Eosinophils Relative 5 %   Eosinophils Absolute 0.4 0.0 - 0.5 K/uL   Basophils Relative 0 %   Basophils Absolute 0.0 0.0 - 0.1 K/uL   Immature Granulocytes 0 %   Abs Immature Granulocytes 0.02 0.00 - 0.07 K/uL  I-Stat Lactic Acid, ED     Status: None   Collection Time: 05/15/23  3:48 PM  Result Value Ref Range   Lactic Acid, Venous 0.6 0.5 - 1.9 mmol/L  Comprehensive metabolic panel     Status: Abnormal   Collection Time: 05/15/23  5:05 PM  Result Value Ref Range   Sodium 138 135 - 145 mmol/L   Potassium 5.3 (H) 3.5 - 5.1 mmol/L   Chloride 119 (H) 98 - 111 mmol/L   CO2 12 (L) 22 - 32 mmol/L   Glucose, Bld 90 70 - 99 mg/dL   BUN 78 (H) 8 - 23 mg/dL   Creatinine, Ser 6.09 (H) 0.61 - 1.24 mg/dL   Calcium  9.6 8.9 - 10.3 mg/dL   Total Protein 6.7 6.5 - 8.1 g/dL   Albumin  3.6 3.5 - 5.0 g/dL   AST 20 15 - 41 U/L   ALT 21 0 - 44 U/L   Alkaline Phosphatase 63 38 - 126 U/L   Total Bilirubin 0.5 0.0 - 1.2 mg/dL   GFR, Estimated 15 (L) >60 mL/min   Anion gap 7 5 - 15   Troponin I (High Sensitivity)     Status: None   Collection Time: 05/15/23  5:05 PM  Result Value Ref Range   Troponin I (High Sensitivity) 4 <18 ng/L  Urinalysis, w/ Reflex to Culture (Infection Suspected) -Urine, Clean Catch     Status: Abnormal   Collection Time: 05/15/23  9:58 PM  Result Value Ref Range   Specimen Source URINE, CATHETERIZED    Color, Urine YELLOW YELLOW   APPearance HAZY (A) CLEAR   Specific Gravity, Urine 1.015 1.005 - 1.030   pH 5.0 5.0 - 8.0   Glucose, UA NEGATIVE NEGATIVE mg/dL   Hgb urine dipstick NEGATIVE NEGATIVE   Bilirubin Urine NEGATIVE NEGATIVE   Ketones, ur NEGATIVE NEGATIVE mg/dL   Protein, ur NEGATIVE NEGATIVE mg/dL   Nitrite NEGATIVE NEGATIVE   Leukocytes,Ua NEGATIVE NEGATIVE   RBC / HPF 0-5 0 - 5 RBC/hpf   WBC, UA 0-5 0 - 5 WBC/hpf   Bacteria, UA NONE SEEN NONE SEEN   Squamous Epithelial / HPF 0-5 0 - 5 /HPF   Basic Metabolic Panel: Recent Labs  Lab 05/15/23 1705  NA 138  K 5.3*  CL 119*  CO2 12*  GLUCOSE 90  BUN 78*  CREATININE 3.90*  CALCIUM  9.6   Liver Function Tests: Recent Labs  Lab 05/15/23 1705  AST 20  ALT 21  ALKPHOS 63  BILITOT 0.5  PROT 6.7  ALBUMIN  3.6   No results for input(s): LIPASE, AMYLASE in the last 168 hours. No results for input(s): AMMONIA in the last 168 hours. CBC: Recent Labs  Lab 05/15/23 1540  WBC 8.3  NEUTROABS 5.7  HGB 9.8*  HCT 32.6*  MCV 80.9  PLT 477*   Cardiac Enzymes: Recent Labs  Lab 05/15/23 1705  TROPONINIHS 4    BNP (last 3 results) No results for input(s): PROBNP in the last 8760 hours. CBG: No results for input(s): GLUCAP in the last 168 hours.  Radiological Exams on Admission:  CT ABDOMEN PELVIS WO CONTRAST Result Date: 05/15/2023 CLINICAL DATA:  Abdominal pain, fatigue, decreased appetite EXAM: CT ABDOMEN AND PELVIS WITHOUT CONTRAST TECHNIQUE: Multidetector CT imaging of the abdomen and pelvis was performed following the standard protocol without IV  contrast. RADIATION DOSE REDUCTION: This exam was performed according to the departmental dose-optimization program which includes automated exposure control, adjustment of the mA and/or kV according to patient size and/or use of iterative reconstruction technique. COMPARISON:  04/27/2023 FINDINGS: Lower chest: No acute abnormality. Hepatobiliary: No focal hepatic abnormality. Gallbladder unremarkable. Pancreas: No focal abnormality or ductal dilatation. Spleen: No focal abnormality.  Normal size. Adrenals/Urinary Tract: No suspicious renal or adrenal lesion. Scattered cysts throughout the right kidney. No follow-up imaging recommended. No stones or hydronephrosis. Urinary bladder unremarkable. Stomach/Bowel: Postoperative changes from subtotal colectomy. Continued stranding in the left pelvis near the ileocolic anastomosis. This is similar to prior study and may be postoperative. No bowel obstruction. Vascular/Lymphatic: Aortic atherosclerosis. No evidence of aneurysm or adenopathy. Reproductive: Prostate enlargement Other: No free fluid or free air. Musculoskeletal: No acute bony abnormality. Prior left hip replacement. IMPRESSION: Prior subtotal colectomy. Continued stranding in the pelvis to the left of the suture line, similar to prior study and may be postoperative. No focal fluid collection. No bowel obstruction. Aortic atherosclerosis. Prostate enlargement. Electronically Signed   By: Franky Crease M.D.   On: 05/15/2023 21:34   DG Chest 2 View Result Date: 05/15/2023 CLINICAL DATA:  Suspected sepsis.  Fatigue.  Decreased appetite. EXAM: CHEST - 2 VIEW COMPARISON:  None Available. FINDINGS: Bilateral lung fields are clear. Bilateral costophrenic angles are clear. Normal cardio-mediastinal silhouette. No acute osseous abnormalities. The soft tissues are within normal limits. IMPRESSION: No active cardiopulmonary disease. Electronically Signed   By: Ree Molt M.D.   On: 05/15/2023 16:11    EKG:  Independently reviewed. No peaked T waves   Assessment and Plan: * AKI (acute kidney injury) (HCC) Given the strong history of profuse diarrhea, this is felt to be prerenal.  We will continue with LR infusion.  Monitor I's and O's and daily weights.  Trend creatinine.  Pending urine sodium and creatinine level.  Associated with mild hyperkalemia.  And metabolic acidosis with normal anion gap.  Patient will be treated with sodium bicarbonate  tablet every 6 hourly for 4 doses.  We will trend the bicarbonate level.  Diarrhea This seems to have started around the last hospitalization of the patient in late December.  Patient not having any fever or white count or abdominal pain.  CAT scan abdomen pelvis nonactionable at this time.  Patient does have some chronic stranding around the site of prior colectomy.  Patient is pending stool for C.  difficile and pathogen panel.  At this time I will not start empiric antibiotics as patient is looking nontoxic.  Acute metabolic encephalopathy Was unable to be woken up today.  This is felt to be due to hypotension/AKI and metabolic acidosis.  Patient has had an excellent clinical response to IV fluid administration in the ER.  We will continue with same.  At this time acute metabolic encephalopathy has resolved.  Patient exam is completely nonfocal and wife reports that patient mentation is at baseline.  Patinet previously documented to be taking ativan  bid for depresion? I will change it to 1 mg bid prn for anxiety/insomnia. Would favor taper off this medicaiton over the long term as it may be contributing to mentation changes.  Lupus (systemic lupus erythematosus) (HCC) Per H&P from 04/28/2023 patinet on prednisone  ?5 mg . Will c.w. same this evening. No evidence of flair of lupus. Do not suspect need for stress dose steroids.  Essential hypertension Patinet BP was 103/58 on presentation. Likely due to hypovolemia. I will be holding off on any anti HTN agents  for this evening.   Indication of apixaban is ppx  blood clots after nov surgery. But it has been held since the GI bleeding in Decemebr. Patient NOT ON APIXABAN anymore.  Med rec penidng home med collection by pharmacy.    Advance Care Planning:   Code Status: Prior fulll code.   Consults:  none at this time.  Family Communication: wife at bedside. All questions answered.  Severity of Illness: The appropriate patient status for this patient is INPATIENT. Inpatient status is judged to be reasonable and necessary in order to provide the required intensity of service to ensure the patient's safety. The patient's presenting symptoms, physical exam findings, and initial radiographic and laboratory data in the context of their chronic comorbidities is felt to place them at high risk for further clinical deterioration. Furthermore, it is not anticipated that the patient will be medically stable for discharge from the hospital within 2 midnights of admission.   * I certify that at the point of admission it is my clinical judgment that the patient will require inpatient hospital care spanning beyond 2 midnights from the point of admission due to high intensity of service, high risk for further deterioration and high frequency of surveillance required.*  Author: Jacqulyn Divine, MD 05/15/2023 10:22 PM  For on call review [email or web address].

## 2023-05-15 NOTE — Assessment & Plan Note (Signed)
 Given the strong history of profuse diarrhea, this is felt to be prerenal.  We will continue with LR infusion.  Monitor I's and O's and daily weights.  Trend creatinine.  Pending urine sodium and creatinine level.  Associated with mild hyperkalemia.  And metabolic acidosis with normal anion gap.  Patient will be treated with sodium bicarbonate  tablet every 6 hourly for 4 doses.  We will trend the bicarbonate level.

## 2023-05-15 NOTE — ED Provider Notes (Signed)
 [place] EMERGENCY DEPARTMENT AT [facility] Provider Note   CSN: [id number] Arrival date & time: 05/15/23  1342     History  Chief Complaint  Patient presents with   Fatigue    Herbert Morrison is a 78 y.o. male.  HPI   Patient has history of hypertension ulcerative colitis acid reflux depression anxiety rheumatoid arthritis, lupus.  Patient has been admitted to the hospital recently for complications associated with his ulcerative colitis.  He was also admitted in December for rectal bleeding.  Patient had a recent hemicolectomy in November.  Patient had been having issues with a lot of diarrhea but that has actually been decreasing in the last 4 days although he continues to have diarrhea.  He has been getting more and more weak.  Patient's wife states he has not really been able to get out of bed in the last couple of days.  He has had decreased appetite.  He has not been eating or drinking as much.  He has not been urinating as much.  Has not noticed any blood in his stools.  No known fevers.  He has been having some slight cough and some discomfort on the left side of his chest with coughing.  Wife also states he seems to be more confused and is taking more time to process things.  She also thought that he has an odor to him that seems off.  Home Medications Prior to Admission medications   Medication Sig Start Date End Date Taking? Authorizing Provider  amLODipine -valsartan  (EXFORGE ) 10-320 MG tablet Take 1 tablet by mouth in the morning.    [provider]  amoxicillin (AMOXIL) 500 MG capsule Take 1,000 mg by mouth 2 (two) times daily. Patient not taking: Reported on 04/27/2023 02/19/23   [provider]  ANORO ELLIPTA  62.5-25 MCG/ACT AEPB Inhale 1 puff into the lungs daily. 10/13/22   [provider]  apixaban (ELIQUIS) 5 MG TABS tablet Take 5 mg by mouth 2 (two) times daily. Following recent surgery    [provider]  buPROPion   (WELLBUTRIN  XL) 300 MG 24 hr tablet Take 300 mg by mouth in the morning.    [provider]  cyclobenzaprine  (FLEXERIL ) 10 MG tablet Take 10 mg by mouth 3 (three) times daily as needed for muscle spasms. 08/24/20   [provider]  escitalopram  (LEXAPRO ) 20 MG tablet Take 20 mg by mouth in the morning.    [provider]  famotidine (PEPCID) 20 MG tablet     [provider]  fluticasone  (FLONASE ) 50 MCG/ACT nasal spray Place 1 spray into both nostrils daily. Patient taking differently: Place 1 spray into both nostrils daily as needed for allergies or rhinitis.    [provider]  hydrALAZINE  (APRESOLINE ) 50 MG tablet Take 50 mg by mouth 3 (three) times daily.    [provider]  hydroxychloroquine  (PLAQUENIL ) 200 MG tablet Take 200 mg by mouth 2 (two) times daily.    [provider]  ketoconazole  (NIZORAL ) 2 % shampoo Apply 1 Application topically every other day. Every other shower    [provider]  LORazepam  (ATIVAN ) 2 MG tablet Take 2 mg by mouth in the morning and at bedtime.    [provider]  meclizine  (ANTIVERT ) 25 MG tablet Take 25 mg by mouth 3 (three) times daily as needed for dizziness or nausea.    [provider]  metoCLOPramide  (REGLAN ) 5 MG tablet     [provider]  metoprolol  succinate (TOPROL -XL) 50 MG 24 hr tablet Take 50 mg by mouth in the morning. Take with or immediately following a meal. Patient not taking: Reported on 04/27/2023    [provider]  omeprazole  (PRILOSEC) 20 MG capsule Take 20 mg by mouth in the morning. Patient not taking: Reported on 10/23/2022    [provider]  ondansetron  (ZOFRAN ) 4 MG tablet Take 4 mg by mouth every 6 (six) hours as needed.    [provider]  oxyCODONE -acetaminophen  (PERCOCET/ROXICET) 5-325 MG tablet Take 2 tablets by mouth every 8 (eight) hours as needed for severe pain (pain score 7-10). Patient not taking:  Reported on 04/27/2023    [provider]  polyethylene glycol (MIRALAX  / GLYCOLAX ) 17 g packet Take by mouth. Patient not taking: Reported on 10/23/2022    [provider]  predniSONE  (DELTASONE ) 5 MG tablet Take 10 mg by mouth in the morning. 01/01/21   [provider]  tamsulosin (FLOMAX) 0.4 MG CAPS capsule Take 0.4 mg by mouth daily. Patient not taking: Reported on 04/27/2023    [provider]  traMADol  (ULTRAM ) 50 MG tablet Take 1-2 tablets (50-100 mg total) by mouth every 6 (six) hours as needed for moderate pain or severe pain. 05/09/22   Sheldon Standing, MD  zinc  gluconate 50 MG tablet Take 50 mg by mouth in the morning. Patient not taking: Reported on 10/23/2022    [provider]      Allergies    Remicade [infliximab], Erythromycin, Grass pollen([id number] vern), Pollen extract, and Sulfasalazine    Review of Systems   Review of Systems  Physical Exam Updated Vital Signs BP (!) 103/58   Pulse 80   Temp (!) 97.4 F (36.3 C) (Oral)   Resp 16   Ht 1.676 m (5' 6)   Wt 85.7 kg   SpO2 97%   BMI 30.51 kg/m  Physical Exam Vitals and nursing note reviewed.  Constitutional:      General: He is not in acute distress.    Appearance: He is well-developed. He is ill-appearing.  HENT:     Head: Normocephalic and atraumatic.     Right Ear: External ear normal.     Left Ear: External ear normal.     Mouth/Throat:     Mouth: Mucous membranes are dry.  Eyes:     General: No scleral icterus.       Right eye: No discharge.        Left eye: No discharge.     Conjunctiva/sclera: Conjunctivae normal.  Neck:     Trachea: No tracheal deviation.  Cardiovascular:     Rate and Rhythm: Normal rate and regular rhythm.  Pulmonary:     Effort: Pulmonary effort is normal. No respiratory distress.     Breath sounds: Normal breath sounds. No stridor. No wheezing or rales.  Abdominal:     General: Bowel sounds are normal. There is no distension.      Palpations: Abdomen is soft.     Tenderness: There is no abdominal tenderness. There is no guarding or rebound.  Musculoskeletal:        General: No tenderness or deformity.     Cervical back: Neck supple.  Skin:    General: Skin is warm and dry.     Findings: No rash.  Neurological:     General: No focal deficit present.     Mental Status: He is alert.     Cranial Nerves: No cranial nerve  deficit, dysarthria or facial asymmetry.     Sensory: No sensory deficit.     Motor: Weakness present. No abnormal muscle tone or seizure activity.     Coordination: Coordination normal.  Psychiatric:        Mood and Affect: Mood normal.     ED Results / Procedures / Treatments   Labs (all labs ordered are listed, but only abnormal results are displayed) Labs Reviewed  CULTURE, BLOOD (ROUTINE X 2)  CULTURE, BLOOD (ROUTINE X 2)  COMPREHENSIVE METABOLIC PANEL  CBC WITH DIFFERENTIAL/PLATELET  URINALYSIS, W/ REFLEX TO CULTURE (INFECTION SUSPECTED)  LIPASE, BLOOD  I-STAT CG4 LACTIC ACID, ED    EKG None  Radiology No results found.  Procedures Procedures    Medications Ordered in ED Medications  sodium chloride  0.9 % bolus 1,000 mL (has no administration in time range)    ED Course/ Medical Decision Making/ A&P Clinical Course as of 05/15/23 1715  Fri May 15, 2023  1634 CBC with Differential(!) CBC shows stable anemia [JK]  1702 77 YOM with a chief complaint of malaise and fatigue. Hx of UC s/p Hemicolectomy in Nov. GIB in Dec. Weakness and decreased appetite over the past few days. LUQ pain intermittently. Image if labs nondiagnostic. [CC]    Clinical Course User Index [CC] Jerral Meth, MD [JK] Randol Simmonds, MD                                 Medical Decision Making Differential diagnosis includes but not limited to anemia dehydration acute renal failure acute infectious etiology  Amount and/or Complexity of Data Reviewed Labs: ordered. Decision-making details  documented in ED Course. Radiology: ordered.   Patient with complicated recent medical history.  He presents to the ED with fatigue and weakness.  Decreased p.o. intake.  Patient appears dehydrated on exam.  Laboratory tests have been ordered.  Currently pending.  Case turned over to Dr Jerral at shift change.        Final Clinical Impression(s) / ED Diagnoses Final diagnoses:  None    Rx / DC Orders ED Discharge Orders     None         Randol Simmonds, MD 05/15/23 1717

## 2023-05-15 NOTE — Telephone Encounter (Signed)
 Patient wife called and stated that her husband has been very fatigued, very weak to the point where her husband can not stand or sit, as well as him being very confused and not being able to hardly think or respond when asked a questioned. Patient wife also stated that he has not used the bathroom at all for anything. Patient wife is requesting a call back. I advise her to take patient to the hospital. Please advise.

## 2023-05-15 NOTE — ED Triage Notes (Signed)
 Fatigue, lethargy, decreased appetite for 4 days. Pt wife states that pt has been declining daily and is unable to get out of bed without help, decrease urination, not drinking fluids.

## 2023-05-15 NOTE — ED Notes (Signed)
 ..ED TO INPATIENT HANDOFF REPORT  ED Nurse Name and Phone #: Selinda Lang RAMAN Name/Age/Gender Debby CHRISTELLA Barrack 78 y.o. male Room/Bed: WA21/WA21  Code Status   Code Status: Full Code  Home/SNF/Other Home Patient oriented to: self, place, time, and situation Is this baseline? Yes   Triage Complete: Triage complete  Chief Complaint AKI (acute kidney injury) (HCC) [N17.9]  Triage Note Fatigue, lethargy, decreased appetite for 4 days. Pt wife states that pt has been declining daily and is unable to get out of bed without help, decrease urination, not drinking fluids.    Allergies Allergies  Allergen Reactions   Remicade [Infliximab] Anaphylaxis   Erythromycin Nausea Only   Grass Pollen(K-O-R-T-Swt Vern) Other (See Comments)    Seasonal allergies    Pollen Extract Other (See Comments)   Sulfasalazine Nausea And Vomiting    Level of Care/Admitting Diagnosis ED Disposition     ED Disposition  Admit   Condition  --   Comment  Hospital Area: [facility] [[id number]]  Level of Care: Med-Surg [16]  May admit patient to [facility] or [facility] if equivalent level of care is available:: No  Covid Evaluation: Asymptomatic - no recent exposure (last 10 days) testing not required  Diagnosis: AKI (acute kidney injury) [facility]) [[id number]]  Admitting Physician: MOODY ALTO [[id number]]  Attending Physician: MOODY ALTO [[id number]]  Certification:: I certify this patient will need inpatient services for at least 2 midnights  Expected Medical Readiness: 05/17/2023          B Medical/Surgery History 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   Depression    Dyspnea    GERD (gastroesophageal reflux disease)    Grade I diastolic dysfunction 04/28/2023   Heart murmur    as a child   Hiatal hernia    History of hiatal hernia    Hypertension    Lupus 12/31/2020   Rheumatoid  arthritis (HCC) 12/31/2020   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[facility])    Past Surgical History:  Procedure Laterality Date   BIOPSY  01/30/2021   Procedure: BIOPSY;  Surgeon: Golda Claudis PENNER, MD;  [facility];  Service: Endoscopy;;   CATARACT EXTRACTION Left 2023   COLONOSCOPY N/A 04/09/2016   Procedure: COLONOSCOPY;  Surgeon: Claudis PENNER Golda, MD;  [facility];  Service: Endoscopy;  Laterality: N/A;  1:45   COLONOSCOPY WITH PROPOFOL  N/A 01/30/2021   Procedure: COLONOSCOPY WITH PROPOFOL ;  Surgeon: Golda Claudis PENNER, MD;  [facility];  Service: Endoscopy;  Laterality: N/A;   COLONOSCOPY WITH PROPOFOL  N/A 02/17/2023   Procedure: COLONOSCOPY WITH PROPOFOL ;  Surgeon: Stacia Glendia BRAVO, MD;  [facility];  Service: Gastroenterology;  Laterality: N/A;   ELBOW BURSA SURGERY Left 1983   ESOPHAGOGASTRODUODENOSCOPY (EGD) WITH PROPOFOL  N/A 01/30/2021   Procedure: ESOPHAGOGASTRODUODENOSCOPY (EGD) WITH PROPOFOL ;  Surgeon: Golda Claudis PENNER, MD;  [facility];  Service: Endoscopy;  Laterality: N/A;  2:00, pt knows to arrive at 11:00   HEMOSTASIS CLIP PLACEMENT  02/17/2023   Procedure: HEMOSTASIS CLIP PLACEMENT;  Surgeon: Stacia Glendia BRAVO, MD;  [facility];  Service: Gastroenterology;;   KNEE ARTHROSCOPY Right 1979   left elbow     spur removed   left knee surgery     open surgery for a meniscus tear 1970's   lungs  07/2022   POLYPECTOMY  04/09/2016  Procedure: POLYPECTOMY;  Surgeon: Claudis RAYMOND Rivet, MD;  [facility];  Service: Endoscopy;;  cecal polyp   POLYPECTOMY  01/30/2021   Procedure: POLYPECTOMY;  Surgeon: Rivet Claudis RAYMOND, MD;  [facility];  Service: Endoscopy;;   POLYPECTOMY N/A 02/17/2023   Procedure: POLYPECTOMY;  Surgeon: Stacia Glendia BRAVO, MD;  Location: [facility];  Service: Gastroenterology;  Laterality: N/A;   SUBMUCOSAL LIFTING INJECTION  02/17/2023   Procedure: SUBMUCOSAL LIFTING INJECTION;   Surgeon: Stacia Glendia BRAVO, MD;  Location: [facility];  Service: Gastroenterology;;   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   Procedure: LEFT TOTAL HIP ARTHROPLASTY ANTERIOR APPROACH;  Surgeon: Ernie Cough, MD;  [facility];  Service: Orthopedics;  Laterality: Left;  70 mins   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   Procedure: TOTAL KNEE ARTHROPLASTY;  Surgeon: Ernie Cough, MD;  [facility];  Service: Orthopedics;  Laterality: Left;  70 mins   UMBILICAL HERNIA REPAIR N/A 05/09/2022   Procedure: PRIMARY UMBILICAL HERNIA REPAIR;  Surgeon: Sheldon Standing, MD;  [facility];  Service: General;  Laterality: N/A;   XI ROBOTIC ASSISTED PARAESOPHAGEAL HERNIA REPAIR N/A 05/09/2022   Procedure: ROBOTIC REPAIR OF PARAESOPHAGEAL HIATAL HERNIA WITH FUNDOPLICATION;  Surgeon: Sheldon Standing, MD;  [facility];  Service: General;  Laterality: N/A;  GEN w/ERAS LOCAL     A IV Location/Drains/Wounds Patient Lines/Drains/Airways Status     Active Line/Drains/Airways     Name Placement date Placement time Site Days   Peripheral IV 05/15/23 20 G 1 Right Antecubital 05/15/23  1538  Antecubital  less than 1   Incision - 5 Ports Abdomen Right Left;Upper;Umbilicus Left Left;Lower;Lateral Upper;Mid 05/09/22  0945  -- 371            Intake/Output Last 24 hours  Intake/Output Summary (Last 24 hours) at 05/15/2023 2317 Last data filed at 05/15/2023 2141 Gross per 24 hour  Intake 1000 ml  Output --  Net 1000 ml    Labs/Imaging Results for orders placed or performed during the hospital encounter of 05/15/23 (from the past 48 hours)  Resp panel by RT-PCR (RSV, Flu A&B, Covid) Anterior Nasal Swab     Status: None   Collection Time: 05/15/23  3:15 PM   Specimen: Anterior Nasal Swab  Result Value Ref Range   SARS Coronavirus 2 by RT PCR NEGATIVE NEGATIVE    Comment: (NOTE) SARS-CoV-2 target nucleic acids are NOT DETECTED.  The SARS-CoV-2 RNA is generally detectable in upper  respiratory specimens during the acute phase of infection. The lowest concentration of SARS-CoV-2 viral copies this assay can detect is 138 copies/mL. A negative result does not preclude SARS-Cov-2 infection and should not be used as the sole basis for treatment or other patient management decisions. A negative result may occur with  improper specimen collection/handling, submission of specimen other than nasopharyngeal swab, presence of viral mutation(s) within the areas targeted by this assay, and inadequate number of viral copies(<138 copies/mL). A negative result must be combined with clinical observations, patient history, and epidemiological information. The expected result is Negative.  Fact Sheet for Patients:  bloggercourse.com  Fact Sheet for Healthcare Providers:  seriousbroker.it  This test is no t yet approved or cleared by the [place]  FDA and  has been authorized for detection and/or diagnosis of SARS-CoV-2 by FDA under an Emergency Use Authorization (EUA). This EUA will remain  in effect (meaning this test can be used) for the duration of the COVID-19 declaration under Section 564(b)(1) of  the Act, 21 U.S.C.section 360bbb-3(b)(1), unless the authorization is terminated  or revoked sooner.       Influenza A by PCR NEGATIVE NEGATIVE   Influenza B by PCR NEGATIVE NEGATIVE    Comment: (NOTE) The Xpert Xpress SARS-CoV-2/FLU/RSV plus assay is intended as an aid in the diagnosis of influenza from Nasopharyngeal swab specimens and should not be used as a sole basis for treatment. Nasal washings and aspirates are unacceptable for Xpert Xpress SARS-CoV-2/FLU/RSV testing.  Fact Sheet for Patients: bloggercourse.com  Fact Sheet for Healthcare Providers: seriousbroker.it  This test is not yet approved or cleared by the [place]  FDA and has been authorized for  detection and/or diagnosis of SARS-CoV-2 by FDA under an Emergency Use Authorization (EUA). This EUA will remain in effect (meaning this test can be used) for the duration of the COVID-19 declaration under Section 564(b)(1) of the Act, 21 U.S.C. section 360bbb-3(b)(1), unless the authorization is terminated or revoked.     Resp Syncytial Virus by PCR NEGATIVE NEGATIVE    Comment: (NOTE) Fact Sheet for Patients: bloggercourse.com  Fact Sheet for Healthcare Providers: seriousbroker.it  This test is not yet approved or cleared by the [place]  FDA and has been authorized for detection and/or diagnosis of SARS-CoV-2 by FDA under an Emergency Use Authorization (EUA). This EUA will remain in effect (meaning this test can be used) for the duration of the COVID-19 declaration under Section 564(b)(1) of the Act, 21 U.S.C. section 360bbb-3(b)(1), unless the authorization is terminated or revoked.  Performed at [facility], [address], [place], [place] [address]   CBC with Differential     Status: Abnormal   Collection Time: 05/15/23  3:40 PM  Result Value Ref Range   WBC 8.3 4.0 - 10.5 K/uL   RBC 4.03 (L) 4.22 - 5.81 MIL/uL   Hemoglobin 9.8 (L) 13.0 - 17.0 g/dL   HCT 67.3 (L) 60.9 - 47.9 %   MCV 80.9 80.0 - 100.0 fL   MCH 24.3 (L) 26.0 - 34.0 pg   MCHC 30.1 30.0 - 36.0 g/dL   RDW 81.6 (H) 88.4 - 84.4 %   Platelets 477 (H) 150 - 400 K/uL   nRBC 0.4 (H) 0.0 - 0.2 %   Neutrophils Relative % 68 %   Neutro Abs 5.7 1.7 - 7.7 K/uL   Lymphocytes Relative 12 %   Lymphs Abs 1.0 0.7 - 4.0 K/uL   Monocytes Relative 15 %   Monocytes Absolute 1.3 (H) 0.1 - 1.0 K/uL   Eosinophils Relative 5 %   Eosinophils Absolute 0.4 0.0 - 0.5 K/uL   Basophils Relative 0 %   Basophils Absolute 0.0 0.0 - 0.1 K/uL   Immature Granulocytes 0 %   Abs Immature Granulocytes 0.02 0.00 - 0.07 K/uL    Comment: Performed at [facility], [address], [place], [place] [address]  I-Stat Lactic Acid, ED     Status: None   Collection Time: 05/15/23  3:48 PM  Result Value Ref Range   Lactic Acid, Venous 0.6 0.5 - 1.9 mmol/L  Comprehensive metabolic panel     Status: Abnormal   Collection Time: 05/15/23  5:05 PM  Result Value Ref Range   Sodium 138 135 - 145 mmol/L   Potassium 5.3 (H) 3.5 - 5.1 mmol/L   Chloride 119 (H) 98 - 111 mmol/L   CO2 12 (L) 22 - 32 mmol/L   Glucose, Bld 90 70 - 99 mg/dL    Comment:  Glucose reference range applies only to samples taken after fasting for at least 8 hours.   BUN 78 (H) 8 - 23 mg/dL   Creatinine, Ser 6.09 (H) 0.61 - 1.24 mg/dL   Calcium  9.6 8.9 - 10.3 mg/dL   Total Protein 6.7 6.5 - 8.1 g/dL   Albumin  3.6 3.5 - 5.0 g/dL   AST 20 15 - 41 U/L   ALT 21 0 - 44 U/L   Alkaline Phosphatase 63 38 - 126 U/L   Total Bilirubin 0.5 0.0 - 1.2 mg/dL   GFR, Estimated 15 (L) >60 mL/min    Comment: (NOTE) Calculated using the CKD-EPI Creatinine Equation (2021)    Anion gap 7 5 - 15    Comment: Performed at [facility], [address], [place], [place] [address]  Troponin I (High Sensitivity)     Status: None   Collection Time: 05/15/23  5:05 PM  Result Value Ref Range   Troponin I (High Sensitivity) 4 <18 ng/L    Comment: (NOTE) Elevated high sensitivity troponin I (hsTnI) values and significant  changes across serial measurements may suggest ACS but many other  chronic and acute conditions are known to elevate hsTnI results.  Refer to the Links section for chest pain algorithms and additional  guidance. Performed at [facility], [address], [place], [place] [address]   Urinalysis, w/ Reflex to Culture (Infection Suspected) -Urine, Clean Catch     Status: Abnormal   Collection Time: 05/15/23  9:58 PM  Result Value Ref Range   Specimen Source URINE, CATHETERIZED    Color, Urine YELLOW YELLOW   APPearance HAZY (A) CLEAR    Specific Gravity, Urine 1.015 1.005 - 1.030   pH 5.0 5.0 - 8.0   Glucose, UA NEGATIVE NEGATIVE mg/dL   Hgb urine dipstick NEGATIVE NEGATIVE   Bilirubin Urine NEGATIVE NEGATIVE   Ketones, ur NEGATIVE NEGATIVE mg/dL   Protein, ur NEGATIVE NEGATIVE mg/dL   Nitrite NEGATIVE NEGATIVE   Leukocytes,Ua NEGATIVE NEGATIVE   RBC / HPF 0-5 0 - 5 RBC/hpf   WBC, UA 0-5 0 - 5 WBC/hpf    Comment:        Reflex urine culture not performed if WBC <=10, OR if Squamous epithelial cells >5. If Squamous epithelial cells >5 suggest recollection.    Bacteria, UA NONE SEEN NONE SEEN   Squamous Epithelial / HPF 0-5 0 - 5 /HPF    Comment: Performed at [facility], [address] [place], [place] [address]  CBC     Status: Abnormal   Collection Time: 05/15/23 11:00 PM  Result Value Ref Range   WBC 7.4 4.0 - 10.5 K/uL   RBC 3.84 (L) 4.22 - 5.81 MIL/uL   Hemoglobin 9.0 (L) 13.0 - 17.0 g/dL   HCT 68.2 (L) 60.9 - 47.9 %   MCV 82.6 80.0 - 100.0 fL   MCH 23.4 (L) 26.0 - 34.0 pg   MCHC 28.4 (L) 30.0 - 36.0 g/dL   RDW 81.4 (H) 88.4 - 84.4 %   Platelets 410 (H) 150 - 400 K/uL   nRBC 0.0 0.0 - 0.2 %    Comment: Performed at [facility], [address], [place], [place] [address]   CT ABDOMEN PELVIS WO CONTRAST Result Date: 05/15/2023 CLINICAL DATA:  Abdominal pain, fatigue, decreased appetite EXAM: CT ABDOMEN AND PELVIS WITHOUT CONTRAST TECHNIQUE: Multidetector CT imaging of the abdomen and pelvis was performed following the standard protocol without IV  contrast. RADIATION DOSE REDUCTION: This exam was performed according to the departmental dose-optimization program which includes automated exposure control, adjustment of the mA and/or kV according to patient size and/or use of iterative reconstruction technique. COMPARISON:  04/27/2023 FINDINGS: Lower chest: No acute abnormality. Hepatobiliary: No focal hepatic abnormality. Gallbladder unremarkable. Pancreas: No focal  abnormality or ductal dilatation. Spleen: No focal abnormality.  Normal size. Adrenals/Urinary Tract: No suspicious renal or adrenal lesion. Scattered cysts throughout the right kidney. No follow-up imaging recommended. No stones or hydronephrosis. Urinary bladder unremarkable. Stomach/Bowel: Postoperative changes from subtotal colectomy. Continued stranding in the left pelvis near the ileocolic anastomosis. This is similar to prior study and may be postoperative. No bowel obstruction. Vascular/Lymphatic: Aortic atherosclerosis. No evidence of aneurysm or adenopathy. Reproductive: Prostate enlargement Other: No free fluid or free air. Musculoskeletal: No acute bony abnormality. Prior left hip replacement. IMPRESSION: Prior subtotal colectomy. Continued stranding in the pelvis to the left of the suture line, similar to prior study and may be postoperative. No focal fluid collection. No bowel obstruction. Aortic atherosclerosis. Prostate enlargement. Electronically Signed   By: Franky Crease M.D.   On: 05/15/2023 21:34   DG Chest 2 View Result Date: 05/15/2023 CLINICAL DATA:  Suspected sepsis.  Fatigue.  Decreased appetite. EXAM: CHEST - 2 VIEW COMPARISON:  None Available. FINDINGS: Bilateral lung fields are clear. Bilateral costophrenic angles are clear. Normal cardio-mediastinal silhouette. No acute osseous abnormalities. The soft tissues are within normal limits. IMPRESSION: No active cardiopulmonary disease. Electronically Signed   By: Ree Molt M.D.   On: 05/15/2023 16:11    Pending Labs Unresulted Labs (From admission, onward)     Start     Ordered   05/16/23 0500  APTT  Tomorrow morning,   R        05/15/23 2237   05/16/23 0500  Protime-INR  Tomorrow morning,   R        05/15/23 2237   05/16/23 0500  Basic metabolic panel  Tomorrow morning,   R        05/15/23 2237   05/16/23 0500  CBC  Tomorrow morning,   R        05/15/23 2237   05/15/23 2237  Creatinine, serum  (heparin )  Once,   R        Comments: Baseline for heparin  therapy IF NOT ALREADY DRAWN.    05/15/23 2237   05/15/23 2234  Sodium, urine, random  Add-on,   AD        05/15/23 2233   05/15/23 2234  Creatinine, urine, random  Add-on,   AD        05/15/23 2233   05/15/23 2148  Gastrointestinal Panel by PCR , Stool  (Gastrointestinal Panel by PCR, Stool                                                                                                                                                     **  Does Not include CLOSTRIDIUM DIFFICILE testing. **If CDIFF testing is needed, place order from the C Difficile Testing order set.**)  Once,   URGENT        05/15/23 2147   05/15/23 2148  C Difficile Quick Screen w PCR reflex  (C Difficile quick screen w PCR reflex panel )  Once, for 24 hours,   URGENT       References:    CDiff Information Tool   05/15/23 2147   05/15/23 1508  Lipase, blood  Once,   STAT        05/15/23 1507   05/15/23 1413  Culture, blood (Routine x 2)  BLOOD CULTURE X 2,   R (with STAT occurrences)      05/15/23 1412            Vitals/Pain Today's Vitals   05/15/23 1900 05/15/23 1904 05/15/23 2100 05/15/23 2255  BP: (!) 126/56  136/65   Pulse: 68  70   Resp: 13  12   Temp:  98 F (36.7 C)  97.8 F (36.6 C)  TempSrc:  Oral  Oral  SpO2: 94%  95%   Weight:      Height:      PainSc:        Isolation Precautions Enteric precautions (UV disinfection)  Medications Medications  lactated ringers  infusion ( Intravenous New Bag/Given 05/15/23 2255)  sodium bicarbonate  tablet 650 mg (650 mg Oral Not Given 05/15/23 2316)  acetaminophen  (TYLENOL ) tablet 650 mg (has no administration in time range)    Or  acetaminophen  (TYLENOL ) suppository 650 mg (has no administration in time range)  polyethylene glycol (MIRALAX  / GLYCOLAX ) packet 17 g (has no administration in time range)  heparin  injection 5,000 Units (has no administration in time range)  sodium chloride  flush (NS) 0.9 % injection 3 mL (3 mLs  Intravenous Not Given 05/15/23 2301)  LORazepam  (ATIVAN ) tablet 1 mg (has no administration in time range)  predniSONE  (DELTASONE ) tablet 10 mg (has no administration in time range)  sodium chloride  0.9 % bolus 1,000 mL (0 mLs Intravenous Stopped 05/15/23 2141)    Mobility walks with person assist     Focused Assessments    R Recommendations: See Admitting Provider Note  Report given to:   Additional Notes:

## 2023-05-15 NOTE — ED Provider Notes (Signed)
 Care of patient received from prior provider at 5:04 PM, please see their note for complete H/P and care plan.  Received handoff per ED course.  Clinical Course as of 05/15/23 1704  Fri May 15, 2023  1634 CBC with Differential(!) CBC shows stable anemia [JK]  1702 77 YOM with a chief complaint of malaise and fatigue. Hx of UC s/p Hemicolectomy in Nov. GIB in Dec. Weakness and decreased appetite over the past few days. LUQ pain intermittently. Image if labs nondiagnostic. [CC]    Clinical Course User Index [CC] Jerral Meth, MD [JK] Randol Simmonds, MD    Reassessment: Labs nondiagnostic.  Proceed with CT shows ongoing colitis.  Consulted medicine for management of AKI and other symptoms.  Disposition:   Based on the above findings, I believe this patient is stable for admission.    Patient/family educated about specific findings on our evaluation and explained exact reasons for admission.  Patient/family educated about clinical situation and time was allowed to answer questions.   Admission team communicated with and agreed with need for admission. Patient admitted. Patient ready to move at this time.     Emergency Department Medication Summary:   Medications  lactated ringers  infusion ( Intravenous New Bag/Given 05/15/23 2255)  sodium bicarbonate  tablet 650 mg (650 mg Oral Not Given 05/15/23 2316)  acetaminophen  (TYLENOL ) tablet 650 mg (has no administration in time range)    Or  acetaminophen  (TYLENOL ) suppository 650 mg (has no administration in time range)  polyethylene glycol (MIRALAX  / GLYCOLAX ) packet 17 g (has no administration in time range)  heparin  injection 5,000 Units (has no administration in time range)  sodium chloride  flush (NS) 0.9 % injection 3 mL (3 mLs Intravenous Not Given 05/15/23 2301)  LORazepam  (ATIVAN ) tablet 1 mg (has no administration in time range)  predniSONE  (DELTASONE ) tablet 10 mg (has no administration in time range)  sodium chloride  0.9 % bolus 1,000  mL (0 mLs Intravenous Stopped 05/15/23 2141)            Jerral Meth, MD 05/16/23 0020

## 2023-05-15 NOTE — ED Notes (Signed)
 Pt refusing antacid tablet. Pt stated he does not take one regularly and has not since his hiatal hernia surgery. Pt requesting to take tablet in the morning time if possible. Will send with Pt upstairs when admitted

## 2023-05-16 DIAGNOSIS — N179 Acute kidney failure, unspecified: Secondary | ICD-10-CM | POA: Diagnosis not present

## 2023-05-16 LAB — CREATININE, SERUM
Creatinine, Ser: 3.48 mg/dL — ABNORMAL HIGH (ref 0.61–1.24)
GFR, Estimated: 18 mL/min — ABNORMAL LOW (ref >60)

## 2023-05-16 LAB — BASIC METABOLIC PANEL
Anion gap: 7 (ref 5–15)
BUN: 78 mg/dL — ABNORMAL HIGH (ref 8–23)
CO2: 10 mmol/L — ABNORMAL LOW (ref 22–32)
Calcium: 9.3 mg/dL (ref 8.9–10.3)
Chloride: 120 mmol/L — ABNORMAL HIGH (ref 98–111)
Creatinine, Ser: 3.54 mg/dL — ABNORMAL HIGH (ref 0.61–1.24)
GFR, Estimated: 17 mL/min — ABNORMAL LOW (ref >60)
Glucose, Bld: 112 mg/dL — ABNORMAL HIGH (ref 70–99)
Potassium: 4.7 mmol/L (ref 3.5–5.1)
Sodium: 137 mmol/L (ref 135–145)

## 2023-05-16 LAB — PROTIME-INR
INR: 1.2 (ref 0.8–1.2)
Prothrombin Time: 15.1 s (ref 11.4–15.2)

## 2023-05-16 LAB — CBC
HCT: 32.1 % — ABNORMAL LOW (ref 39.0–52.0)
Hemoglobin: 9.4 g/dL — ABNORMAL LOW (ref 13.0–17.0)
MCH: 24 pg — ABNORMAL LOW (ref 26.0–34.0)
MCHC: 29.3 g/dL — ABNORMAL LOW (ref 30.0–36.0)
MCV: 82.1 fL (ref 80.0–100.0)
Platelets: 436 10*3/uL — ABNORMAL HIGH (ref 150–400)
RBC: 3.91 MIL/uL — ABNORMAL LOW (ref 4.22–5.81)
RDW: 18.4 % — ABNORMAL HIGH (ref 11.5–15.5)
WBC: 7.5 10*3/uL (ref 4.0–10.5)
nRBC: 0 % (ref 0.0–0.2)

## 2023-05-16 LAB — C DIFFICILE QUICK SCREEN W PCR REFLEX
C Diff antigen: NEGATIVE
C Diff interpretation: NOT DETECTED
C Diff toxin: NEGATIVE

## 2023-05-16 LAB — TROPONIN I (HIGH SENSITIVITY): Troponin I (High Sensitivity): 4 ng/L (ref <18)

## 2023-05-16 LAB — LIPASE, BLOOD: Lipase: 56 U/L — ABNORMAL HIGH (ref 11–51)

## 2023-05-16 LAB — APTT: aPTT: 27 s (ref 24–36)

## 2023-05-16 MED ORDER — BUPROPION HCL ER (XL) 300 MG PO TB24: 300.0000 mg | ORAL_TABLET | Freq: Every morning | ORAL | Status: DC

## 2023-05-16 MED ORDER — TRAMADOL HCL 50 MG PO TABS: 50.0000 mg | ORAL_TABLET | Freq: Four times a day (QID) | ORAL | Status: DC | PRN

## 2023-05-16 MED ORDER — METHOCARBAMOL 500 MG PO TABS
500.0000 mg | ORAL_TABLET | Freq: Three times a day (TID) | ORAL | Status: DC | PRN
  Administered 2023-05-16 – 2023-05-17 (×2): 500 mg via ORAL
  Filled 2023-05-16 (×2): qty 1

## 2023-05-16 MED ORDER — BUPROPION HCL ER (XL) 150 MG PO TB24
150.0000 mg | ORAL_TABLET | Freq: Every day | ORAL | Status: DC
  Administered 2023-05-16 – 2023-05-18 (×3): 150 mg via ORAL
  Filled 2023-05-16 (×3): qty 1

## 2023-05-16 MED ORDER — ESCITALOPRAM OXALATE 20 MG PO TABS: 20.0000 mg | ORAL_TABLET | Freq: Every morning | ORAL | Status: DC

## 2023-05-16 MED ORDER — ESCITALOPRAM OXALATE 20 MG PO TABS
20.0000 mg | ORAL_TABLET | Freq: Every day | ORAL | Status: DC
  Administered 2023-05-16 – 2023-05-18 (×3): 20 mg via ORAL
  Filled 2023-05-16 (×3): qty 1

## 2023-05-16 MED ORDER — UMECLIDINIUM-VILANTEROL 62.5-25 MCG/ACT IN AEPB
1.0000 | INHALATION_SPRAY | Freq: Every day | RESPIRATORY_TRACT | Status: DC
  Administered 2023-05-16 – 2023-05-18 (×3): 1 via RESPIRATORY_TRACT
  Filled 2023-05-16: qty 14

## 2023-05-16 MED ORDER — CARMEX CLASSIC LIP BALM EX OINT
1.0000 | TOPICAL_OINTMENT | CUTANEOUS | Status: DC | PRN
  Administered 2023-05-16: 1 via TOPICAL
  Filled 2023-05-16: qty 10

## 2023-05-16 MED ORDER — TRAMADOL HCL 50 MG PO TABS
50.0000 mg | ORAL_TABLET | Freq: Two times a day (BID) | ORAL | Status: DC | PRN
  Administered 2023-05-17: 50 mg via ORAL
  Filled 2023-05-16: qty 1

## 2023-05-16 MED ORDER — HYDROXYCHLOROQUINE SULFATE 200 MG PO TABS
200.0000 mg | ORAL_TABLET | Freq: Two times a day (BID) | ORAL | Status: DC
Start: 2023-05-16 — End: 2023-05-18
  Administered 2023-05-16 – 2023-05-18 (×5): 200 mg via ORAL
  Filled 2023-05-16 (×5): qty 1

## 2023-05-16 MED ORDER — MECLIZINE HCL 25 MG PO TABS: 25.0000 mg | ORAL_TABLET | Freq: Three times a day (TID) | ORAL | Status: DC | PRN

## 2023-05-16 MED ORDER — HYDRALAZINE HCL 50 MG PO TABS
100.0000 mg | ORAL_TABLET | Freq: Three times a day (TID) | ORAL | Status: DC
  Administered 2023-05-16 – 2023-05-18 (×6): 100 mg via ORAL
  Filled 2023-05-16 (×6): qty 2

## 2023-05-16 MED ORDER — ONDANSETRON HCL 4 MG PO TABS
4.0000 mg | ORAL_TABLET | Freq: Three times a day (TID) | ORAL | Status: DC | PRN
  Administered 2023-05-16: 4 mg via ORAL
  Filled 2023-05-16: qty 1

## 2023-05-16 NOTE — Plan of Care (Signed)

## 2023-05-16 NOTE — Progress Notes (Signed)
 PROGRESS NOTE    Herbert Morrison  FMW:[id number]  DOB: 09-13-1945  DOA: 05/15/2023 PCP: Patient, No Pcp Per Outpatient Specialists:   Hospital course:  78 year old with SLE, RA, ulcerative colitis and multiple large colonic polyps requiring laparoscopic hemicolectomy 04/06/2023 complicated by rectal GI which stopped after discontinuing Eliquis.  Patient has been having ongoing diarrhea since hemicolectomy was admitted yesterday for metabolic encephalopathy and difficulty being aroused.  Workup in ED revealed AKI and hypotension.  Patient was treated with fluids with improvement in mental status and blood pressure.   Subjective:  Patient seen with attentive wife at bedside.  She notes that he was more alert until he got his Ativan  this morning.  She notes he is on chronic Ativan  at home twice daily.  She also notes that he was quite agitated and irritable this morning before he got the Ativan .  Notes that he is normally on Lexapro  and Wellbutrin  which she has not been getting here.  She also notes that he is only had 2 episodes of diarrhea since being admitted which is much improved for him.   Objective: Vitals:   05/16/23 0628 05/16/23 0821 05/16/23 1327 05/16/23 1417  BP: (!) 144/80 (!) 143/85 (!) 152/79   Pulse: 75 70 82   Resp: 18 18 18    Temp: 97.8 F (36.6 C) 98.4 F (36.9 C) 97.6 F (36.4 C)   TempSrc: Oral Oral Oral   SpO2: 97% 97% 98% 99%  Weight:      Height:        Intake/Output Summary (Last 24 hours) at 05/16/2023 1457 Last data filed at 05/16/2023 0000 Gross per 24 hour  Intake 1180 ml  Output --  Net 1180 ml   Filed Weights   05/15/23 1411  Weight: 85.7 kg     Exam:  General: Sleepy patient lying in bed in no distress with attentive wife at bedside Eyes: sclera anicteric, conjuctiva mild injection bilaterally CVS: S1-S2, regular  Respiratory:  decreased air entry bilaterally secondary to decreased inspiratory effort, rales at bases  GI: Mildly  distended, NABS, soft, NT  LE: Warm and well-perfused  Data Reviewed:  Basic Metabolic Panel: Recent Labs  Lab 05/15/23 1705 05/15/23 2300 05/16/23 0036  NA 138  --  137  K 5.3*  --  4.7  CL 119*  --  120*  CO2 12*  --  10*  GLUCOSE 90  --  112*  BUN 78*  --  78*  CREATININE 3.90* 3.48* 3.54*  CALCIUM  9.6  --  9.3    CBC: Recent Labs  Lab 05/15/23 1540 05/15/23 2300 05/16/23 0036  WBC 8.3 7.4 7.5  NEUTROABS 5.7  --   --   HGB 9.8* 9.0* 9.4*  HCT 32.6* 31.7* 32.1*  MCV 80.9 82.6 82.1  PLT 477* 410* 436*     Scheduled Meds:  [START ON 05/17/2023] buPROPion   300 mg Oral q AM   [START ON 05/17/2023] escitalopram   20 mg Oral q AM   heparin   5,000 Units Subcutaneous Q8H   hydrALAZINE   100 mg Oral TID   hydroxychloroquine   200 mg Oral BID   predniSONE   10 mg Oral Q breakfast   sodium bicarbonate   650 mg Oral QID   sodium chloride  flush  3 mL Intravenous Q12H   umeclidinium-vilanterol  1 puff Inhalation Daily   Continuous Infusions:   Assessment & Plan:  Diarrhea/high output s/p hemicolectomy Hyperchloremic nonanion gap metabolic acidosis AKI Metabolic encephalopathy--now resolved Patient clinically improved with  1 L NS bolus in the ED with improved mental status Creatinine modestly improved from 3.9 yesterday to 3.5 this morning Patient is on sodium bicarb 650 4 times daily x 4 doses He is presently eating and drinking okay, diarrhea seems to have decreased given only 2 episodes since admission Will need to follow and match I's and O's, if patient unable to orally hydrate will need to provide IVF Follow creatinine and electrolytes closely  HTN BP improved with hydration and decrease diarrhea Hydralazine  restarted, follow closely  Anxiety and depression Agitation Restart Lexapro  and Wellbutrin  per home doses Patient is normally on Ativan  2 mg twice daily, this has been decreased to 1 mg twice daily, will continue  H/O GIB Follow H&H closely Avoid  anticoagulation, rectal bleeding previously resolved with discontinuation of Eliquis  SLE/RA Continue prednisone  per home doses, no need for stress dose steroids at present   DVT prophylaxis: SCD Code Status: Full Family Communication: Wife at bedside throughout     Studies: CT ABDOMEN PELVIS WO CONTRAST Result Date: 05/15/2023 CLINICAL DATA:  Abdominal pain, fatigue, decreased appetite EXAM: CT ABDOMEN AND PELVIS WITHOUT CONTRAST TECHNIQUE: Multidetector CT imaging of the abdomen and pelvis was performed following the standard protocol without IV contrast. RADIATION DOSE REDUCTION: This exam was performed according to the departmental dose-optimization program which includes automated exposure control, adjustment of the mA and/or kV according to patient size and/or use of iterative reconstruction technique. COMPARISON:  04/27/2023 FINDINGS: Lower chest: No acute abnormality. Hepatobiliary: No focal hepatic abnormality. Gallbladder unremarkable. Pancreas: No focal abnormality or ductal dilatation. Spleen: No focal abnormality.  Normal size. Adrenals/Urinary Tract: No suspicious renal or adrenal lesion. Scattered cysts throughout the right kidney. No follow-up imaging recommended. No stones or hydronephrosis. Urinary bladder unremarkable. Stomach/Bowel: Postoperative changes from subtotal colectomy. Continued stranding in the left pelvis near the ileocolic anastomosis. This is similar to prior study and may be postoperative. No bowel obstruction. Vascular/Lymphatic: Aortic atherosclerosis. No evidence of aneurysm or adenopathy. Reproductive: Prostate enlargement Other: No free fluid or free air. Musculoskeletal: No acute bony abnormality. Prior left hip replacement. IMPRESSION: Prior subtotal colectomy. Continued stranding in the pelvis to the left of the suture line, similar to prior study and may be postoperative. No focal fluid collection. No bowel obstruction. Aortic atherosclerosis. Prostate  enlargement. Electronically Signed   By: Franky Crease M.D.   On: 05/15/2023 21:34   DG Chest 2 View Result Date: 05/15/2023 CLINICAL DATA:  Suspected sepsis.  Fatigue.  Decreased appetite. EXAM: CHEST - 2 VIEW COMPARISON:  None Available. FINDINGS: Bilateral lung fields are clear. Bilateral costophrenic angles are clear. Normal cardio-mediastinal silhouette. No acute osseous abnormalities. The soft tissues are within normal limits. IMPRESSION: No active cardiopulmonary disease. Electronically Signed   By: Ree Molt M.D.   On: 05/15/2023 16:11    Principal Problem:   AKI (acute kidney injury) (HCC) Active Problems:   Essential hypertension   Lupus (systemic lupus erythematosus) (HCC)   Acute metabolic encephalopathy   Diarrhea     Herbert Morrison, [facility]  If 7PM-7AM, please contact night-coverage [email or web address]   LOS: 1 day

## 2023-05-17 DIAGNOSIS — N179 Acute kidney failure, unspecified: Secondary | ICD-10-CM | POA: Diagnosis not present

## 2023-05-17 LAB — CBC
HCT: 27.2 % — ABNORMAL LOW (ref 39.0–52.0)
Hemoglobin: 8.2 g/dL — ABNORMAL LOW (ref 13.0–17.0)
MCH: 23.6 pg — ABNORMAL LOW (ref 26.0–34.0)
MCHC: 30.1 g/dL (ref 30.0–36.0)
MCV: 78.4 fL — ABNORMAL LOW (ref 80.0–100.0)
Platelets: 387 10*3/uL (ref 150–400)
RBC: 3.47 MIL/uL — ABNORMAL LOW (ref 4.22–5.81)
RDW: 18.2 % — ABNORMAL HIGH (ref 11.5–15.5)
WBC: 7 10*3/uL (ref 4.0–10.5)
nRBC: 0 % (ref 0.0–0.2)

## 2023-05-17 LAB — BASIC METABOLIC PANEL
Anion gap: 6 (ref 5–15)
BUN: 52 mg/dL — ABNORMAL HIGH (ref 8–23)
CO2: 15 mmol/L — ABNORMAL LOW (ref 22–32)
Calcium: 9.4 mg/dL (ref 8.9–10.3)
Chloride: 117 mmol/L — ABNORMAL HIGH (ref 98–111)
Creatinine, Ser: 2.11 mg/dL — ABNORMAL HIGH (ref 0.61–1.24)
GFR, Estimated: 32 mL/min — ABNORMAL LOW (ref >60)
Glucose, Bld: 90 mg/dL (ref 70–99)
Potassium: 4 mmol/L (ref 3.5–5.1)
Sodium: 138 mmol/L (ref 135–145)

## 2023-05-17 LAB — GASTROINTESTINAL PANEL BY PCR, STOOL (REPLACES STOOL CULTURE)

## 2023-05-17 MED ORDER — LOPERAMIDE HCL 2 MG PO CAPS
4.0000 mg | ORAL_CAPSULE | ORAL | Status: DC | PRN
  Administered 2023-05-17: 4 mg via ORAL
  Filled 2023-05-17: qty 2

## 2023-05-17 NOTE — Progress Notes (Signed)
 PROGRESS NOTE    Herbert Morrison  FMW:[id number]  DOB: 04-18-1946  DOA: 05/15/2023 PCP: Patient, No Pcp Per Outpatient Specialists:   Hospital course:  78 year old with SLE, RA, ulcerative colitis and multiple large colonic polyps requiring laparoscopic hemicolectomy 04/06/2023 complicated by rectal GI which stopped after discontinuing Eliquis.  Patient has been having ongoing diarrhea since hemicolectomy was admitted yesterday for metabolic encephalopathy and difficulty being aroused.  Workup in ED revealed AKI and hypotension.  Patient was treated with fluids with improvement in mental status and blood pressure.   Subjective:  Patient seen with attentive wife at bedside.  They both feel that he is improving.  Agitation and irritability much improved since restarting his Lexapro  and Wellbutrin .  However his diarrhea has increased in frequency again, back up to 3 times last night.   Objective: Vitals:   05/17/23 0554 05/17/23 1017 05/17/23 1019 05/17/23 1310  BP: (!) 156/75   (!) 143/78  Pulse: 66   85  Resp: 18   15  Temp: 98.1 F (36.7 C)   98.2 F (36.8 C)  TempSrc: Oral   Oral  SpO2: 98% 93% 93% 95%  Weight:      Height:        Intake/Output Summary (Last 24 hours) at 05/17/2023 1704 Last data filed at 05/16/2023 2214 Gross per 24 hour  Intake 153 ml  Output --  Net 153 ml   Filed Weights   05/15/23 1411  Weight: 85.7 kg     Exam:  General: Sleepy patient lying in bed in no distress with attentive wife at bedside Eyes: sclera anicteric, conjuctiva mild injection bilaterally CVS: S1-S2, regular  Respiratory:  decreased air entry bilaterally secondary to decreased inspiratory effort, rales at bases  GI: Mildly distended, NABS, soft, NT  LE: Warm and well-perfused  Data Reviewed:  Basic Metabolic Panel: Recent Labs  Lab 05/15/23 1705 05/15/23 2300 05/16/23 0036 05/17/23 0711  NA 138  --  137 138  K 5.3*  --  4.7 4.0  CL 119*  --  120* 117*  CO2 12*   --  10* 15*  GLUCOSE 90  --  112* 90  BUN 78*  --  78* 52*  CREATININE 3.90* 3.48* 3.54* 2.11*  CALCIUM  9.6  --  9.3 9.4    CBC: Recent Labs  Lab 05/15/23 1540 05/15/23 2300 05/16/23 0036 05/17/23 0711  WBC 8.3 7.4 7.5 7.0  NEUTROABS 5.7  --   --   --   HGB 9.8* 9.0* 9.4* 8.2*  HCT 32.6* 31.7* 32.1* 27.2*  MCV 80.9 82.6 82.1 78.4*  PLT 477* 410* 436* 387     Scheduled Meds:  buPROPion   150 mg Oral Daily   escitalopram   20 mg Oral Daily   heparin   5,000 Units Subcutaneous Q8H   hydrALAZINE   100 mg Oral TID   hydroxychloroquine   200 mg Oral BID   predniSONE   10 mg Oral Q breakfast   sodium chloride  flush  3 mL Intravenous Q12H   umeclidinium-vilanterol  1 puff Inhalation Daily   Continuous Infusions:   Assessment & Plan:  Diarrhea/high output s/p hemicolectomy Hyperchloremic nonanion gap metabolic acidosis AKI Metabolic encephalopathy--now resolved Creatinine continues to improve, patient is off of IV fluids, is orally hydrating However diarrhea has increased again in frequency, restart Imodium  Anticipate discharge home tomorrow if creatinine continues to improve  HTN BP improved with hydration and decrease diarrhea Hydralazine  restarted, follow closely  Anxiety and depression Agitation Restart Lexapro  and Wellbutrin   per home doses Patient is normally on Ativan  2 mg twice daily, this has been decreased to 1 mg twice daily, will continue  H/O GIB Follow H&H closely Avoid anticoagulation, rectal bleeding previously resolved with discontinuation of Eliquis  SLE/RA Continue prednisone  per home doses, no need for stress dose steroids at present   DVT prophylaxis: SCD Code Status: Full Family Communication: Wife at bedside throughout     Studies: CT ABDOMEN PELVIS WO CONTRAST Result Date: 05/15/2023 CLINICAL DATA:  Abdominal pain, fatigue, decreased appetite EXAM: CT ABDOMEN AND PELVIS WITHOUT CONTRAST TECHNIQUE: Multidetector CT imaging of the abdomen  and pelvis was performed following the standard protocol without IV contrast. RADIATION DOSE REDUCTION: This exam was performed according to the departmental dose-optimization program which includes automated exposure control, adjustment of the mA and/or kV according to patient size and/or use of iterative reconstruction technique. COMPARISON:  04/27/2023 FINDINGS: Lower chest: No acute abnormality. Hepatobiliary: No focal hepatic abnormality. Gallbladder unremarkable. Pancreas: No focal abnormality or ductal dilatation. Spleen: No focal abnormality.  Normal size. Adrenals/Urinary Tract: No suspicious renal or adrenal lesion. Scattered cysts throughout the right kidney. No follow-up imaging recommended. No stones or hydronephrosis. Urinary bladder unremarkable. Stomach/Bowel: Postoperative changes from subtotal colectomy. Continued stranding in the left pelvis near the ileocolic anastomosis. This is similar to prior study and may be postoperative. No bowel obstruction. Vascular/Lymphatic: Aortic atherosclerosis. No evidence of aneurysm or adenopathy. Reproductive: Prostate enlargement Other: No free fluid or free air. Musculoskeletal: No acute bony abnormality. Prior left hip replacement. IMPRESSION: Prior subtotal colectomy. Continued stranding in the pelvis to the left of the suture line, similar to prior study and may be postoperative. No focal fluid collection. No bowel obstruction. Aortic atherosclerosis. Prostate enlargement. Electronically Signed   By: Franky Crease M.D.   On: 05/15/2023 21:34    Principal Problem:   AKI (acute kidney injury) (HCC) Active Problems:   Essential hypertension   Lupus (systemic lupus erythematosus) (HCC)   Acute metabolic encephalopathy   Diarrhea     Hortencia Martire Vangie Pike, [facility]  If 7PM-7AM, please contact night-coverage [email or web address]   LOS: 2 days

## 2023-05-17 NOTE — Plan of Care (Signed)

## 2023-05-18 DIAGNOSIS — I10 Essential (primary) hypertension: Secondary | ICD-10-CM | POA: Diagnosis not present

## 2023-05-18 DIAGNOSIS — G9341 Metabolic encephalopathy: Secondary | ICD-10-CM | POA: Diagnosis not present

## 2023-05-18 DIAGNOSIS — N179 Acute kidney failure, unspecified: Secondary | ICD-10-CM | POA: Diagnosis not present

## 2023-05-18 LAB — BASIC METABOLIC PANEL
Anion gap: 5 (ref 5–15)
BUN: 42 mg/dL — ABNORMAL HIGH (ref 8–23)
CO2: 16 mmol/L — ABNORMAL LOW (ref 22–32)
Calcium: 9.4 mg/dL (ref 8.9–10.3)
Chloride: 117 mmol/L — ABNORMAL HIGH (ref 98–111)
Creatinine, Ser: 1.9 mg/dL — ABNORMAL HIGH (ref 0.61–1.24)
GFR, Estimated: 36 mL/min — ABNORMAL LOW (ref >60)
Glucose, Bld: 88 mg/dL (ref 70–99)
Potassium: 4.4 mmol/L (ref 3.5–5.1)
Sodium: 138 mmol/L (ref 135–145)

## 2023-05-18 LAB — CBC
HCT: 26.1 % — ABNORMAL LOW (ref 39.0–52.0)
Hemoglobin: 7.7 g/dL — ABNORMAL LOW (ref 13.0–17.0)
MCH: 23.6 pg — ABNORMAL LOW (ref 26.0–34.0)
MCHC: 29.5 g/dL — ABNORMAL LOW (ref 30.0–36.0)
MCV: 80.1 fL (ref 80.0–100.0)
Platelets: 365 10*3/uL (ref 150–400)
RBC: 3.26 MIL/uL — ABNORMAL LOW (ref 4.22–5.81)
RDW: 17.9 % — ABNORMAL HIGH (ref 11.5–15.5)
WBC: 7 10*3/uL (ref 4.0–10.5)
nRBC: 0 % (ref 0.0–0.2)

## 2023-05-18 MED ORDER — SODIUM BICARBONATE 650 MG PO TABS: 650.0000 mg | ORAL_TABLET | Freq: Two times a day (BID) | ORAL | 0 refills | Status: AC

## 2023-05-18 MED ORDER — AMLODIPINE BESYLATE 5 MG PO TABS
5.0000 mg | ORAL_TABLET | Freq: Every day | ORAL | 11 refills | Status: DC
Start: 2023-05-18 — End: 2023-09-29

## 2023-05-18 MED ORDER — LOPERAMIDE HCL 2 MG PO CAPS: 4.0000 mg | ORAL_CAPSULE | ORAL | 0 refills | Status: DC | PRN

## 2023-05-18 MED ORDER — LACTATED RINGERS IV SOLN: INTRAVENOUS | Status: DC

## 2023-05-18 MED ORDER — PREDNISONE 5 MG PO TABS: 10.0000 mg | ORAL_TABLET | Freq: Every morning | ORAL | 1 refills | Status: DC

## 2023-05-18 MED ORDER — SODIUM BICARBONATE 650 MG PO TABS: 650.0000 mg | ORAL_TABLET | Freq: Two times a day (BID) | ORAL | Status: DC

## 2023-05-18 NOTE — Plan of Care (Signed)

## 2023-05-18 NOTE — TOC Transition Note (Signed)
 Transition of Care [facility]) - Discharge Note   Patient Details  Name: Herbert Morrison MRN: [id number] Date of Birth: 01-07-1946  [facility] CM/SW Contact:  Toy LITTIE Agar, RN Phone Number:[phone]  05/18/2023, 12:18 PM   Clinical Narrative:    Patient with discharge orders. CM at bedside, is patient states that he does have PCP but is currently establishing a new PCP with [facility]. Currently there are no TOC needs. Patient states that he has no needs. TOC will sign off.    Final next level of care: Home/Self Care Barriers to Discharge: No Barriers Identified   Patient Goals and CMS Choice Patient states their goals for this hospitalization and ongoing recovery are:: Ready to go home   Choice offered to / list presented to : NA [place] ownership interest in [facility].provided to::  (n/a)    Discharge Placement                       Discharge Plan and Services Additional resources added to the After Visit Summary for                  DME Arranged: N/A DME Agency: NA       HH Arranged: NA HH Agency: NA        Social Drivers of Health (SDOH) Interventions SDOH Screenings   Food Insecurity: No Food Insecurity (05/16/2023)  Housing: Unknown (05/16/2023)  Transportation Needs: No Transportation Needs (05/16/2023)  Utilities: Not At Risk (05/16/2023)  Financial Resource Strain: Low Risk  (04/28/2023)   Received from [facility]  Physical Activity: Sufficiently Active (04/28/2023)   Received from [facility]  Recent Concern: Physical Activity - Insufficiently Active (04/06/2023)   Received from [facility]  Social Connections: Moderately Integrated (05/16/2023)  Stress: No Stress Concern Present (04/06/2023)   Received from [facility]  Tobacco Use: Medium Risk (05/15/2023)  Health Literacy: Low Risk  (04/28/2023)   Received from [facility]     Readmission Risk Interventions    05/11/2022   11:50 AM  Readmission  Risk Prevention Plan  Post Dischage Appt Complete  Medication Screening Complete  Transportation Screening Complete

## 2023-05-18 NOTE — Telephone Encounter (Signed)
 Pt is currently in the hospital.

## 2023-05-18 NOTE — Discharge Summary (Signed)
 Physician Discharge Summary   Patient: Herbert Morrison MRN: [id number] DOB: 02-Nov-1945  Admit date:     05/15/2023  Discharge date: 05/18/23  Discharge Physician: Sabas GORMAN Brod   PCP: Patient, No Pcp Per   Recommendations at discharge:   Follow-up PCP in 1 week Check CBC, BMP in 1 week Take sodium bicarb tablets 650 mg p.o. twice daily for 5 days  Discharge Diagnoses: Principal Problem:   AKI (acute kidney injury) (HCC) Active Problems:   Essential hypertension   Lupus (systemic lupus erythematosus) (HCC)   Acute metabolic encephalopathy   Diarrhea  Resolved Problems:   * No resolved hospital problems. [facility] Course: 78 year old with SLE, RA, ulcerative colitis and multiple large colonic polyps requiring laparoscopic hemicolectomy 04/06/2023 complicated by rectal GI which stopped after discontinuing Eliquis.  Patient has been having ongoing diarrhea since hemicolectomy was admitted yesterday for metabolic encephalopathy and difficulty being aroused.  Workup in ED revealed AKI and hypotension.  Patient was treated with fluids with improvement in mental status and blood pressure.    Assessment and Plan:   Diarrhea/high output s/p hemicolectomy Hyperchloremic nonanion gap metabolic acidosis AKI Metabolic encephalopathy--now resolved Creatinine continues to improve, today creatinine is 1.90, close to baseline Continue Imodium  as needed Will start sodium bicarb tablets of 50 mg p.o. twice daily for 5 days, CO2 of 16 today   HTN BP improved with hydration and decrease diarrhea Hydralazine  restarted -Will restart amlodipine  at low-dose of 5 mg daily -Will discontinue valsartan    Anxiety and depression Agitation Continue home medications   H/O GIB Follow H&H closely Avoid anticoagulation, rectal bleeding previously resolved with discontinuation of Eliquis -Hemoglobin is 7.7 today, recheck CBC as outpatient in 1 week   SLE/RA Continue prednisone  per home dose          Consultants:  Procedures performed:  Disposition: Home Diet recommendation:  Discharge Diet Orders (From admission, onward)     Start     Ordered   05/18/23 0000  Diet - low sodium heart healthy        05/18/23 1153           Regular diet DISCHARGE MEDICATION: Allergies as of 05/18/2023       Reactions   Remicade [infliximab] Anaphylaxis   Erythromycin Nausea Only   Grass Pollen(k-o-r-t-swt Vern) Other (See Comments)   Seasonal allergies   Pollen Extract Other (See Comments)   Sulfasalazine Nausea And Vomiting        Medication List     STOP taking these medications    amLODipine -valsartan  10-320 MG tablet Commonly known as: EXFORGE        TAKE these medications    amLODipine  5 MG tablet Commonly known as: NORVASC  Take 1 tablet (5 mg total) by mouth daily.   Anoro Ellipta  62.5-25 MCG/ACT Aepb Generic drug: umeclidinium-vilanterol Inhale 1 puff into the lungs daily.   buPROPion  300 MG 24 hr tablet Commonly known as: WELLBUTRIN  XL Take 300 mg by mouth in the morning.   cyclobenzaprine  10 MG tablet Commonly known as: FLEXERIL  Take 10 mg by mouth 3 (three) times daily as needed for muscle spasms.   escitalopram  20 MG tablet Commonly known as: LEXAPRO  Take 20 mg by mouth in the morning.   fluticasone  50 MCG/ACT nasal spray Commonly known as: FLONASE  Place 1 spray into both nostrils daily. What changed:  when to take this reasons to take this   hydrALAZINE  100 MG tablet Commonly known as: APRESOLINE  Take 100 mg by mouth 3 (  three) times daily.   hydroxychloroquine  200 MG tablet Commonly known as: PLAQUENIL  Take 200 mg by mouth 2 (two) times daily.   ketoconazole  2 % shampoo Commonly known as: NIZORAL  Apply 1 Application topically every other day. Every other shower   levocetirizine 5 MG tablet Commonly known as: XYZAL Take 10 mg by mouth daily.   loperamide  2 MG capsule Commonly known as: IMODIUM  Take 2 capsules (4 mg total)  by mouth as needed for diarrhea or loose stools.   LORazepam  2 MG tablet Commonly known as: ATIVAN  Take 2 mg by mouth in the morning and at bedtime.   meclizine  25 MG tablet Commonly known as: ANTIVERT  Take 25 mg by mouth 3 (three) times daily as needed for dizziness or nausea.   methocarbamol  500 MG tablet Commonly known as: ROBAXIN  Take 500 mg by mouth every 8 (eight) hours as needed for muscle spasms.   ondansetron  4 MG tablet Commonly known as: ZOFRAN  Take 4 mg by mouth every 6 (six) hours as needed.   predniSONE  5 MG tablet Commonly known as: DELTASONE  Take 2 tablets (10 mg total) by mouth in the morning.   sodium bicarbonate  650 MG tablet Take 1 tablet (650 mg total) by mouth 2 (two) times daily for 5 days.   traMADol  50 MG tablet Commonly known as: ULTRAM  Take 50 mg by mouth every 6 (six) hours as needed for moderate pain (pain score 4-6) or severe pain (pain score 7-10).        Discharge Exam: Filed Weights   05/15/23 1411 05/18/23 0713  Weight: 85.7 kg 85.7 kg   General-appears in no acute distress Heart-S1-S2, regular, no murmur auscultated Lungs-clear to auscultation bilaterally, no wheezing or crackles auscultated Abdomen-soft, nontender, no organomegaly Extremities-no edema in the lower extremities Neuro-alert, oriented x3, no focal deficit noted  Condition at discharge: good  The results of significant diagnostics from this hospitalization (including imaging, microbiology, ancillary and laboratory) are listed below for reference.   Imaging Studies: CT ABDOMEN PELVIS WO CONTRAST Result Date: 05/15/2023 CLINICAL DATA:  Abdominal pain, fatigue, decreased appetite EXAM: CT ABDOMEN AND PELVIS WITHOUT CONTRAST TECHNIQUE: Multidetector CT imaging of the abdomen and pelvis was performed following the standard protocol without IV contrast. RADIATION DOSE REDUCTION: This exam was performed according to the departmental dose-optimization program which includes  automated exposure control, adjustment of the mA and/or kV according to patient size and/or use of iterative reconstruction technique. COMPARISON:  04/27/2023 FINDINGS: Lower chest: No acute abnormality. Hepatobiliary: No focal hepatic abnormality. Gallbladder unremarkable. Pancreas: No focal abnormality or ductal dilatation. Spleen: No focal abnormality.  Normal size. Adrenals/Urinary Tract: No suspicious renal or adrenal lesion. Scattered cysts throughout the right kidney. No follow-up imaging recommended. No stones or hydronephrosis. Urinary bladder unremarkable. Stomach/Bowel: Postoperative changes from subtotal colectomy. Continued stranding in the left pelvis near the ileocolic anastomosis. This is similar to prior study and may be postoperative. No bowel obstruction. Vascular/Lymphatic: Aortic atherosclerosis. No evidence of aneurysm or adenopathy. Reproductive: Prostate enlargement Other: No free fluid or free air. Musculoskeletal: No acute bony abnormality. Prior left hip replacement. IMPRESSION: Prior subtotal colectomy. Continued stranding in the pelvis to the left of the suture line, similar to prior study and may be postoperative. No focal fluid collection. No bowel obstruction. Aortic atherosclerosis. Prostate enlargement. Electronically Signed   By: Franky Crease M.D.   On: 05/15/2023 21:34   DG Chest 2 View Result Date: 05/15/2023 CLINICAL DATA:  Suspected sepsis.  Fatigue.  Decreased appetite. EXAM: CHEST -  2 VIEW COMPARISON:  None Available. FINDINGS: Bilateral lung fields are clear. Bilateral costophrenic angles are clear. Normal cardio-mediastinal silhouette. No acute osseous abnormalities. The soft tissues are within normal limits. IMPRESSION: No active cardiopulmonary disease. Electronically Signed   By: Ree Molt M.D.   On: 05/15/2023 16:11   CT ANGIO GI BLEED Result Date: 04/27/2023 CLINICAL DATA:  Rectal bleeding.  Weakness.  Recent colectomy. EXAM: CTA ABDOMEN AND PELVIS WITHOUT  AND WITH CONTRAST TECHNIQUE: Multidetector CT imaging of the abdomen and pelvis was performed using the standard protocol during bolus administration of intravenous contrast. Multiplanar reconstructed images and MIPs were obtained and reviewed to evaluate the vascular anatomy. RADIATION DOSE REDUCTION: This exam was performed according to the departmental dose-optimization program which includes automated exposure control, adjustment of the mA and/or kV according to patient size and/or use of iterative reconstruction technique. CONTRAST:  80mL OMNIPAQUE  IOHEXOL  350 MG/ML SOLN COMPARISON:  None Available. FINDINGS: VASCULAR Aorta: Normal caliber aorta without aneurysm, dissection, vasculitis or significant stenosis. There is moderate calcified atherosclerotic disease throughout the aorta. Celiac: Patent without evidence of aneurysm, dissection, vasculitis or significant stenosis. SMA: Patent without evidence of aneurysm, dissection, vasculitis or significant stenosis. Renals: Both renal arteries are patent without evidence of aneurysm, dissection, vasculitis, fibromuscular dysplasia or significant stenosis. Accessory renal arteries are seen bilaterally. IMA: Small caliber, but grossly patent. Inflow: Patent without evidence of aneurysm, dissection, vasculitis or significant stenosis. Proximal Outflow: Bilateral common femoral and visualized portions of the superficial and profunda femoral arteries are patent without evidence of aneurysm, dissection, vasculitis or significant stenosis. Veins: Within normal limits. Review of the MIP images confirms the above findings. NON-VASCULAR Lower chest: No acute abnormality. Hepatobiliary: No focal liver abnormality is seen. No gallstones, gallbladder wall thickening, or biliary dilatation. Pancreas: Unremarkable. No pancreatic ductal dilatation or surrounding inflammatory changes. Spleen: Normal in size without focal abnormality. Adrenals/Urinary Tract: Bladder is decompressed  and not well evaluated. There is no hydronephrosis or perinephric fluid. Multiple right renal cysts are present measuring up 2 3.6 cm. Adrenal glands are within normal limits. Stomach/Bowel: Patient is status post colectomy with ileocolic anastomosis. There is a tiny focus of extraluminal gas near the anastomosis which is likely postoperative. There is no fluid collection identified. There is mild surrounding inflammatory stranding in this region, likely postoperative. There some mildly dilated mid small bowel loops without transition point. The stomach is nondilated. There is a surgical clip in the rectum. No active gastrointestinal bleeding identified. Lymphatic: No enlarged lymph nodes are seen. Reproductive: Prostate is unremarkable. Other: There some scarring in the lower anterior abdominal wall. There is no ascites or focal abdominal wall hernia. Musculoskeletal: Mild chronic appearing compression deformity is seen along the superior endplate of L1. Left hip arthroplasty is present. IMPRESSION: 1. No evidence for active gastrointestinal bleeding. 2. Aortic atherosclerosis. 3. Mildly dilated mid small bowel loops without transition point, likely ileus. 4. Status post colectomy. There is stranding in the small amount of extraluminal air near the anastomosis compatible with recent surgery. No fluid collection identified. 5. Right renal cysts.  Follow-up imaging no recommended. Aortic Atherosclerosis (ICD10-I70.0). Electronically Signed   By: Greig Pique M.D.   On: 04/27/2023 03:21   CT HEAD WO CONTRAST ( ) Result Date: 04/26/2023 CLINICAL DATA:  Delirium EXAM: CT HEAD WITHOUT CONTRAST TECHNIQUE: Contiguous axial images were obtained from the base of the skull through the vertex without intravenous contrast. RADIATION DOSE REDUCTION: This exam was performed according to the departmental dose-optimization program which includes automated  exposure control, adjustment of the mA and/or kV according to patient  size and/or use of iterative reconstruction technique. COMPARISON:  None Available. FINDINGS: Brain: Normal anatomic configuration. Parenchymal volume loss is commensurate with the patient's age. Mild periventricular white matter changes are present likely reflecting the sequela of small vessel ischemia. No abnormal intra or extra-axial mass lesion or fluid collection. No abnormal mass effect or midline shift. No evidence of acute intracranial hemorrhage or infarct. Ventricular size is normal. Cerebellum unremarkable. Vascular: No asymmetric hyperdense vasculature at the skull base. Skull: Intact Sinuses/Orbits: Air-fluid level noted within the visualized right maxillary sinus. Remaining paranasal sinuses are clear. Orbits are unremarkable. Other: Mastoid air cells and middle ear cavities are clear. IMPRESSION: 1. No acute intracranial abnormality. No calvarial fracture. 2. Mild senescent change. 3. Air-fluid level within the visualized right maxillary sinus. Correlate clinically for acute sinusitis. Electronically Signed   By: Dorethia Molt M.D.   On: 04/26/2023 23:12    Microbiology: Results for orders placed or performed during the hospital encounter of 05/15/23  Resp panel by RT-PCR (RSV, Flu A&B, Covid) Anterior Nasal Swab     Status: None   Collection Time: 05/15/23  3:15 PM   Specimen: Anterior Nasal Swab  Result Value Ref Range Status   SARS Coronavirus 2 by RT PCR NEGATIVE NEGATIVE Final    Comment: (NOTE) SARS-CoV-2 target nucleic acids are NOT DETECTED.  The SARS-CoV-2 RNA is generally detectable in upper respiratory specimens during the acute phase of infection. The lowest concentration of SARS-CoV-2 viral copies this assay can detect is 138 copies/mL. A negative result does not preclude SARS-Cov-2 infection and should not be used as the sole basis for treatment or other patient management decisions. A negative result may occur with  improper specimen collection/handling, submission  of specimen other than nasopharyngeal swab, presence of viral mutation(s) within the areas targeted by this assay, and inadequate number of viral copies(<138 copies/mL). A negative result must be combined with clinical observations, patient history, and epidemiological information. The expected result is Negative.  Fact Sheet for Patients:  bloggercourse.com  Fact Sheet for Healthcare Providers:  seriousbroker.it  This test is no t yet approved or cleared by the [place]  FDA and  has been authorized for detection and/or diagnosis of SARS-CoV-2 by FDA under an Emergency Use Authorization (EUA). This EUA will remain  in effect (meaning this test can be used) for the duration of the COVID-19 declaration under Section 564(b)(1) of the Act, 21 U.S.C.section 360bbb-3(b)(1), unless the authorization is terminated  or revoked sooner.       Influenza A by PCR NEGATIVE NEGATIVE Final   Influenza B by PCR NEGATIVE NEGATIVE Final    Comment: (NOTE) The Xpert Xpress SARS-CoV-2/FLU/RSV plus assay is intended as an aid in the diagnosis of influenza from Nasopharyngeal swab specimens and should not be used as a sole basis for treatment. Nasal washings and aspirates are unacceptable for Xpert Xpress SARS-CoV-2/FLU/RSV testing.  Fact Sheet for Patients: bloggercourse.com  Fact Sheet for Healthcare Providers: seriousbroker.it  This test is not yet approved or cleared by the [place]  FDA and has been authorized for detection and/or diagnosis of SARS-CoV-2 by FDA under an Emergency Use Authorization (EUA). This EUA will remain in effect (meaning this test can be used) for the duration of the COVID-19 declaration under Section 564(b)(1) of the Act, 21 U.S.C. section 360bbb-3(b)(1), unless the authorization is terminated or revoked.     Resp Syncytial Virus by PCR NEGATIVE NEGATIVE  Final  Comment: (NOTE) Fact Sheet for Patients: bloggercourse.com  Fact Sheet for Healthcare Providers: seriousbroker.it  This test is not yet approved or cleared by the [place]  FDA and has been authorized for detection and/or diagnosis of SARS-CoV-2 by FDA under an Emergency Use Authorization (EUA). This EUA will remain in effect (meaning this test can be used) for the duration of the COVID-19 declaration under Section 564(b)(1) of the Act, 21 U.S.C. section 360bbb-3(b)(1), unless the authorization is terminated or revoked.  Performed at [facility], [address], [place], [place] [address]   Culture, blood (Routine x 2)     Status: None (Preliminary result)   Collection Time: 05/15/23  3:37 PM   Specimen: BLOOD  Result Value Ref Range Status   Specimen Description   Final    BLOOD RIGHT ANTECUBITAL Performed at [facility], [address], [place], [place] [address]    Special Requests   Final    BOTTLES DRAWN AEROBIC ONLY Blood Culture adequate volume Performed at [facility], [address], [place], [place] [address]    Culture   Final    NO GROWTH 3 DAYS Performed at [facility], [address], [place], [place] [address]    Report Status PENDING  Incomplete  Gastrointestinal Panel by PCR , Stool     Status: None   Collection Time: 05/15/23  9:48 PM   Specimen: STOOL  Result Value Ref Range Status   Campylobacter species NOT DETECTED NOT DETECTED Final   Plesimonas shigelloides NOT DETECTED NOT DETECTED Final   Salmonella species NOT DETECTED NOT DETECTED Final   Yersinia enterocolitica NOT DETECTED NOT DETECTED Final   Vibrio species NOT DETECTED NOT DETECTED Final   Vibrio cholerae NOT DETECTED NOT DETECTED Final   Enteroaggregative E coli (EAEC) NOT DETECTED NOT DETECTED Final   Enteropathogenic E coli (EPEC) NOT DETECTED NOT DETECTED  Final   Enterotoxigenic E coli (ETEC) NOT DETECTED NOT DETECTED Final   Shiga like toxin producing E coli (STEC) NOT DETECTED NOT DETECTED Final   Shigella/Enteroinvasive E coli (EIEC) NOT DETECTED NOT DETECTED Final   Cryptosporidium NOT DETECTED NOT DETECTED Final   Cyclospora cayetanensis NOT DETECTED NOT DETECTED Final   Entamoeba histolytica NOT DETECTED NOT DETECTED Final   Giardia lamblia NOT DETECTED NOT DETECTED Final   Adenovirus F40/41 NOT DETECTED NOT DETECTED Final   Astrovirus NOT DETECTED NOT DETECTED Final   Norovirus GI/GII NOT DETECTED NOT DETECTED Final   Rotavirus A NOT DETECTED NOT DETECTED Final   Sapovirus (I, II, IV, and V) NOT DETECTED NOT DETECTED Final    Comment: Performed at [facility], [address] [place], [place] [address]  C Difficile Quick Screen w PCR reflex     Status: None   Collection Time: 05/15/23  9:48 PM   Specimen: STOOL  Result Value Ref Range Status   C Diff antigen NEGATIVE NEGATIVE Final   C Diff toxin NEGATIVE NEGATIVE Final   C Diff interpretation No C. difficile detected.  Final    Comment: Performed at [facility], [address], [place], [place] [address]  Culture, blood (Routine x 2)     Status: None (Preliminary result)   Collection Time: 05/16/23 12:36 AM   Specimen: BLOOD  Result Value Ref Range Status   Specimen Description   Final    BLOOD BLOOD LEFT ARM AEROBIC BOTTLE ONLY Performed at [facility], [address] [place],  [place] [address]    Special Requests   Final    BOTTLES DRAWN AEROBIC ONLY Blood Culture results may not be optimal due to an inadequate volume of blood received in culture bottles Performed at [facility], [address] [place], [place] [address]    Culture   Final    NO GROWTH 2 DAYS Performed at [facility], [address], [place], [place] [address]    Report Status PENDING  Incomplete    Labs: CBC: Recent  Labs  Lab 05/15/23 1540 05/15/23 2300 05/16/23 0036 05/17/23 0711 05/18/23 0543  WBC 8.3 7.4 7.5 7.0 7.0  NEUTROABS 5.7  --   --   --   --   HGB 9.8* 9.0* 9.4* 8.2* 7.7*  HCT 32.6* 31.7* 32.1* 27.2* 26.1*  MCV 80.9 82.6 82.1 78.4* 80.1  PLT 477* 410* 436* 387 365   Basic Metabolic Panel: Recent Labs  Lab 05/15/23 1705 05/15/23 2300 05/16/23 0036 05/17/23 0711 05/18/23 0543  NA 138  --  137 138 138  K 5.3*  --  4.7 4.0 4.4  CL 119*  --  120* 117* 117*  CO2 12*  --  10* 15* 16*  GLUCOSE 90  --  112* 90 88  BUN 78*  --  78* 52* 42*  CREATININE 3.90* 3.48* 3.54* 2.11* 1.90*  CALCIUM  9.6  --  9.3 9.4 9.4   Liver Function Tests: Recent Labs  Lab 05/15/23 1705  AST 20  ALT 21  ALKPHOS 63  BILITOT 0.5  PROT 6.7  ALBUMIN  3.6   CBG: No results for input(s): GLUCAP in the last 168 hours.  Discharge time spent: greater than 30 minutes.  Signed: Sabas GORMAN Brod, MD [facility] 05/18/2023

## 2023-05-18 NOTE — Progress Notes (Signed)
   05/18/23 1204  TOC Brief Assessment  Insurance and Status Reviewed  Patient has primary care physician Yes (Patient states that he is in the middle of changing his PCP due to issues with office staff. Planning to get PCP with [facility].)  Home environment has been reviewed Yes (from home with spouse)  Prior level of function: Independent  Prior/Current Home Services No current home services  Social Drivers of Health Review SDOH reviewed no interventions necessary  Readmission risk has been reviewed Yes  Transition of care needs no transition of care needs at this time

## 2023-05-20 LAB — CULTURE, BLOOD (ROUTINE X 2)
Culture: NO GROWTH
Special Requests: ADEQUATE

## 2023-05-21 LAB — CULTURE, BLOOD (ROUTINE X 2): Culture: NO GROWTH

## 2023-05-26 LAB — LAB REPORT - SCANNED: EGFR: 30

## 2023-06-11 ENCOUNTER — Telehealth: Payer: Self-pay | Admitting: Gastroenterology

## 2023-06-11 NOTE — Telephone Encounter (Signed)
Pts wife states pt is having isssues with diarrhea and is taking 6 Imodium daily. States he has to get up at night, shower and change the bed at times. He is wearing depends for men. She states the surgeon told her if he was still having problems he should follow-up with Dr. Tomasa Rand. Pt scheduled to see Dr. Tomasa Rand 06/18/23 at 10:30am.   Wife also would like to discuss a new PCP for pt. She would like Dr. Tomasa Rand to recommend a physician perhaps with [place] [place] [facility]. They are not happy with PCP in [place]. Let her know she could discuss this at the visit on 06/18/23 also. Dr. Tomasa Rand notified.

## 2023-06-11 NOTE — Telephone Encounter (Signed)
Patient's wife called requesting to speak with a nurse to schedule a sooner appointment with Dr. Tomasa Rand. Given next available 08/13/2023. Patient's wife is also requesting for Dr. Tomasa Rand to give recommendation for next PCP.

## 2023-06-18 ENCOUNTER — Ambulatory Visit (INDEPENDENT_AMBULATORY_CARE_PROVIDER_SITE_OTHER): Payer: Medicare Other | Admitting: Gastroenterology

## 2023-06-18 ENCOUNTER — Other Ambulatory Visit (INDEPENDENT_AMBULATORY_CARE_PROVIDER_SITE_OTHER): Payer: Medicare Other

## 2023-06-18 ENCOUNTER — Encounter: Payer: Self-pay | Admitting: Gastroenterology

## 2023-06-18 VITALS — BP 130/82 | HR 73 | Ht 66.0 in | Wt 191.2 lb

## 2023-06-18 DIAGNOSIS — D649 Anemia, unspecified: Secondary | ICD-10-CM | POA: Diagnosis not present

## 2023-06-18 DIAGNOSIS — N289 Disorder of kidney and ureter, unspecified: Secondary | ICD-10-CM | POA: Diagnosis not present

## 2023-06-18 DIAGNOSIS — K51018 Ulcerative (chronic) pancolitis with other complication: Secondary | ICD-10-CM

## 2023-06-18 DIAGNOSIS — Z9049 Acquired absence of other specified parts of digestive tract: Secondary | ICD-10-CM

## 2023-06-18 DIAGNOSIS — K51919 Ulcerative colitis, unspecified with unspecified complications: Secondary | ICD-10-CM | POA: Diagnosis not present

## 2023-06-18 DIAGNOSIS — K5190 Ulcerative colitis, unspecified, without complications: Secondary | ICD-10-CM | POA: Diagnosis not present

## 2023-06-18 LAB — COMPREHENSIVE METABOLIC PANEL
ALT: 10 U/L (ref 0–53)
AST: 12 U/L (ref 0–37)
Albumin: 4 g/dL (ref 3.5–5.2)
Alkaline Phosphatase: 44 U/L (ref 39–117)
BUN: 32 mg/dL — ABNORMAL HIGH (ref 6–23)
CO2: 18 meq/L — ABNORMAL LOW (ref 19–32)
Calcium: 9.4 mg/dL (ref 8.4–10.5)
Chloride: 113 meq/L — ABNORMAL HIGH (ref 96–112)
Creatinine, Ser: 2.02 mg/dL — ABNORMAL HIGH (ref 0.40–1.50)
GFR: 31.57 mL/min — ABNORMAL LOW (ref >60.00)
Glucose, Bld: 98 mg/dL (ref 70–99)
Potassium: 3.9 meq/L (ref 3.5–5.1)
Sodium: 139 meq/L (ref 135–145)
Total Bilirubin: 0.4 mg/dL (ref 0.2–1.2)
Total Protein: 6.7 g/dL (ref 6.0–8.3)

## 2023-06-18 LAB — IBC + FERRITIN
Ferritin: 13.5 ng/mL — ABNORMAL LOW (ref 22.0–322.0)
Iron: 22 ug/dL — ABNORMAL LOW (ref 42–165)
Saturation Ratios: 6.1 % — ABNORMAL LOW (ref 20.0–50.0)
TIBC: 361.2 ug/dL (ref 250.0–450.0)
Transferrin: 258 mg/dL (ref 212.0–360.0)

## 2023-06-18 LAB — CBC WITH DIFFERENTIAL/PLATELET
Basophils Absolute: 0.1 10*3/uL (ref 0.0–0.1)
Basophils Relative: 0.8 % (ref 0.0–3.0)
Eosinophils Absolute: 0.2 10*3/uL (ref 0.0–0.7)
Eosinophils Relative: 1.9 % (ref 0.0–5.0)
HCT: 33 % — ABNORMAL LOW (ref 39.0–52.0)
Hemoglobin: 10.3 g/dL — ABNORMAL LOW (ref 13.0–17.0)
Lymphocytes Relative: 6.6 % — ABNORMAL LOW (ref 12.0–46.0)
Lymphs Abs: 0.6 10*3/uL — ABNORMAL LOW (ref 0.7–4.0)
MCHC: 31.2 g/dL (ref 30.0–36.0)
MCV: 75.8 fL — ABNORMAL LOW (ref 78.0–100.0)
Monocytes Absolute: 0.6 10*3/uL (ref 0.1–1.0)
Monocytes Relative: 7.3 % (ref 3.0–12.0)
Neutro Abs: 7.3 10*3/uL (ref 1.4–7.7)
Neutrophils Relative %: 83.4 % — ABNORMAL HIGH (ref 43.0–77.0)
Platelets: 386 10*3/uL (ref 150.0–400.0)
RBC: 4.35 Mil/uL (ref 4.22–5.81)
RDW: 23 % — ABNORMAL HIGH (ref 11.5–15.5)
WBC: 8.8 10*3/uL (ref 4.0–10.5)

## 2023-06-18 MED ORDER — DIPHENOXYLATE-ATROPINE 2.5-0.025 MG PO TABS: 1.0000 | ORAL_TABLET | Freq: Four times a day (QID) | ORAL | 3 refills | Status: DC | PRN

## 2023-06-18 NOTE — Progress Notes (Signed)
 Discussed the use of AI scribe software for clinical note transcription with the patient, who gave verbal consent to proceed.  HPI : Herbert Morrison is a 78 y.o. male  with a history of ulcerative colitis, lupus, rheumatoid arthritis and GERD/hiatal hernia status post hernia repair/fundoplication who presents for follow-up after recent subtotal colectomy.  He had undergone a routine dysplasia surveillance colonoscopy in July 2024 and is found to have numerous large, flat and indistinct polyps in the right colon.  A few polyps were biopsied or removed and were consistent with hyperplastic and sessile serrated polyps.  He underwent a repeat colonoscopy in October 2024 with attempted resection, but due to the vast number, size and indistinct morphology of some of the polyps, adequate resection was not felt to be possible.  He experienced a post polypectomy bleed following this colonoscopy and was hospitalized for a few days with observation only.  No repeat colonoscopy was needed.  He was referred to colorectal surgery, and after discussion with [facility]'s IBD and advanced endoscopy teams, he was recommended to undergo prophylactic colectomy.  He underwent a laparoscopic subtotal colectomy April 06, 2023.  The surgery was uncomplicated and pathology showed numerous sessile serrated adenomas, focal active colitis and diverticulosis.  No malignancy.  He was admitted for a few days in the middle of December with rectal bleeding and a hemoglobin drop from 10 to7.  He received a unit packed red blood cells and a CTA was negative.  The bleeding subsided spontaneously.  His Eliquis was discontinued.  Since his surgery, he has had significant diarrhea and bowel frequency.  He was admitted again in early January with generalized weakness and altered mental status attributed to dehydration.  He was noted to have acute kidney injury with creatinine of 3.9, which improved to 1.9 on discharge.  His baseline creatinine prior  to his surgery was around 1.2-1.3.  Since the surgery, he has been experiencing persistent diarrhea, with bowel movements ranging from four to eight times a day, sometimes more. He experiences urgency and occasional false alarms where he feels the need to defecate but only passes gas. Certain foods, such as cabbage, broccoli, beans, and sugars, exacerbate his symptoms, and he has adjusted his diet accordingly. Despite these changes, he continues to experience significant gas and bloating, though he has not had any bloody stools since his last hospitalization.  He is currently taking Imodium , two tablets two to three times a day, totaling four to six tablets daily, to manage his diarrhea. He also uses over-the-counter medications like Gas-X for bloating, which provides some relief.  He experiences ongoing fatigue and weakness, which he attributes to low hemoglobin levels, and is currently taking a multivitamin with iron to address this.   He also admits to feeling depressed due to his ongoing medical issues and decline in functional status He has not had labs rechecked since his discharge from the hospital January 6.  He continues to take prednisone  for his lupus, currently at 10 mg daily.  This is also likely helping control his ulcerative colitis.  He is looking for a new PCP, preferably within [place].      .     I initially saw in September 2023 with very bothersome upper GI symptoms (chest discomfort/fullness/pressure and shortness of breath).  He underwent an upper endoscopy September 29 showed a 6 cm hiatal hernia.  He was referred to surgery and eventually underwent a hiatal hernia repair with mesh placement and Toupet fundoplication on  December 29.   4-8 times/per day 2 imodium   2-3x/day     Subtotal colectomy Apr 06, 2023 Pathology from surgery: Terminal ileum and colon with appendix, resection: -Multiple sessile serrated adenomas. -Focal chronic active colitis,  negative for granulomas and dysplasia. -Diverticular disease. -Appendix with fibrous obliteration of lumen. -Multiple benign lymph nodes    Colonoscopy Oct 2024 - One 15 mm polyp in the cecum, removed with a hot snare. Resected and retrieved. Injected. Clips (MR conditional) were placed. Clip manufacturer: [employer].  - One 20 mm polyp in the cecum. Injected. Clips (MR conditional) were placed.  - One 12 mm polyp in the ascending colon, removed with a hot snare. Resected and retrieved.  - One 18 mm polyp in the transverse colon, removed with a hot snare. Resected and retrieved. Injected. Clips (MR conditional) were placed. - Numerous addtional flat, large and indistinct polyps in the right colon. Given the size and indistinct borders, endoscopic resection would be quite difficult and high risk.  - Moderate diverticulosis in the sigmoid colon and in the descending colon.  - The distal rectum and anal verge are normal on retroflexion view  FINAL MICROSCOPIC DIAGNOSIS: A. CECUM, POLYPECTOMY: Sessile serrated adenoma without cytologic atypia, multiple fragments B. ASCENDING COLON, POLYPECTOMY: Sessile serrated adenoma without cytologic atypia, multiple fragments C. TRANSVERSE COLON, POLYPECTOMY: Benign colonic mucosa with no diagnostic abnormality  COMMENT: Deeper sections of each specimen are reviewed.   GROSS DESCRIPTION: A.  Received in formalin is a 2.0 x 1.9 x 0.3 cm aggregate of tan soft tissue fragments.  The specimen is entirely submitted in 2 blocks. B.  Received in formalin are multiple pink-tan, glistening soft tissue fragments measuring 1.2 x 0.9 x 0.2 cm in aggregate.  The specimen is entirely submitted in 1 block. C.  Received in formalin is a single pink-tan, glistening nodule of soft tissue measuring 1.9 x 1.4 x 0.6 cm.  The nodule is serially sectioned and entirely submitted in 1 block. (KW, 02/17/2023)     Colonoscopy July 2024 - Many (>20) flat polyps in  the descending colon, transverse colon, in the ascending colon and in the cecum, with several polyps larger than 2 cm and many greater than 1 cm. One representative polyp was removed with a cold snare. Resected and retrieved.  - Abnormal mucosa in cecum with indistinct borders, concerning for dysplasia, biopsied.  - No evidence of active colitis/inflammation in the entire examined colon. Biopsied.  - One 3 mm polyp in the transverse colon, removed with a cold snare. Resected and retrieved. This appeared consistent with a typical tubular adenoma.  - One 15 mm polyp in the rectum, removed piecemeal using a cold snare. Resected and retrieved. Treatment not successful. Clip (MR conditional) was placed. Clip manufacturer: [employer].  - Severe diverticulosis in the sigmoid colon and in the descending colon.  - The distal rectum and anal verge are normal on retroflexion view.  1. Surgical [P], colon, ascending, polyp (1) SERRATED POLYP COMPATIBLE WITH SESSILE SERRATED POLYP WITHOUT CYTOLOGIC DYSPLASIA 2. Surgical [P], colon, cecum bx pale mucosa BENIGN COLONIC MUCOSA WITH FOCAL HYPERPLASTIC CHANGE NEGATIVE FOR DYSPLASIA AND CARCINOMA 3. Surgical [P], colon, ascending BENIGN COLONIC MUCOSA WITH NO DIAGNOSTIC ABNORMALITY 4. Surgical [P], colon, transverse BENIGN COLONIC MUCOSA AND FRAGMENTS OF HYPERPLASTIC POLYP (SEE MICROSCOPIC COMMENT) 5. Surgical [P], colon, transverse, polyp (1) TUBULAR ADENOMA, 1 FRAGMENT COLONIC MUCOSA WITH HYPERPLASTIC CHANGE NEGATIVE FOR DYSPLASIA AND CARCINOMA 6. Surgical [P], colon, splenic flexure BENIGN COLONIC MUCOSA WITH NO DIAGNOSTIC  ABNORMALITY 7. Surgical [P], colon, descending BENIGN COLONIC MUCOSA WITH NO DIAGNOSTIC ABNORMALITY 8. Surgical [P], colon, sigmoid BENIGN COLONIC MUCOSA WITH NO DIAGNOSTIC ABNORMALITY FOCAL HYPERPLASTIC CHANGE 9. Surgical [P], colon, rectum BENIGN COLONIC MUCOSA WITH NO DIAGNOSTIC ABNORMALITY 10. Surgical [P], colon, rectum, polyp  (1) HYPERPLASTIC POLYP NEGATIVE FOR DYSPLASIA AND CARCINOMA  EGD 07/29/2022  Findings: A surgical stitch was found in the lower third of the esophagus. A medium amount of food (residue) was found in the gastric fundus. Diffuse moderate inflammation characterized by congestion (edema) and  erythema was found in the entire examined stomach. Evidence of a prior partial fundoplication was found in the cardia. This  was characterized by edema and an intact appearance. A TTS dilator was passed through the scope. Dilation of the pylorus and  esophago-gastric junction was performed with an 18-19-20 mm balloon  dilator to 20 mm. The dilation sites were examined following endoscope  reinsertion and showed mild mucosal disruption. The first portion of the duodenum was normal. A 20 mm diverticulum was found in the second portion of the duodenum.  Impression: - Surgical stitch in the lower third of the esophagus. - A medium amount of food (residue) in the stomach. - Gastritis. - A partial fundoplication was found, characterized by  an intact appearance and edema. - EG junction and pylorus were dilated. - Normal first portion of the duodenum. - Duodenal diverticulum of the second portio      FL esophagus barium swallow Result Date: 07/26/2022 STUDY: Esophagram single contrast CLINICAL HISTORY: Recent hiatal hernia repair with mesh outside hospital in early December. Subacute abdominal pain with PO intolerance COMPARISON: CT chest abdomen and pelvis 07/26/2022 PROCEDURE: Following a scout radiograph of the chest, a Single contrast esophagram was performed utilizing Water -soluble contrast. Images were acquired in multiple projections. Barium was not administered. FINDINGS: Scout radiograph of the chest shows mild cardiomegaly. Left lower lobe atelectasis. Small left pleural effusion. Limited esophagram was performed by the radiology resident on-call. The thoracic esophagus is normal in course and  contour. Tapered narrowing of the distal esophagus extending towards the stomach, likely postsurgical changes from prior hernia repair. Narrowest luminal diameter of the esophagus is approximately 4 mm. Mild dilatation of the distal esophagus leading towards this point. No esophageal stricture or mass. No definite leak from the thoracic esophagus. Cervical esophagus was not evaluated. 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 Numerous tertiary contractions observed. Stasis with to and fro flow in the esophagus Gastroesophageal reflux: Not well evaluated on this study. Hiatal hernia: None. Status post hiatal hernia repair with postsurgical deformity involving the proximal stomach.      EGD Sept 2023 (Dr. Stacia) - The examined portions of the nasopharynx, oropharynx and larynx were normal.  - Ectopic gastric mucosa in the upper third of the esophagus.  - Normal esophagus.  - 6 cm hiatal hernia. This may be a factor in the patient's chest discomfort.  - Normal examined duodenum.  - No specimens collected.   Colonoscopy: 01/30/2021 (Dr. Golda) - Perianal skin tags found on perianal exam. - One 4 to 6 mm polyp in the proximal ascending colon. SSL. - One 6 mm polyp in the mid ascending colon, removed with a cold snare. Resected and retrieved - inflammatory polyp - One 7 mm polyp in the distal ascending colon, removed with a cold snare. Resected and retrieved. - benign mucosa - Congested mucosa in the ascending colon. Biopsied - active chronic colitis - Nodular mucosa at the  hepatic flexure. Biopsied - mildly chronic colitis - Localized mild inflammation was found in the proximal transverse colon. Biopsied - chronic colitis - Diverticulosis in the sigmoid colon. - External hemorrhoids.  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   Depression    Dyspnea     GERD (gastroesophageal reflux disease)    Grade I diastolic dysfunction 04/28/2023   Heart murmur    as a child   Hiatal hernia    History of hiatal hernia    Hypertension    Lupus 12/31/2020   Rheumatoid arthritis (HCC) 12/31/2020   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[facility])      Past Surgical History:  Procedure Laterality Date   BIOPSY  01/30/2021   Procedure: BIOPSY;  Surgeon: Golda Claudis PENNER, MD;  [facility];  Service: Endoscopy;;   CATARACT EXTRACTION Left 2023   COLONOSCOPY N/A 04/09/2016   Procedure: COLONOSCOPY;  Surgeon: Claudis PENNER Golda, MD;  [facility];  Service: Endoscopy;  Laterality: N/A;  1:45   COLONOSCOPY WITH PROPOFOL  N/A 01/30/2021   Procedure: COLONOSCOPY WITH PROPOFOL ;  Surgeon: Golda Claudis PENNER, MD;  [facility];  Service: Endoscopy;  Laterality: N/A;   COLONOSCOPY WITH PROPOFOL  N/A 02/17/2023   Procedure: COLONOSCOPY WITH PROPOFOL ;  Surgeon: Stacia Glendia BRAVO, MD;  Location: [facility];  Service: Gastroenterology;  Laterality: N/A;   ELBOW BURSA SURGERY Left 1983   ESOPHAGOGASTRODUODENOSCOPY (EGD) WITH PROPOFOL  N/A 01/30/2021   Procedure: ESOPHAGOGASTRODUODENOSCOPY (EGD) WITH PROPOFOL ;  Surgeon: Golda Claudis PENNER, MD;  [facility];  Service: Endoscopy;  Laterality: N/A;  2:00, pt knows to arrive at 11:00   HEMOSTASIS CLIP PLACEMENT  02/17/2023   Procedure: HEMOSTASIS CLIP PLACEMENT;  Surgeon: Stacia Glendia BRAVO, MD;  [facility];  Service: Gastroenterology;;   KNEE ARTHROSCOPY Right 1979   left elbow     spur removed   left knee surgery     open surgery for a meniscus tear 1970's   lungs  07/2022   POLYPECTOMY  04/09/2016   Procedure: POLYPECTOMY;  Surgeon: Claudis PENNER Golda, MD;  [facility];  Service: Endoscopy;;  cecal polyp   POLYPECTOMY  01/30/2021   Procedure: POLYPECTOMY;  Surgeon: Golda Claudis PENNER, MD;  [facility];  Service: Endoscopy;;   POLYPECTOMY N/A 02/17/2023    Procedure: POLYPECTOMY;  Surgeon: Stacia Glendia BRAVO, MD;  Location: [facility];  Service: Gastroenterology;  Laterality: N/A;   SUBMUCOSAL LIFTING INJECTION  02/17/2023   Procedure: SUBMUCOSAL LIFTING INJECTION;  Surgeon: Stacia Glendia BRAVO, MD;  Location: [facility];  Service: Gastroenterology;;   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   Procedure: LEFT TOTAL HIP ARTHROPLASTY ANTERIOR APPROACH;  Surgeon: Ernie Cough, MD;  [facility]: [facility];  Service: Orthopedics;  Laterality: Left;  70 mins   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   Procedure: TOTAL KNEE ARTHROPLASTY;  Surgeon: Ernie Cough, MD;  Location: [facility];  Service: Orthopedics;  Laterality: Left;  70 mins   UMBILICAL HERNIA REPAIR N/A 05/09/2022   Procedure: PRIMARY UMBILICAL HERNIA REPAIR;  Surgeon: Sheldon Standing, MD;  Location: [facility];  Service: General;  Laterality: N/A;   XI ROBOTIC ASSISTED PARAESOPHAGEAL HERNIA REPAIR N/A 05/09/2022   Procedure: ROBOTIC REPAIR OF PARAESOPHAGEAL HIATAL HERNIA WITH FUNDOPLICATION;  Surgeon: Sheldon Standing, MD;  Location: [facility];  Service: General;  Laterality: N/A;  GEN w/ERAS LOCAL  Family History  Problem Relation Age of Onset   Leukemia Father    Bladder Cancer Father    Stomach cancer Neg Hx    Esophageal cancer Neg Hx    Colon cancer Neg Hx    Pancreatic cancer Neg Hx    Colon polyps Neg Hx    Rectal cancer Neg Hx    Social History   Tobacco Use   Smoking status: Former    Current packs/day: 0.00    Average packs/day: 1 pack/day for 5.0 years (5.0 ttl pk-yrs)    Types: Cigarettes    Start date: 68    Quit date: 45    Years since quitting: 50.1   Smokeless tobacco: Former    Types: Chew    Quit date: 01/22/1965   Tobacco comments:    quit 43 years ago  Vaping Use   Vaping status: Never Used  Substance Use Topics   Alcohol use: Not Currently    Comment: occasionally   Drug use: No   Current Outpatient Medications  Medication Sig Dispense Refill   amLODipine   (NORVASC ) 5 MG tablet Take 1 tablet (5 mg total) by mouth daily. 30 tablet 11   ANORO ELLIPTA  62.5-25 MCG/ACT AEPB Inhale 1 puff into the lungs daily.     buPROPion  (WELLBUTRIN  XL) 300 MG 24 hr tablet Take 300 mg by mouth in the morning.     cyclobenzaprine  (FLEXERIL ) 10 MG tablet Take 10 mg by mouth 3 (three) times daily as needed for muscle spasms.     diphenoxylate -atropine  (LOMOTIL ) 2.5-0.025 MG tablet Take 1-2 tablets by mouth 4 (four) times daily as needed for diarrhea or loose stools. 30 tablet 3   escitalopram  (LEXAPRO ) 20 MG tablet Take 20 mg by mouth in the morning.     fluticasone  (FLONASE ) 50 MCG/ACT nasal spray Place 1 spray into both nostrils daily. (Patient taking differently: Place 1 spray into both nostrils daily as needed for allergies or rhinitis.)     hydrALAZINE  (APRESOLINE ) 100 MG tablet Take 100 mg by mouth 3 (three) times daily.     hydroxychloroquine  (PLAQUENIL ) 200 MG tablet Take 200 mg by mouth 2 (two) times daily.     ketoconazole  (NIZORAL ) 2 % shampoo Apply 1 Application topically every other day. Every other shower     levocetirizine (XYZAL) 5 MG tablet Take 10 mg by mouth daily.     loperamide  (IMODIUM ) 2 MG capsule Take 2 capsules (4 mg total) by mouth as needed for diarrhea or loose stools. 30 capsule 0   LORazepam  (ATIVAN ) 2 MG tablet Take 2 mg by mouth in the morning and at bedtime.     meclizine  (ANTIVERT ) 25 MG tablet Take 25 mg by mouth 3 (three) times daily as needed for dizziness or nausea.     methocarbamol  (ROBAXIN ) 500 MG tablet Take 500 mg by mouth every 8 (eight) hours as needed for muscle spasms.     ondansetron  (ZOFRAN ) 4 MG tablet Take 4 mg by mouth every 6 (six) hours as needed.     predniSONE  (DELTASONE ) 5 MG tablet Take 2 tablets (10 mg total) by mouth in the morning. 60 tablet 1   No current facility-administered medications for this visit.   Allergies  Allergen Reactions   Remicade [Infliximab] Anaphylaxis   Erythromycin Nausea Only   Grass  Pollen(K-O-R-T-Swt Vern) Other (See Comments)    Seasonal allergies    Pollen Extract Other (See Comments)   Sulfasalazine Nausea And Vomiting     Review of  Systems: All systems reviewed and negative except where noted in HPI.    No results found.  Physical Exam: BP 130/82   Pulse 73   Ht 5' 6 (1.676 m)   Wt 191 lb 4 oz (86.8 kg)   BMI 30.87 kg/m  Constitutional: Pleasant,well-developed, Caucasian male in no acute distress.  Accompanied by spouse HEENT: Normocephalic and atraumatic. Conjunctivae are normal. No scleral icterus. Neck supple.  Cardiovascular: Normal rate, regular rhythm.  Pulmonary/chest: Effort normal and breath sounds normal. No wheezing, rales or rhonchi. Abdominal: Soft, nondistended, nontender. Bowel sounds active throughout. There are no masses palpable. No hepatomegaly.  Well-healed laparoscopy port incisions Extremities: no edema Lymphadenopathy: No cervical adenopathy noted. Neurological: Alert and oriented to person place and time. Skin: Skin is warm and dry. No rashes noted. Psychiatric: Normal mood and affect. Behavior is normal.  CBC    Component Value Date/Time   WBC 7.0 05/18/2023 0543   RBC 3.26 (L) 05/18/2023 0543   HGB 7.7 (L) 05/18/2023 0543   HCT 26.1 (L) 05/18/2023 0543   PLT 365 05/18/2023 0543   MCV 80.1 05/18/2023 0543   MCH 23.6 (L) 05/18/2023 0543   MCHC 29.5 (L) 05/18/2023 0543   RDW 17.9 (H) 05/18/2023 0543   LYMPHSABS 1.0 05/15/2023 1540   MONOABS 1.3 (H) 05/15/2023 1540   EOSABS 0.4 05/15/2023 1540   BASOSABS 0.0 05/15/2023 1540    CMP     Component Value Date/Time   NA 138 05/18/2023 0543   K 4.4 05/18/2023 0543   CL 117 (H) 05/18/2023 0543   CO2 16 (L) 05/18/2023 0543   GLUCOSE 88 05/18/2023 0543   BUN 42 (H) 05/18/2023 0543   CREATININE 1.90 (H) 05/18/2023 0543   CREATININE 1.50 (H) 04/15/2021 1314   CALCIUM  9.4 05/18/2023 0543   PROT 6.7 05/15/2023 1705   ALBUMIN  3.6 05/15/2023 1705   AST 20 05/15/2023  1705   ALT 21 05/15/2023 1705   ALKPHOS 63 05/15/2023 1705   BILITOT 0.5 05/15/2023 1705   GFRNONAA 36 (L) 05/18/2023 0543   GFRAA >60 08/12/2019 0427       Latest Ref Rng & Units 05/18/2023    5:43 AM 05/17/2023    7:11 AM 05/16/2023   12:36 AM  CBC EXTENDED  WBC 4.0 - 10.5 K/uL 7.0  7.0  7.5   RBC 4.22 - 5.81 MIL/uL 3.26  3.47  3.91   Hemoglobin 13.0 - 17.0 g/dL 7.7  8.2  9.4   HCT 60.9 - 52.0 % 26.1  27.2  32.1   Platelets 150 - 400 K/uL 365  387  436       ASSESSMENT AND PLAN:  78 year old male with ulcerative colitis status post recent subtotal colectomy due to extensive colon dysplasia, with ongoing significant post colectomy diarrhea and urgency as well as gas and bloating.  Postop course has been complicated by significant rectal bleeding requiring hospitalization, as well as dehydration with acute renal injury.  Post-Subtotal Colectomy Diarrhea, bloating/gas Persistent diarrhea post-subtotal colectomy in November. Frequency: 4-8 times/day, including nocturnal episodes. Symptoms: urgency, gas, bloating. No recent hematochezia. Current management: dietary modifications, Imodium .  Explained to patient that his body will continue to adapt to his new anatomy, and eventually the diarrhea will improve.  We can increase his antimotility regimen for the time being.  I recommended he increase his Imodium  to max 8 tablets daily, and we can add Lomotil  in addition for as needed symptom control.  We also discussed Citrucel for stool bulking  and probiotics to possibly help with bloating and gas.  We discussed dietary modifications that may help reduce intestinal gas and bloating. - Increase Imodium  to up to 8 tablets/day - Prescribe Lomotil  PRN, 1 to 2 tablets every 6 hours - Recommend Citrucel for stool bulking (suspect psyllium would likely significantly worsen his gas and bloating) - Continue dietary modifications to avoid gas-producing foods   Ulcerative Colitis (History) Subtotal  colectomy performed.  Minimal active inflammation in pathology. Remaining rectal mucosa requires monitoring for polyps and cancer. Annual flexible sigmoidoscopy needed.  He remains on low-dose prednisone , for his lupus and rheumatoid arthritis, which is also likely controlling his ulcerative colitis.  Given his minimal disease activity, we will hold off on adding additional UC therapy for now - Schedule flexible sigmoidoscopy within the year  Anemia Anemia likely secondary to recent bleeding episodes and possibly renal insufficiency. Hemoglobin 9.2 at last discharge. Currently on multivitamin with iron. Importance of iron supplementation and dietary sources discussed. -Recheck CBC and iron levels - Continue multivitamin with iron, may need to add additional iron supplementation  Renal Insufficiency Renal insufficiency with low creatinine levels noted during recent hospitalization.  Most likely prerenal due to GI losses.  Monitoring required to assess renal function. Discussed impact on anemia recovery. -Recheck BMP  Lancelot Alyea E. Stacia, MD [facility]  I spent a total of 40 minutes reviewing the patient's medical record, interviewing and examining the patient, discussing her diagnosis and management of her condition going forward, and documenting in the medical record     No ref. provider found

## 2023-06-18 NOTE — Patient Instructions (Addendum)
 _______________________________________________________  If your blood pressure at your visit was 140/90 or greater, please contact your primary care physician to follow up on this.  _______________________________________________________  If you are age 76 or older, your body mass index should be between 23-30. Your Body mass index is 30.87 kg/m. If this is out of the aforementioned range listed, please consider follow up with your Primary Care Provider.  If you are age 76 or younger, your body mass index should be between 19-25. Your Body mass index is 30.87 kg/m. If this is out of the aformentioned range listed, please consider follow up with your Primary Care Provider.   ________________________________________________________  The [facility] providers would like to encourage you to use MYCHART to communicate with providers for non-urgent requests or questions.  Due to long hold times on the telephone, sending your provider a message by [facility] may be a faster and more efficient way to get a response.  Please allow 48 business hours for a response.  Please remember that this is for non-urgent requests.  _______________________________________________________  Your provider has requested that you go to the basement level for lab work before leaving today. Press B on the elevator. The lab is located at the first door on the left as you exit the elevator.  Please purchase the following medications over the counter and take as directed: Imodium  continue can up to 8 tablets a day Citrucel daily  We have sent the following medications to your pharmacy for you to pick up at your convenience: Lomotil  as needed-a script has been given to you to take to the pharmacy  You have been scheduled for an appointment with Dr. Stacia on 09-01-23 at 1050 am . Please arrive 10 minutes early for your appointment.   It was a pleasure to see you today!  Thank you for trusting me with your  gastrointestinal care!

## 2023-06-19 ENCOUNTER — Encounter: Payer: Self-pay | Admitting: Gastroenterology

## 2023-06-19 NOTE — Progress Notes (Signed)
 Mr. Flamenco, Your hemoglobin is much improved compared to a month ago, but your iron panel still indicates you are quite deficient.   I think you might benefit from more oral iron supplementation (I know your multivitamin contains some iron). Please take 1 tablet of ferrous sulfate  325 mg daily.  This can turn the stool black.  It can also cause constipation, which would be a welcome side effect for you, I think.  Your kidney function has also not returned to baseline.  This could be because of ongoing dehydration.  Continue to drink plenty of water  and Pedialyte and increase the immodium + Lomotil  as discussed.  I would like to check your kidney function again in 1 month.  If not improving, I will likely refer you to a kidney specialist.

## 2023-06-22 ENCOUNTER — Other Ambulatory Visit: Payer: Self-pay | Admitting: *Deleted

## 2023-06-22 DIAGNOSIS — N289 Disorder of kidney and ureter, unspecified: Secondary | ICD-10-CM

## 2023-07-14 ENCOUNTER — Telehealth: Payer: Self-pay | Admitting: Gastroenterology

## 2023-07-14 NOTE — Telephone Encounter (Signed)
 Patient advised he does NOT need to fast prior to renal function lab draw.

## 2023-07-14 NOTE — Telephone Encounter (Signed)
 Patient called and stated that he would like to know if he needs to fast before getting labs done. Patient is requesting a call back. Please advise.

## 2023-07-15 ENCOUNTER — Telehealth: Payer: Self-pay | Admitting: Gastroenterology

## 2023-07-15 NOTE — Telephone Encounter (Addendum)
 Spoke to Salida the pharmacist at [facility] and did a verbal on the phone for this medication since she said I could. Same sig given and wife made aware

## 2023-07-15 NOTE — Telephone Encounter (Signed)
 Inbound call from patient's wife requesting a call to discuss lomotil. Requesting a higher quantity to be called into pharmacy due to dosage. Stated patient will run out of medication fast and then will have to have a refill.  Please advise, thank you.

## 2023-07-15 NOTE — Telephone Encounter (Signed)
 Dr Tomasa Rand please advise

## 2023-07-21 ENCOUNTER — Other Ambulatory Visit (INDEPENDENT_AMBULATORY_CARE_PROVIDER_SITE_OTHER)

## 2023-07-21 DIAGNOSIS — N289 Disorder of kidney and ureter, unspecified: Secondary | ICD-10-CM

## 2023-07-21 LAB — RENAL FUNCTION PANEL
Albumin: 3.9 g/dL (ref 3.5–5.2)
BUN: 26 mg/dL — ABNORMAL HIGH (ref 6–23)
CO2: 19 meq/L (ref 19–32)
Calcium: 9.9 mg/dL (ref 8.4–10.5)
Chloride: 115 meq/L — ABNORMAL HIGH (ref 96–112)
Creatinine, Ser: 1.89 mg/dL — ABNORMAL HIGH (ref 0.40–1.50)
GFR: 34.17 mL/min — ABNORMAL LOW (ref >60.00)
Glucose, Bld: 117 mg/dL — ABNORMAL HIGH (ref 70–99)
Phosphorus: 2.9 mg/dL (ref 2.3–4.6)
Potassium: 3.8 meq/L (ref 3.5–5.1)
Sodium: 142 meq/L (ref 135–145)

## 2023-07-25 ENCOUNTER — Encounter: Payer: Self-pay | Admitting: Gastroenterology

## 2023-07-25 NOTE — Progress Notes (Signed)
 Mr. Brigance,  Your renal function is stable but has not significantly improved in the past 2 months.  I do think you should see a kidney specialist who can further evaluate your kidney function and provide further guidance on how to limit further decline in your kidney function.   Maya,  Can you please enter a nephrology consult for chronic kidney disease (Stage III)?

## 2023-07-27 ENCOUNTER — Other Ambulatory Visit: Payer: Self-pay

## 2023-07-27 NOTE — Telephone Encounter (Signed)
 Referral faxed to [facility] at [phone]

## 2023-08-19 ENCOUNTER — Ambulatory Visit: Payer: Medicare Other | Admitting: Family Medicine

## 2023-09-01 ENCOUNTER — Ambulatory Visit: Payer: Medicare Other | Admitting: Gastroenterology

## 2023-09-29 ENCOUNTER — Encounter: Payer: Self-pay | Admitting: Family Medicine

## 2023-09-29 ENCOUNTER — Ambulatory Visit (INDEPENDENT_AMBULATORY_CARE_PROVIDER_SITE_OTHER): Payer: Medicare Other | Admitting: Family Medicine

## 2023-09-29 VITALS — BP 146/88 | HR 78 | Temp 97.9°F | Resp 18 | Ht 66.0 in | Wt 188.4 lb

## 2023-09-29 DIAGNOSIS — M546 Pain in thoracic spine: Secondary | ICD-10-CM

## 2023-09-29 DIAGNOSIS — F419 Anxiety disorder, unspecified: Secondary | ICD-10-CM

## 2023-09-29 DIAGNOSIS — N1832 Chronic kidney disease, stage 3b: Secondary | ICD-10-CM

## 2023-09-29 DIAGNOSIS — D649 Anemia, unspecified: Secondary | ICD-10-CM

## 2023-09-29 DIAGNOSIS — I10 Essential (primary) hypertension: Secondary | ICD-10-CM

## 2023-09-29 DIAGNOSIS — K5100 Ulcerative (chronic) pancolitis without complications: Secondary | ICD-10-CM

## 2023-09-29 DIAGNOSIS — M328 Other forms of systemic lupus erythematosus: Secondary | ICD-10-CM

## 2023-09-29 DIAGNOSIS — M459 Ankylosing spondylitis of unspecified sites in spine: Secondary | ICD-10-CM

## 2023-09-29 MED ORDER — BUPROPION HCL ER (XL) 150 MG PO TB24: 150.0000 mg | ORAL_TABLET | Freq: Every morning | ORAL | 1 refills | Status: DC

## 2023-09-29 NOTE — Patient Instructions (Signed)
 Welcome to [facility]! It was a pleasure meeting you today.  As discussed, Please schedule a 1 month follow up visit today.  PLEASE NOTE:  If you had any LAB tests please let us know if you have not heard back within a few days. You may see your results on MyChart before we have a chance to review them but we will give you a call once they are reviewed by [place]. If we ordered any REFERRALS today, please let us know if you have not heard from their office within the next week.  Let us know through MyChart if you are needing REFILLS, or have your pharmacy send [place] the request. You can also use MyChart to communicate with me or any office staff.  Please try these tips to maintain a healthy lifestyle:  Eat most of your calories during the day when you are active. Eliminate processed foods including packaged sweets (pies, cakes, cookies), reduce intake of potatoes, white bread, white pasta, and white rice. Look for whole grain options, oat flour or almond flour.  Each meal should contain half fruits/vegetables, one quarter protein, and one quarter carbs (no bigger than a computer mouse).  Cut down on sweet beverages. This includes juice, soda, and sweet tea. Also watch fruit intake, though this is a healthier sweet option, it still contains natural sugar! Limit to 3 servings daily.  Drink at least 1 glass of water with each meal and aim for at least 8 glasses per day  Exercise at least 150 minutes every week.

## 2023-09-29 NOTE — Progress Notes (Signed)
 New Patient Office Visit  Subjective:  Patient ID: Herbert Morrison, male    DOB: 1945-05-18  Age: 78 y.o. MRN: [id number]  CC:  Chief Complaint  Patient presents with   Establish Care    Initial visit to establish care with new pcp Pain in back on right side between shoulder blade and waist, chronic Feels off balanced Having some depression and anxiety, doesn't think medication is working Discuss lupus flares and how bad they are getting    HPI Herbert Morrison presents for new pt.  htn Cad,htn,hld,ED,osa, gerd,UC, lupus, RA   shroff-rheum Discussed the use of AI scribe software for clinical note transcription with the patient, who gave verbal consent to proceed.  History of Present Illness Herbert Morrison is a 78 year old male with rheumatoid arthritis, lupus, and chronic kidney disease who presents with back pain and fatigue. He is accompanied by his wife.  He experiences right-sided back pain between the shoulder blade and waist, sometimes feeling like a rib out of place. The pain has been present for less than a year, with increased intensity in the last month. It is described as excruciating at times, radiating from the back to the front, and varies in intensity. It does not disturb his sleep.  He has lupus and reports extreme exhaustion, often feeling unable to move or get out of bed. He experiences 'brain fog' and feels like he is walking on artificial legs. He has had three consecutive days of feeling better recently, but often struggles with fatigue and weakness. Being on prednisone  didn't really help.  Has RA and taking plaquinil  wants rheum in [place].   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 He avoids NSAIDs and occasionally takes hydrocodone  for pain relief.  He underwent a partial colectomy in November 2024 due to precancerous polyps attributed to ulcerative colitis. Post-surgery, he  experienced significant anemia, requiring transfusions. His hemoglobin has improved from 7.7 to 10, but he continues to take iron supplements and a multivitamin with iron. He takes B12 supplements due to concerns about absorption following his colectomy.  He takes escitalopram  20 mg daily for mood and has stopped taking Wellbutrin . He also takes lorazepam  at night. He has a history of PTSD and sometimes feels his family would be better off without him, though he does not feel suicidal.  He has high blood pressure and takes hydralazine , though not consistently three times a day as prescribed. He previously took amlodipine  and valsartan  but was switched due to medication interactions.  No shortness of breath or pain with deep breaths. He experiences dizziness and balance issues, especially when bending over.     Current Outpatient Medications:    acetaminophen  (TYLENOL ) 500 MG tablet, Take 1,000 mg by mouth., Disp: , Rfl:    amLODipine  (NORVASC ) 10 MG tablet, Take 1 tablet every day by oral route., Disp: , Rfl:    ANORO ELLIPTA  62.5-25 MCG/ACT AEPB, Inhale 1 puff into the lungs daily., Disp: , Rfl:    Cyanocobalamin 3000 MCG SUBL, Place under the tongue., Disp: , Rfl:    cyclobenzaprine  (FLEXERIL ) 10 MG tablet, Take 10 mg by mouth 3 (three) times daily as needed for muscle spasms., Disp: , Rfl:    diphenoxylate -atropine  (LOMOTIL ) 2.5-0.025 MG tablet, Take 1-2 tablets by mouth 4 (four) times daily as needed for diarrhea or loose stools., Disp: 30 tablet, Rfl: 3   escitalopram  (  LEXAPRO ) 20 MG tablet, Take 20 mg by mouth in the morning., Disp: , Rfl:    ferrous sulfate  325 (65 FE) MG tablet, Take 1 tablet every day by oral route., Disp: , Rfl:    fluticasone  (FLONASE ) 50 MCG/ACT nasal spray, Place 2 sprays into both nostrils as needed., Disp: , Rfl:    hydrALAZINE  (APRESOLINE ) 100 MG tablet, Take 100 mg by mouth 3 (three) times daily., Disp: , Rfl:    HYDROcodone -acetaminophen  (NORCO) 10-325 MG  tablet, Take by mouth., Disp: , Rfl:    hydroxychloroquine  (PLAQUENIL ) 200 MG tablet, Take 200 mg by mouth 2 (two) times daily., Disp: , Rfl:    ketoconazole (NIZORAL) 2 % shampoo, Apply 1 Application topically every other day. Every other shower, Disp: , Rfl:    levocetirizine (XYZAL) 5 MG tablet, Take 10 mg by mouth daily., Disp: , Rfl:    LORazepam  (ATIVAN ) 2 MG tablet, Take 2 mg by mouth in the morning and at bedtime., Disp: , Rfl:    meclizine  (ANTIVERT ) 25 MG tablet, Take 25 mg by mouth 3 (three) times daily as needed for dizziness or nausea., Disp: , Rfl:    methocarbamol  (ROBAXIN ) 500 MG tablet, Take 500 mg by mouth every 8 (eight) hours as needed for muscle spasms., Disp: , Rfl:    metoCLOPramide  (REGLAN ) 5 MG tablet, Take by mouth as needed., Disp: , Rfl:    Multiple Vitamins-Minerals (MENS 50+ MULTIVITAMIN PO), , Disp: , Rfl:    ondansetron  (ZOFRAN ) 4 MG tablet, Take 4 mg by mouth every 6 (six) hours as needed., Disp: , Rfl:    SIMETHICONE  PO, Take by mouth., Disp: , Rfl:    buPROPion  (WELLBUTRIN  XL) 150 MG 24 hr tablet, Take 1 tablet (150 mg total) by mouth in the morning., Disp: 30 tablet, Rfl: 1  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   CKD (chronic kidney disease) stage 3, GFR 30-59 ml/min (HCC)    3b   Depression    Dyspnea    GERD (gastroesophageal reflux disease)    Grade I diastolic dysfunction 04/28/2023   Heart murmur    as a child   Hiatal hernia    History of hiatal hernia    Hypertension    Lupus 12/31/2020   Rheumatoid arthritis (HCC) 12/31/2020   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[facility])     Past Surgical History:  Procedure Laterality Date   BIOPSY  01/30/2021   Procedure: BIOPSY;  Surgeon: Ruby Corporal, MD;  [facility];  Service: Endoscopy;;   CATARACT EXTRACTION Left 2023   COLONOSCOPY N/A 04/09/2016   Procedure: COLONOSCOPY;  Surgeon: Ruby Corporal, MD;   [facility];  Service: Endoscopy;  Laterality: N/A;  1:45   COLONOSCOPY WITH PROPOFOL  N/A 01/30/2021   Procedure: COLONOSCOPY WITH PROPOFOL ;  Surgeon: Ruby Corporal, MD;  [facility];  Service: Endoscopy;  Laterality: N/A;   COLONOSCOPY WITH PROPOFOL  N/A 02/17/2023   Procedure: COLONOSCOPY WITH PROPOFOL ;  Surgeon: Elois Hair, MD;  Location: [facility];  Service: Gastroenterology;  Laterality: N/A;   ELBOW BURSA SURGERY Left 1983   ESOPHAGOGASTRODUODENOSCOPY (EGD) WITH PROPOFOL  N/A 01/30/2021   Procedure: ESOPHAGOGASTRODUODENOSCOPY (EGD) WITH PROPOFOL ;  Surgeon: Ruby Corporal, MD;  [facility];  Service: Endoscopy;  Laterality: N/A;  2:00, pt knows to arrive at 11:00   HEMOSTASIS CLIP PLACEMENT  02/17/2023   Procedure: HEMOSTASIS CLIP PLACEMENT;  Surgeon: Elois Hair, MD;  Location: [facility];  Service: Gastroenterology;;   KNEE ARTHROSCOPY Right 1979   left elbow     spur removed   left knee surgery     open surgery for a meniscus tear 1970's   lungs  07/2022   thoracentesis   PARTIAL COLECTOMY  2024   polyps   POLYPECTOMY  04/09/2016   Procedure: POLYPECTOMY;  Surgeon: Ruby Corporal, MD;  [facility];  Service: Endoscopy;;  cecal polyp   POLYPECTOMY  01/30/2021   Procedure: POLYPECTOMY;  Surgeon: Ruby Corporal, MD;  [facility];  Service: Endoscopy;;   POLYPECTOMY N/A 02/17/2023   Procedure: POLYPECTOMY;  Surgeon: Elois Hair, MD;  Location: [facility];  Service: Gastroenterology;  Laterality: N/A;   SUBMUCOSAL LIFTING INJECTION  02/17/2023   Procedure: SUBMUCOSAL LIFTING INJECTION;  Surgeon: Elois Hair, MD;  Location: [facility];  Service: Gastroenterology;;   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   Procedure: LEFT TOTAL HIP ARTHROPLASTY ANTERIOR APPROACH;  Surgeon: Claiborne Crew, MD;  Location: [facility];  Service: Orthopedics;  Laterality: Left;  70 mins   TOTAL KNEE  ARTHROPLASTY Left 08/11/2019   Procedure: TOTAL KNEE ARTHROPLASTY;  Surgeon: Claiborne Crew, MD;  Location: [facility];  Service: Orthopedics;  Laterality: Left;  70 mins   UMBILICAL HERNIA REPAIR N/A 05/09/2022   Procedure: PRIMARY UMBILICAL HERNIA REPAIR;  Surgeon: Candyce Champagne, MD;  Location: [facility];  Service: General;  Laterality: N/A;   XI ROBOTIC ASSISTED PARAESOPHAGEAL HERNIA REPAIR N/A 05/09/2022   Procedure: ROBOTIC REPAIR OF PARAESOPHAGEAL HIATAL HERNIA WITH FUNDOPLICATION;  Surgeon: Candyce Champagne, MD;  Location: [facility];  Service: General;  Laterality: N/A;  GEN w/ERAS LOCAL    Family History  Problem Relation Age of Onset   Leukemia Father    Bladder Cancer Father    Stomach cancer Neg Hx    Esophageal cancer Neg Hx    Colon cancer Neg Hx    Pancreatic cancer Neg Hx    Colon polyps Neg Hx    Rectal cancer Neg Hx     Social History   Socioeconomic History   Marital status: Married    Spouse name: Not on file   Number of children: 2   Years of education: Not on file   Highest education level: Bachelor's degree (e.g., BA, AB, BS)  Occupational History   Not on file  Tobacco Use   Smoking status: Former    Current packs/day: 0.00    Average packs/day: 1 pack/day for 5.0 years (5.0 ttl pk-yrs)    Types: Cigarettes    Start date: 16    Quit date: 46    Years since quitting: 50.4   Smokeless tobacco: Former    Types: Chew    Quit date: 01/22/1965   Tobacco comments:    quit 43 years ago  Vaping Use   Vaping status: Never Used  Substance and Sexual Activity   Alcohol use: Not Currently    Comment: none   Drug use: No   Sexual activity: Yes  Other Topics Concern   Not on file  Social History Narrative   No grands   [place] -3 yrs   Retired -Secondary school teacher, massage therapy   Social Drivers of Health   Financial Resource Strain: Low Risk  (09/25/2023)   Overall Financial Resource Strain (CARDIA)    Difficulty of Paying Living Expenses: Not very hard  Food  Insecurity: No Food Insecurity (09/25/2023)   Hunger Vital Sign    Worried About Running Out of Food in the Last Year: Never true    Ran Out of Food in the Last Year: Never true  Transportation Needs: No Transportation Needs (09/25/2023)   PRAPARE - Administrator, Civil Service (Medical): No    Lack of Transportation (Non-Medical): No  Physical Activity: Insufficiently Active (09/25/2023)   Exercise Vital Sign    Days of Exercise per Week: 3 days    Minutes of Exercise per Session: 10 min  Stress: Stress Concern Present (09/25/2023)   [employer] of Occupational Health - Occupational Stress Questionnaire    Feeling of Stress : To some extent  Social Connections: Socially Integrated (09/25/2023)   Social Connection and Isolation Panel [NHANES]    Frequency of Communication with Friends and Family: More than three times a week    Frequency of Social Gatherings with Friends and Family: Three times a week    Attends Religious Services: 1 to 4 times per year    Active Member of Clubs or Organizations: No    Attends Banker Meetings: 1 to 4 times per year    Marital Status: Married  Catering manager Violence: Not At Risk (05/16/2023)   Humiliation, Afraid, Rape, and Kick questionnaire    Fear of Current or Ex-Partner: No    Emotionally Abused: No    Physically Abused: No    Sexually Abused: No    ROS  ROS: Gen: no fever, chills  Skin: no rash, itching  Objective:   Today's Vitals: BP (!) 146/88   Pulse 78   Temp 97.9 F (36.6 C) (Temporal)   Resp 18   Ht 5\' 6"  (1.676 m)   Wt 188 lb 6 oz (85.4 kg)   SpO2 97%   BMI 30.40 kg/m   Physical Exam  Gen: WDWN NAD HEENT: NCAT, conjunctiva not injected, sclera nonicteric  NECK:  supple, no thyromegaly, no nodes, no carotid bruits CARDIAC: RRR, S1S2+, no murmur. DP 2+B LUNGS: CTAB. No wheezes ABDOMEN:  BS+, soft, NTND, No HSM, no masses EXT:  no edema MSK: no gross abnormalities.  X slow gait.  No ttp  spine nor R ribs x 1 spot posterolaterally NEURO: A&O x3.  CN II-XII intact.  PSYCH: normal mood. Good eye contact   Spent w/pt wife getting hx, some plan in place, reviewing records, etc.   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  Dispense: 30 tablet; Refill: 1  Assessment and Plan Assessment & Plan Lupus   Lupus presents with extreme fatigue, weakness, and brain fog. He recently completed a prednisone  taper and is currently managed with hydroxychloroquine . Symptoms may be exacerbated by anemia or other conditions. requesting a referral to a local rheumatologist. Continue hydroxychloroquine , review rheumatologist records for recent labs and treatment plan, and consider referral to a local rheumatologist.  Rheumatoid Arthritis   Rheumatoid arthritis is managed with hydroxychloroquine . Prednisone  is avoided due to side effects. Fatigue and weakness may be related to rheumatoid arthritis or lupus, with the recent prednisone  taper not  significantly impacting symptoms. Continue hydroxychloroquine  and review rheumatologist records for recent labs and treatment plan.  Chronic Kidney Disease, Stage 3B   Chronic kidney disease stage 3B is likely secondary to lupus, with GFR indicating stage 3B, closer to stage 4. Avoid NSAIDs due to potential nephrotoxicity. Referral to a nephrologist is in process.  Avoid NSAIDs such as Motrin, ibuprofen, Advil, Aleve, and consider referral to a nephrologist if the condition progresses.  Ulcerative Colitis   Ulcerative colitis with  partial colectomy due to precancerous polyps shows improved diarrhea but still loose stools. Anemia is likely related to previous bleeding episodes, and B12 absorption may be impaired due to colectomy, requiring alternative supplementation. Monitor stool consistency and frequency, review recent GI records and labs, and consider sublingual B12 or B12 injections if levels are low.  Anemia   Anemia is secondary to previous bleeding and partial colectomy. Hemoglobin levels are improving but remain below normal, with low iron stores. Anemia may contribute to fatigue and weakness. Continue multivitamin with iron and iron supplement, and review recent lab results for hemoglobin and iron levels.  Hypertension   Hypertension is managed with hydralazine  and amlodipine . Blood pressure readings have improved but remain slightly elevated. Hydralazine  was chosen due to previous medication interactions and kidney function concerns. Continue hydralazine  and amlodipine , and monitor blood pressure regularly.  Depression   Depression presents with fatigue, lack of motivation, and social withdrawal. He is currently on escitalopram . Wellbutrin , previously discontinued, may benefit mood and energy. Concerns about medication interactions and side effects were discussed. Restart Wellbutrin  at 150 mg and monitor mood and energy levels.  Back Pain   Chronic back pain with intermittent sharp pain radiates from the right back to the right front, possibly due to nerve compression. Previous x-rays showed no fractures. Pain may be related to nerve compression or other musculoskeletal issues. Review previous x-ray results and consider MRI if symptoms persist or worsen.    Follow-up: Return in about 4 weeks (around 10/27/2023) for chronic follow-up.   Ellsworth Haas, MD

## 2023-10-19 ENCOUNTER — Telehealth: Payer: Self-pay | Admitting: Gastroenterology

## 2023-10-19 NOTE — Telephone Encounter (Signed)
 Inbound call from patient spouse requesting f/u call in regards to upcoming apt for patient.  Apt scheduled for 6/12 at 8:50 am . Patient traveling from [place] which requires him to get up at 4:30 am to arrive by 8:50.   Inquiring if possible televisit can be done?   Did check for sooner apt's unfortunately provider has nothing available and patient prefers to see provider only at the moment.   Please advise. Thank you

## 2023-10-19 NOTE — Telephone Encounter (Signed)
 Patient has been advised

## 2023-10-20 ENCOUNTER — Ambulatory Visit: Admitting: Gastroenterology

## 2023-10-22 ENCOUNTER — Telehealth: Payer: Self-pay | Admitting: *Deleted

## 2023-10-22 ENCOUNTER — Telehealth (INDEPENDENT_AMBULATORY_CARE_PROVIDER_SITE_OTHER): Admitting: Gastroenterology

## 2023-10-22 DIAGNOSIS — D649 Anemia, unspecified: Secondary | ICD-10-CM

## 2023-10-22 DIAGNOSIS — M329 Systemic lupus erythematosus, unspecified: Secondary | ICD-10-CM

## 2023-10-22 DIAGNOSIS — K5120 Ulcerative (chronic) proctitis without complications: Secondary | ICD-10-CM

## 2023-10-22 DIAGNOSIS — Z9049 Acquired absence of other specified parts of digestive tract: Secondary | ICD-10-CM

## 2023-10-22 DIAGNOSIS — N183 Chronic kidney disease, stage 3 unspecified: Secondary | ICD-10-CM

## 2023-10-22 DIAGNOSIS — R197 Diarrhea, unspecified: Secondary | ICD-10-CM | POA: Diagnosis not present

## 2023-10-22 DIAGNOSIS — K51019 Ulcerative (chronic) pancolitis with unspecified complications: Secondary | ICD-10-CM

## 2023-10-22 DIAGNOSIS — N189 Chronic kidney disease, unspecified: Secondary | ICD-10-CM | POA: Diagnosis not present

## 2023-10-22 DIAGNOSIS — K5190 Ulcerative colitis, unspecified, without complications: Secondary | ICD-10-CM | POA: Diagnosis not present

## 2023-10-22 DIAGNOSIS — K9189 Other postprocedural complications and disorders of digestive system: Secondary | ICD-10-CM

## 2023-10-22 DIAGNOSIS — Z9889 Other specified postprocedural states: Secondary | ICD-10-CM

## 2023-10-22 DIAGNOSIS — Z860101 Personal history of adenomatous and serrated colon polyps: Secondary | ICD-10-CM

## 2023-10-22 DIAGNOSIS — D631 Anemia in chronic kidney disease: Secondary | ICD-10-CM

## 2023-10-22 DIAGNOSIS — Z860100 Personal history of colon polyps, unspecified: Secondary | ICD-10-CM

## 2023-10-22 NOTE — Telephone Encounter (Signed)
 Copied from CRM [id number]. Topic: General - Other >> Oct 22, 2023  3:59 PM Aisha D wrote: Reason for CRM: Pt's wife is calling regarding the appt scheduled on 6/20 for the 4 week fu. Wife would like to know I the pt is getting blood work done at the appt and would like a callback regarding this concern.

## 2023-10-22 NOTE — Patient Instructions (Signed)
Your provider has requested that you go to the basement level for lab work before leaving today. Press "B" on the elevator. The lab is located at the first door on the left as you exit the elevator.   ______________________________________________________  If your blood pressure at your visit was 140/90 or greater, please contact your primary care physician to follow up on this.  _______________________________________________________  If you are age 76 or older, your body mass index should be between 23-30. Your There is no height or weight on file to calculate BMI. If this is out of the aforementioned range listed, please consider follow up with your Primary Care Provider.  If you are age 29 or younger, your body mass index should be between 19-25. Your There is no height or weight on file to calculate BMI. If this is out of the aformentioned range listed, please consider follow up with your Primary Care Provider.   ________________________________________________________  The [facility] providers would like to encourage you to use [facility] to communicate with providers for non-urgent requests or questions.  Due to long hold times on the telephone, sending your provider a message by [facility] may be a faster and more efficient way to get a response.  Please allow 48 business hours for a response.  Please remember that this is for non-urgent requests.  _______________________________________________________ It was a pleasure to see you today!  Thank you for trusting me with your gastrointestinal care!

## 2023-10-22 NOTE — Progress Notes (Signed)
 Virtual Visit via Video Note  I connected with Herbert Morrison on 10/23/23 at  8:50 AM EDT by a video enabled telemedicine application and verified that I am speaking with the correct person using two identifiers.  Location: Patient: Home Provider: Office   I discussed the limitations of evaluation and management by telemedicine and the availability of in person appointments. The patient expressed understanding and agreed to proceed.  History of Present Illness:  Herbert Morrison is a 78 y.o. male with a history of ulcerative colitis, lupus, rheumatoid arthritis and GERD/hiatal hernia status post hernia repair/fundoplication who presents for follow-up after recent subtotal colectomy.  He had undergone a routine dysplasia surveillance colonoscopy in July 2024 and was found to have numerous large, flat and indistinct polyps in the right colon.  A few polyps were biopsied or removed and were consistent with hyperplastic and sessile serrated polyps.  He underwent a repeat colonoscopy in October 2024 with attempted resection, but due to the vast number, size and indistinct morphology of some of the polyps, adequate resection was not felt to be possible.  He experienced a post polypectomy bleed following this colonoscopy and was hospitalized for a few days with observation only.  No repeat colonoscopy was needed.  He was referred to colorectal surgery, and after discussion with [facility]'s IBD and advanced endoscopy teams, he was recommended to undergo prophylactic colectomy.  He underwent a laparoscopic subtotal colectomy April 06, 2023.  The surgery was uncomplicated and pathology showed numerous sessile serrated adenomas, focal active colitis and diverticulosis.  No malignancy.  He was admitted for a few days in the middle of December with rectal bleeding and a hemoglobin drop from 10 to7.  He received a unit packed red blood cells and a CTA was negative.  The bleeding subsided spontaneously.  His  Eliquis was discontinued.   He was admitted again in early January with generalized weakness and altered mental status attributed to dehydration.  He was noted to have acute kidney injury with creatinine of 3.9, which improved to 1.9 on discharge.  His baseline creatinine prior to his surgery was around 1.2-1.3.   Since the surgery, he has been experiencing persistent diarrhea, with bowel movements ranging from four to eight times a day, sometimes more. He experiences urgency and occasional false alarms where he feels the need to defecate but only passes gas.   He was last seen by me in February at which time I recommended increasing his antimotility regimen to Imodium  to 8 tabs/day and adding Lomotil  PRN, 1-2 tabs q6hrs We rechecked a CBC, iron panel and renal function.  CBC showed hgb 10, improved from 7.7, but ongoing iron deficiency, and continued renal insufficiency, with Scr of 2.02.  A nephrology consult was ordered, but it does not appear he was ever seen by nephrology.  Today, he reports persistent diarrhea since his surgery in December, with six to seven bowel movements per day on average, with usually one of these at night. The stools are mostly liquid, with occasional 'cow patty' consistency. He frequently has urges to defecate that result in passing gas instead of stool. He takes two Lomotil  tablets three times a day with meals but has not used Imodium  recently (he did not realize he could take the Lomotil  and imodium  together). There is no blood in the stool or intestinal cramps.  He experiences significant fatigue and weakness, impacting his ability to walk even short distances. A friend assists him with walking, but  he often cannot walk far. He lacks energy and feels very tired most of the time. Although he sleeps well at night, he often wakes up due to gas rumbling in his stomach and sometimes needs to have bowel movements at night. He takes Lomotil  before bed to manage this.  He has a  poor appetite and often finds food unappealing, eating only a few bites before losing interest. He experiences severe hiccups after eating but denies nausea, vomiting, heartburn, or acid reflux. He has experienced significant weight loss, now weighing 184-185 pounds.  Prior to his colectomy he was 220lbs.  He is anemic despite taking iron and B12 supplements, and he lacks stamina and strength. He has not seen a kidney specialist.  His history includes lupus, currently managed with Plaquenil . He was previously on prednisone  but is no longer taking it after completing a high-dose course. He uses lactate milk in coffee and adds about a teaspoon of sugar, avoiding high-sugar foods and artificial sweeteners, opting for natural sugars like honey.        Observations/Objective:  Patient appears alert, but fatigued.  Accompanied by wife  Assessment and Plan:  78 year old male with ulcerative colitis status post recent subtotal colectomy due to extensive colon dysplasia, with ongoing significant post colectomy diarrhea and urgency.  No tenesmus, abdominal pain or blood in stool.     Post-Subtotal Colectomy Diarrhea, bloating/gas Persistent diarrhea post-subtotal colectomy in November. Frequency: 4-8 times/day, including nocturnal episodes.  No recent hematochezia. Current management: dietary modifications, Imodium .  Previously had recommended increasing motility regimen, but patient misunderstood recs.  Advised he again use both Lomotil  and Imodium  to decrease stool frequency to 3-4 per day and reduce nocturnal bowel movements Although low suspicion for active UC contributing to his symptoms, will check fecal calprotectin.  If elevated, will need to add therapy for UC.  We discussed foods that may contribute to diarrhea, namely dairy, sugary foods, fatty/fried foods.  Recommended avoiding these foods and increase lean protein intake given weight loss.  Supplemental shakes such as Boost/Ensure could be tried,  but may worsen diarrhea. - Continue Lomotil  to two tablets three times daily. - Add Imodium , 1-2 tabs in the morning after first bowel movement, and then again in evening after dinner - Order fecal calprotectin test. - Encourage reduction of dairy, sugary foods, and artificial sweeteners. - Increase lean protein intake and consider protein drinks.   Ulcerative Colitis Subtotal colectomy performed.  Minimal active inflammation in pathology. Remaining rectal mucosa requires monitoring for polyps and cancer. Annual flexible sigmoidoscopy needed.  No longer on low prednisone , which he had been on for years for his RA/lupus   - Schedule flexible sigmoidoscopy for Oct 2025 - Fecal calprotectin as above - Will reconsider starting a maintenance medication based on calprotectin and endoscopy findings.   Anemia, likely multifactorial, from renal insufficiency, chronic illness/lupus, recent major surgery/GI bleed and poor nutrition - Increase oral iron to twice daily if tolerated (may hopefully also reduce bowel frequency) - Take iron with vitamin C or orange juice. 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  - Previously referred to nephrology, but no appointment made - Patient has follow up with Dr. Waldo Guitar later this month; defer further nephrology referral to her   Systemic lupus erythematosus Long-standing systemic lupus erythematosus on Plaquenil . No recent prednisone  use. Lupus may contribute to kidney issues.  Recording duration: 23 minutes        Follow Up Instructions:    I discussed the assessment and treatment plan with the patient. The patient was provided an opportunity to ask questions and all were answered. The patient agreed with the plan and demonstrated an understanding of the instructions.   The patient was  advised to call back or seek an in-person evaluation if the symptoms worsen or if the condition fails to improve as anticipated.  I provided 45 minutes of non-face-to-face time during this encounter.   Elois Hair, MD    Subtotal colectomy Apr 06, 2023 Pathology from surgery: Terminal ileum and colon with appendix, resection: -Multiple sessile serrated adenomas. -Focal chronic active colitis, negative for granulomas and dysplasia. -Diverticular disease. -Appendix with fibrous obliteration of lumen. -Multiple benign lymph nodes      Colonoscopy Oct 2024 - One 15 mm polyp in the cecum, removed with a hot snare. Resected and retrieved. Injected. Clips (MR conditional) were placed. Clip manufacturer: [employer].  - One 20 mm polyp in the cecum. Injected. Clips (MR conditional) were placed.  - One 12 mm polyp in the ascending colon, removed with a hot snare. Resected and retrieved.  - One 18 mm polyp in the transverse colon, removed with a hot snare. Resected and retrieved. Injected. Clips (MR conditional) were placed. - Numerous addtional flat, large and indistinct polyps in the right colon. Given the size and indistinct borders, endoscopic resection would be quite difficult and high risk.  - Moderate diverticulosis in the sigmoid colon and in the descending colon.  - The distal rectum and anal verge are normal on retroflexion view   FINAL MICROSCOPIC DIAGNOSIS: A. CECUM, POLYPECTOMY: Sessile serrated adenoma without cytologic atypia, multiple fragments B. ASCENDING COLON, POLYPECTOMY: Sessile serrated adenoma without cytologic atypia, multiple fragments C. TRANSVERSE COLON, POLYPECTOMY: Benign colonic mucosa with no diagnostic abnormality  COMMENT: Deeper sections of each specimen are reviewed.   GROSS DESCRIPTION: A.  Received in formalin is a 2.0 x 1.9 x 0.3 cm aggregate of tan soft tissue fragments.  The specimen is entirely submitted in 2 blocks. B.  Received  in formalin are multiple pink-tan, glistening soft tissue fragments measuring 1.2 x 0.9 x 0.2 cm in aggregate.  The specimen is entirely submitted in 1 block. C.  Received in formalin is a single pink-tan, glistening nodule of soft tissue measuring 1.9 x 1.4 x 0.6 cm.  The nodule is serially sectioned and entirely submitted in 1 block. (KW, 02/17/2023)        Colonoscopy July 2024 - Many (>20) flat polyps in the descending colon, transverse colon, in the ascending colon and in the cecum, with several polyps larger than 2 cm and many greater than 1 cm. One representative polyp was removed with a cold snare. Resected and retrieved.  - Abnormal mucosa in cecum with indistinct borders, concerning for dysplasia, biopsied.  - No evidence of active colitis/inflammation in the entire examined colon. Biopsied.  - One 3 mm polyp in the transverse colon, removed with a cold snare. Resected and retrieved. This appeared consistent with a typical tubular adenoma.  - One 15 mm polyp in the rectum, removed piecemeal using a cold snare. Resected and retrieved. Treatment not successful. Clip (MR conditional) was placed. Clip manufacturer: [employer].  - Severe diverticulosis in the sigmoid colon and in the descending colon.  - The distal rectum and anal verge are normal on retroflexion view.   1. Surgical [P], colon, ascending, polyp (  1) SERRATED POLYP COMPATIBLE WITH SESSILE SERRATED POLYP WITHOUT CYTOLOGIC DYSPLASIA 2. Surgical [P], colon, cecum bx pale mucosa BENIGN COLONIC MUCOSA WITH FOCAL HYPERPLASTIC CHANGE NEGATIVE FOR DYSPLASIA AND CARCINOMA 3. Surgical [P], colon, ascending BENIGN COLONIC MUCOSA WITH NO DIAGNOSTIC ABNORMALITY 4. Surgical [P], colon, transverse BENIGN COLONIC MUCOSA AND FRAGMENTS OF HYPERPLASTIC POLYP (SEE MICROSCOPIC COMMENT) 5. Surgical [P], colon, transverse, polyp (1) TUBULAR ADENOMA, 1 FRAGMENT COLONIC MUCOSA WITH HYPERPLASTIC CHANGE NEGATIVE FOR DYSPLASIA AND CARCINOMA 6.  Surgical [P], colon, splenic flexure BENIGN COLONIC MUCOSA WITH NO DIAGNOSTIC ABNORMALITY 7. Surgical [P], colon, descending BENIGN COLONIC MUCOSA WITH NO DIAGNOSTIC ABNORMALITY 8. Surgical [P], colon, sigmoid BENIGN COLONIC MUCOSA WITH NO DIAGNOSTIC ABNORMALITY FOCAL HYPERPLASTIC CHANGE 9. Surgical [P], colon, rectum BENIGN COLONIC MUCOSA WITH NO DIAGNOSTIC ABNORMALITY 10. Surgical [P], colon, rectum, polyp (1) HYPERPLASTIC POLYP NEGATIVE FOR DYSPLASIA AND CARCINOMA   EGD 07/29/2022  Findings: A surgical stitch was found in the lower third of the esophagus. A medium amount of food (residue) was found in the gastric fundus. Diffuse moderate inflammation characterized by congestion (edema) and  erythema was found in the entire examined stomach. Evidence of a prior partial fundoplication was found in the cardia. This  was characterized by edema and an intact appearance. A TTS dilator was passed through the scope. Dilation of the pylorus and  esophago-gastric junction was performed with an 18-19-20 mm balloon  dilator to 20 mm. The dilation sites were examined following endoscope  reinsertion and showed mild mucosal disruption. The first portion of the duodenum was normal. A 20 mm diverticulum was found in the second portion of the duodenum.  Impression: - Surgical stitch in the lower third of the esophagus. - A medium amount of food (residue) in the stomach. - Gastritis. - A partial fundoplication was found, characterized by  an intact appearance and edema. - EG junction and pylorus were dilated. - Normal first portion of the duodenum. - Duodenal diverticulum of the second portio      FL esophagus barium swallow Result Date: 07/26/2022 STUDY: Esophagram single contrast CLINICAL HISTORY: Recent hiatal hernia repair with mesh outside hospital in early December. Subacute abdominal pain with PO intolerance COMPARISON: CT chest abdomen and pelvis 07/26/2022 PROCEDURE: Following a scout  radiograph of the chest, a Single contrast esophagram was performed utilizing Water -soluble contrast. Images were acquired in multiple projections. Barium was not administered. FINDINGS: Scout radiograph of the chest shows mild cardiomegaly. Left lower lobe atelectasis. Small left pleural effusion. Limited esophagram was performed by the radiology resident on-call. The thoracic esophagus is normal in course and contour. Tapered narrowing of the distal esophagus extending towards the stomach, likely postsurgical changes from prior hernia repair. Narrowest luminal diameter of the esophagus is approximately 4 mm. Mild dilatation of the distal esophagus leading towards this point. No esophageal stricture or mass. No definite leak from the thoracic esophagus. Cervical esophagus was not evaluated. 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 Numerous tertiary contractions observed. Stasis with to and fro flow in the esophagus Gastroesophageal reflux: Not well evaluated on this study. Hiatal hernia: None. Status post hiatal hernia repair with postsurgical deformity involving the proximal stomach.      EGD Sept 2023 (Dr. Cherryl Corona) - The examined portions of the nasopharynx, oropharynx and larynx were normal.  - Ectopic gastric mucosa in the upper third of the esophagus.  - Normal esophagus.  - 6 cm hiatal hernia. This may be a factor in the patient's chest discomfort.  - Normal  examined duodenum.  - No specimens collected.   Colonoscopy: 01/30/2021 (Dr. Homero Luster) - Perianal skin tags found on perianal exam. - One 4 to 6 mm polyp in the proximal ascending colon. SSL. - One 6 mm polyp in the mid ascending colon, removed with a cold snare. Resected and retrieved - inflammatory polyp - One 7 mm polyp in the distal ascending colon, removed with a cold snare. Resected and retrieved. - benign mucosa - Congested mucosa in the ascending colon. Biopsied - active chronic  colitis - Nodular mucosa at the hepatic flexure. Biopsied - mildly chronic colitis - Localized mild inflammation was found in the proximal transverse colon. Biopsied - chronic colitis - Diverticulosis in the sigmoid colon. - External hemorrhoids.   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   CKD (chronic kidney disease) stage 3, GFR 30-59 ml/min (HCC)    3b   Depression    Dyspnea    GERD (gastroesophageal reflux disease)    Grade I diastolic dysfunction 04/28/2023   Heart murmur    as a child   Hiatal hernia    History of hiatal hernia    Hypertension    Lupus 12/31/2020   Rheumatoid arthritis (HCC) 12/31/2020   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[facility])      Past Surgical History:  Procedure Laterality Date   BIOPSY  01/30/2021   Procedure: BIOPSY;  Surgeon: Ruby Corporal, MD;  Location: [facility];  Service: Endoscopy;;   CATARACT EXTRACTION Left 2023   COLONOSCOPY N/A 04/09/2016   Procedure: COLONOSCOPY;  Surgeon: Ruby Corporal, MD;  Location: [facility];  Service: Endoscopy;  Laterality: N/A;  1:45   COLONOSCOPY WITH PROPOFOL  N/A 01/30/2021   Procedure: COLONOSCOPY WITH PROPOFOL ;  Surgeon: Ruby Corporal, MD;  Location: [facility];  Service: Endoscopy;  Laterality: N/A;   COLONOSCOPY WITH PROPOFOL  N/A 02/17/2023   Procedure: COLONOSCOPY WITH PROPOFOL ;  Surgeon: Elois Hair, MD;  Location: [facility];  Service: Gastroenterology;  Laterality: N/A;   ELBOW BURSA SURGERY Left 1983   ESOPHAGOGASTRODUODENOSCOPY (EGD) WITH PROPOFOL  N/A 01/30/2021   Procedure: ESOPHAGOGASTRODUODENOSCOPY (EGD) WITH PROPOFOL ;  Surgeon: Ruby Corporal, MD;  [facility]: [facility];  Service: Endoscopy;  Laterality: N/A;  2:00, pt knows to arrive at 11:00   HEMOSTASIS CLIP PLACEMENT  02/17/2023   Procedure: HEMOSTASIS CLIP PLACEMENT;  Surgeon: Elois Hair, MD;  Location: [facility];   Service: Gastroenterology;;   KNEE ARTHROSCOPY Right 1979   left elbow     spur removed   left knee surgery     open surgery for a meniscus tear 1970's   lungs  07/2022   thoracentesis   PARTIAL COLECTOMY  2024   polyps   POLYPECTOMY  04/09/2016   Procedure: POLYPECTOMY;  Surgeon: Ruby Corporal, MD;  [facility];  Service: Endoscopy;;  cecal polyp   POLYPECTOMY  01/30/2021   Procedure: POLYPECTOMY;  Surgeon: Ruby Corporal, MD;  [facility];  Service: Endoscopy;;   POLYPECTOMY N/A 02/17/2023   Procedure: POLYPECTOMY;  Surgeon: Elois Hair, MD;  Location: [facility];  Service: Gastroenterology;  Laterality: N/A;   SUBMUCOSAL LIFTING INJECTION  02/17/2023   Procedure: SUBMUCOSAL LIFTING INJECTION;  Surgeon: Elois Hair, MD;  Location: [facility];  Service: Gastroenterology;;   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   Procedure: LEFT TOTAL HIP ARTHROPLASTY  ANTERIOR APPROACH;  Surgeon: Claiborne Crew, MD;  Location: [facility];  Service: Orthopedics;  Laterality: Left;  70 mins   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   Procedure: TOTAL KNEE ARTHROPLASTY;  Surgeon: Claiborne Crew, MD;  Location: [facility];  Service: Orthopedics;  Laterality: Left;  70 mins   UMBILICAL HERNIA REPAIR N/A 05/09/2022   Procedure: PRIMARY UMBILICAL HERNIA REPAIR;  Surgeon: Candyce Champagne, MD;  Location: [facility];  Service: General;  Laterality: N/A;   XI ROBOTIC ASSISTED PARAESOPHAGEAL HERNIA REPAIR N/A 05/09/2022   Procedure: ROBOTIC REPAIR OF PARAESOPHAGEAL HIATAL HERNIA WITH FUNDOPLICATION;  Surgeon: Candyce Champagne, MD;  Location: [facility];  Service: General;  Laterality: N/A;  GEN w/ERAS LOCAL   Family History  Problem Relation Age of Onset   Leukemia Father    Bladder Cancer Father    Stomach cancer Neg Hx    Esophageal cancer Neg Hx    Colon cancer Neg Hx    Pancreatic cancer Neg Hx    Colon polyps Neg Hx    Rectal cancer Neg Hx    Social History   Tobacco Use    Smoking status: Former    Current packs/day: 0.00    Average packs/day: 1 pack/day for 5.0 years (5.0 ttl pk-yrs)    Types: Cigarettes    Start date: 63    Quit date: 17    Years since quitting: 50.4   Smokeless tobacco: Former    Types: Chew    Quit date: 01/22/1965   Tobacco comments:    quit 43 years ago  Vaping Use   Vaping status: Never Used  Substance Use Topics   Alcohol use: Not Currently    Comment: none   Drug use: No   Current Outpatient Medications  Medication Sig Dispense Refill   acetaminophen  (TYLENOL ) 500 MG tablet Take 1,000 mg by mouth.     amLODipine  (NORVASC ) 10 MG tablet Take 1 tablet every day by oral route.     ANORO ELLIPTA  62.5-25 MCG/ACT AEPB Inhale 1 puff into the lungs daily.     buPROPion  (WELLBUTRIN  XL) 150 MG 24 hr tablet Take 1 tablet (150 mg total) by mouth in the morning. 30 tablet 1   Cyanocobalamin 3000 MCG SUBL Place under the tongue.     cyclobenzaprine  (FLEXERIL ) 10 MG tablet Take 10 mg by mouth 3 (three) times daily as needed for muscle spasms.     diphenoxylate -atropine  (LOMOTIL ) 2.5-0.025 MG tablet Take 1-2 tablets by mouth 4 (four) times daily as needed for diarrhea or loose stools. 30 tablet 3   escitalopram  (LEXAPRO ) 20 MG tablet Take 20 mg by mouth in the morning.     ferrous sulfate  325 (65 FE) MG tablet Take 1 tablet every day by oral route.     fluticasone  (FLONASE ) 50 MCG/ACT nasal spray Place 2 sprays into both nostrils as needed.     hydrALAZINE  (APRESOLINE ) 100 MG tablet Take 100 mg by mouth 3 (three) times daily.     HYDROcodone -acetaminophen  (NORCO) 10-325 MG tablet Take by mouth.     hydroxychloroquine  (PLAQUENIL ) 200 MG tablet Take 200 mg by mouth 2 (two) times daily.     ketoconazole (NIZORAL) 2 % shampoo Apply 1 Application topically every other day. Every other shower     levocetirizine (XYZAL) 5 MG tablet Take 10 mg by mouth daily.     LORazepam  (ATIVAN ) 2 MG tablet Take 2 mg by mouth in the morning and at bedtime.      meclizine  (ANTIVERT ) 25  MG tablet Take 25 mg by mouth 3 (three) times daily as needed for dizziness or nausea.     methocarbamol  (ROBAXIN ) 500 MG tablet Take 500 mg by mouth every 8 (eight) hours as needed for muscle spasms.     metoCLOPramide  (REGLAN ) 5 MG tablet Take by mouth as needed.     Multiple Vitamins-Minerals (MENS 50+ MULTIVITAMIN PO)      ondansetron  (ZOFRAN ) 4 MG tablet Take 4 mg by mouth every 6 (six) hours as needed.     SIMETHICONE  PO Take by mouth.     No current facility-administered medications for this visit.   Allergies  Allergen Reactions   Other Anaphylaxis    Rumicade   Remicade [Infliximab] Anaphylaxis   Sulfa Antibiotics Anaphylaxis, Diarrhea and Nausea And Vomiting   Erythromycin Nausea Only, Hives and Nausea And Vomiting   Grass Pollen(K-O-R-T-Swt Vern) Other (See Comments)    Seasonal allergies   Pollen Extract Other (See Comments)   Sulfasalazine Nausea And Vomiting     CBC    Component Value Date/Time   WBC 8.8 06/18/2023 1148   RBC 4.35 06/18/2023 1148   HGB 10.3 (L) 06/18/2023 1148   HCT 33.0 (L) 06/18/2023 1148   PLT 386.0 06/18/2023 1148   MCV 75.8 (L) 06/18/2023 1148   MCH 23.6 (L) 05/18/2023 0543   MCHC 31.2 06/18/2023 1148   RDW 23.0 (H) 06/18/2023 1148   LYMPHSABS 0.6 (L) 06/18/2023 1148   MONOABS 0.6 06/18/2023 1148   EOSABS 0.2 06/18/2023 1148   BASOSABS 0.1 06/18/2023 1148    CMP     Component Value Date/Time   NA 142 07/21/2023 1330   K 3.8 07/21/2023 1330   CL 115 (H) 07/21/2023 1330   CO2 19 07/21/2023 1330   GLUCOSE 117 (H) 07/21/2023 1330   BUN 26 (H) 07/21/2023 1330   CREATININE 1.89 (H) 07/21/2023 1330   CREATININE 1.50 (H) 04/15/2021 1314   CALCIUM  9.9 07/21/2023 1330   PROT 6.7 06/18/2023 1148   ALBUMIN  3.9 07/21/2023 1330   AST 12 06/18/2023 1148   ALT 10 06/18/2023 1148   ALKPHOS 44 06/18/2023 1148   BILITOT 0.4 06/18/2023 1148   GFRNONAA 36 (L) 05/18/2023 0543   GFRAA >60 08/12/2019 0427        Latest Ref Rng & Units 06/18/2023   11:48 AM 05/18/2023    5:43 AM 05/17/2023    7:11 AM  CBC EXTENDED  WBC 4.0 - 10.5 K/uL 8.8  7.0  7.0   RBC 4.22 - 5.81 Mil/uL 4.35  3.26  3.47   Hemoglobin 13.0 - 17.0 g/dL 16.1  7.7  8.2   HCT 09.6 - 52.0 % 33.0  26.1  27.2   Platelets 150.0 - 400.0 K/uL 386.0  365  387   NEUT# 1.4 - 7.7 K/uL 7.3     Lymph# 0.7 - 4.0 K/uL 0.6

## 2023-10-23 NOTE — Telephone Encounter (Signed)
 Patient's wife does not have results, does not remember all the labs that were done.

## 2023-10-27 ENCOUNTER — Other Ambulatory Visit: Payer: Self-pay | Admitting: *Deleted

## 2023-10-27 DIAGNOSIS — E785 Hyperlipidemia, unspecified: Secondary | ICD-10-CM

## 2023-10-27 DIAGNOSIS — Z79899 Other long term (current) drug therapy: Secondary | ICD-10-CM

## 2023-10-27 DIAGNOSIS — K51919 Ulcerative colitis, unspecified with unspecified complications: Secondary | ICD-10-CM

## 2023-10-27 DIAGNOSIS — E559 Vitamin D deficiency, unspecified: Secondary | ICD-10-CM

## 2023-10-27 NOTE — Telephone Encounter (Signed)
 Patient and wife notified of message below. Lab orders placed. Scheduled lab appt.

## 2023-10-28 ENCOUNTER — Other Ambulatory Visit (INDEPENDENT_AMBULATORY_CARE_PROVIDER_SITE_OTHER)

## 2023-10-28 DIAGNOSIS — Z79899 Other long term (current) drug therapy: Secondary | ICD-10-CM | POA: Diagnosis not present

## 2023-10-28 DIAGNOSIS — E785 Hyperlipidemia, unspecified: Secondary | ICD-10-CM | POA: Diagnosis not present

## 2023-10-28 DIAGNOSIS — E559 Vitamin D deficiency, unspecified: Secondary | ICD-10-CM

## 2023-10-28 DIAGNOSIS — K51919 Ulcerative colitis, unspecified with unspecified complications: Secondary | ICD-10-CM

## 2023-10-28 LAB — CBC WITH DIFFERENTIAL/PLATELET
Basophils Absolute: 0.1 10*3/uL (ref 0.0–0.1)
Basophils Relative: 1.5 % (ref 0.0–3.0)
Eosinophils Absolute: 0.4 10*3/uL (ref 0.0–0.7)
Eosinophils Relative: 6.7 % — ABNORMAL HIGH (ref 0.0–5.0)
HCT: 37.7 % — ABNORMAL LOW (ref 39.0–52.0)
Hemoglobin: 12.2 g/dL — ABNORMAL LOW (ref 13.0–17.0)
Lymphocytes Relative: 12.8 % (ref 12.0–46.0)
Lymphs Abs: 0.8 10*3/uL (ref 0.7–4.0)
MCHC: 32.3 g/dL (ref 30.0–36.0)
MCV: 81.4 fl (ref 78.0–100.0)
Monocytes Absolute: 0.8 10*3/uL (ref 0.1–1.0)
Monocytes Relative: 11.6 % (ref 3.0–12.0)
Neutro Abs: 4.4 10*3/uL (ref 1.4–7.7)
Neutrophils Relative %: 67.4 % (ref 43.0–77.0)
Platelets: 329 10*3/uL (ref 150.0–400.0)
RBC: 4.63 Mil/uL (ref 4.22–5.81)
RDW: 17.8 % — ABNORMAL HIGH (ref 11.5–15.5)
WBC: 6.5 10*3/uL (ref 4.0–10.5)

## 2023-10-28 LAB — LIPID PANEL
Cholesterol: 147 mg/dL (ref 0–200)
HDL: 56.4 mg/dL (ref >39.00)
LDL Cholesterol: 78 mg/dL (ref 0–99)
NonHDL: 90.77
Total CHOL/HDL Ratio: 3
Triglycerides: 65 mg/dL (ref 0.0–149.0)
VLDL: 13 mg/dL (ref 0.0–40.0)

## 2023-10-28 LAB — COMPREHENSIVE METABOLIC PANEL WITH GFR
ALT: 11 U/L (ref 0–53)
AST: 15 U/L (ref 0–37)
Albumin: 4 g/dL (ref 3.5–5.2)
Alkaline Phosphatase: 44 U/L (ref 39–117)
BUN: 25 mg/dL — ABNORMAL HIGH (ref 6–23)
CO2: 20 meq/L (ref 19–32)
Calcium: 9.7 mg/dL (ref 8.4–10.5)
Chloride: 112 meq/L (ref 96–112)
Creatinine, Ser: 2.42 mg/dL — ABNORMAL HIGH (ref 0.40–1.50)
GFR: 25.35 mL/min — ABNORMAL LOW (ref >60.00)
Glucose, Bld: 96 mg/dL (ref 70–99)
Potassium: 3.8 meq/L (ref 3.5–5.1)
Sodium: 139 meq/L (ref 135–145)
Total Bilirubin: 0.4 mg/dL (ref 0.2–1.2)
Total Protein: 7 g/dL (ref 6.0–8.3)

## 2023-10-28 LAB — VITAMIN D 25 HYDROXY (VIT D DEFICIENCY, FRACTURES): VITD: 49.29 ng/mL (ref 30.00–100.00)

## 2023-10-28 LAB — TSH: TSH: 2.03 u[IU]/mL (ref 0.35–5.50)

## 2023-10-29 LAB — IRON,TIBC AND FERRITIN PANEL
%SAT: 21 % (ref 20–48)
Ferritin: 58 ng/mL (ref 24–380)
Iron: 56 ug/dL (ref 50–180)
TIBC: 271 ug/dL (ref 250–425)

## 2023-10-30 ENCOUNTER — Ambulatory Visit: Payer: Self-pay | Admitting: Family Medicine

## 2023-10-30 ENCOUNTER — Encounter: Payer: Self-pay | Admitting: Family Medicine

## 2023-10-30 ENCOUNTER — Ambulatory Visit (INDEPENDENT_AMBULATORY_CARE_PROVIDER_SITE_OTHER): Admitting: Family Medicine

## 2023-10-30 ENCOUNTER — Telehealth: Payer: Self-pay

## 2023-10-30 VITALS — BP 160/88 | HR 76 | Temp 97.9°F | Resp 18 | Ht 66.0 in | Wt 185.1 lb

## 2023-10-30 DIAGNOSIS — M459 Ankylosing spondylitis of unspecified sites in spine: Secondary | ICD-10-CM | POA: Diagnosis not present

## 2023-10-30 DIAGNOSIS — K5100 Ulcerative (chronic) pancolitis without complications: Secondary | ICD-10-CM

## 2023-10-30 DIAGNOSIS — I10 Essential (primary) hypertension: Secondary | ICD-10-CM

## 2023-10-30 DIAGNOSIS — N184 Chronic kidney disease, stage 4 (severe): Secondary | ICD-10-CM

## 2023-10-30 DIAGNOSIS — F419 Anxiety disorder, unspecified: Secondary | ICD-10-CM

## 2023-10-30 MED ORDER — METHOCARBAMOL 500 MG PO TABS: 500.0000 mg | ORAL_TABLET | Freq: Three times a day (TID) | ORAL | 3 refills | Status: AC | PRN

## 2023-10-30 MED ORDER — BUPROPION HCL ER (XL) 300 MG PO TB24: 300.0000 mg | ORAL_TABLET | Freq: Every morning | ORAL | 1 refills | Status: DC

## 2023-10-30 NOTE — Progress Notes (Signed)
 Subjective:     Patient ID: Herbert Morrison, male    DOB: Mar 09, 1946, 78 y.o.   MRN: [id number]  Chief Complaint  Patient presents with   Medical Management of Chronic Issues    4 week follow-up Discuss recent lab results Requesting refill of robaxin , having back pain    HPI-here w/wife Discussed the use of AI scribe software for clinical note transcription with the patient, who gave verbal consent to proceed.  History of Present Illness Herbert Morrison is a 78 year old male with rheumatoid arthritis and ulcerative colitis who presents for medication management and follow-up.  He is currently taking Plaquenil  for rheumatoid arthritis and has an upcoming appointment with a rheumatologist on July 11th. He is concerned about the cost of certain rheumatology tests and the coordination of lab work between his providers. Xferring care from [place](where they live), to [facility]  He has a history of ulcerative colitis and is awaiting a sigmoidoscopy. He has been experiencing less intestinal distress recently and is not having as much diarrhea as before. He uses Lomotil  and Imodium  to manage symptoms, and his intestines are feeling much better.  He is on multiple medications for blood pressure, including amlodipine  10 mg and hydralazine  3 times a day.   He also takes Wellbutrin  150 mg in the morning for mood, which he feels has been helping with depression, although he still experiences episodes of depression, particularly when alone. He also takes Lexapro  20 mg daily and lorazepam  as needed for anxiety, particularly at night or during stressful situations.  He has a history of back pain and is currently taking Flexeril  but prefers Robaxin , which he finds less sedating. He takes Robaxin  as needed, up to twice a day.  He has a history of kidney issues, with recent labs indicating worsening kidney function. He drinks about four 16.9 oz bottles of water  daily. Has not seen nephrologist  He  experiences severe hiccups after eating, which are painful and cause discomfort. He also reports gas movement at night, which has improved over the past two weeks.  He has a history of anxiety, which is exacerbated by travel and family visits. He uses lorazepam  to manage anxiety during these times.    Health Maintenance Due  Topic Date Due   Medicare Annual Wellness (AWV)  Never done   Hepatitis C Screening  Never done    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   CKD (chronic kidney disease) stage 3, GFR 30-59 ml/min (HCC)    3b   Depression    Dyspnea    GERD (gastroesophageal reflux disease)    Grade I diastolic dysfunction 04/28/2023   Heart murmur    as a child   Hiatal hernia    History of hiatal hernia    Hypertension    Lupus 12/31/2020   Rheumatoid arthritis (HCC) 12/31/2020   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[facility])     Past Surgical History:  Procedure Laterality Date   BIOPSY  01/30/2021   Procedure: BIOPSY;  Surgeon: Golda Claudis PENNER, MD;  [facility];  Service: Endoscopy;;   CATARACT EXTRACTION Left 2023   COLONOSCOPY N/A 04/09/2016   Procedure: COLONOSCOPY;  Surgeon: Claudis PENNER Golda, MD;  [facility];  Service: Endoscopy;  Laterality: N/A;  1:45   COLONOSCOPY WITH PROPOFOL  N/A 01/30/2021   Procedure: COLONOSCOPY WITH PROPOFOL ;  Surgeon: Golda Claudis PENNER, MD;  [facility];  Service: Endoscopy;  Laterality: N/A;   COLONOSCOPY WITH PROPOFOL  N/A 02/17/2023   Procedure: COLONOSCOPY WITH PROPOFOL ;  Surgeon: Stacia Glendia BRAVO, MD;  [facility];  Service: Gastroenterology;  Laterality: N/A;   ELBOW BURSA SURGERY Left 1983   ESOPHAGOGASTRODUODENOSCOPY (EGD) WITH PROPOFOL  N/A 01/30/2021   Procedure: ESOPHAGOGASTRODUODENOSCOPY (EGD) WITH PROPOFOL ;  Surgeon: Golda Claudis PENNER, MD;  [facility];  Service: Endoscopy;  Laterality: N/A;   2:00, pt knows to arrive at 11:00   HEMOSTASIS CLIP PLACEMENT  02/17/2023   Procedure: HEMOSTASIS CLIP PLACEMENT;  Surgeon: Stacia Glendia BRAVO, MD;  [facility];  Service: Gastroenterology;;   KNEE ARTHROSCOPY Right 1979   left elbow     spur removed   left knee surgery     open surgery for a meniscus tear 1970's   lungs  07/2022   thoracentesis   PARTIAL COLECTOMY  2024   polyps   POLYPECTOMY  04/09/2016   Procedure: POLYPECTOMY;  Surgeon: Claudis PENNER Golda, MD;  [facility];  Service: Endoscopy;;  cecal polyp   POLYPECTOMY  01/30/2021   Procedure: POLYPECTOMY;  Surgeon: Golda Claudis PENNER, MD;  [facility];  Service: Endoscopy;;   POLYPECTOMY N/A 02/17/2023   Procedure: POLYPECTOMY;  Surgeon: Stacia Glendia BRAVO, MD;  Location: [facility];  Service: Gastroenterology;  Laterality: N/A;   SUBMUCOSAL LIFTING INJECTION  02/17/2023   Procedure: SUBMUCOSAL LIFTING INJECTION;  Surgeon: Stacia Glendia BRAVO, MD;  Location: [facility];  Service: Gastroenterology;;   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   Procedure: LEFT TOTAL HIP ARTHROPLASTY ANTERIOR APPROACH;  Surgeon: Ernie Cough, MD;  [facility];  Service: Orthopedics;  Laterality: Left;  70 mins   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   Procedure: TOTAL KNEE ARTHROPLASTY;  Surgeon: Ernie Cough, MD;  [facility];  Service: Orthopedics;  Laterality: Left;  70 mins   UMBILICAL HERNIA REPAIR N/A 05/09/2022   Procedure: PRIMARY UMBILICAL HERNIA REPAIR;  Surgeon: Sheldon Standing, MD;  [facility];  Service: General;  Laterality: N/A;   XI ROBOTIC ASSISTED PARAESOPHAGEAL HERNIA REPAIR N/A 05/09/2022   Procedure: ROBOTIC REPAIR OF PARAESOPHAGEAL HIATAL HERNIA WITH FUNDOPLICATION;  Surgeon: Sheldon Standing, MD;  [facility];  Service: General;  Laterality: N/A;  GEN w/ERAS LOCAL     Current Outpatient Medications:    acetaminophen  (TYLENOL ) 500 MG tablet, Take 1,000 mg by mouth., Disp:  , Rfl:    amLODipine  (NORVASC ) 10 MG tablet, Take 1 tablet every day by oral route., Disp: , Rfl:    ANORO ELLIPTA  62.5-25 MCG/ACT AEPB, Inhale 1 puff into the lungs daily., Disp: , Rfl:    Cyanocobalamin 3000 MCG SUBL, Place under the tongue., Disp: , Rfl:    diphenoxylate -atropine  (LOMOTIL ) 2.5-0.025 MG tablet, Take 1-2 tablets by mouth 4 (four) times daily as needed for diarrhea or loose stools., Disp: 30 tablet, Rfl: 3   escitalopram  (LEXAPRO ) 20 MG tablet, Take 20 mg by mouth in the morning., Disp: , Rfl:    ferrous sulfate  325 (65 FE) MG tablet, Take 1 tablet every day by oral route., Disp: , Rfl:    fluticasone  (FLONASE ) 50 MCG/ACT nasal spray, Place 2 sprays into both nostrils as needed., Disp: , Rfl:    hydrALAZINE  (APRESOLINE ) 100 MG tablet, Take 100 mg by mouth 3 (three) times daily., Disp: , Rfl:    HYDROcodone -acetaminophen  (NORCO) 10-325 MG tablet, Take by mouth., Disp: , Rfl:  hydroxychloroquine  (PLAQUENIL ) 200 MG tablet, Take 200 mg by mouth 2 (two) times daily., Disp: , Rfl:    ketoconazole (NIZORAL) 2 % shampoo, Apply 1 Application topically every other day. Every other shower, Disp: , Rfl:    levocetirizine (XYZAL) 5 MG tablet, Take 10 mg by mouth daily., Disp: , Rfl:    LORazepam  (ATIVAN ) 2 MG tablet, Take 2 mg by mouth in the morning and at bedtime., Disp: , Rfl:    meclizine  (ANTIVERT ) 25 MG tablet, Take 25 mg by mouth 3 (three) times daily as needed for dizziness or nausea., Disp: , Rfl:    metoCLOPramide  (REGLAN ) 5 MG tablet, Take by mouth as needed., Disp: , Rfl:    Multiple Vitamins-Minerals (MENS 50+ MULTIVITAMIN PO), , Disp: , Rfl:    ondansetron  (ZOFRAN ) 4 MG tablet, Take 4 mg by mouth every 6 (six) hours as needed., Disp: , Rfl:    SIMETHICONE  PO, Take by mouth., Disp: , Rfl:    buPROPion  (WELLBUTRIN  XL) 300 MG 24 hr tablet, Take 1 tablet (300 mg total) by mouth in the morning., Disp: 30 tablet, Rfl: 1   methocarbamol  (ROBAXIN ) 500 MG tablet, Take 1 tablet (500 mg  total) by mouth every 8 (eight) hours as needed for muscle spasms., Disp: 90 tablet, Rfl: 3  Allergies  Allergen Reactions   Erythromycin Hives, Nausea And Vomiting and Nausea Only   Other Anaphylaxis    Rumicade   Remicade [Infliximab] Anaphylaxis   Sulfa Antibiotics Anaphylaxis, Diarrhea and Nausea And Vomiting   Grass Pollen(K-O-R-T-Swt Vern) Other (See Comments)    Seasonal allergies   Pollen Extract Other (See Comments)   Sulfasalazine Nausea And Vomiting and Other (See Comments)    sulfasalazine   ROS neg/noncontributory except as noted HPI/below      Objective:     BP (!) 160/88 (BP Location: Left Arm, Patient Position: Sitting, Cuff Size: Large)   Pulse 76   Temp 97.9 F (36.6 C) (Temporal)   Resp 18   Ht 5' 6 (1.676 m)   Wt 185 lb 2 oz (84 kg)   SpO2 96%   BMI 29.88 kg/m  Wt Readings from Last 3 Encounters:  10/30/23 185 lb 2 oz (84 kg)  09/29/23 188 lb 6 oz (85.4 kg)  06/18/23 191 lb 4 oz (86.8 kg)    Physical Exam   Gen: WDWN NAD HEENT: NCAT, conjunctiva not injected, sclera nonicteric NECK:  supple, no thyromegaly, no nodes, no carotid bruits CARDIAC: RRR, S1S2+, no murmur. DP 2+B LUNGS: CTAB. No wheezes ABDOMEN:  BS+, soft, NTND, No HSM, no masses EXT:  no edema MSK: no gross abnormalities.  NEURO: A&O x3.  CN II-XII intact.  PSYCH: normal mood. Good eye contact  Reviewed labs w/pt and wife spent reviewing notes, labs, discussing plan, etc.      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  Dispense: 30 tablet; Refill: 1  Other orders -     Methocarbamol ; Take 1 tablet (500 mg total) by mouth every 8 (eight) hours as  needed for muscle spasms.  Dispense: 90 tablet; Refill: 3  Assessment and Plan Assessment & Plan Chronic Kidney Disease Stage 4  Chronic kidney disease stage 4 with an eGFR of 25 mL/min/1.18m has worsened post-hospitalization. Increased water  intake may aid kidney function. Refer to nephrology for evaluation and management and encourage increased water  intake.  Hypertension   Hypertension is managed with amlodipine  10 mg and hydralazine . Office blood pressure is 160/88 mmHg, possibly elevated due to anxiety. Nephrology may adjust therapy due to kidney condition. Monitor home blood pressure to assess consistency of elevation and consider therapy adjustments based on home readings and nephrology input.  Rheumatoid Arthritis   Rheumatoid arthritis is managed with Plaquenil . There is concern about Plaquenil 's impact on kidney function due to the worsening condition. A rheumatology appointment is scheduled for July 11. Discuss Plaquenil 's impact on kidney function with the rheumatologist and continue Plaquenil  until further evaluation.  Depression   Depression is managed with Wellbutrin  150 mg, Lexapro  20 mg, and lorazepam  as needed. Improvement is noted with Wellbutrin , but episodes persist. Increase Wellbutrin  to 300 mg daily and monitor symptoms. Schedule a video follow-up in one month to assess effectiveness. Continue Lexapro  20 mg daily and use lorazepam  as needed, with caution due to potential side effects in older adults.  Ulcerative Colitis   Ulcerative colitis shows improved gastrointestinal symptoms. A sigmoidoscopy is awaited. Diarrhea has decreased with Lomotil  and Imodium . Complete stool sample analysis as per gastroenterologist's instructions and proceed with the scheduled sigmoidoscopy.  Back Pain   Chronic back pain is managed with methocarbamol . He prefers methocarbamol  due to fewer sedative effects. Prescribe methocarbamol  as needed and discontinue cyclobenzaprine .  General  Health Maintenance   Cholesterol levels are normal without medication.  Iron levels have improved with supplementation; hemoglobin increased from 10.3 to 12.2 g/dL. Thyroid  and glucose levels are normal. Continue iron supplementation to improve hemoglobin levels. Possible from CKD, poss UC, recovery from surgery, age, Chronic disease  Follow-up   Monitor the effectiveness of the increased Wellbutrin  dose and coordinate specialist evaluations. Schedule a video follow-up in one month for Wellbutrin  assessment and coordinate nephrology and rheumatology appointments.    Return in about 4 weeks (around 11/27/2023) for video, mood.  Jenkins CHRISTELLA Carrel, MD

## 2023-10-30 NOTE — Progress Notes (Signed)
 D/w pt and wife at appt

## 2023-10-30 NOTE — Telephone Encounter (Signed)
 Pharmacy Patient Advocate Encounter   Received notification from CoverMyMeds that prior authorization for  Methocarbamol  500MG  tablets is required/requested.   Insurance verification completed.   The patient is insured through [employer] .   Per test claim: PA required; PA submitted to above mentioned insurance via CoverMyMeds Key/confirmation #/EOC [id number] Status is pending

## 2023-10-30 NOTE — Patient Instructions (Addendum)
 It was very nice to see you today!  Referring to nephrology  -[facility]  Check blood pressures.  Drink more water    PLEASE NOTE:  If you had any lab tests please let us  know if you have not heard back within a few days. You may see your results on MyChart before we have a chance to review them but we will give you a call once they are reviewed by us . If we ordered any referrals today, please let us  know if you have not heard from their office within the next week.   Please try these tips to maintain a healthy lifestyle:  Eat most of your calories during the day when you are active. Eliminate processed foods including packaged sweets (pies, cakes, cookies), reduce intake of potatoes, white bread, white pasta, and white rice. Look for whole grain options, oat flour or almond flour.  Each meal should contain half fruits/vegetables, one quarter protein, and one quarter carbs (no bigger than a computer mouse).  Cut down on sweet beverages. This includes juice, soda, and sweet tea. Also watch fruit intake, though this is a healthier sweet option, it still contains natural sugar! Limit to 3 servings daily.  Drink at least 1 glass of water  with each meal and aim for at least 8 glasses per day  Exercise at least 150 minutes every week.

## 2023-11-01 DIAGNOSIS — N184 Chronic kidney disease, stage 4 (severe): Secondary | ICD-10-CM | POA: Insufficient documentation

## 2023-11-02 ENCOUNTER — Other Ambulatory Visit (HOSPITAL_COMMUNITY): Payer: Self-pay

## 2023-11-15 LAB — COLOGUARD: COLOGUARD: NEGATIVE

## 2023-11-23 ENCOUNTER — Other Ambulatory Visit (HOSPITAL_COMMUNITY): Payer: Self-pay

## 2023-11-27 ENCOUNTER — Ambulatory Visit: Admitting: Family Medicine

## 2023-12-01 ENCOUNTER — Telehealth: Payer: Self-pay | Admitting: Gastroenterology

## 2023-12-01 NOTE — Telephone Encounter (Signed)
 Inbound call from patient spouse inquiring if we can fill Reglan  prescription originally filled by Dr Sheldon.   [facility] in [place]   Please advise. Thank you

## 2023-12-01 NOTE — Telephone Encounter (Signed)
 Spoke with patient's wife who said that patient sometimes feels both bloated and nauseous and the Reglan  seems to help the best.  He uses Zofran  for severe nausea but the Reglan  helps the best when bloating is involved.  Please advise.

## 2023-12-02 MED ORDER — METOCLOPRAMIDE HCL 5 MG PO TABS: 5.0000 mg | ORAL_TABLET | Freq: Four times a day (QID) | ORAL | 1 refills | Status: DC | PRN

## 2023-12-02 NOTE — Telephone Encounter (Signed)
 Spoke to patient's wife and relayed the information and Reglan  and possible side effects.  She acknowledged and understood.  Sent refill to [facility]

## 2023-12-07 ENCOUNTER — Encounter: Payer: Self-pay | Admitting: Family Medicine

## 2023-12-07 ENCOUNTER — Telehealth (INDEPENDENT_AMBULATORY_CARE_PROVIDER_SITE_OTHER): Admitting: Family Medicine

## 2023-12-07 DIAGNOSIS — R1011 Right upper quadrant pain: Secondary | ICD-10-CM | POA: Diagnosis not present

## 2023-12-07 DIAGNOSIS — F419 Anxiety disorder, unspecified: Secondary | ICD-10-CM | POA: Diagnosis not present

## 2023-12-07 MED ORDER — BUPROPION HCL ER (XL) 300 MG PO TB24: 300.0000 mg | ORAL_TABLET | Freq: Every morning | ORAL | 1 refills | Status: AC

## 2023-12-07 NOTE — Patient Instructions (Addendum)
 It was very nice to see you today!  Continue the bupropion  at 300mg   Take the robaxin  and immodium regularly.     PLEASE NOTE:  If you had any lab tests please let us  know if you have not heard back within a few days. You may see your results on MyChart before we have a chance to review them but we will give you a call once they are reviewed by us . If we ordered any referrals today, please let us  know if you have not heard from their office within the next week.   Please try these tips to maintain a healthy lifestyle:  Eat most of your calories during the day when you are active. Eliminate processed foods including packaged sweets (pies, cakes, cookies), reduce intake of potatoes, white bread, white pasta, and white rice. Look for whole grain options, oat flour or almond flour.  Each meal should contain half fruits/vegetables, one quarter protein, and one quarter carbs (no bigger than a computer mouse).  Cut down on sweet beverages. This includes juice, soda, and sweet tea. Also watch fruit intake, though this is a healthier sweet option, it still contains natural sugar! Limit to 3 servings daily.  Drink at least 1 glass of water  with each meal and aim for at least 8 glasses per day  Exercise at least 150 minutes every week.

## 2023-12-07 NOTE — Progress Notes (Signed)
 MyChart Video Visit Virtual Visit via Video Note   This visit type was conducted w/patient consent. This format is felt to be most appropriate for this patient at this time. Physical exam was limited by quality of the video and audio technology used for the visit. CMA was able to get the patient set up on a video visit.  Patient location: car pulled over. Patient and provider in visit and wife Provider location: Office  I discussed the limitations of evaluation and management by telemedicine and the availability of in person appointments. The patient expressed understanding and agreed to proceed.  Visit Date: 12/07/2023  Today's healthcare provider: Jenkins CHRISTELLA Carrel, MD     Subjective:    Patient ID: Herbert Morrison, male    DOB: Aug 31, 1945, 78 y.o.   MRN: [id number]  Chief Complaint  Patient presents with   Follow-up    4 week follow-up on mood     HPI Discussed the use of AI scribe software for clinical note transcription with the patient, who gave verbal consent to proceed.  History of Present Illness Herbert Morrison is a 78 year old male with depression and chronic diarrhea who presents with persistent mood disturbances and abdominal pain. He is accompanied by his wife.  He experiences ongoing mood disturbances despite being on Lexapro  20 mg and bupropion  300 mg. He has episodes of crying and feelings of being a burden, though he denies suicidal thoughts. His current medication regimen has been in place for over a month without significant improvement. He reports a history of PTSD and recent loss of close friends, which he finds very bothersome and believes add to his anxiety.  He experiences chronic diarrhea, which he attributes to inconsistent use of Lomotil  and Imodium . Regular use of Imodium  results in normal stools, but he often forgets doses, especially midday medications like hydralazine , Imodium , and Lomotil .  He describes persistent right-sided abdominal pain radiating to  the front and up to his shoulder. The pain is alleviated temporarily by hydrocodone  or Robaxin . A past CT scan identified no kidney stone, though he questions this diagnosis. He has an upcoming nephrology appointment and has previously seen a rheumatologist.  He recalls a period a couple of weeks ago when he felt well, without pain or diarrhea, but cannot identify any changes in his routine that might have contributed to this improvement. During this time, he did not experience mood disturbances.      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   CKD (chronic kidney disease) stage 3, GFR 30-59 ml/min (HCC)    3b   Depression    Dyspnea    GERD (gastroesophageal reflux disease)    Grade I diastolic dysfunction 04/28/2023   Heart murmur    as a child   Hiatal hernia    History of hiatal hernia    Hypertension    Lupus 12/31/2020   Rheumatoid arthritis (HCC) 12/31/2020   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[facility])     Past Surgical History:  Procedure Laterality Date   BIOPSY  01/30/2021   Procedure: BIOPSY;  Surgeon: Golda Claudis PENNER, MD;  [facility];  Service: Endoscopy;;   CATARACT EXTRACTION Left 2023   COLONOSCOPY N/A 04/09/2016   Procedure: COLONOSCOPY;  Surgeon: Claudis PENNER Golda, MD;  [facility];  Service: Endoscopy;  Laterality: N/A;  1:45   COLONOSCOPY WITH  PROPOFOL  N/A 01/30/2021   Procedure: COLONOSCOPY WITH PROPOFOL ;  Surgeon: Golda Claudis PENNER, MD;  [facility];  Service: Endoscopy;  Laterality: N/A;   COLONOSCOPY WITH PROPOFOL  N/A 02/17/2023   Procedure: COLONOSCOPY WITH PROPOFOL ;  Surgeon: Stacia Glendia BRAVO, MD;  [facility];  Service: Gastroenterology;  Laterality: N/A;   ELBOW BURSA SURGERY Left 1983   ESOPHAGOGASTRODUODENOSCOPY (EGD) WITH PROPOFOL  N/A 01/30/2021   Procedure: ESOPHAGOGASTRODUODENOSCOPY (EGD) WITH PROPOFOL ;  Surgeon: Golda Claudis PENNER, MD;  [facility];  Service: Endoscopy;  Laterality: N/A;  2:00, pt knows to arrive at 11:00   HEMOSTASIS CLIP PLACEMENT  02/17/2023   Procedure: HEMOSTASIS CLIP PLACEMENT;  Surgeon: Stacia Glendia BRAVO, MD;  [facility];  Service: Gastroenterology;;   KNEE ARTHROSCOPY Right 1979   left elbow     spur removed   left knee surgery     open surgery for a meniscus tear 1970's   lungs  07/2022   thoracentesis   PARTIAL COLECTOMY  2024   polyps   POLYPECTOMY  04/09/2016   Procedure: POLYPECTOMY;  Surgeon: Claudis PENNER Golda, MD;  [facility];  Service: Endoscopy;;  cecal polyp   POLYPECTOMY  01/30/2021   Procedure: POLYPECTOMY;  Surgeon: Golda Claudis PENNER, MD;  [facility];  Service: Endoscopy;;   POLYPECTOMY N/A 02/17/2023   Procedure: POLYPECTOMY;  Surgeon: Stacia Glendia BRAVO, MD;  Location: [facility];  Service: Gastroenterology;  Laterality: N/A;   SUBMUCOSAL LIFTING INJECTION  02/17/2023   Procedure: SUBMUCOSAL LIFTING INJECTION;  Surgeon: Stacia Glendia BRAVO, MD;  Location: [facility];  Service: Gastroenterology;;   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   Procedure: LEFT TOTAL HIP ARTHROPLASTY ANTERIOR APPROACH;  Surgeon: Ernie Cough, MD;  [facility];  Service: Orthopedics;  Laterality: Left;  70 mins   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   Procedure: TOTAL KNEE ARTHROPLASTY;  Surgeon: Ernie Cough, MD;  [facility];  Service: Orthopedics;  Laterality: Left;  70 mins   UMBILICAL HERNIA REPAIR N/A 05/09/2022   Procedure: PRIMARY UMBILICAL HERNIA REPAIR;  Surgeon: Sheldon Standing, MD;  [facility];  Service: General;  Laterality: N/A;   XI ROBOTIC ASSISTED PARAESOPHAGEAL HERNIA REPAIR N/A 05/09/2022   Procedure: ROBOTIC REPAIR OF PARAESOPHAGEAL HIATAL HERNIA WITH FUNDOPLICATION;  Surgeon: Sheldon Standing, MD;  Location: [facility];  Service: General;  Laterality: N/A;  GEN w/ERAS LOCAL    Outpatient Medications Prior to  Visit  Medication Sig Dispense Refill   acetaminophen  (TYLENOL ) 500 MG tablet Take 1,000 mg by mouth.     amLODipine  (NORVASC ) 10 MG tablet Take 1 tablet every day by oral route.     ANORO ELLIPTA  62.5-25 MCG/ACT AEPB Inhale 1 puff into the lungs daily.     Cyanocobalamin 3000 MCG CAPS Take by mouth.     diphenoxylate -atropine  (LOMOTIL ) 2.5-0.025 MG tablet Take 1-2 tablets by mouth 4 (four) times daily as needed for diarrhea or loose stools. 30 tablet 3   escitalopram  (LEXAPRO ) 20 MG tablet Take 20 mg by mouth in the morning.     ferrous sulfate  325 (65 FE) MG tablet Take 1 tablet every day by oral route.     fluticasone  (FLONASE ) 50 MCG/ACT nasal spray Place 2 sprays into both nostrils as needed.     hydrALAZINE  (APRESOLINE ) 100 MG tablet Take 100 mg by mouth 3 (three) times daily.     HYDROcodone -acetaminophen  (NORCO) 10-325 MG tablet Take by mouth.     hydroxychloroquine  (PLAQUENIL ) 200 MG  tablet Take 200 mg by mouth 2 (two) times daily.     ketoconazole (NIZORAL) 2 % shampoo Apply 1 Application topically every other day. Every other shower     levocetirizine (XYZAL) 5 MG tablet Take 10 mg by mouth daily.     LORazepam  (ATIVAN ) 2 MG tablet Take 2 mg by mouth in the morning and at bedtime.     meclizine  (ANTIVERT ) 25 MG tablet Take 25 mg by mouth 3 (three) times daily as needed for dizziness or nausea.     methocarbamol  (ROBAXIN ) 500 MG tablet Take 1 tablet (500 mg total) by mouth every 8 (eight) hours as needed for muscle spasms. 90 tablet 3   metoCLOPramide  (REGLAN ) 5 MG tablet Take 1 tablet (5 mg total) by mouth every 6 (six) hours as needed for nausea. 60 tablet 1   Multiple Vitamins-Minerals (MENS 50+ MULTIVITAMIN PO)      ondansetron  (ZOFRAN ) 4 MG tablet Take 4 mg by mouth every 6 (six) hours as needed.     SIMETHICONE  PO Take by mouth.     buPROPion  (WELLBUTRIN  XL) 300 MG 24 hr tablet Take 1 tablet (300 mg total) by mouth in the morning. 30 tablet 1   Cyanocobalamin 3000 MCG SUBL  Place under the tongue. (Patient taking differently: Take by mouth.)     No facility-administered medications prior to visit.    Allergies  Allergen Reactions   Erythromycin Hives, Nausea And Vomiting, Nausea Only and Other (See Comments)   Other Anaphylaxis and Other (See Comments)    Rumicade   Remicade [Infliximab] Anaphylaxis   Sulfa Antibiotics Anaphylaxis, Diarrhea and Nausea And Vomiting   Mesalamine  Other (See Comments)   Grass Pollen(K-O-R-T-Swt Vern) Other (See Comments)    Seasonal allergies   Pollen Extract Other (See Comments)   Sulfasalazine Nausea And Vomiting and Other (See Comments)    sulfasalazine        Objective:     Physical Exam  Vitals and nursing note reviewed.  Constitutional:      General:  is not in acute distress.    Appearance: Normal appearance.  HENT:     Head: Normocephalic.  Pulmonary:     Effort: No respiratory distress.  Skin:    General: Skin is dry.     Coloration: Skin is not pale.  Neurological:     Mental Status: Pt is alert and oriented to person, place, and time.  Psychiatric:        Mood and Affect: Mood normal.   Reviewed CT report from Jan  There were no vitals taken for this visit.  Wt Readings from Last 3 Encounters:  10/30/23 185 lb 2 oz (84 kg)  09/29/23 188 lb 6 oz (85.4 kg)  06/18/23 191 lb 4 oz (86.8 kg)       Assessment & Plan:   Problem List Items Addressed This Visit     Anxiety   Relevant Medications   buPROPion  (WELLBUTRIN  XL) 300 MG 24 hr tablet   Other Visit Diagnoses       RUQ pain    -  Primary     Assessment and Plan Assessment & Plan Depression   Persistent depressive symptoms, including crying spells and feelings of being a burden, continue despite Lexapro  20 mg and bupropion  300 mg. There is no significant mood improvement, though he denies suicidal ideation but expresses hopelessness. Symptoms are exacerbated by chronic medical issues and recent losses. The current medication  regimen is at the provider's limit of  expertise, so a referral to psychiatry for further management, including potential mood stabilizers, is considered. He believes improving physical health will positively impact his mood. Continue Lexapro  20 mg and bupropion  300 mg. Pt wants to wait on Refer to psychiatry for further evaluation and management. Encourage regular medication adherence to assess impact on mood.  Chronic Diarrhea   Chronic diarrhea improves with consistent Lomotil  and Imodium  use but contributes to overall unwellness and may be linked to depressive symptoms. Inconsistent medication adherence affects symptom control, though consistent use previously improved symptoms. Encourage consistent use of Lomotil  twice daily and Imodium  three times daily. Consider setting an alarm or using a pill organizer to improve adherence.  Right-Sided Abdominal Pain   Persistent right-sided abdominal pain radiates to the front and shoulder, alleviated by Robaxin , suggesting a musculoskeletal component. Suspected kidney-related pain, but a previous CT scan showed no kidney stones. Pain does not worsen with eating. Differential includes musculoskeletal pain, gallbladder issues, or pinched nerve. Scheduled nephrology and rheumatology evaluations. Pain intensity varies, managed with Robaxin  and hydrocodone  as needed. Continue Robaxin  as needed for pain management. Follow up with nephrology and rheumatology for further evaluation. Consider abdominal ultrasound to rule out gallbladder issues if pain persists. Encourage consistent medication adherence to assess impact on pain.  Goals of Care   He desires physical improvement to enhance mood and is involved in decision-making with his wife, emphasizing management of physical symptoms for overall well-being. He is open to psychiatric evaluation if current measures are ineffective. Discuss the importance of medication adherence to improve physical symptoms and  mood.  Follow-up   Ongoing monitoring is required to assess treatment effectiveness and make necessary adjustments. Schedule a follow-up appointment in three months. Encourage messaging or calling with any symptom changes or concerns.    Meds ordered this encounter  Medications   buPROPion  (WELLBUTRIN  XL) 300 MG 24 hr tablet    Sig: Take 1 tablet (300 mg total) by mouth in the morning.    Dispense:  90 tablet    Refill:  1    I discussed the assessment and treatment plan with the patient. The patient was provided an opportunity to ask questions and all were answered. The patient agreed with the plan and demonstrated an understanding of the instructions.   The patient was advised to call back or seek an in-person evaluation if the symptoms worsen or if the condition fails to improve as anticipated.  Return in about 3 months (around 03/08/2024) for chronic follow-up.  Jenkins CHRISTELLA Carrel, MD [facility] [phone] (phone) [phone] (fax)  [facility]

## 2023-12-09 ENCOUNTER — Encounter: Payer: Self-pay | Admitting: Gastroenterology

## 2023-12-18 NOTE — Telephone Encounter (Signed)
 Pharmacy Patient Advocate Encounter  Received notification from [facility] that Prior Authorization for has been DENIED.  No reason given; No denial letter received via Fax or CMM. It has been requested and will be uploaded to the media tab once received.   PA #/Case ID/Reference #: [id number]

## 2023-12-23 ENCOUNTER — Inpatient Hospital Stay (HOSPITAL_COMMUNITY)
Admission: EM | Admit: 2023-12-23 | Discharge: 2023-12-26 | DRG: 682 | Disposition: A | Discharge status: Home / Self Care | Source: Ambulatory Visit | Attending: Family Medicine | Admitting: Family Medicine

## 2023-12-23 ENCOUNTER — Encounter (HOSPITAL_COMMUNITY): Payer: Self-pay

## 2023-12-23 ENCOUNTER — Telehealth: Payer: Self-pay | Admitting: *Deleted

## 2023-12-23 ENCOUNTER — Other Ambulatory Visit: Payer: Self-pay

## 2023-12-23 ENCOUNTER — Emergency Department (HOSPITAL_COMMUNITY)

## 2023-12-23 DIAGNOSIS — Z96642 Presence of left artificial hip joint: Secondary | ICD-10-CM | POA: Diagnosis present

## 2023-12-23 DIAGNOSIS — I129 Hypertensive chronic kidney disease with stage 1 through stage 4 chronic kidney disease, or unspecified chronic kidney disease: Secondary | ICD-10-CM | POA: Diagnosis present

## 2023-12-23 DIAGNOSIS — F419 Anxiety disorder, unspecified: Secondary | ICD-10-CM | POA: Diagnosis present

## 2023-12-23 DIAGNOSIS — E872 Acidosis, unspecified: Secondary | ICD-10-CM | POA: Diagnosis present

## 2023-12-23 DIAGNOSIS — I10 Essential (primary) hypertension: Secondary | ICD-10-CM | POA: Diagnosis present

## 2023-12-23 DIAGNOSIS — N179 Acute kidney failure, unspecified: Principal | ICD-10-CM | POA: Diagnosis present

## 2023-12-23 DIAGNOSIS — I2510 Atherosclerotic heart disease of native coronary artery without angina pectoris: Secondary | ICD-10-CM | POA: Diagnosis present

## 2023-12-23 DIAGNOSIS — Z888 Allergy status to other drugs, medicaments and biological substances status: Secondary | ICD-10-CM

## 2023-12-23 DIAGNOSIS — G9341 Metabolic encephalopathy: Secondary | ICD-10-CM | POA: Diagnosis not present

## 2023-12-23 DIAGNOSIS — E876 Hypokalemia: Secondary | ICD-10-CM | POA: Diagnosis not present

## 2023-12-23 DIAGNOSIS — M069 Rheumatoid arthritis, unspecified: Secondary | ICD-10-CM | POA: Diagnosis present

## 2023-12-23 DIAGNOSIS — Z980 Intestinal bypass and anastomosis status: Secondary | ICD-10-CM

## 2023-12-23 DIAGNOSIS — E782 Mixed hyperlipidemia: Secondary | ICD-10-CM | POA: Diagnosis present

## 2023-12-23 DIAGNOSIS — K648 Other hemorrhoids: Secondary | ICD-10-CM | POA: Diagnosis present

## 2023-12-23 DIAGNOSIS — R410 Disorientation, unspecified: Secondary | ICD-10-CM

## 2023-12-23 DIAGNOSIS — K219 Gastro-esophageal reflux disease without esophagitis: Secondary | ICD-10-CM | POA: Diagnosis present

## 2023-12-23 DIAGNOSIS — Z806 Family history of leukemia: Secondary | ICD-10-CM

## 2023-12-23 DIAGNOSIS — K621 Rectal polyp: Secondary | ICD-10-CM | POA: Diagnosis present

## 2023-12-23 DIAGNOSIS — Z860100 Personal history of colon polyps, unspecified: Secondary | ICD-10-CM

## 2023-12-23 DIAGNOSIS — F4310 Post-traumatic stress disorder, unspecified: Secondary | ICD-10-CM | POA: Diagnosis present

## 2023-12-23 DIAGNOSIS — N184 Chronic kidney disease, stage 4 (severe): Secondary | ICD-10-CM | POA: Diagnosis present

## 2023-12-23 DIAGNOSIS — Z8052 Family history of malignant neoplasm of bladder: Secondary | ICD-10-CM

## 2023-12-23 DIAGNOSIS — Z882 Allergy status to sulfonamides status: Secondary | ICD-10-CM

## 2023-12-23 DIAGNOSIS — Z881 Allergy status to other antibiotic agents status: Secondary | ICD-10-CM

## 2023-12-23 DIAGNOSIS — M329 Systemic lupus erythematosus, unspecified: Secondary | ICD-10-CM | POA: Diagnosis present

## 2023-12-23 DIAGNOSIS — Z91048 Other nonmedicinal substance allergy status: Secondary | ICD-10-CM

## 2023-12-23 DIAGNOSIS — K5120 Ulcerative (chronic) proctitis without complications: Secondary | ICD-10-CM | POA: Diagnosis present

## 2023-12-23 DIAGNOSIS — E860 Dehydration: Secondary | ICD-10-CM | POA: Diagnosis not present

## 2023-12-23 DIAGNOSIS — Z79899 Other long term (current) drug therapy: Secondary | ICD-10-CM

## 2023-12-23 DIAGNOSIS — F32A Depression, unspecified: Secondary | ICD-10-CM | POA: Diagnosis present

## 2023-12-23 DIAGNOSIS — D631 Anemia in chronic kidney disease: Secondary | ICD-10-CM | POA: Diagnosis present

## 2023-12-23 DIAGNOSIS — I5189 Other ill-defined heart diseases: Secondary | ICD-10-CM

## 2023-12-23 DIAGNOSIS — Z96652 Presence of left artificial knee joint: Secondary | ICD-10-CM | POA: Diagnosis present

## 2023-12-23 DIAGNOSIS — Z85828 Personal history of other malignant neoplasm of skin: Secondary | ICD-10-CM

## 2023-12-23 LAB — COMPREHENSIVE METABOLIC PANEL WITH GFR
ALT: 10 U/L (ref 0–44)
AST: 13 U/L — ABNORMAL LOW (ref 15–41)
Albumin: 4 g/dL (ref 3.5–5.0)
Alkaline Phosphatase: 49 U/L (ref 38–126)
Anion gap: 8 (ref 5–15)
BUN: 48 mg/dL — ABNORMAL HIGH (ref 8–23)
CO2: 16 mmol/L — ABNORMAL LOW (ref 22–32)
Calcium: 9.5 mg/dL (ref 8.9–10.3)
Chloride: 113 mmol/L — ABNORMAL HIGH (ref 98–111)
Creatinine, Ser: 3.64 mg/dL — ABNORMAL HIGH (ref 0.61–1.24)
GFR, Estimated: 17 mL/min — ABNORMAL LOW (ref >60)
Glucose, Bld: 75 mg/dL (ref 70–99)
Potassium: 4 mmol/L (ref 3.5–5.1)
Sodium: 137 mmol/L (ref 135–145)
Total Bilirubin: 0.6 mg/dL (ref 0.0–1.2)
Total Protein: 6.8 g/dL (ref 6.5–8.1)

## 2023-12-23 LAB — CBC WITH DIFFERENTIAL/PLATELET
Abs Immature Granulocytes: 0.03 K/uL (ref 0.00–0.07)
Basophils Absolute: 0.1 K/uL (ref 0.0–0.1)
Basophils Relative: 1 %
Eosinophils Absolute: 0.2 K/uL (ref 0.0–0.5)
Eosinophils Relative: 3 %
HCT: 37.8 % — ABNORMAL LOW (ref 39.0–52.0)
Hemoglobin: 11.7 g/dL — ABNORMAL LOW (ref 13.0–17.0)
Immature Granulocytes: 0 %
Lymphocytes Relative: 13 %
Lymphs Abs: 0.9 K/uL (ref 0.7–4.0)
MCH: 28 pg (ref 26.0–34.0)
MCHC: 31 g/dL (ref 30.0–36.0)
MCV: 90.4 fL (ref 80.0–100.0)
Monocytes Absolute: 1 K/uL (ref 0.1–1.0)
Monocytes Relative: 14 %
Neutro Abs: 5 K/uL (ref 1.7–7.7)
Neutrophils Relative %: 69 %
Platelets: 293 K/uL (ref 150–400)
RBC: 4.18 MIL/uL — ABNORMAL LOW (ref 4.22–5.81)
RDW: 16.6 % — ABNORMAL HIGH (ref 11.5–15.5)
WBC: 7.2 K/uL (ref 4.0–10.5)
nRBC: 0 % (ref 0.0–0.2)

## 2023-12-23 LAB — URINALYSIS, ROUTINE W REFLEX MICROSCOPIC
Bilirubin Urine: NEGATIVE
Glucose, UA: NEGATIVE mg/dL
Hgb urine dipstick: NEGATIVE
Ketones, ur: NEGATIVE mg/dL
Leukocytes,Ua: NEGATIVE
Nitrite: NEGATIVE
Protein, ur: 30 mg/dL — AB
Specific Gravity, Urine: 1.013 (ref 1.005–1.030)
pH: 5 (ref 5.0–8.0)

## 2023-12-23 LAB — CBG MONITORING, ED: Glucose-Capillary: 90 mg/dL (ref 70–99)

## 2023-12-23 LAB — AMMONIA: Ammonia: 30 umol/L (ref 9–35)

## 2023-12-23 LAB — LIPASE, BLOOD: Lipase: 56 U/L — ABNORMAL HIGH (ref 11–51)

## 2023-12-23 MED ORDER — ONDANSETRON HCL 4 MG/2ML IJ SOLN
4.0000 mg | Freq: Four times a day (QID) | INTRAMUSCULAR | Status: DC | PRN
Start: 2023-12-23 — End: 2023-12-26

## 2023-12-23 MED ORDER — HYDROCODONE-ACETAMINOPHEN 10-325 MG PO TABS: 1.0000 | ORAL_TABLET | Freq: Four times a day (QID) | ORAL | Status: DC | PRN

## 2023-12-23 MED ORDER — AMLODIPINE BESYLATE 5 MG PO TABS
10.0000 mg | ORAL_TABLET | Freq: Every day | ORAL | Status: DC
  Administered 2023-12-24 – 2023-12-26 (×3): 10 mg via ORAL
  Filled 2023-12-23 (×3): qty 2

## 2023-12-23 MED ORDER — DIPHENOXYLATE-ATROPINE 2.5-0.025 MG PO TABS: 1.0000 | ORAL_TABLET | Freq: Four times a day (QID) | ORAL | Status: DC | PRN

## 2023-12-23 MED ORDER — FERROUS SULFATE 325 (65 FE) MG PO TABS
325.0000 mg | ORAL_TABLET | Freq: Every day | ORAL | Status: DC
  Administered 2023-12-24 – 2023-12-26 (×3): 325 mg via ORAL
  Filled 2023-12-23 (×3): qty 1

## 2023-12-23 MED ORDER — SODIUM CHLORIDE 0.9 % IV BOLUS
1000.0000 mL | Freq: Once | INTRAVENOUS | Status: AC
  Administered 2023-12-23 (×2): 1000 mL via INTRAVENOUS

## 2023-12-23 MED ORDER — ONDANSETRON HCL 4 MG PO TABS: 4.0000 mg | ORAL_TABLET | Freq: Four times a day (QID) | ORAL | Status: DC | PRN

## 2023-12-23 MED ORDER — LORAZEPAM 0.5 MG PO TABS
0.5000 mg | ORAL_TABLET | Freq: Two times a day (BID) | ORAL | Status: DC | PRN
  Administered 2023-12-25: 0.5 mg via ORAL
  Filled 2023-12-23: qty 1

## 2023-12-23 MED ORDER — HYDROXYCHLOROQUINE SULFATE 200 MG PO TABS
200.0000 mg | ORAL_TABLET | Freq: Two times a day (BID) | ORAL | Status: DC
  Administered 2023-12-23 – 2023-12-26 (×7): 200 mg via ORAL
  Filled 2023-12-23 (×8): qty 1

## 2023-12-23 MED ORDER — ACETAMINOPHEN 650 MG RE SUPP: 650.0000 mg | Freq: Four times a day (QID) | RECTAL | Status: DC | PRN

## 2023-12-23 MED ORDER — SODIUM BICARBONATE 8.4 % IV SOLN
INTRAVENOUS | Status: DC
  Filled 2023-12-23 (×2): qty 1000

## 2023-12-23 MED ORDER — FENTANYL CITRATE (PF) 100 MCG/2ML IJ SOLN: 12.5000 ug | INTRAMUSCULAR | Status: DC | PRN

## 2023-12-23 MED ORDER — ESCITALOPRAM OXALATE 10 MG PO TABS
20.0000 mg | ORAL_TABLET | Freq: Every morning | ORAL | Status: DC
  Administered 2023-12-24 – 2023-12-26 (×3): 20 mg via ORAL
  Filled 2023-12-23 (×3): qty 2

## 2023-12-23 MED ORDER — HEPARIN SODIUM (PORCINE) 5000 UNIT/ML IJ SOLN
5000.0000 [IU] | Freq: Three times a day (TID) | INTRAMUSCULAR | Status: AC
  Administered 2023-12-23 – 2023-12-24 (×5): 5000 [IU] via SUBCUTANEOUS
  Filled 2023-12-23 (×4): qty 1

## 2023-12-23 MED ORDER — BUPROPION HCL ER (XL) 300 MG PO TB24
300.0000 mg | ORAL_TABLET | Freq: Every morning | ORAL | Status: DC
  Administered 2023-12-24 – 2023-12-26 (×3): 300 mg via ORAL
  Filled 2023-12-23 (×3): qty 1

## 2023-12-23 MED ORDER — SODIUM BICARBONATE 8.4 % IV SOLN
INTRAVENOUS | Status: AC
Start: 2023-12-23 — End: 2023-12-23
  Filled 2023-12-23: qty 150

## 2023-12-23 MED ORDER — ACETAMINOPHEN 325 MG PO TABS
650.0000 mg | ORAL_TABLET | Freq: Four times a day (QID) | ORAL | Status: DC | PRN
  Administered 2023-12-25 (×2): 650 mg via ORAL
  Filled 2023-12-23 (×2): qty 2

## 2023-12-23 MED ORDER — HYDRALAZINE HCL 50 MG PO TABS
100.0000 mg | ORAL_TABLET | Freq: Three times a day (TID) | ORAL | Status: DC
  Administered 2023-12-24 – 2023-12-26 (×7): 100 mg via ORAL
  Filled 2023-12-23 (×8): qty 2

## 2023-12-23 MED ORDER — LORATADINE 10 MG PO TABS
10.0000 mg | ORAL_TABLET | Freq: Every day | ORAL | Status: DC
  Administered 2023-12-24 – 2023-12-26 (×3): 10 mg via ORAL
  Filled 2023-12-23 (×3): qty 1

## 2023-12-23 MED ORDER — TRAZODONE HCL 50 MG PO TABS
25.0000 mg | ORAL_TABLET | Freq: Every evening | ORAL | Status: DC | PRN
  Administered 2023-12-25: 25 mg via ORAL
  Filled 2023-12-23: qty 1

## 2023-12-23 NOTE — Telephone Encounter (Signed)
 Copied from CRM [id number]. Topic: General - Other >> Dec 23, 2023  9:03 AM Pinkey ORN wrote: Reason for CRM: Message For Dr. Wendolyn >> Dec 23, 2023  9:05 AM Pinkey ORN wrote: Wife states patient hasn't been feeling well for the last few weeks. Lolita also states he's been in the bed for 3 days now and starting to hallucinate. Patient is also starting to slur his words together. Patient's wife is urgently requesting a call back [phone]  Spoke to wife and she see stated that she think patient maybe have a UTI due to memory issues,forgetting how to use remote, burning food in the microwave because he forgot about it, walking and talking like he is drunk. Patient has appointment with nephrologist on Monday, wanted to know if pcp thought he should be seen sooner for possible testing.

## 2023-12-23 NOTE — ED Provider Notes (Signed)
 [place] EMERGENCY DEPARTMENT AT [facility] Provider Note   CSN: [id number] Arrival date & time: 12/23/23  1412     Patient presents with: Flank Pain and Altered Mental Status   SALBADOR Morrison is a 78 y.o. male.  78 year old male presents to ED with wife for episode of altered mental status and incomplete emptying while urinating.  Patient has a significant history of lupus, hypertension, ulcerative colitis, GERD, depression, and stage IV kidney disease.  Family reports episode of altered mental status happened last night at 7:30 PM and has resolved since this morning with no focal deficits.  And incomplete emptying of bladder has been an issue for several days.  Family advises that patient did not have any focal deficits last night during the incident but acted confused by putting things in the microwave that did not belong and had a shuffling gait.  He also had slurred speech.  Patient has been very tired in the last couple weeks and spent majority of his time in bed.  Patient denies any recent illnesses, fever sick contacts, or travel.  Patient has recently been diagnosed with stage IV kidney disease and is seeing nephrologist next week.     Prior to Admission medications   Medication Sig Start Date End Date Taking? Authorizing Provider  acetaminophen  (TYLENOL ) 500 MG tablet Take 1,000 mg by mouth every 6 (six) hours as needed for mild pain (pain score 1-3).   Yes [provider]  amLODipine  (NORVASC ) 10 MG tablet Take 1 tablet every day by oral route.   Yes [provider]  buPROPion  (WELLBUTRIN  XL) 300 MG 24 hr tablet Take 1 tablet (300 mg total) by mouth in the morning. 12/07/23  Yes Wendolyn Jenkins Jansky, MD  Cyanocobalamin 3000 MCG CAPS Take 1 capsule by mouth daily.   Yes [provider]  diphenoxylate -atropine  (LOMOTIL ) 2.5-0.025 MG tablet Take 1-2 tablets by mouth 4 (four) times daily as needed for diarrhea or loose stools. Patient taking  differently: Take 1 tablet by mouth in the morning, at noon, and at bedtime. 06/18/23  Yes Stacia Glendia BRAVO, MD  escitalopram  (LEXAPRO ) 20 MG tablet Take 20 mg by mouth in the morning.   Yes [provider]  ferrous sulfate  325 (65 FE) MG tablet Take 1 tablet every day by oral route.   Yes [provider]  fluticasone  (FLONASE ) 50 MCG/ACT nasal spray Place 2 sprays into both nostrils as needed for allergies or rhinitis.   Yes [provider]  hydrALAZINE  (APRESOLINE ) 100 MG tablet Take 100 mg by mouth 3 (three) times daily.   Yes [provider]  HYDROcodone -acetaminophen  (NORCO) 10-325 MG tablet Take 1 tablet by mouth every 6 (six) hours as needed for moderate pain (pain score 4-6). 09/11/23  Yes [provider]  hydroxychloroquine  (PLAQUENIL ) 200 MG tablet Take 200 mg by mouth 2 (two) times daily.   Yes [provider]  Inulin (FIBER CHOICE PO) Take 1 capsule by mouth daily.   Yes [provider]  ketoconazole  (NIZORAL ) 2 % shampoo Apply 1 Application topically every other day. Every other shower   Yes [provider]  levocetirizine (XYZAL) 5 MG tablet Take 10 mg by mouth daily.   Yes [provider]  LORazepam  (ATIVAN ) 2 MG tablet Take 2 mg by mouth in the morning and at bedtime.   Yes [provider]  meclizine  (ANTIVERT ) 25 MG tablet Take 25 mg by mouth 3 (three) times daily as needed (vertigo).  Yes [provider]  methocarbamol  (ROBAXIN ) 500 MG tablet Take 1 tablet (500 mg total) by mouth every 8 (eight) hours as needed for muscle spasms. 10/30/23  Yes Wendolyn Jenkins Jansky, MD  metoCLOPramide  (REGLAN ) 5 MG tablet Take 1 tablet (5 mg total) by mouth every 6 (six) hours as needed for nausea. Patient taking differently: Take 5 mg by mouth every 6 (six) hours as needed (gas/nausea). 12/02/23  Yes Stacia Glendia BRAVO, MD  ondansetron  (ZOFRAN ) 4 MG tablet Take 4 mg by mouth every 8 (eight) hours as needed  for vomiting or nausea.   Yes [provider]  Probiotic Product (PROBIOTIC ADVANCED PO) Take 1 capsule by mouth daily.   Yes [provider]  SIMETHICONE  PO Take 1 tablet by mouth in the morning, at noon, and at bedtime.   Yes [provider]    Allergies: Other, Remicade [infliximab], Sulfa antibiotics, Erythromycin, Mesalamine , Pollen extract, Sulfasalazine, and Grass pollen([id number] vern)    Review of Systems  Genitourinary:  Positive for flank pain.  Psychiatric/Behavioral:  Positive for confusion.   All other systems reviewed and are negative.   Updated Vital Signs BP 131/82 (BP Location: Left Arm)   Pulse (!) 52   Temp (!) 97.4 F (36.3 C)   Resp 16   Ht 5' 5 (1.651 m)   Wt 80.5 kg   SpO2 99%   BMI 29.53 kg/m   Physical Exam Vitals and nursing note reviewed.  Constitutional:      General: He is not in acute distress.    Appearance: Normal appearance. He is not ill-appearing.  HENT:     Head: Normocephalic and atraumatic.  Eyes:     Extraocular Movements: Extraocular movements intact.     Pupils: Pupils are equal, round, and reactive to light.  Cardiovascular:     Rate and Rhythm: Bradycardia present.  Pulmonary:     Effort: Pulmonary effort is normal. No respiratory distress.     Breath sounds: Normal breath sounds.  Abdominal:     Tenderness: There is abdominal tenderness. There is no right CVA tenderness, left CVA tenderness or guarding.  Musculoskeletal:        General: Normal range of motion.     Cervical back: Normal range of motion.  Skin:    General: Skin is warm and dry.  Neurological:     General: No focal deficit present.     Mental Status: He is alert. He is disoriented.  Psychiatric:        Mood and Affect: Mood normal.     (all labs ordered are listed, but only abnormal results are displayed) Labs Reviewed  URINALYSIS, ROUTINE W REFLEX MICROSCOPIC - Abnormal; Notable for the following components:      Result  Value   Protein, ur 30 (*)    Bacteria, UA RARE (*)    All other components within normal limits  CBC WITH DIFFERENTIAL/PLATELET - Abnormal; Notable for the following components:   RBC 4.18 (*)    Hemoglobin 11.7 (*)    HCT 37.8 (*)    RDW 16.6 (*)    All other components within normal limits  COMPREHENSIVE METABOLIC PANEL WITH GFR - Abnormal; Notable for the following components:   Chloride 113 (*)    CO2 16 (*)    BUN 48 (*)    Creatinine, Ser 3.64 (*)    AST 13 (*)    GFR, Estimated 17 (*)    All other components within normal limits  LIPASE,  BLOOD - Abnormal; Notable for the following components:   Lipase 56 (*)    All other components within normal limits  AMMONIA  RENAL FUNCTION PANEL  CBC  HEMOGLOBIN A1C  LIPID PANEL  CBG MONITORING, ED    EKG: EKG Interpretation Date/Time:  Wednesday December 23 2023 14:49:55 EDT Ventricular Rate:  54 PR Interval:  259 QRS Duration:  146 QT Interval:  508 QTC Calculation: 482 R Axis:   -78  Text Interpretation: Sinus rhythm Prolonged PR interval Nonspecific IVCD with LAD Anteroseptal infarct, old Confirmed by Cleotilde Rogue ([id number]) on 12/23/2023 4:03:10 PM  Radiology: CT Head Wo Contrast Result Date: 12/23/2023 EXAM: CT HEAD WITHOUT CONTRAST 12/23/2023 04:44:55 PM TECHNIQUE: CT of the head was performed without the administration of intravenous contrast. Automated exposure control, iterative reconstruction, and/or weight based adjustment of the mA/kV was utilized to reduce the radiation dose to as low as reasonably achievable. COMPARISON: 04/26/2023 CLINICAL HISTORY: Mental status change, unknown cause. FINDINGS: BRAIN AND VENTRICLES: No acute hemorrhage. Gray-white differentiation is preserved. No hydrocephalus. No extra-axial collection. No mass effect or midline shift. Mild periventricular white matter disease. ORBITS: No acute abnormality. SINUSES: No acute abnormality. SOFT TISSUES AND SKULL: No acute soft tissue abnormality. No  skull fracture. IMPRESSION: 1. No acute intracranial abnormality. 2. Mild periventricular white matter disease. Electronically signed by: evalene coho 12/23/2023 06:00 PM EDT RP Workstation: [id number]     Procedures   Medications Ordered in the ED  HYDROcodone -acetaminophen  (NORCO) 10-325 MG per tablet 1 tablet (has no administration in time range)  hydroxychloroquine  (PLAQUENIL ) tablet 200 mg (has no administration in time range)  amLODipine  (NORVASC ) tablet 10 mg (has no administration in time range)  hydrALAZINE  (APRESOLINE ) tablet 100 mg (has no administration in time range)  buPROPion  (WELLBUTRIN  XL) 24 hr tablet 300 mg (has no administration in time range)  escitalopram  (LEXAPRO ) tablet 20 mg (has no administration in time range)  LORazepam  (ATIVAN ) tablet 0.5 mg (has no administration in time range)  ferrous sulfate  tablet 325 mg (has no administration in time range)  loratadine  (CLARITIN ) tablet 10 mg (has no administration in time range)  heparin  injection 5,000 Units (has no administration in time range)  sodium bicarbonate  150 mEq in dextrose  5 % 1,150 mL infusion ( Intravenous New Bag/Given 12/23/23 1956)  acetaminophen  (TYLENOL ) tablet 650 mg (has no administration in time range)    Or  acetaminophen  (TYLENOL ) suppository 650 mg (has no administration in time range)  fentaNYL  (SUBLIMAZE ) injection 12.5 mcg (has no administration in time range)  traZODone  (DESYREL ) tablet 25 mg (has no administration in time range)  ondansetron  (ZOFRAN ) tablet 4 mg (has no administration in time range)    Or  ondansetron  (ZOFRAN ) injection 4 mg (has no administration in time range)  diphenoxylate -atropine  (LOMOTIL ) 2.5-0.025 MG per tablet 1-2 tablet (has no administration in time range)  sodium chloride  0.9 % bolus 1,000 mL (1,000 mLs Intravenous New Bag/Given 12/23/23 1632)    78 y.o. male presents to the ED for concern of Flank Pain and Altered Mental Status     This involves an  extensive number of treatment options, and is a complaint that carries with it a high risk of complications and morbidity.  The emergent differential diagnosis prior to evaluation includes, but is not limited to: TIA, CVA, UTI, AKI  This is not an exhaustive differential.   Past Medical History / Co-morbidities / Social History: Hx of hypertension, CKD, ulcerative colitis, depression, lupus Social Determinants of Health include: None reported  Additional History:  Obtained by chart review.  Notably hiatal hernia surgery, resected bowel, hip replacement, hypertension management with PCP, depression.  Lab Tests: I ordered, and personally interpreted labs.  The pertinent results include:   AKI with increased creatinine and decreased GFR increased lipase but not significant.  Mild anemia.  Imaging Studies: I ordered imaging studies including CT of the head without contrast.   I independently visualized and interpreted imaging which showed no acute process with mild periventricular white matter disease I agree with the radiologist interpretation.  Cardiac Monitoring: The patient was maintained on a cardiac monitor.  I personally viewed and interpreted the cardiac monitored which showed an underlying rhythm of: Sinus rhythm EKG reviewed by attending.  ED Course / Critical Interventions: Pt ill-appearing on exam sitting comfortable in ED bed.  On initial evaluation patient is not oriented to date or time but is oriented to place and event.  According to family this is not baseline.  Patient has no pain to palpation in abdomen.  No deficits on neuroexam and no weakness in any extremity.  Patient has clear lungs auscultation all fields.  Patient is able to talk in full sentences and communicate per His baseline but still not oriented to time and day.  Patient is not warm to the touch and does not appear infectious.  Bladder scan resulted in 65 mL and obstruction is not suspected.  Upon reevaluation,  patient was determined to have an AKI and has follow-up with nephrology next week for known chronic kidney disease.  Patient was started on IV fluids and after discussion with attending decided to admit to hospitalist.  Family agrees with treatment plan.  Hospitalist accepted transfer of care. I have reviewed the patients home medicines and have made adjustments as needed.  Disposition: Considered admission and after reviewing the patient's encounter today, I feel that the patient would benefit from admission for AKI.  Discussed course of treatment with the patient, whom demonstrated understanding.  Patient in agreement and has no further questions.    I discussed this case with my attending, Dr. Cleotilde, who agreed with the proposed treatment course and cosigned this note including patient's presenting symptoms, physical exam, and planned diagnostics and interventions.  Attending physician stated agreement with plan or made changes to plan which were implemented.     This chart was dictated using voice recognition software.  Despite best efforts to proofread, errors can occur which can change the documentation meaning.   Final diagnoses:  AKI (acute kidney injury) [facility])  Disorientation    ED Discharge Orders     None          Myriam Fonda GORMAN DEVONNA 12/23/23 2022    Cleotilde Rogue, MD 12/24/23 [phone]

## 2023-12-23 NOTE — ED Notes (Signed)
 Pt ambulated to the bathroom with spouse and standby assist with this RN

## 2023-12-23 NOTE — ED Provider Notes (Signed)
 This patient is a elderly 78 year old male with a history of lupus and hypertension presenting with a complaint of increasing fatigue and weakness which has been going on for quite some time but more rapidly accelerating over the last 24 hours.  There are times when he does not even want to get out of bed over the last few months and times where he does but in the last 24 hours he has started to stumble when he walks, have difficulty slurring his words, hallucinating and feeling generally weak.  Evidently there has been a complaint of some urinary symptoms but no fevers no vomiting no shortness of breath.  The patient on my exam is slightly bradycardic, heart rate is around 54 bpm, EKG shows a intraventricular conduction delay which is likely an incomplete left bundle branch block.  There is no other arrhythmias or ischemia seen.  His abdomen is soft and nontender with a very small amount of urine in the bladder, his mucous membranes appear slightly dry but he is able to follow some simple commands.  He does appear to have some difficulty knowing date and time though he does know where he is and he knows his wife is in the room.  Will need labs, imaging, source of altered mental status will need to be evaluated.  D/w Dr. Vicci who will admit   Cleotilde Rogue, MD 12/23/23 [phone]

## 2023-12-23 NOTE — Plan of Care (Signed)

## 2023-12-23 NOTE — Telephone Encounter (Signed)
 Wife notified of message below and stated she would go to [facility] since it is closer.

## 2023-12-23 NOTE — Group Note (Deleted)
 Date:  12/23/2023 Time:  2:22 PM  Group Topic/Focus:  Wellness Toolbox:   The focus of this group is to discuss various aspects of wellness, balancing those aspects and exploring ways to increase the ability to experience wellness.  Patients will create a wellness toolbox for use upon discharge.     Participation Level:  {BHH PARTICIPATION OZCZO:77735}  Participation Quality:  {BHH PARTICIPATION QUALITY:22265}  Affect:  {BHH AFFECT:22266}  Cognitive:  {BHH COGNITIVE:22267}  Insight: {BHH Insight2:20797}  Engagement in Group:  {BHH ENGAGEMENT IN HMNLE:77731}  Modes of Intervention:  {BHH MODES OF INTERVENTION:22269}  Additional Comments:  ***  Myra Curtistine BROCKS 12/23/2023, 2:22 PM

## 2023-12-23 NOTE — ED Triage Notes (Signed)
 Pt reports having right side flank pain with dysuria since yesterday.  Pt wife reports yesterday pt was talking strangely, forgot how to use the remote, put pop tarts in the oven and walked off and forgot them, and is seeing people that aren't there.  Wife reports PCP told them to go to [facility] or ER.

## 2023-12-23 NOTE — Hospital Course (Addendum)
 78 year old male with ulcerative colitis, hypertension, rheumatoid arthritis, stage IV CKD, chronic uncontrolled diarrhea managed with Lomotil  and Imodium , depression and anxiety on multiple medications, back pain presented to the ED by referral from PCP for symptoms of right-sided flank pain and dysuria since yesterday and having confusion at home with delirium.  Wife says he has been seeing people that are not present.  He forgot how to use the remote control.  He has been generally more weak and having difficulty getting out of bed in the last 24 hours.  He has also had some slurring speech and difficulty walking.  In the ED he was noted to be very dry with dry mucous membranes and his bladder showed a very minimal amount of urine.  He was given IV fluids.  His creatinine seems to be more elevated than normal with an elevated BUN level.  His ammonia level was within normal limits.  His CT head without contrast was within normal limits.  His urinalysis did not show evidence of infection.  Admission was requested for further management of acute metabolic encephalopathy secondary to acute on chronic renal failure.

## 2023-12-23 NOTE — H&P (Addendum)
 History and Physical  [facility]  Herbert Morrison FMW:[id number] DOB: 07-05-1945 DOA: 12/23/2023  PCP: Wendolyn Jenkins Jansky, MD  Patient coming from: Home  Level of care: Telemetry  I have personally briefly reviewed patient's old medical records in [facility]  Chief Complaint: altered mental state  HPI: Herbert Morrison is a 78 year old male with ulcerative colitis, hypertension, rheumatoid arthritis, stage IV CKD, chronic uncontrolled diarrhea managed with Lomotil  and Imodium , depression and anxiety on multiple medications, back pain presented to the ED by referral from PCP for symptoms of right-sided flank pain and dysuria since yesterday and having confusion at home with delirium.  Wife says he has been seeing people that are not present.  He forgot how to use the remote control.  He has been generally more weak and having difficulty getting out of bed in the last 24 hours.  He has also had some slurring speech and difficulty walking.  In the ED he was noted to be very dry with dry mucous membranes and his bladder showed a very minimal amount of urine.  He was given IV fluids.  His creatinine seems to be more elevated than normal with an elevated BUN level.  His ammonia level was within normal limits.  His CT head without contrast was within normal limits.  His urinalysis did not show evidence of infection.  Admission was requested for further management of acute metabolic encephalopathy secondary to acute on chronic renal failure.   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   CKD (chronic kidney disease) stage 3, GFR 30-59 ml/min (HCC)    3b   Depression    Dyspnea    GERD (gastroesophageal reflux disease)    Grade I diastolic dysfunction 04/28/2023   Heart murmur    as a child   Hiatal hernia    History of hiatal hernia    Hypertension    Lupus 12/31/2020   Rheumatoid arthritis (HCC)  12/31/2020   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[facility])     Past Surgical History:  Procedure Laterality Date   BIOPSY  01/30/2021   Procedure: BIOPSY;  Surgeon: Golda Claudis PENNER, MD;  [facility];  Service: Endoscopy;;   CATARACT EXTRACTION Left 2023   COLONOSCOPY N/A 04/09/2016   Procedure: COLONOSCOPY;  Surgeon: Claudis PENNER Golda, MD;  [facility];  Service: Endoscopy;  Laterality: N/A;  1:45   COLONOSCOPY WITH PROPOFOL  N/A 01/30/2021   Procedure: COLONOSCOPY WITH PROPOFOL ;  Surgeon: Golda Claudis PENNER, MD;  [facility];  Service: Endoscopy;  Laterality: N/A;   COLONOSCOPY WITH PROPOFOL  N/A 02/17/2023   Procedure: COLONOSCOPY WITH PROPOFOL ;  Surgeon: Stacia Glendia BRAVO, MD;  Location: [facility];  Service: Gastroenterology;  Laterality: N/A;   ELBOW BURSA SURGERY Left 1983   ESOPHAGOGASTRODUODENOSCOPY (EGD) WITH PROPOFOL  N/A 01/30/2021   Procedure: ESOPHAGOGASTRODUODENOSCOPY (EGD) WITH PROPOFOL ;  Surgeon: Golda Claudis PENNER, MD;  [facility];  Service: Endoscopy;  Laterality: N/A;  2:00, pt knows to arrive at 11:00   HEMOSTASIS CLIP PLACEMENT  02/17/2023   Procedure: HEMOSTASIS CLIP PLACEMENT;  Surgeon: Stacia Glendia BRAVO, MD;  [facility];  Service: Gastroenterology;;   KNEE ARTHROSCOPY Right 1979   left elbow     spur removed   left knee surgery     open surgery for a meniscus tear 1970's   lungs  07/2022   thoracentesis   PARTIAL COLECTOMY  2024   polyps   POLYPECTOMY  04/09/2016   Procedure: POLYPECTOMY;  Surgeon: Claudis RAYMOND Rivet, MD;  [facility];  Service: Endoscopy;;  cecal polyp   POLYPECTOMY  01/30/2021   Procedure: POLYPECTOMY;  Surgeon: Rivet Claudis RAYMOND, MD;  [facility];  Service: Endoscopy;;   POLYPECTOMY N/A 02/17/2023   Procedure: POLYPECTOMY;  Surgeon: Stacia Glendia BRAVO, MD;  Location: [facility];  Service: Gastroenterology;  Laterality: N/A;   SUBMUCOSAL LIFTING INJECTION  02/17/2023    Procedure: SUBMUCOSAL LIFTING INJECTION;  Surgeon: Stacia Glendia BRAVO, MD;  Location: [facility];  Service: Gastroenterology;;   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   Procedure: LEFT TOTAL HIP ARTHROPLASTY ANTERIOR APPROACH;  Surgeon: Ernie Cough, MD;  [facility];  Service: Orthopedics;  Laterality: Left;  70 mins   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   Procedure: TOTAL KNEE ARTHROPLASTY;  Surgeon: Ernie Cough, MD;  [facility];  Service: Orthopedics;  Laterality: Left;  70 mins   UMBILICAL HERNIA REPAIR N/A 05/09/2022   Procedure: PRIMARY UMBILICAL HERNIA REPAIR;  Surgeon: Sheldon Standing, MD;  [facility];  Service: General;  Laterality: N/A;   XI ROBOTIC ASSISTED PARAESOPHAGEAL HERNIA REPAIR N/A 05/09/2022   Procedure: ROBOTIC REPAIR OF PARAESOPHAGEAL HIATAL HERNIA WITH FUNDOPLICATION;  Surgeon: Sheldon Standing, MD;  Location: [facility];  Service: General;  Laterality: N/A;  GEN w/ERAS LOCAL     reports that he quit smoking about 50 years ago. His smoking use included cigarettes. He started smoking about 55 years ago. He has a 5 pack-year smoking history. He quit smokeless tobacco use about 58 years ago.  His smokeless tobacco use included chew. He reports that he does not currently use alcohol. He reports that he does not use drugs.  Allergies  Allergen Reactions   Other Anaphylaxis and Other (See Comments)    Rumicade   Remicade [Infliximab] Anaphylaxis   Sulfa Antibiotics Anaphylaxis, Diarrhea and Nausea And Vomiting   Erythromycin Hives and Nausea And Vomiting   Mesalamine  Other (See Comments)    Unknown    Pollen Extract Other (See Comments)    Unknown    Sulfasalazine Nausea And Vomiting   Grass Pollen(K-O-R-T-Swt Vern) Other (See Comments)    Seasonal allergies    Family History  Problem Relation Age of Onset   Leukemia Father    Bladder Cancer Father    Stomach cancer Neg Hx    Esophageal cancer Neg Hx    Colon cancer Neg Hx    Pancreatic cancer  Neg Hx    Colon polyps Neg Hx    Rectal cancer Neg Hx     Prior to Admission medications   Medication Sig Start Date End Date Taking? Authorizing Provider  acetaminophen  (TYLENOL ) 500 MG tablet Take 1,000 mg by mouth every 6 (six) hours as needed for mild pain (pain score 1-3).   Yes [provider]  amLODipine  (NORVASC ) 10 MG tablet Take 1 tablet every day by oral route.   Yes [provider]  buPROPion  (WELLBUTRIN  XL) 300 MG 24 hr tablet Take 1 tablet (300 mg total) by mouth in the morning. 12/07/23  Yes Wendolyn Jenkins Jansky, MD  Cyanocobalamin 3000 MCG CAPS Take 1 capsule by mouth daily.   Yes [provider]  diphenoxylate -atropine  (LOMOTIL ) 2.5-0.025 MG tablet Take 1-2 tablets by mouth 4 (four) times daily as needed for diarrhea or loose stools. Patient taking differently: Take 1 tablet by  mouth in the morning, at noon, and at bedtime. 06/18/23  Yes Stacia Glendia BRAVO, MD  escitalopram  (LEXAPRO ) 20 MG tablet Take 20 mg by mouth in the morning.   Yes [provider]  ferrous sulfate  325 (65 FE) MG tablet Take 1 tablet every day by oral route.   Yes [provider]  fluticasone  (FLONASE ) 50 MCG/ACT nasal spray Place 2 sprays into both nostrils as needed for allergies or rhinitis.   Yes [provider]  hydrALAZINE  (APRESOLINE ) 100 MG tablet Take 100 mg by mouth 3 (three) times daily.   Yes [provider]  HYDROcodone -acetaminophen  (NORCO) 10-325 MG tablet Take 1 tablet by mouth every 6 (six) hours as needed for moderate pain (pain score 4-6). 09/11/23  Yes [provider]  hydroxychloroquine  (PLAQUENIL ) 200 MG tablet Take 200 mg by mouth 2 (two) times daily.   Yes [provider]  Inulin (FIBER CHOICE PO) Take 1 capsule by mouth daily.   Yes [provider]  ketoconazole  (NIZORAL ) 2 % shampoo Apply 1 Application topically every other day. Every other shower   Yes [provider]  levocetirizine (XYZAL)  5 MG tablet Take 10 mg by mouth daily.   Yes [provider]  LORazepam  (ATIVAN ) 2 MG tablet Take 2 mg by mouth in the morning and at bedtime.   Yes [provider]  meclizine  (ANTIVERT ) 25 MG tablet Take 25 mg by mouth 3 (three) times daily as needed (vertigo).   Yes [provider]  methocarbamol  (ROBAXIN ) 500 MG tablet Take 1 tablet (500 mg total) by mouth every 8 (eight) hours as needed for muscle spasms. 10/30/23  Yes Wendolyn Jenkins Jansky, MD  metoCLOPramide  (REGLAN ) 5 MG tablet Take 1 tablet (5 mg total) by mouth every 6 (six) hours as needed for nausea. Patient taking differently: Take 5 mg by mouth every 6 (six) hours as needed (gas/nausea). 12/02/23  Yes Stacia Glendia BRAVO, MD  ondansetron  (ZOFRAN ) 4 MG tablet Take 4 mg by mouth every 8 (eight) hours as needed for vomiting or nausea.   Yes [provider]  Probiotic Product (PROBIOTIC ADVANCED PO) Take 1 capsule by mouth daily.   Yes [provider]  SIMETHICONE  PO Take 1 tablet by mouth in the morning, at noon, and at bedtime.   Yes [provider]    Physical Exam: Vitals:   12/23/23 1615 12/23/23 1715 12/23/23 1745 12/23/23 1815  BP: 112/66 (!) 116/50 129/61 126/65  Pulse: (!) 51 (!) 53 (!) 49 (!) 51  Resp: 11 12 14 12   Temp:      TempSrc:      SpO2: 94% 91% 98% 97%  Weight:      Height:        Constitutional: NAD, calm, comfortable Eyes: PERRL, lids and conjunctivae normal ENMT: Mucous membranes are moist. Posterior pharynx clear of any exudate or lesions.Normal dentition.  Neck: normal, supple, no masses, no thyromegaly Respiratory: clear to auscultation bilaterally, no wheezing, no crackles. Normal respiratory effort. No accessory muscle use.  Cardiovascular: normal s1, s2 sounds, no murmurs / rubs / gallops. No extremity edema. 2+ pedal pulses. No carotid bruits.  Abdomen: no tenderness, no masses palpated. No hepatosplenomegaly. Bowel sounds positive.  Musculoskeletal:  no clubbing / cyanosis. No joint deformity upper and lower extremities. Good ROM, no contractures. Normal muscle tone.  Skin: no rashes, lesions, ulcers. No induration Neurologic: CN 2-12 grossly intact. Sensation intact, DTR normal. Strength 5/5 in all 4.  Psychiatric: Normal judgment  and insight. Alert and oriented x 3. Normal mood.   Labs on Admission: I have personally reviewed following labs and imaging studies  CBC: Recent Labs  Lab 12/23/23 1438  WBC 7.2  NEUTROABS 5.0  HGB 11.7*  HCT 37.8*  MCV 90.4  PLT 293   Basic Metabolic Panel: Recent Labs  Lab 12/23/23 1438  NA 137  K 4.0  CL 113*  CO2 16*  GLUCOSE 75  BUN 48*  CREATININE 3.64*  CALCIUM  9.5   GFR: Estimated Creatinine Clearance: 17.2 mL/min (A) (by C-G formula based on SCr of 3.64 mg/dL (H)). Liver Function Tests: Recent Labs  Lab 12/23/23 1438  AST 13*  ALT 10  ALKPHOS 49  BILITOT 0.6  PROT 6.8  ALBUMIN  4.0   Recent Labs  Lab 12/23/23 1438  LIPASE 56*   Recent Labs  Lab 12/23/23 1703  AMMONIA 30   Coagulation Profile: No results for input(s): INR, PROTIME in the last 168 hours. Cardiac Enzymes: No results for input(s): CKTOTAL, CKMB, CKMBINDEX, TROPONINI in the last 168 hours. BNP (last 3 results) No results for input(s): PROBNP in the last 8760 hours. HbA1C: No results for input(s): HGBA1C in the last 72 hours. CBG: Recent Labs  Lab 12/23/23 1534  GLUCAP 90   Lipid Profile: No results for input(s): CHOL, HDL, LDLCALC, TRIG, CHOLHDL, LDLDIRECT in the last 72 hours. Thyroid  Function Tests: No results for input(s): TSH, T4TOTAL, FREET4, T3FREE, THYROIDAB in the last 72 hours. Anemia Panel: No results for input(s): VITAMINB12, FOLATE, FERRITIN, TIBC, IRON, RETICCTPCT in the last 72 hours. Urine analysis:    Component Value Date/Time   COLORURINE YELLOW 12/23/2023 1437   APPEARANCEUR CLEAR 12/23/2023 1437   LABSPEC 1.013  12/23/2023 1437   PHURINE 5.0 12/23/2023 1437   GLUCOSEU NEGATIVE 12/23/2023 1437   HGBUR NEGATIVE 12/23/2023 1437   BILIRUBINUR NEGATIVE 12/23/2023 1437   KETONESUR NEGATIVE 12/23/2023 1437   PROTEINUR 30 (A) 12/23/2023 1437   NITRITE NEGATIVE 12/23/2023 1437   LEUKOCYTESUR NEGATIVE 12/23/2023 1437    Radiological Exams on Admission: CT Head Wo Contrast Result Date: 12/23/2023 EXAM: CT HEAD WITHOUT CONTRAST 12/23/2023 04:44:55 PM TECHNIQUE: CT of the head was performed without the administration of intravenous contrast. Automated exposure control, iterative reconstruction, and/or weight based adjustment of the mA/kV was utilized to reduce the radiation dose to as low as reasonably achievable. COMPARISON: 04/26/2023 CLINICAL HISTORY: Mental status change, unknown cause. FINDINGS: BRAIN AND VENTRICLES: No acute hemorrhage. Gray-white differentiation is preserved. No hydrocephalus. No extra-axial collection. No mass effect or midline shift. Mild periventricular white matter disease. ORBITS: No acute abnormality. SINUSES: No acute abnormality. SOFT TISSUES AND SKULL: No acute soft tissue abnormality. No skull fracture. IMPRESSION: 1. No acute intracranial abnormality. 2. Mild periventricular white matter disease. Electronically signed by: evalene coho 12/23/2023 06:00 PM EDT RP Workstation: [id number]    EKG: Independently reviewed. IVCD  Assessment/Plan Principal Problem:   AKI (acute kidney injury) (HCC) Active Problems:   Metabolic encephalopathy   Essential hypertension   GERD (gastroesophageal reflux disease)   S/P left TKA   Anxiety   PTSD (post-traumatic stress disorder)   Rheumatoid arthritis (HCC)   Lupus (systemic lupus erythematosus) (HCC)   Coronary arteriosclerosis   Mixed hyperlipidemia   Grade I diastolic dysfunction   Stage 4 chronic kidney disease (HCC)   Dehydration    AKI on CKD stage IV -- some prerenal component to this as he has chronic diarrhea and is  clinically very dehydrated -- continue IV fluid  hydration  -- daily renal function panel -- check renal US    Metabolic Acidosis -- sodium bicarbonate  IV fluids ordered -- recheck renal function panel in AM   Dehydration  -- IV fluids ordered and was bolused in ED with IV fluid -- monitoring intake/output   Acute metabolic Encephalopathy -- continue neuro checks -- CT head negative for acute findings -- treating AKI on CKD as above  -- if persists, would get MRI brain -- delirium precautions -- greatly reduce and / or eliminate sedating medications  Essential hypertension  -- resume home BP lowering medications  Anemia in CKD -- resumed home daily iron supplementation   Depression/Anxiety  PTSD -- resumed home behavioral health medications   Rheumatoid Arthritis Lupus  -- resume home plaquenil    Ulcerative Colitis Chronic uncontrolled Diarrhea -- Lomotil  ordered PRN -- likely is the reason he is so dehydrated -- will ask GI to assist with getting diarrhea control -- wife reports most of colon has already been removed   Hyperlipidemia CAD -- check fasting lipid panel in AM    DVT prophylaxis: sq heparin    Code Status: Full   Family Communication: wife   Disposition Plan: anticipate home   Consults called: nephro  Admission status: OBV Time spent: 56 mins   Level of care: Telemetry Afton Louder MD [facility] How to contact the [facility] Attending or Consulting provider 7A - 7P or covering provider during after hours 7P -7A, for this patient?  Check the care team in [facility] and look for a) attending/consulting [facility] provider listed and b) the [facility] team listed Log into [email or web address] and use [place]'s universal password to access. If you do not have the password, please contact the hospital operator. Locate the [facility] provider you are looking for under [facility] and page to a number that you can be directly reached. If you still have difficulty reaching  the provider, please page the [facility] (Director on Call) for the Hospitalists listed on amion for assistance.   If 7PM-7AM, please contact night-coverage [email or web address] Password [facility]  12/23/2023, 6:40 PM

## 2023-12-23 NOTE — ED Notes (Signed)
 Pt ambulated to the bathroom with spouse

## 2023-12-24 ENCOUNTER — Observation Stay (HOSPITAL_COMMUNITY)

## 2023-12-24 DIAGNOSIS — Z96652 Presence of left artificial knee joint: Secondary | ICD-10-CM | POA: Diagnosis present

## 2023-12-24 DIAGNOSIS — E782 Mixed hyperlipidemia: Secondary | ICD-10-CM | POA: Diagnosis present

## 2023-12-24 DIAGNOSIS — E872 Acidosis, unspecified: Secondary | ICD-10-CM | POA: Diagnosis present

## 2023-12-24 DIAGNOSIS — E876 Hypokalemia: Secondary | ICD-10-CM | POA: Diagnosis not present

## 2023-12-24 DIAGNOSIS — N179 Acute kidney failure, unspecified: Secondary | ICD-10-CM | POA: Diagnosis present

## 2023-12-24 DIAGNOSIS — E860 Dehydration: Secondary | ICD-10-CM | POA: Diagnosis present

## 2023-12-24 DIAGNOSIS — Z9049 Acquired absence of other specified parts of digestive tract: Secondary | ICD-10-CM

## 2023-12-24 DIAGNOSIS — Z8719 Personal history of other diseases of the digestive system: Secondary | ICD-10-CM | POA: Diagnosis not present

## 2023-12-24 DIAGNOSIS — K529 Noninfective gastroenteritis and colitis, unspecified: Secondary | ICD-10-CM | POA: Diagnosis not present

## 2023-12-24 DIAGNOSIS — F4310 Post-traumatic stress disorder, unspecified: Secondary | ICD-10-CM | POA: Diagnosis present

## 2023-12-24 DIAGNOSIS — K648 Other hemorrhoids: Secondary | ICD-10-CM | POA: Diagnosis present

## 2023-12-24 DIAGNOSIS — D631 Anemia in chronic kidney disease: Secondary | ICD-10-CM | POA: Diagnosis present

## 2023-12-24 DIAGNOSIS — I2510 Atherosclerotic heart disease of native coronary artery without angina pectoris: Secondary | ICD-10-CM | POA: Diagnosis present

## 2023-12-24 DIAGNOSIS — D128 Benign neoplasm of rectum: Secondary | ICD-10-CM | POA: Diagnosis not present

## 2023-12-24 DIAGNOSIS — M069 Rheumatoid arthritis, unspecified: Secondary | ICD-10-CM | POA: Diagnosis present

## 2023-12-24 DIAGNOSIS — Z888 Allergy status to other drugs, medicaments and biological substances status: Secondary | ICD-10-CM | POA: Diagnosis not present

## 2023-12-24 DIAGNOSIS — I129 Hypertensive chronic kidney disease with stage 1 through stage 4 chronic kidney disease, or unspecified chronic kidney disease: Secondary | ICD-10-CM | POA: Diagnosis present

## 2023-12-24 DIAGNOSIS — G9341 Metabolic encephalopathy: Secondary | ICD-10-CM | POA: Diagnosis present

## 2023-12-24 DIAGNOSIS — K219 Gastro-esophageal reflux disease without esophagitis: Secondary | ICD-10-CM | POA: Diagnosis present

## 2023-12-24 DIAGNOSIS — Z882 Allergy status to sulfonamides status: Secondary | ICD-10-CM | POA: Diagnosis not present

## 2023-12-24 DIAGNOSIS — N184 Chronic kidney disease, stage 4 (severe): Secondary | ICD-10-CM | POA: Diagnosis present

## 2023-12-24 DIAGNOSIS — K621 Rectal polyp: Secondary | ICD-10-CM | POA: Diagnosis present

## 2023-12-24 DIAGNOSIS — Z980 Intestinal bypass and anastomosis status: Secondary | ICD-10-CM | POA: Diagnosis not present

## 2023-12-24 DIAGNOSIS — Z881 Allergy status to other antibiotic agents status: Secondary | ICD-10-CM | POA: Diagnosis not present

## 2023-12-24 DIAGNOSIS — M329 Systemic lupus erythematosus, unspecified: Secondary | ICD-10-CM | POA: Diagnosis present

## 2023-12-24 DIAGNOSIS — F419 Anxiety disorder, unspecified: Secondary | ICD-10-CM | POA: Diagnosis present

## 2023-12-24 DIAGNOSIS — F32A Depression, unspecified: Secondary | ICD-10-CM | POA: Diagnosis present

## 2023-12-24 DIAGNOSIS — R410 Disorientation, unspecified: Secondary | ICD-10-CM | POA: Diagnosis present

## 2023-12-24 DIAGNOSIS — Z85828 Personal history of other malignant neoplasm of skin: Secondary | ICD-10-CM | POA: Diagnosis not present

## 2023-12-24 DIAGNOSIS — K5120 Ulcerative (chronic) proctitis without complications: Secondary | ICD-10-CM | POA: Diagnosis present

## 2023-12-24 LAB — CBC
HCT: 33.1 % — ABNORMAL LOW (ref 39.0–52.0)
Hemoglobin: 10.4 g/dL — ABNORMAL LOW (ref 13.0–17.0)
MCH: 27.7 pg (ref 26.0–34.0)
MCHC: 31.4 g/dL (ref 30.0–36.0)
MCV: 88 fL (ref 80.0–100.0)
Platelets: 250 K/uL (ref 150–400)
RBC: 3.76 MIL/uL — ABNORMAL LOW (ref 4.22–5.81)
RDW: 16.2 % — ABNORMAL HIGH (ref 11.5–15.5)
WBC: 5.4 K/uL (ref 4.0–10.5)
nRBC: 0 % (ref 0.0–0.2)

## 2023-12-24 LAB — RENAL FUNCTION PANEL
Albumin: 3.4 g/dL — ABNORMAL LOW (ref 3.5–5.0)
Anion gap: 7 (ref 5–15)
BUN: 45 mg/dL — ABNORMAL HIGH (ref 8–23)
CO2: 17 mmol/L — ABNORMAL LOW (ref 22–32)
Calcium: 8.7 mg/dL — ABNORMAL LOW (ref 8.9–10.3)
Chloride: 116 mmol/L — ABNORMAL HIGH (ref 98–111)
Creatinine, Ser: 3.36 mg/dL — ABNORMAL HIGH (ref 0.61–1.24)
GFR, Estimated: 18 mL/min — ABNORMAL LOW (ref >60)
Glucose, Bld: 94 mg/dL (ref 70–99)
Phosphorus: 3.5 mg/dL (ref 2.5–4.6)
Potassium: 3.2 mmol/L — ABNORMAL LOW (ref 3.5–5.1)
Sodium: 140 mmol/L (ref 135–145)

## 2023-12-24 LAB — PROTEIN / CREATININE RATIO, URINE
Creatinine, Urine: 87 mg/dL
Protein Creatinine Ratio: 0.11 mg/mg{creat} (ref 0.00–0.15)
Total Protein, Urine: 10 mg/dL

## 2023-12-24 LAB — HEMOGLOBIN A1C
Hgb A1c MFr Bld: 5.1 % (ref 4.8–5.6)
Mean Plasma Glucose: 100 mg/dL

## 2023-12-24 LAB — LIPID PANEL
Cholesterol: 109 mg/dL (ref 0–200)
HDL: 41 mg/dL (ref >40)
LDL Cholesterol: 54 mg/dL (ref 0–99)
Total CHOL/HDL Ratio: 2.7 ratio
Triglycerides: 71 mg/dL (ref <150)
VLDL: 14 mg/dL (ref 0–40)

## 2023-12-24 LAB — C DIFFICILE QUICK SCREEN W PCR REFLEX
C Diff antigen: NEGATIVE
C Diff interpretation: NOT DETECTED
C Diff toxin: NEGATIVE

## 2023-12-24 LAB — MAGNESIUM: Magnesium: 2.3 mg/dL (ref 1.7–2.4)

## 2023-12-24 MED ORDER — ENSURE PLUS HIGH PROTEIN PO LIQD
237.0000 mL | Freq: Two times a day (BID) | ORAL | Status: DC
  Administered 2023-12-24 – 2023-12-26 (×3): 237 mL via ORAL

## 2023-12-24 MED ORDER — SODIUM BICARBONATE 8.4 % IV SOLN
INTRAVENOUS | Status: DC
  Filled 2023-12-24 (×2): qty 1000

## 2023-12-24 MED ORDER — POTASSIUM CHLORIDE CRYS ER 20 MEQ PO TBCR
40.0000 meq | EXTENDED_RELEASE_TABLET | Freq: Once | ORAL | Status: AC
  Administered 2023-12-24: 40 meq via ORAL
  Filled 2023-12-24: qty 2

## 2023-12-24 MED ORDER — LOPERAMIDE HCL 2 MG PO CAPS
4.0000 mg | ORAL_CAPSULE | Freq: Three times a day (TID) | ORAL | Status: DC
  Administered 2023-12-24 (×2): 4 mg via ORAL
  Filled 2023-12-24 (×2): qty 2

## 2023-12-24 MED ORDER — PEG 3350-KCL-NA BICARB-NACL 420 G PO SOLR
4000.0000 mL | Freq: Once | ORAL | Status: AC
  Administered 2023-12-24: 4000 mL via ORAL

## 2023-12-24 MED ORDER — DIPHENOXYLATE-ATROPINE 2.5-0.025 MG PO TABS
2.0000 | ORAL_TABLET | Freq: Three times a day (TID) | ORAL | Status: DC
  Administered 2023-12-24 (×2): 2 via ORAL
  Filled 2023-12-24 (×2): qty 2

## 2023-12-24 NOTE — H&P (View-Only) (Signed)
 Gastroenterology Consult   Referring Provider: No ref. provider found Primary Care Physician:  Wendolyn Jenkins Jansky, MD Primary Gastroenterologist:  Glendia Holt, MD  Patient ID: Herbert Morrison; [id number]; 10-08-1945   Admit date: 12/23/2023  LOS: 0 days   Date of Consultation: 12/24/2023  Reason for Consultation:  uncontrollable diarrhea    History of Present Illness   Herbert Morrison is a 78 y.o. 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 He was admitted in 04/2023 with rectal bleeding and drop in Hgb from 10 to 7, received one unit of prbcs, and CTA was negative. Eliquis was discontinued. Admitted in 05/2023 with generalized weakness and altered MS attributed to dehydration/high output diarrhea. Cre 3.9 on admission, improved to 1.9 at discharged, baseline Cre prior to colon resection was 1.4. Patient presenting to ED with mental status changes (confusion/slurred speech). GI consulted for chronic diarrhea.  In ED: Hgb 11.7 (12.2 10/2023), Cre 3.64 (2.42 10/2023) prior to colectomy his Cre was in the 1.4 range. Lipase 56. Ammonia 30. Today: Hgb 10.4, Cre 3.36, K 3.2.  GI consult: longstanding history of UC. States he was doing well, in remission, not really requiring any medication leading up to his colon surgery. Had been years since on maintenance UC medications. Chronically had been on prednisone  for lupus, this was discontinued at some point after his colon surgery.   Completed laparoscopic colectomy with ileal rectosigmoid anastomosis 04/06/2023. States he has never fully recovered. Suffers from chronic diarrhea, fatigue which can put him in bed for days. Never has a solid stool. Stools typically watery but nonbloody. Tries to avoid  dairy. Cut out ice cream. No recent antibiotics. No NSAIDs/ASA. Has been off prednisone  at least six months with exception of medrol dose tapers couple of times for lupus.  Can have 4-8 watery stools daily. Notes urgency. Appetite is diminished. Some nausea. Takes reglan  when needed. Has lost 40 pounds since his surgery.   Follows closelty with Dr. Eloy, last seen virtually 10/2023. Advised imodium  8 tabs/day and adding lomotil  prn, 1-2 tabs q6hrs. Plans for flex sig in 02/2024. Advised to reduce dairy, sugary foods, artificial sweeteners, increase lean protein.  Patient states he is consistently taking imodium  2mg  TID and Lomotil  1 TID. He has not tried maximum dose prescribed (finds it difficult to remember QID dosing). Hates taking medication.   Last night had six stools during the night.   As far as abdominal pain, he has chronic right flank pain sometimes migrates to left. Seems to be tied to his bowel movements and gas. Dealing with this for years. Nothing new to him. No heartburn, dysphagia. Had paraesophageal hernia repair in 2023.   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  Colonoscopy 02/2023: -one 15mm polyp in cecum removed, s/p clips -one 20mm polyp in cecum removed, s/p clips -one 12mm polyp in ascending colon removed -one 18mm polyp in transverse colon removed, s/p clips -numerous additional flat, large and indistinct polyps in right colon. Given size and indistinct borders, endoscopic resection  would be difficult and high risk.  -moderate diverticulosis in sigmoid colon and desc colon -distal rectum and anal verge normal -sessile  serrated adenomas   Prior to Admission medications   Medication Sig Start Date End Date Taking? Authorizing Provider  acetaminophen  (TYLENOL ) 500 MG tablet Take 1,000 mg by mouth every 6 (six) hours as needed for mild pain (pain score 1-3).   Yes [provider]  amLODipine  (NORVASC ) 10 MG tablet Take 1 tablet every day by oral route.   Yes [provider]  buPROPion  (WELLBUTRIN  XL) 300 MG 24 hr tablet Take 1 tablet (300 mg total) by mouth in the morning. 12/07/23  Yes Wendolyn Jenkins Jansky, MD  Cyanocobalamin 3000 MCG CAPS Take 1 capsule by mouth daily.   Yes [provider]  diphenoxylate -atropine  (LOMOTIL ) 2.5-0.025 MG tablet Take 1-2 tablets by mouth 4 (four) times daily as needed for diarrhea or loose stools. Patient taking differently: Take 1 tablet by mouth in the morning, at noon, and at bedtime. 06/18/23  Yes Stacia Glendia BRAVO, MD  escitalopram  (LEXAPRO ) 20 MG tablet Take 20 mg by mouth in the morning.   Yes [provider]  ferrous sulfate  325 (65 FE) MG tablet Take 1 tablet every day by oral route.   Yes [provider]  fluticasone  (FLONASE ) 50 MCG/ACT nasal spray Place 2 sprays into both nostrils as needed for allergies or rhinitis.   Yes [provider]  hydrALAZINE  (APRESOLINE ) 100 MG tablet Take 100 mg by mouth 3 (three) times daily.   Yes [provider]  HYDROcodone -acetaminophen  (NORCO) 10-325 MG tablet Take 1 tablet by mouth every 6 (six) hours as needed for moderate pain (pain score 4-6). 09/11/23  Yes [provider]  hydroxychloroquine  (PLAQUENIL ) 200 MG tablet Take 200 mg by mouth 2 (two) times daily.   Yes [provider]  Inulin (FIBER CHOICE PO) Take 1 capsule by mouth daily.   Yes [provider]  ketoconazole  (NIZORAL ) 2 % shampoo Apply 1 Application topically every other day. Every other shower   Yes [provider]  levocetirizine (XYZAL) 5 MG tablet Take 10 mg by mouth daily.    Yes [provider]  LORazepam  (ATIVAN ) 2 MG tablet Take 2 mg by mouth in the morning and at bedtime.   Yes [provider]  meclizine  (ANTIVERT ) 25 MG tablet Take 25 mg by mouth 3 (three) times daily as needed (vertigo).   Yes [provider]  methocarbamol  (ROBAXIN ) 500 MG tablet Take 1 tablet (500 mg total) by mouth every 8 (eight) hours as needed for muscle spasms. 10/30/23  Yes Wendolyn Jenkins Jansky, MD  metoCLOPramide  (REGLAN ) 5 MG tablet Take 1 tablet (5 mg total) by mouth every 6 (six) hours as needed for nausea. Patient taking differently: Take 5 mg by mouth every 6 (six) hours as needed (gas/nausea). 12/02/23  Yes Stacia Glendia BRAVO, MD  ondansetron  (ZOFRAN ) 4 MG tablet Take 4 mg by mouth every 8 (eight) hours as needed for vomiting or nausea.   Yes [provider]  Probiotic Product (PROBIOTIC ADVANCED PO) Take 1 capsule by mouth daily.   Yes [provider]  SIMETHICONE  PO Take 1 tablet by mouth in the morning, at noon, and at bedtime.   Yes [provider]    Current Facility-Administered Medications  Medication Dose Route Frequency Provider Last Rate Last Admin   acetaminophen  (TYLENOL ) tablet 650 mg  650 mg Oral Q6H PRN Vicci Afton CROME, MD  Or   acetaminophen  (TYLENOL ) suppository 650 mg  650 mg Rectal Q6H PRN Johnson, Clanford L, MD       amLODipine  (NORVASC ) tablet 10 mg  10 mg Oral Daily Johnson, Clanford L, MD       buPROPion  (WELLBUTRIN  XL) 24 hr tablet 300 mg  300 mg Oral q AM Johnson, Clanford L, MD       diphenoxylate -atropine  (LOMOTIL ) 2.5-0.025 MG per tablet 1-2 tablet  1-2 tablet Oral QID PRN Johnson, Clanford L, MD       escitalopram  (LEXAPRO ) tablet 20 mg  20 mg Oral q AM Johnson, Clanford L, MD       fentaNYL  (SUBLIMAZE ) injection 12.5 mcg  12.5 mcg Intravenous Q2H PRN Johnson, Clanford L, MD       ferrous sulfate  tablet 325 mg  325 mg Oral Q breakfast Johnson, Clanford L, MD       heparin  injection 5,000  Units  5,000 Units Subcutaneous Q8H Johnson, Clanford L, MD   5,000 Units at 12/24/23 9385   hydrALAZINE  (APRESOLINE ) tablet 100 mg  100 mg Oral TID Johnson, Clanford L, MD       HYDROcodone -acetaminophen  (NORCO) 10-325 MG per tablet 1 tablet  1 tablet Oral Q6H PRN Johnson, Clanford L, MD       hydroxychloroquine  (PLAQUENIL ) tablet 200 mg  200 mg Oral BID Vicci, Clanford L, MD   200 mg at 12/23/23 2108   loratadine  (CLARITIN ) tablet 10 mg  10 mg Oral Daily Johnson, Clanford L, MD       LORazepam  (ATIVAN ) tablet 0.5 mg  0.5 mg Oral BID PRN Johnson, Clanford L, MD       ondansetron  (ZOFRAN ) tablet 4 mg  4 mg Oral Q6H PRN Johnson, Clanford L, MD       Or   ondansetron  (ZOFRAN ) injection 4 mg  4 mg Intravenous Q6H PRN Johnson, Clanford L, MD       sodium bicarbonate  150 mEq in dextrose  5 % 1,150 mL infusion   Intravenous Continuous Vicci, Clanford L, MD 75 mL/hr at 12/23/23 1956 New Bag at 12/23/23 1956   traZODone  (DESYREL ) tablet 25 mg  25 mg Oral QHS PRN Vicci Pen L, MD        Allergies as of 12/23/2023 - Review Complete 12/23/2023  Allergen Reaction Noted   Other Anaphylaxis and Other (See Comments) 03/01/2023   Remicade [infliximab] Anaphylaxis 09/30/2021   Sulfa antibiotics Anaphylaxis, Diarrhea, and Nausea And Vomiting 07/25/2022   Erythromycin Hives and Nausea And Vomiting 07/17/2014   Mesalamine  Other (See Comments) 12/07/2023   Pollen extract Other (See Comments) 11/05/2021   Sulfasalazine Nausea And Vomiting 01/23/2021   Grass pollen(k-o-r-t-swt vern) Other (See Comments) 11/05/2021    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   CKD (chronic kidney disease) stage 3, GFR 30-59 ml/min (HCC)    3b   Depression    Dyspnea    GERD (gastroesophageal reflux disease)    Grade I diastolic dysfunction 04/28/2023   Heart murmur    as a child   Hiatal hernia    History of hiatal hernia     Hypertension    Lupus 12/31/2020   Rheumatoid arthritis (HCC) 12/31/2020   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[facility])     Past Surgical History:  Procedure Laterality Date   BIOPSY  01/30/2021   Procedure: BIOPSY;  Surgeon: Golda Claudis PENNER, MD;  [facility];  Service: Endoscopy;;   CATARACT EXTRACTION Left 2023   COLONOSCOPY N/A 04/09/2016   Procedure: COLONOSCOPY;  Surgeon: Claudis RAYMOND Rivet, MD;  [facility];  Service: Endoscopy;  Laterality: N/A;  1:45   COLONOSCOPY WITH PROPOFOL  N/A 01/30/2021   Procedure: COLONOSCOPY WITH PROPOFOL ;  Surgeon: Rivet Claudis RAYMOND, MD;  [facility];  Service: Endoscopy;  Laterality: N/A;   COLONOSCOPY WITH PROPOFOL  N/A 02/17/2023   Procedure: COLONOSCOPY WITH PROPOFOL ;  Surgeon: Stacia Glendia BRAVO, MD;  Location: [facility];  Service: Gastroenterology;  Laterality: N/A;   ELBOW BURSA SURGERY Left 1983   ESOPHAGOGASTRODUODENOSCOPY (EGD) WITH PROPOFOL  N/A 01/30/2021   Procedure: ESOPHAGOGASTRODUODENOSCOPY (EGD) WITH PROPOFOL ;  Surgeon: Rivet Claudis RAYMOND, MD;  [facility];  Service: Endoscopy;  Laterality: N/A;  2:00, pt knows to arrive at 11:00   HEMOSTASIS CLIP PLACEMENT  02/17/2023   Procedure: HEMOSTASIS CLIP PLACEMENT;  Surgeon: Stacia Glendia BRAVO, MD;  [facility];  Service: Gastroenterology;;   KNEE ARTHROSCOPY Right 1979   left elbow     spur removed   left knee surgery     open surgery for a meniscus tear 1970's   lungs  07/2022   thoracentesis   PARTIAL COLECTOMY  2024   polyps   POLYPECTOMY  04/09/2016   Procedure: POLYPECTOMY;  Surgeon: Claudis RAYMOND Rivet, MD;  [facility];  Service: Endoscopy;;  cecal polyp   POLYPECTOMY  01/30/2021   Procedure: POLYPECTOMY;  Surgeon: Rivet Claudis RAYMOND, MD;  [facility];  Service: Endoscopy;;   POLYPECTOMY N/A 02/17/2023   Procedure: POLYPECTOMY;  Surgeon: Stacia Glendia BRAVO, MD;  Location: [facility];  Service: Gastroenterology;   Laterality: N/A;   SUBMUCOSAL LIFTING INJECTION  02/17/2023   Procedure: SUBMUCOSAL LIFTING INJECTION;  Surgeon: Stacia Glendia BRAVO, MD;  Location: [facility];  Service: Gastroenterology;;   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   Procedure: LEFT TOTAL HIP ARTHROPLASTY ANTERIOR APPROACH;  Surgeon: Ernie Cough, MD;  [facility];  Service: Orthopedics;  Laterality: Left;  70 mins   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   Procedure: TOTAL KNEE ARTHROPLASTY;  Surgeon: Ernie Cough, MD;  [facility];  Service: Orthopedics;  Laterality: Left;  70 mins   UMBILICAL HERNIA REPAIR N/A 05/09/2022   Procedure: PRIMARY UMBILICAL HERNIA REPAIR;  Surgeon: Sheldon Standing, MD;  [facility];  Service: General;  Laterality: N/A;   XI ROBOTIC ASSISTED PARAESOPHAGEAL HERNIA REPAIR N/A 05/09/2022   Procedure: ROBOTIC REPAIR OF PARAESOPHAGEAL HIATAL HERNIA WITH FUNDOPLICATION;  Surgeon: Sheldon Standing, MD;  Location: [facility];  Service: General;  Laterality: N/A;  GEN w/ERAS LOCAL    Family History  Problem Relation Age of Onset   Leukemia Father    Bladder Cancer Father    Stomach cancer Neg Hx    Esophageal cancer Neg Hx    Colon cancer Neg Hx    Pancreatic cancer Neg Hx    Colon polyps Neg Hx    Rectal cancer Neg Hx     Social History   Socioeconomic History   Marital status: Married    Spouse name: Not on file   Number of children: 2   Years of education: Not on file   Highest education level: Bachelor's degree (e.g., BA, AB, BS)  Occupational History   Not on file  Tobacco Use   Smoking status: Former    Current packs/day: 0.00    Average packs/day: 1 pack/day for 5.0 years (  5.0 ttl pk-yrs)    Types: Cigarettes    Start date: 56    Quit date: 22    Years since quitting: 50.6   Smokeless tobacco: Former    Types: Chew    Quit date: 01/22/1965   Tobacco comments:    quit 43 years ago  Vaping Use   Vaping status: Never Used  Substance and Sexual Activity    Alcohol use: Not Currently    Comment: none   Drug use: No   Sexual activity: Yes  Other Topics Concern   Not on file  Social History Narrative   No grands   [place] -3 yrs   Retired -Secondary school teacher, massage therapy   Social Drivers of Health   Financial Resource Strain: Low Risk  (09/25/2023)   Overall Financial Resource Strain (CARDIA)    Difficulty of Paying Living Expenses: Not very hard  Food Insecurity: No Food Insecurity (12/23/2023)   Hunger Vital Sign    Worried About Running Out of Food in the Last Year: Never true    Ran Out of Food in the Last Year: Never true  Transportation Needs: No Transportation Needs (12/23/2023)   PRAPARE - Administrator, Civil Service (Medical): No    Lack of Transportation (Non-Medical): No  Physical Activity: Insufficiently Active (09/25/2023)   Exercise Vital Sign    Days of Exercise per Week: 3 days    Minutes of Exercise per Session: 10 min  Stress: Stress Concern Present (09/25/2023)   [employer] of Occupational Health - Occupational Stress Questionnaire    Feeling of Stress : To some extent  Social Connections: Socially Integrated (12/23/2023)   Social Connection and Isolation Panel    Frequency of Communication with Friends and Family: More than three times a week    Frequency of Social Gatherings with Friends and Family: More than three times a week    Attends Religious Services: 1 to 4 times per year    Active Member of [employer] or Organizations: No    Attends Engineer, structural: 1 to 4 times per year    Marital Status: Married  Catering manager Violence: Not At Risk (12/23/2023)   Humiliation, Afraid, Rape, and Kick questionnaire    Fear of Current or Ex-Partner: No    Emotionally Abused: No    Physically Abused: No    Sexually Abused: No     Review of System:   General: Negative for fever, chills, fatigue, +weakness, +weight loss, +diminished appetite Eyes: Negative for vision changes.  ENT: Negative  for hoarseness, difficulty swallowing , nasal congestion. CV: Negative for chest pain, angina, palpitations, dyspnea on exertion, peripheral edema.  Respiratory: Negative for dyspnea at rest, dyspnea on exertion, cough, sputum, wheezing.  GI: See history of present illness. GU:  Negative for dysuria, hematuria, urinary incontinence, urinary frequency, nocturnal urination.  MS: +joint pain.  Derm: Negative for rash or itching.  Neuro: Negative for weakness, abnormal sensation, seizure, frequent headaches, memory loss, confusion. Presented with confusion, dementia like symptoms, slurred speech Psych: Negative for anxiety, depression, suicidal ideation, hallucinations.  Endo: Negative for unusual weight change.  Heme: Negative for bruising or bleeding. Allergy: Negative for rash or hives.      Physical Examination:   Vital signs in last 24 hours: Temp:  [97.4 F (36.3 C)-98.4 F (36.9 C)] 98.4 F (36.9 C) (08/14 0346) Pulse Rate:  [49-53] 51 (08/14 0346) Resp:  [11-18] 18 (08/14 0346) BP: (105-131)/(50-82) 127/64 (08/14 0346) SpO2:  [91 %-  99 %] 97 % (08/14 0346) Weight:  [80.5 kg-80.7 kg] 80.5 kg (08/13 1849) Last BM Date : 12/23/23  General: Well-nourished, well-developed in no acute distress. Wife at bedside. Speech normal. Feels back to his normal self as far as mental status changes Head: Normocephalic, atraumatic.   Eyes: Conjunctiva pink, no icterus. Mouth: Oropharyngeal mucosa moist and pink , no lesions erythema or exudate. Neck: Supple without thyromegaly, masses, or lymphadenopathy.  Lungs: Clear to auscultation bilaterally.  Heart: Regular rate and rhythm, no murmurs rubs or gallops.  Abdomen: Bowel sounds are normal, nondistended, no hepatosplenomegaly or masses, no abdominal bruits or hernia , no rebound or guarding.  Mild tenderness right abd Rectal: not performed Extremities: No lower extremity edema, clubbing, deformity.  Neuro: Alert and oriented x 4 , grossly  normal neurologically.  Skin: Warm and dry, no rash or jaundice.   Psych: Alert and cooperative, normal mood and affect.        Intake/Output from previous day: 08/13 0701 - 08/14 0700 In: 928.1 [P.O.:240; I.V.:688.1] Out: -  Intake/Output this shift: No intake/output data recorded.  Lab Results:   CBC Recent Labs    12/23/23 1438 12/24/23 0432  WBC 7.2 5.4  HGB 11.7* 10.4*  HCT 37.8* 33.1*  MCV 90.4 88.0  PLT 293 250   BMET Recent Labs    12/23/23 1438 12/24/23 0432  NA 137 140  K 4.0 3.2*  CL 113* 116*  CO2 16* 17*  GLUCOSE 75 94  BUN 48* 45*  CREATININE 3.64* 3.36*  CALCIUM  9.5 8.7*   LFT Recent Labs    12/23/23 1438 12/24/23 0432  BILITOT 0.6  --   ALKPHOS 49  --   AST 13*  --   ALT 10  --   PROT 6.8  --   ALBUMIN  4.0 3.4*    Lipase Recent Labs    12/23/23 1438  LIPASE 56*    PT/INR No results for input(s): LABPROT, INR in the last 72 hours.   Hepatitis Panel No results for input(s): HEPBSAG, HCVAB, HEPAIGM, HEPBIGM in the last 72 hours.   Imaging Studies:   CT Head Wo Contrast Result Date: 12/23/2023 EXAM: CT HEAD WITHOUT CONTRAST 12/23/2023 04:44:55 PM TECHNIQUE: CT of the head was performed without the administration of intravenous contrast. Automated exposure control, iterative reconstruction, and/or weight based adjustment of the mA/kV was utilized to reduce the radiation dose to as low as reasonably achievable. COMPARISON: 04/26/2023 CLINICAL HISTORY: Mental status change, unknown cause. FINDINGS: BRAIN AND VENTRICLES: No acute hemorrhage. Gray-white differentiation is preserved. No hydrocephalus. No extra-axial collection. No mass effect or midline shift. Mild periventricular white matter disease. ORBITS: No acute abnormality. SINUSES: No acute abnormality. SOFT TISSUES AND SKULL: No acute soft tissue abnormality. No skull fracture. IMPRESSION: 1. No acute intracranial abnormality. 2. Mild periventricular white matter disease.  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 GI consulted for chronic diarrhea.  Chronic diarrhea: Long-standing UC, essentially in remission for years without using chronic maintenance medications but has been on chronic prednisone  for lupus up until about six months ago. Since subtotal colectomy with ileal rectosigmoid  anastomosis 03/2023 he has had intractable diarrhea with need for hospitalization for dehydration/electrolyte derangements, has noted decline in CKD. Has been following closely with Dr. Stacia (primary GI) and instructed to utilize Lomotil  1-2 every 6 hours, up to 8 imodium  per day. Patient consistently taking imodium  2mg  TID, lomotil  1 tablet TID and having 6 plus watery stools daily. Stool studies negative for Cdiff and neg GI panel in 05/2023. Stools are nonbloody. It is unclear if diarrhea related to UC flare (as he has distal sigmoid/rectum), infectious etiology (CMV needs to be ruled out), surgically altered bowel, adrenal insufficiency. TSH normal in June.   Anemia: first noted 04/2023, with Hgb persistently in the 8-10 range. Low iron (22) and ferritin (13.5) 06/2023 but improved with normal values 10/2023 (ferritin 58, iron 56). No overt GI bleeding.   Weight loss: 220 pounds in 2023. In 08/2022, 197 pounds. In 06/2023, 190 pounds. Currently 177 pounds.   Plan:   Cdiff, GI panel, fecal calprotectin. Folate, B12, iron/tibc/ferritin. Fasting AM cortisol.  Flex sig tomorrow. Will need bowel prep  as he had solid food today.  I have discussed the risks, alternatives, benefits with regards to but not limited to the risk of reaction to medication, bleeding, infection, perforation and the patient is agreeable to proceed. Written consent to be obtained. Will hold lomotil , loperamide  while prepping.  Will hold heparin  after midnight.    LOS: 0 days   We would like to thank you for the opportunity to participate in the care of GAJE TENNYSON.  Sonny RAMAN. Ezzard RIGGERS [facility] [phone] 8/14/20257:32 AM

## 2023-12-24 NOTE — TOC CM/SW Note (Signed)
 Transition of Care [facility]) - Inpatient Brief Assessment   Patient Details  Name: Herbert Morrison MRN: [id number] Date of Birth: 06-23-1945  [facility]) CM/SW Contact:    Lucie Lunger, LCSWA Phone Number: 12/24/2023, 9:40 AM   Clinical Narrative: Transition of Care Department [facility]) has reviewed patient and no TOC needs have been identified at this time. We will continue to monitor patient advancement through interdiciplinary progression rounds. If new patient transition needs arise, please place a TOC consult.  Transition of Care Asessment: Insurance and Status: Insurance coverage has been reviewed Patient has primary care physician: Yes Home environment has been reviewed: From home Prior level of function:: Independent Prior/Current Home Services: No current home services Social Drivers of Health Review: SDOH reviewed no interventions necessary Readmission risk has been reviewed: Yes Transition of care needs: no transition of care needs at this time

## 2023-12-24 NOTE — Consult Note (Addendum)
 [facility]  INPATIENT CONSULTATION  Reason for Consultation: AKI on CKD Requesting Provider: Dr. Vicci  HPI: Herbert Morrison is an 78 y.o. 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    Presented to [facility] ED yesterday with a several day history of AMS - confusion, delerium, difficulty walking and doing simple tasks.  He was afebrile, SB, BP 100s/60s on presentation and by report was very dry appearing.  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  He usually is able to keep up oral intake of fluids but in the past few days had decreased po intake.  Labs showed Cr 3.6 from recent baseline since 05/2023 of Cr ~2.  After receiving IVF overnight his Cr has improved to 3.36 this AM.  He denies difficulty voiding and his UA showed 1+ protein o/w bland.   Cr had been in the 1.3-1.5mg /dL range until 12/7972 when he was admitted under similar circumstances as this admission.  He had AKI with peak Cr 3.9.  Cr trended down with IVF to nadir of 1.9 and has remained in the 1.9-2 range since.  Labs 10/2023 showed Cr 2.4.  05/2023 CT scan showed normal kidneys (some simple cysts no f/u required).  Of note: Pt has an appt with Dr. Rayburn on Monday at [facility] clinic for initial nephrology consultation.    This AM he feels fine and is eating a good breakfast.  GI consult pending re: diarrhea.   His wife is bedside this AM.   PMH: 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   CKD (chronic kidney disease) stage 3, GFR 30-59 ml/min (HCC)    3b   Depression    Dyspnea    GERD (gastroesophageal reflux disease)    Grade I diastolic dysfunction 04/28/2023   Heart murmur    as a child   Hiatal hernia     History of hiatal hernia    Hypertension    Lupus 12/31/2020   Rheumatoid arthritis (HCC) 12/31/2020   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(HCC)    PSH: Past Surgical History:  Procedure Laterality Date   BIOPSY  01/30/2021   Procedure: BIOPSY;  Surgeon: Golda Claudis PENNER, MD;  [facility];  Service: Endoscopy;;   CATARACT EXTRACTION Left 2023   COLONOSCOPY N/A 04/09/2016   Procedure: COLONOSCOPY;  Surgeon: Claudis PENNER Golda, MD;  [facility];  Service: Endoscopy;  Laterality: N/A;  1:45   COLONOSCOPY WITH PROPOFOL  N/A 01/30/2021   Procedure: COLONOSCOPY WITH PROPOFOL ;  Surgeon: Golda Claudis PENNER, MD;  [facility];  Service: Endoscopy;  Laterality: N/A;   COLONOSCOPY WITH PROPOFOL  N/A 02/17/2023   Procedure: COLONOSCOPY WITH PROPOFOL ;  Surgeon: Stacia Glendia BRAVO, MD;  [facility];  Service: Gastroenterology;  Laterality: N/A;   ELBOW BURSA SURGERY Left 1983   ESOPHAGOGASTRODUODENOSCOPY (EGD) WITH PROPOFOL  N/A 01/30/2021   Procedure: ESOPHAGOGASTRODUODENOSCOPY (EGD) WITH PROPOFOL ;  Surgeon: Golda Claudis PENNER, MD;  [facility];  Service: Endoscopy;  Laterality: N/A;  2:00, pt knows to arrive at 11:00   HEMOSTASIS CLIP PLACEMENT  02/17/2023   Procedure: HEMOSTASIS CLIP PLACEMENT;  Surgeon: Stacia Glendia BRAVO, MD;  [facility];  Service: Gastroenterology;;  KNEE ARTHROSCOPY Right 1979   left elbow     spur removed   left knee surgery     open surgery for a meniscus tear 1970's   lungs  07/2022   thoracentesis   PARTIAL COLECTOMY  2024   polyps   POLYPECTOMY  04/09/2016   Procedure: POLYPECTOMY;  Surgeon: Claudis RAYMOND Rivet, MD;  [facility];  Service: Endoscopy;;  cecal polyp   POLYPECTOMY  01/30/2021   Procedure: POLYPECTOMY;  Surgeon: Rivet Claudis RAYMOND, MD;  [facility];  Service: Endoscopy;;   POLYPECTOMY N/A 02/17/2023   Procedure: POLYPECTOMY;  Surgeon: Stacia Glendia BRAVO, MD;  Location: [facility];   Service: Gastroenterology;  Laterality: N/A;   SUBMUCOSAL LIFTING INJECTION  02/17/2023   Procedure: SUBMUCOSAL LIFTING INJECTION;  Surgeon: Stacia Glendia BRAVO, MD;  Location: [facility];  Service: Gastroenterology;;   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   Procedure: LEFT TOTAL HIP ARTHROPLASTY ANTERIOR APPROACH;  Surgeon: Ernie Cough, MD;  [facility];  Service: Orthopedics;  Laterality: Left;  70 mins   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   Procedure: TOTAL KNEE ARTHROPLASTY;  Surgeon: Ernie Cough, MD;  [facility];  Service: Orthopedics;  Laterality: Left;  70 mins   UMBILICAL HERNIA REPAIR N/A 05/09/2022   Procedure: PRIMARY UMBILICAL HERNIA REPAIR;  Surgeon: Sheldon Standing, MD;  [facility];  Service: General;  Laterality: N/A;   XI ROBOTIC ASSISTED PARAESOPHAGEAL HERNIA REPAIR N/A 05/09/2022   Procedure: ROBOTIC REPAIR OF PARAESOPHAGEAL HIATAL HERNIA WITH FUNDOPLICATION;  Surgeon: Sheldon Standing, MD;  [facility]: [facility];  Service: General;  Laterality: N/A;  GEN w/ERAS LOCAL    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   CKD (chronic kidney disease) stage 3, GFR 30-59 ml/min (HCC)    3b   Depression    Dyspnea    GERD (gastroesophageal reflux disease)    Grade I diastolic dysfunction 04/28/2023   Heart murmur    as a child   Hiatal hernia    History of hiatal hernia    Hypertension    Lupus 12/31/2020   Rheumatoid arthritis (HCC) 12/31/2020   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(HCC)     Medications:  I have reviewed the patient's current medications.  Medications Prior to Admission  Medication Sig Dispense Refill   acetaminophen  (TYLENOL ) 500 MG tablet Take 1,000 mg by mouth every 6 (six) hours as needed for mild pain (pain score 1-3).     amLODipine  (NORVASC ) 10 MG tablet Take 1 tablet every day by oral route.     buPROPion  (WELLBUTRIN  XL) 300 MG 24 hr tablet Take 1  tablet (300 mg total) by mouth in the morning. 90 tablet 1   Cyanocobalamin 3000 MCG CAPS Take 1 capsule by mouth daily.     diphenoxylate -atropine  (LOMOTIL ) 2.5-0.025 MG tablet Take 1-2 tablets by mouth 4 (four) times daily as needed for diarrhea or loose stools. (Patient taking differently: Take 1 tablet by mouth in the morning, at noon, and at bedtime.) 30 tablet 3   escitalopram  (LEXAPRO ) 20 MG tablet Take 20 mg by mouth in the morning.     ferrous sulfate  325 (65 FE) MG tablet Take 1 tablet every day by oral route.     fluticasone  (FLONASE ) 50 MCG/ACT nasal spray Place 2 sprays into both nostrils as needed for allergies or rhinitis.  hydrALAZINE  (APRESOLINE ) 100 MG tablet Take 100 mg by mouth 3 (three) times daily.     HYDROcodone -acetaminophen  (NORCO) 10-325 MG tablet Take 1 tablet by mouth every 6 (six) hours as needed for moderate pain (pain score 4-6).     hydroxychloroquine  (PLAQUENIL ) 200 MG tablet Take 200 mg by mouth 2 (two) times daily.     Inulin (FIBER CHOICE PO) Take 1 capsule by mouth daily.     ketoconazole  (NIZORAL ) 2 % shampoo Apply 1 Application topically every other day. Every other shower     levocetirizine (XYZAL) 5 MG tablet Take 10 mg by mouth daily.     LORazepam  (ATIVAN ) 2 MG tablet Take 2 mg by mouth in the morning and at bedtime.     meclizine  (ANTIVERT ) 25 MG tablet Take 25 mg by mouth 3 (three) times daily as needed (vertigo).     methocarbamol  (ROBAXIN ) 500 MG tablet Take 1 tablet (500 mg total) by mouth every 8 (eight) hours as needed for muscle spasms. 90 tablet 3   metoCLOPramide  (REGLAN ) 5 MG tablet Take 1 tablet (5 mg total) by mouth every 6 (six) hours as needed for nausea. (Patient taking differently: Take 5 mg by mouth every 6 (six) hours as needed (gas/nausea).) 60 tablet 1   ondansetron  (ZOFRAN ) 4 MG tablet Take 4 mg by mouth every 8 (eight) hours as needed for vomiting or nausea.     Probiotic Product (PROBIOTIC ADVANCED PO) Take 1 capsule by mouth  daily.     SIMETHICONE  PO Take 1 tablet by mouth in the morning, at noon, and at bedtime.      ALLERGIES:   Allergies  Allergen Reactions   Other Anaphylaxis and Other (See Comments)    Rumicade   Remicade [Infliximab] Anaphylaxis   Sulfa Antibiotics Anaphylaxis, Diarrhea and Nausea And Vomiting   Erythromycin Hives and Nausea And Vomiting   Mesalamine  Other (See Comments)    Unknown    Pollen Extract Other (See Comments)    Unknown    Sulfasalazine Nausea And Vomiting   Grass Pollen(K-O-R-T-Swt Vern) Other (See Comments)    Seasonal allergies    FAM HX: Family History  Problem Relation Age of Onset   Leukemia Father    Bladder Cancer Father    Stomach cancer Neg Hx    Esophageal cancer Neg Hx    Colon cancer Neg Hx    Pancreatic cancer Neg Hx    Colon polyps Neg Hx    Rectal cancer Neg Hx     Social History:   reports that he quit smoking about 50 years ago. His smoking use included cigarettes. He started smoking about 55 years ago. He has a 5 pack-year smoking history. He quit smokeless tobacco use about 58 years ago.  His smokeless tobacco use included chew. He reports that he does not currently use alcohol. He reports that he does not use drugs.  ROS: 12 system ROS neg except per HPI above  Blood pressure (!) 153/80, pulse (!) 51, temperature 98.4 F (36.9 C), temperature source Oral, resp. rate 18, height 5' 5 (1.651 m), weight 80.5 kg, SpO2 97%. PHYSICAL EXAM: Gen: eating full breakfast, appearing nontoxic  Eyes: glasses, EOMI ENT: MMM Neck: supple, no JVD CV: RRR, in 60s on exam Abd: soft, +BS Back: clear lungs GU: no foley Extr:  no edema Neuro: conversant, nonfocal, oriented   Results for orders placed or performed during the hospital encounter of 12/23/23 (from the past 48 hours)  Urinalysis, Routine w  reflex microscopic -Urine, Clean Catch     Status: Abnormal   Collection Time: 12/23/23  2:37 PM  Result Value Ref Range   Color, Urine YELLOW  YELLOW   APPearance CLEAR CLEAR   Specific Gravity, Urine 1.013 1.005 - 1.030   pH 5.0 5.0 - 8.0   Glucose, UA NEGATIVE NEGATIVE mg/dL   Hgb urine dipstick NEGATIVE NEGATIVE   Bilirubin Urine NEGATIVE NEGATIVE   Ketones, ur NEGATIVE NEGATIVE mg/dL   Protein, ur 30 (A) NEGATIVE mg/dL   Nitrite NEGATIVE NEGATIVE   Leukocytes,Ua NEGATIVE NEGATIVE   RBC / HPF 0-5 0 - 5 RBC/hpf   WBC, UA 0-5 0 - 5 WBC/hpf   Bacteria, UA RARE (A) NONE SEEN   Squamous Epithelial / HPF 0-5 0 - 5 /HPF   Hyaline Casts, UA PRESENT     Comment: Performed at [facility], [address], [place], [place] [address]  CBC with Differential     Status: Abnormal   Collection Time: 12/23/23  2:38 PM  Result Value Ref Range   WBC 7.2 4.0 - 10.5 K/uL   RBC 4.18 (L) 4.22 - 5.81 MIL/uL   Hemoglobin 11.7 (L) 13.0 - 17.0 g/dL   HCT 62.1 (L) 60.9 - 47.9 %   MCV 90.4 80.0 - 100.0 fL   MCH 28.0 26.0 - 34.0 pg   MCHC 31.0 30.0 - 36.0 g/dL   RDW 83.3 (H) 88.4 - 84.4 %   Platelets 293 150 - 400 K/uL   nRBC 0.0 0.0 - 0.2 %   Neutrophils Relative % 69 %   Neutro Abs 5.0 1.7 - 7.7 K/uL   Lymphocytes Relative 13 %   Lymphs Abs 0.9 0.7 - 4.0 K/uL   Monocytes Relative 14 %   Monocytes Absolute 1.0 0.1 - 1.0 K/uL   Eosinophils Relative 3 %   Eosinophils Absolute 0.2 0.0 - 0.5 K/uL   Basophils Relative 1 %   Basophils Absolute 0.1 0.0 - 0.1 K/uL   Immature Granulocytes 0 %   Abs Immature Granulocytes 0.03 0.00 - 0.07 K/uL    Comment: Performed at [facility], [address], [place], [place] [address]  Comprehensive metabolic panel     Status: Abnormal   Collection Time: 12/23/23  2:38 PM  Result Value Ref Range   Sodium 137 135 - 145 mmol/L   Potassium 4.0 3.5 - 5.1 mmol/L   Chloride 113 (H) 98 - 111 mmol/L   CO2 16 (L) 22 - 32 mmol/L   Glucose, Bld 75 70 - 99 mg/dL    Comment: Glucose reference range applies only to samples taken after fasting for at least 8 hours.   BUN 48 (H) 8 - 23 mg/dL   Creatinine, Ser 6.35 (H)  0.61 - 1.24 mg/dL   Calcium  9.5 8.9 - 10.3 mg/dL   Total Protein 6.8 6.5 - 8.1 g/dL   Albumin  4.0 3.5 - 5.0 g/dL   AST 13 (L) 15 - 41 U/L   ALT 10 0 - 44 U/L   Alkaline Phosphatase 49 38 - 126 U/L   Total Bilirubin 0.6 0.0 - 1.2 mg/dL   GFR, Estimated 17 (L) >60 mL/min    Comment: (NOTE) Calculated using the CKD-EPI Creatinine Equation (2021)    Anion gap 8 5 - 15    Comment: Performed at [facility], [address], [place], [place] [address]  Lipase, blood     Status: Abnormal   Collection Time: 12/23/23  2:38 PM  Result Value  Ref Range   Lipase 56 (H) 11 - 51 U/L    Comment: Performed at [facility], [address] [place], [place] [address]  Hemoglobin A1c     Status: None   Collection Time: 12/23/23  2:38 PM  Result Value Ref Range   Hgb A1c MFr Bld 5.1 4.8 - 5.6 %    Comment: (NOTE)         Prediabetes: 5.7 - 6.4         Diabetes: >6.4         Glycemic control for adults with diabetes: <7.0    Mean Plasma Glucose 100 mg/dL    Comment: (NOTE) Performed At: [facility] [address] [place], [place] [id number] Jennette Shorter MD Ey:[phone]   CBG monitoring, ED     Status: None   Collection Time: 12/23/23  3:34 PM  Result Value Ref Range   Glucose-Capillary 90 70 - 99 mg/dL    Comment: Glucose reference range applies only to samples taken after fasting for at least 8 hours.  Ammonia     Status: None   Collection Time: 12/23/23  5:03 PM  Result Value Ref Range   Ammonia 30 9 - 35 umol/L    Comment: Performed at [facility], [address] [place], [place] [address]  Renal function panel     Status: Abnormal   Collection Time: 12/24/23  4:32 AM  Result Value Ref Range   Sodium 140 135 - 145 mmol/L   Potassium 3.2 (L) 3.5 - 5.1 mmol/L   Chloride 116 (H) 98 - 111 mmol/L   CO2 17 (L) 22 - 32 mmol/L   Glucose, Bld 94 70 - 99 mg/dL    Comment: Glucose reference range applies only to samples taken after fasting for at least 8 hours.   BUN 45 (H) 8 - 23  mg/dL   Creatinine, Ser 6.63 (H) 0.61 - 1.24 mg/dL   Calcium  8.7 (L) 8.9 - 10.3 mg/dL   Phosphorus 3.5 2.5 - 4.6 mg/dL   Albumin  3.4 (L) 3.5 - 5.0 g/dL   GFR, Estimated 18 (L) >60 mL/min    Comment: (NOTE) Calculated using the CKD-EPI Creatinine Equation (2021)    Anion gap 7 5 - 15    Comment: Performed at [facility], [address] [place], [place] [address]  CBC     Status: Abnormal   Collection Time: 12/24/23  4:32 AM  Result Value Ref Range   WBC 5.4 4.0 - 10.5 K/uL   RBC 3.76 (L) 4.22 - 5.81 MIL/uL   Hemoglobin 10.4 (L) 13.0 - 17.0 g/dL   HCT 66.8 (L) 60.9 - 47.9 %   MCV 88.0 80.0 - 100.0 fL   MCH 27.7 26.0 - 34.0 pg   MCHC 31.4 30.0 - 36.0 g/dL   RDW 83.7 (H) 88.4 - 84.4 %   Platelets 250 150 - 400 K/uL   nRBC 0.0 0.0 - 0.2 %    Comment: Performed at [facility], [address] [place], [place] [address]  Lipid panel     Status: None   Collection Time: 12/24/23  4:32 AM  Result Value Ref Range   Cholesterol 109 0 - 200 mg/dL   Triglycerides 71 <849 mg/dL   HDL 41 >59 mg/dL   Total CHOL/HDL Ratio 2.7 RATIO   VLDL 14 0 - 40 mg/dL   LDL Cholesterol 54 0 - 99 mg/dL    Comment:        Total Cholesterol/HDL:CHD Risk  Coronary Heart Disease Risk Table                     Men   Women  1/2 Average Risk   3.4   3.3  Average Risk       5.0   4.4  2 X Average Risk   9.6   7.1  3 X Average Risk  23.4   11.0        Use the calculated Patient Ratio above and the CHD Risk Table to determine the patient's CHD Risk.        ATP III CLASSIFICATION (LDL):  <100     mg/dL   Optimal  899-870  mg/dL   Near or Above                    Optimal  130-159  mg/dL   Borderline  839-810  mg/dL   High  >809     mg/dL   Very High Performed at [facility], [address] [place], [place] [address]   Magnesium      Status: None   Collection Time: 12/24/23  7:50 AM  Result Value Ref Range   Magnesium  2.3 1.7 - 2.4 mg/dL    Comment: Performed at [facility], [address],  [place], [place] [address]    CT Head Wo Contrast Result Date: 12/23/2023 EXAM: CT HEAD WITHOUT CONTRAST 12/23/2023 04:44:55 PM TECHNIQUE: CT of the head was performed without the administration of intravenous contrast. Automated exposure control, iterative reconstruction, and/or weight based adjustment of the mA/kV was utilized to reduce the radiation dose to as low as reasonably achievable. COMPARISON: 04/26/2023 CLINICAL HISTORY: Mental status change, unknown cause. FINDINGS: BRAIN AND VENTRICLES: No acute hemorrhage. Gray-white differentiation is preserved. No hydrocephalus. No extra-axial collection. No mass effect or midline shift. Mild periventricular white matter disease. ORBITS: No acute abnormality. SINUSES: No acute abnormality. SOFT TISSUES AND SKULL: No acute soft tissue abnormality. No skull fracture. IMPRESSION: 1. No acute intracranial abnormality. 2. Mild periventricular white matter disease. Electronically signed by: evalene coho 12/23/2023 06:00 PM EDT RP Workstation: [id number]    Assessment/Plan Herbert Morrison is an 78 y.o. 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    **AMS: CT neg.  Improved, in the setting of dehydration.    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  05/2023 UA bland, 12/23/23 UA 1+ protein - does not appear to have a GN but will quantify proteinuria.  For now would continue with IVF (currently bicarb gtt) and agree with GI consultation as it appears the main issue is the voluminous diarrhea.  Will do a kidney US  while he's in but 05/2023 CT showed normal kidneys.  Will follow with daily labs.  Follow strict I/Os.   **diarrhea:  in the setting of near total colectomy - agree with involving GI while in.    **Hypokalemia:  replete this AM.    **NAGMA: in setting of diarrhea, bicarb 17 this AM.  Cont bicarb gtt for today.   **RA, SLE:  as above no sign of assoc GN currently.  No active issues at this time.  On plaquenil .   **HTN:  currently normotensive, on home meds.   **Anemia:  normocytic.  Hb in  10-11s currently.  10/2023 iron ok.  No indication for ESA.   WIll follow, reach out with concerns.  Manuelita DELENA Barters 12/24/2023, 9:02 AM

## 2023-12-24 NOTE — Consult Note (Signed)
 Gastroenterology Consult   Referring Provider: No ref. provider found Primary Care Physician:  Wendolyn Jenkins Jansky, MD Primary Gastroenterologist:  Glendia Holt, MD  Patient ID: Herbert Morrison; [id number]; 10-08-1945   Admit date: 12/23/2023  LOS: 0 days   Date of Consultation: 12/24/2023  Reason for Consultation:  uncontrollable diarrhea    History of Present Illness   Herbert Morrison is a 78 y.o. 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 He was admitted in 04/2023 with rectal bleeding and drop in Hgb from 10 to 7, received one unit of prbcs, and CTA was negative. Eliquis was discontinued. Admitted in 05/2023 with generalized weakness and altered MS attributed to dehydration/high output diarrhea. Cre 3.9 on admission, improved to 1.9 at discharged, baseline Cre prior to colon resection was 1.4. Patient presenting to ED with mental status changes (confusion/slurred speech). GI consulted for chronic diarrhea.  In ED: Hgb 11.7 (12.2 10/2023), Cre 3.64 (2.42 10/2023) prior to colectomy his Cre was in the 1.4 range. Lipase 56. Ammonia 30. Today: Hgb 10.4, Cre 3.36, K 3.2.  GI consult: longstanding history of UC. States he was doing well, in remission, not really requiring any medication leading up to his colon surgery. Had been years since on maintenance UC medications. Chronically had been on prednisone  for lupus, this was discontinued at some point after his colon surgery.   Completed laparoscopic colectomy with ileal rectosigmoid anastomosis 04/06/2023. States he has never fully recovered. Suffers from chronic diarrhea, fatigue which can put him in bed for days. Never has a solid stool. Stools typically watery but nonbloody. Tries to avoid  dairy. Cut out ice cream. No recent antibiotics. No NSAIDs/ASA. Has been off prednisone  at least six months with exception of medrol dose tapers couple of times for lupus.  Can have 4-8 watery stools daily. Notes urgency. Appetite is diminished. Some nausea. Takes reglan  when needed. Has lost 40 pounds since his surgery.   Follows closelty with Dr. Eloy, last seen virtually 10/2023. Advised imodium  8 tabs/day and adding lomotil  prn, 1-2 tabs q6hrs. Plans for flex sig in 02/2024. Advised to reduce dairy, sugary foods, artificial sweeteners, increase lean protein.  Patient states he is consistently taking imodium  2mg  TID and Lomotil  1 TID. He has not tried maximum dose prescribed (finds it difficult to remember QID dosing). Hates taking medication.   Last night had six stools during the night.   As far as abdominal pain, he has chronic right flank pain sometimes migrates to left. Seems to be tied to his bowel movements and gas. Dealing with this for years. Nothing new to him. No heartburn, dysphagia. Had paraesophageal hernia repair in 2023.   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  Colonoscopy 02/2023: -one 15mm polyp in cecum removed, s/p clips -one 20mm polyp in cecum removed, s/p clips -one 12mm polyp in ascending colon removed -one 18mm polyp in transverse colon removed, s/p clips -numerous additional flat, large and indistinct polyps in right colon. Given size and indistinct borders, endoscopic resection  would be difficult and high risk.  -moderate diverticulosis in sigmoid colon and desc colon -distal rectum and anal verge normal -sessile  serrated adenomas   Prior to Admission medications   Medication Sig Start Date End Date Taking? Authorizing Provider  acetaminophen  (TYLENOL ) 500 MG tablet Take 1,000 mg by mouth every 6 (six) hours as needed for mild pain (pain score 1-3).   Yes [provider]  amLODipine  (NORVASC ) 10 MG tablet Take 1 tablet every day by oral route.   Yes [provider]  buPROPion  (WELLBUTRIN  XL) 300 MG 24 hr tablet Take 1 tablet (300 mg total) by mouth in the morning. 12/07/23  Yes Wendolyn Jenkins Jansky, MD  Cyanocobalamin 3000 MCG CAPS Take 1 capsule by mouth daily.   Yes [provider]  diphenoxylate -atropine  (LOMOTIL ) 2.5-0.025 MG tablet Take 1-2 tablets by mouth 4 (four) times daily as needed for diarrhea or loose stools. Patient taking differently: Take 1 tablet by mouth in the morning, at noon, and at bedtime. 06/18/23  Yes Stacia Glendia BRAVO, MD  escitalopram  (LEXAPRO ) 20 MG tablet Take 20 mg by mouth in the morning.   Yes [provider]  ferrous sulfate  325 (65 FE) MG tablet Take 1 tablet every day by oral route.   Yes [provider]  fluticasone  (FLONASE ) 50 MCG/ACT nasal spray Place 2 sprays into both nostrils as needed for allergies or rhinitis.   Yes [provider]  hydrALAZINE  (APRESOLINE ) 100 MG tablet Take 100 mg by mouth 3 (three) times daily.   Yes [provider]  HYDROcodone -acetaminophen  (NORCO) 10-325 MG tablet Take 1 tablet by mouth every 6 (six) hours as needed for moderate pain (pain score 4-6). 09/11/23  Yes [provider]  hydroxychloroquine  (PLAQUENIL ) 200 MG tablet Take 200 mg by mouth 2 (two) times daily.   Yes [provider]  Inulin (FIBER CHOICE PO) Take 1 capsule by mouth daily.   Yes [provider]  ketoconazole  (NIZORAL ) 2 % shampoo Apply 1 Application topically every other day. Every other shower   Yes [provider]  levocetirizine (XYZAL) 5 MG tablet Take 10 mg by mouth daily.    Yes [provider]  LORazepam  (ATIVAN ) 2 MG tablet Take 2 mg by mouth in the morning and at bedtime.   Yes [provider]  meclizine  (ANTIVERT ) 25 MG tablet Take 25 mg by mouth 3 (three) times daily as needed (vertigo).   Yes [provider]  methocarbamol  (ROBAXIN ) 500 MG tablet Take 1 tablet (500 mg total) by mouth every 8 (eight) hours as needed for muscle spasms. 10/30/23  Yes Wendolyn Jenkins Jansky, MD  metoCLOPramide  (REGLAN ) 5 MG tablet Take 1 tablet (5 mg total) by mouth every 6 (six) hours as needed for nausea. Patient taking differently: Take 5 mg by mouth every 6 (six) hours as needed (gas/nausea). 12/02/23  Yes Stacia Glendia BRAVO, MD  ondansetron  (ZOFRAN ) 4 MG tablet Take 4 mg by mouth every 8 (eight) hours as needed for vomiting or nausea.   Yes [provider]  Probiotic Product (PROBIOTIC ADVANCED PO) Take 1 capsule by mouth daily.   Yes [provider]  SIMETHICONE  PO Take 1 tablet by mouth in the morning, at noon, and at bedtime.   Yes [provider]    Current Facility-Administered Medications  Medication Dose Route Frequency Provider Last Rate Last Admin   acetaminophen  (TYLENOL ) tablet 650 mg  650 mg Oral Q6H PRN Vicci Afton CROME, MD  Or   acetaminophen  (TYLENOL ) suppository 650 mg  650 mg Rectal Q6H PRN Johnson, Clanford L, MD       amLODipine  (NORVASC ) tablet 10 mg  10 mg Oral Daily Johnson, Clanford L, MD       buPROPion  (WELLBUTRIN  XL) 24 hr tablet 300 mg  300 mg Oral q AM Johnson, Clanford L, MD       diphenoxylate -atropine  (LOMOTIL ) 2.5-0.025 MG per tablet 1-2 tablet  1-2 tablet Oral QID PRN Johnson, Clanford L, MD       escitalopram  (LEXAPRO ) tablet 20 mg  20 mg Oral q AM Johnson, Clanford L, MD       fentaNYL  (SUBLIMAZE ) injection 12.5 mcg  12.5 mcg Intravenous Q2H PRN Johnson, Clanford L, MD       ferrous sulfate  tablet 325 mg  325 mg Oral Q breakfast Johnson, Clanford L, MD       heparin  injection 5,000  Units  5,000 Units Subcutaneous Q8H Johnson, Clanford L, MD   5,000 Units at 12/24/23 9385   hydrALAZINE  (APRESOLINE ) tablet 100 mg  100 mg Oral TID Johnson, Clanford L, MD       HYDROcodone -acetaminophen  (NORCO) 10-325 MG per tablet 1 tablet  1 tablet Oral Q6H PRN Johnson, Clanford L, MD       hydroxychloroquine  (PLAQUENIL ) tablet 200 mg  200 mg Oral BID Vicci, Clanford L, MD   200 mg at 12/23/23 2108   loratadine  (CLARITIN ) tablet 10 mg  10 mg Oral Daily Johnson, Clanford L, MD       LORazepam  (ATIVAN ) tablet 0.5 mg  0.5 mg Oral BID PRN Johnson, Clanford L, MD       ondansetron  (ZOFRAN ) tablet 4 mg  4 mg Oral Q6H PRN Johnson, Clanford L, MD       Or   ondansetron  (ZOFRAN ) injection 4 mg  4 mg Intravenous Q6H PRN Johnson, Clanford L, MD       sodium bicarbonate  150 mEq in dextrose  5 % 1,150 mL infusion   Intravenous Continuous Vicci, Clanford L, MD 75 mL/hr at 12/23/23 1956 New Bag at 12/23/23 1956   traZODone  (DESYREL ) tablet 25 mg  25 mg Oral QHS PRN Vicci Pen L, MD        Allergies as of 12/23/2023 - Review Complete 12/23/2023  Allergen Reaction Noted   Other Anaphylaxis and Other (See Comments) 03/01/2023   Remicade [infliximab] Anaphylaxis 09/30/2021   Sulfa antibiotics Anaphylaxis, Diarrhea, and Nausea And Vomiting 07/25/2022   Erythromycin Hives and Nausea And Vomiting 07/17/2014   Mesalamine  Other (See Comments) 12/07/2023   Pollen extract Other (See Comments) 11/05/2021   Sulfasalazine Nausea And Vomiting 01/23/2021   Grass pollen(k-o-r-t-swt vern) Other (See Comments) 11/05/2021    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   CKD (chronic kidney disease) stage 3, GFR 30-59 ml/min (HCC)    3b   Depression    Dyspnea    GERD (gastroesophageal reflux disease)    Grade I diastolic dysfunction 04/28/2023   Heart murmur    as a child   Hiatal hernia    History of hiatal hernia     Hypertension    Lupus 12/31/2020   Rheumatoid arthritis (HCC) 12/31/2020   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[facility])     Past Surgical History:  Procedure Laterality Date   BIOPSY  01/30/2021   Procedure: BIOPSY;  Surgeon: Golda Claudis PENNER, MD;  [facility];  Service: Endoscopy;;   CATARACT EXTRACTION Left 2023   COLONOSCOPY N/A 04/09/2016   Procedure: COLONOSCOPY;  Surgeon: Claudis RAYMOND Rivet, MD;  [facility];  Service: Endoscopy;  Laterality: N/A;  1:45   COLONOSCOPY WITH PROPOFOL  N/A 01/30/2021   Procedure: COLONOSCOPY WITH PROPOFOL ;  Surgeon: Rivet Claudis RAYMOND, MD;  [facility];  Service: Endoscopy;  Laterality: N/A;   COLONOSCOPY WITH PROPOFOL  N/A 02/17/2023   Procedure: COLONOSCOPY WITH PROPOFOL ;  Surgeon: Stacia Glendia BRAVO, MD;  Location: [facility];  Service: Gastroenterology;  Laterality: N/A;   ELBOW BURSA SURGERY Left 1983   ESOPHAGOGASTRODUODENOSCOPY (EGD) WITH PROPOFOL  N/A 01/30/2021   Procedure: ESOPHAGOGASTRODUODENOSCOPY (EGD) WITH PROPOFOL ;  Surgeon: Rivet Claudis RAYMOND, MD;  [facility];  Service: Endoscopy;  Laterality: N/A;  2:00, pt knows to arrive at 11:00   HEMOSTASIS CLIP PLACEMENT  02/17/2023   Procedure: HEMOSTASIS CLIP PLACEMENT;  Surgeon: Stacia Glendia BRAVO, MD;  [facility]: [facility];  Service: Gastroenterology;;   KNEE ARTHROSCOPY Right 1979   left elbow     spur removed   left knee surgery     open surgery for a meniscus tear 1970's   lungs  07/2022   thoracentesis   PARTIAL COLECTOMY  2024   polyps   POLYPECTOMY  04/09/2016   Procedure: POLYPECTOMY;  Surgeon: Claudis RAYMOND Rivet, MD;  [facility];  Service: Endoscopy;;  cecal polyp   POLYPECTOMY  01/30/2021   Procedure: POLYPECTOMY;  Surgeon: Rivet Claudis RAYMOND, MD;  [facility];  Service: Endoscopy;;   POLYPECTOMY N/A 02/17/2023   Procedure: POLYPECTOMY;  Surgeon: Stacia Glendia BRAVO, MD;  Location: [facility];  Service: Gastroenterology;   Laterality: N/A;   SUBMUCOSAL LIFTING INJECTION  02/17/2023   Procedure: SUBMUCOSAL LIFTING INJECTION;  Surgeon: Stacia Glendia BRAVO, MD;  Location: [facility];  Service: Gastroenterology;;   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   Procedure: LEFT TOTAL HIP ARTHROPLASTY ANTERIOR APPROACH;  Surgeon: Ernie Cough, MD;  Location: [facility];  Service: Orthopedics;  Laterality: Left;  70 mins   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   Procedure: TOTAL KNEE ARTHROPLASTY;  Surgeon: Ernie Cough, MD;  Location: [facility];  Service: Orthopedics;  Laterality: Left;  70 mins   UMBILICAL HERNIA REPAIR N/A 05/09/2022   Procedure: PRIMARY UMBILICAL HERNIA REPAIR;  Surgeon: Sheldon Standing, MD;  Location: [facility];  Service: General;  Laterality: N/A;   XI ROBOTIC ASSISTED PARAESOPHAGEAL HERNIA REPAIR N/A 05/09/2022   Procedure: ROBOTIC REPAIR OF PARAESOPHAGEAL HIATAL HERNIA WITH FUNDOPLICATION;  Surgeon: Sheldon Standing, MD;  Location: [facility];  Service: General;  Laterality: N/A;  GEN w/ERAS LOCAL    Family History  Problem Relation Age of Onset   Leukemia Father    Bladder Cancer Father    Stomach cancer Neg Hx    Esophageal cancer Neg Hx    Colon cancer Neg Hx    Pancreatic cancer Neg Hx    Colon polyps Neg Hx    Rectal cancer Neg Hx     Social History   Socioeconomic History   Marital status: Married    Spouse name: Not on file   Number of children: 2   Years of education: Not on file   Highest education level: Bachelor's degree (e.g., BA, AB, BS)  Occupational History   Not on file  Tobacco Use   Smoking status: Former    Current packs/day: 0.00    Average packs/day: 1 pack/day for 5.0 years (  5.0 ttl pk-yrs)    Types: Cigarettes    Start date: 56    Quit date: 22    Years since quitting: 50.6   Smokeless tobacco: Former    Types: Chew    Quit date: 01/22/1965   Tobacco comments:    quit 43 years ago  Vaping Use   Vaping status: Never Used  Substance and Sexual Activity    Alcohol use: Not Currently    Comment: none   Drug use: No   Sexual activity: Yes  Other Topics Concern   Not on file  Social History Narrative   No grands   [place] -3 yrs   Retired -Secondary school teacher, massage therapy   Social Drivers of Health   Financial Resource Strain: Low Risk  (09/25/2023)   Overall Financial Resource Strain (CARDIA)    Difficulty of Paying Living Expenses: Not very hard  Food Insecurity: No Food Insecurity (12/23/2023)   Hunger Vital Sign    Worried About Running Out of Food in the Last Year: Never true    Ran Out of Food in the Last Year: Never true  Transportation Needs: No Transportation Needs (12/23/2023)   PRAPARE - Administrator, Civil Service (Medical): No    Lack of Transportation (Non-Medical): No  Physical Activity: Insufficiently Active (09/25/2023)   Exercise Vital Sign    Days of Exercise per Week: 3 days    Minutes of Exercise per Session: 10 min  Stress: Stress Concern Present (09/25/2023)   [employer] of Occupational Health - Occupational Stress Questionnaire    Feeling of Stress : To some extent  Social Connections: Socially Integrated (12/23/2023)   Social Connection and Isolation Panel    Frequency of Communication with Friends and Family: More than three times a week    Frequency of Social Gatherings with Friends and Family: More than three times a week    Attends Religious Services: 1 to 4 times per year    Active Member of [employer] or Organizations: No    Attends Engineer, structural: 1 to 4 times per year    Marital Status: Married  Catering manager Violence: Not At Risk (12/23/2023)   Humiliation, Afraid, Rape, and Kick questionnaire    Fear of Current or Ex-Partner: No    Emotionally Abused: No    Physically Abused: No    Sexually Abused: No     Review of System:   General: Negative for fever, chills, fatigue, +weakness, +weight loss, +diminished appetite Eyes: Negative for vision changes.  ENT: Negative  for hoarseness, difficulty swallowing , nasal congestion. CV: Negative for chest pain, angina, palpitations, dyspnea on exertion, peripheral edema.  Respiratory: Negative for dyspnea at rest, dyspnea on exertion, cough, sputum, wheezing.  GI: See history of present illness. GU:  Negative for dysuria, hematuria, urinary incontinence, urinary frequency, nocturnal urination.  MS: +joint pain.  Derm: Negative for rash or itching.  Neuro: Negative for weakness, abnormal sensation, seizure, frequent headaches, memory loss, confusion. Presented with confusion, dementia like symptoms, slurred speech Psych: Negative for anxiety, depression, suicidal ideation, hallucinations.  Endo: Negative for unusual weight change.  Heme: Negative for bruising or bleeding. Allergy: Negative for rash or hives.      Physical Examination:   Vital signs in last 24 hours: Temp:  [97.4 F (36.3 C)-98.4 F (36.9 C)] 98.4 F (36.9 C) (08/14 0346) Pulse Rate:  [49-53] 51 (08/14 0346) Resp:  [11-18] 18 (08/14 0346) BP: (105-131)/(50-82) 127/64 (08/14 0346) SpO2:  [91 %-  99 %] 97 % (08/14 0346) Weight:  [80.5 kg-80.7 kg] 80.5 kg (08/13 1849) Last BM Date : 12/23/23  General: Well-nourished, well-developed in no acute distress. Wife at bedside. Speech normal. Feels back to his normal self as far as mental status changes Head: Normocephalic, atraumatic.   Eyes: Conjunctiva pink, no icterus. Mouth: Oropharyngeal mucosa moist and pink , no lesions erythema or exudate. Neck: Supple without thyromegaly, masses, or lymphadenopathy.  Lungs: Clear to auscultation bilaterally.  Heart: Regular rate and rhythm, no murmurs rubs or gallops.  Abdomen: Bowel sounds are normal, nondistended, no hepatosplenomegaly or masses, no abdominal bruits or hernia , no rebound or guarding.  Mild tenderness right abd Rectal: not performed Extremities: No lower extremity edema, clubbing, deformity.  Neuro: Alert and oriented x 4 , grossly  normal neurologically.  Skin: Warm and dry, no rash or jaundice.   Psych: Alert and cooperative, normal mood and affect.        Intake/Output from previous day: 08/13 0701 - 08/14 0700 In: 928.1 [P.O.:240; I.V.:688.1] Out: -  Intake/Output this shift: No intake/output data recorded.  Lab Results:   CBC Recent Labs    12/23/23 1438 12/24/23 0432  WBC 7.2 5.4  HGB 11.7* 10.4*  HCT 37.8* 33.1*  MCV 90.4 88.0  PLT 293 250   BMET Recent Labs    12/23/23 1438 12/24/23 0432  NA 137 140  K 4.0 3.2*  CL 113* 116*  CO2 16* 17*  GLUCOSE 75 94  BUN 48* 45*  CREATININE 3.64* 3.36*  CALCIUM  9.5 8.7*   LFT Recent Labs    12/23/23 1438 12/24/23 0432  BILITOT 0.6  --   ALKPHOS 49  --   AST 13*  --   ALT 10  --   PROT 6.8  --   ALBUMIN  4.0 3.4*    Lipase Recent Labs    12/23/23 1438  LIPASE 56*    PT/INR No results for input(s): LABPROT, INR in the last 72 hours.   Hepatitis Panel No results for input(s): HEPBSAG, HCVAB, HEPAIGM, HEPBIGM in the last 72 hours.   Imaging Studies:   CT Head Wo Contrast Result Date: 12/23/2023 EXAM: CT HEAD WITHOUT CONTRAST 12/23/2023 04:44:55 PM TECHNIQUE: CT of the head was performed without the administration of intravenous contrast. Automated exposure control, iterative reconstruction, and/or weight based adjustment of the mA/kV was utilized to reduce the radiation dose to as low as reasonably achievable. COMPARISON: 04/26/2023 CLINICAL HISTORY: Mental status change, unknown cause. FINDINGS: BRAIN AND VENTRICLES: No acute hemorrhage. Gray-white differentiation is preserved. No hydrocephalus. No extra-axial collection. No mass effect or midline shift. Mild periventricular white matter disease. ORBITS: No acute abnormality. SINUSES: No acute abnormality. SOFT TISSUES AND SKULL: No acute soft tissue abnormality. No skull fracture. IMPRESSION: 1. No acute intracranial abnormality. 2. Mild periventricular white matter disease.  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 GI consulted for chronic diarrhea.  Chronic diarrhea: Long-standing UC, essentially in remission for years without using chronic maintenance medications but has been on chronic prednisone  for lupus up until about six months ago. Since subtotal colectomy with ileal rectosigmoid  anastomosis 03/2023 he has had intractable diarrhea with need for hospitalization for dehydration/electrolyte derangements, has noted decline in CKD. Has been following closely with Dr. Stacia (primary GI) and instructed to utilize Lomotil  1-2 every 6 hours, up to 8 imodium  per day. Patient consistently taking imodium  2mg  TID, lomotil  1 tablet TID and having 6 plus watery stools daily. Stool studies negative for Cdiff and neg GI panel in 05/2023. Stools are nonbloody. It is unclear if diarrhea related to UC flare (as he has distal sigmoid/rectum), infectious etiology (CMV needs to be ruled out), surgically altered bowel, adrenal insufficiency. TSH normal in June.   Anemia: first noted 04/2023, with Hgb persistently in the 8-10 range. Low iron (22) and ferritin (13.5) 06/2023 but improved with normal values 10/2023 (ferritin 58, iron 56). No overt GI bleeding.   Weight loss: 220 pounds in 2023. In 08/2022, 197 pounds. In 06/2023, 190 pounds. Currently 177 pounds.   Plan:   Cdiff, GI panel, fecal calprotectin. Folate, B12, iron/tibc/ferritin. Fasting AM cortisol.  Flex sig tomorrow. Will need bowel prep  as he had solid food today.  I have discussed the risks, alternatives, benefits with regards to but not limited to the risk of reaction to medication, bleeding, infection, perforation and the patient is agreeable to proceed. Written consent to be obtained. Will hold lomotil , loperamide  while prepping.  Will hold heparin  after midnight.    LOS: 0 days   We would like to thank you for the opportunity to participate in the care of JEP DYAS.  Sonny RAMAN. Ezzard RIGGERS [facility] [phone] 8/14/20257:32 AM

## 2023-12-24 NOTE — Progress Notes (Signed)
 PROGRESS NOTE   Herbert Morrison  FMW:[id number] DOB: 12/15/1945 DOA: 12/23/2023 PCP: Wendolyn Jenkins Jansky, MD   Chief Complaint  Patient presents with   Flank Pain   Altered Mental Status   Level of care: Telemetry  Brief Admission History:  78 year old male with ulcerative colitis, hypertension, rheumatoid arthritis, stage IV CKD, chronic uncontrolled diarrhea managed with Lomotil  and Imodium , depression and anxiety on multiple medications, back pain presented to the ED by referral from PCP for symptoms of right-sided flank pain and dysuria since yesterday and having confusion at home with delirium.  Wife says he has been seeing people that are not present.  He forgot how to use the remote control.  He has been generally more weak and having difficulty getting out of bed in the last 24 hours.  He has also had some slurring speech and difficulty walking.  In the ED he was noted to be very dry with dry mucous membranes and his bladder showed a very minimal amount of urine.  He was given IV fluids.  His creatinine seems to be more elevated than normal with an elevated BUN level.  His ammonia level was within normal limits.  His CT head without contrast was within normal limits.  His urinalysis did not show evidence of infection.  Admission was requested for further management of acute metabolic encephalopathy secondary to acute on chronic renal failure.   Assessment and Plan:  AKI on CKD stage IV -- some prerenal component to this as he has chronic diarrhea and is clinically very dehydrated -- continue IV fluid hydration  -- daily renal function panel -- check renal US  -- right renal cysts noted, no hydronephrosis or obstruction seen   Metabolic Acidosis -- sodium bicarbonate  IV fluids ordered, continue for another 24 hours -- recheck renal function panel in AM    Dehydration  -- IV fluids ordered  -- monitoring intake/output    Acute metabolic Encephalopathy - Improving -- stable neuro  checks -- DC  -- CT head negative for acute findings -- treating AKI on CKD as above  -- if persists, would get MRI brain -- delirium precautions -- greatly reduce and / or eliminate sedating medications   Essential hypertension  -- resumed home BP lowering medications   Anemia in CKD -- resumed home daily iron supplementation    Depression/Anxiety  PTSD -- resumed home behavioral health medications    Rheumatoid Arthritis Lupus  -- resume home plaquenil     Ulcerative Colitis Chronic uncontrolled Diarrhea -- Lomotil  ordered PRN -- likely is the reason he is so dehydrated -- will ask GI to assist with getting diarrhea control  -- wife reports most of colon has already been removed    Hyperlipidemia CAD -- check fasting lipid panel in AM    DVT prophylaxis: sq heparin   Code Status: Full  Family Communication: wife at bedside updated daily  Disposition: TBD    Consultants:  Nephrology GI  Procedures:   Antimicrobials:    Subjective: Pt reports feeling a little better after IV fluids, still does not feel back to baseline and still having intermittent episodes of confusion.  He is eating a little better today.   Objective: Vitals:   12/23/23 2109 12/24/23 0346 12/24/23 0849 12/24/23 1412  BP: (!) 118/58 127/64 (!) 153/80 127/61  Pulse:  (!) 51  (!) 52  Resp:  18  20  Temp:  98.4 F (36.9 C)  98.3 F (36.8 C)  TempSrc:  Oral  Oral  SpO2:  97%  97%  Weight:      Height:        Intake/Output Summary (Last 24 hours) at 12/24/2023 1506 Last data filed at 12/24/2023 1300 Gross per 24 hour  Intake 1618.08 ml  Output --  Net 1618.08 ml   Filed Weights   12/23/23 1427 12/23/23 1849  Weight: 80.7 kg 80.5 kg   Examination:  General exam: Appears calm and comfortable  Respiratory system: Clear to auscultation. Respiratory effort normal. Cardiovascular system: normal S1 & S2 heard. No JVD, murmurs, rubs, gallops or clicks. No pedal edema. Gastrointestinal  system: Abdomen is nondistended, soft and nontender. No organomegaly or masses felt. Normal bowel sounds heard. Central nervous system: Alert and oriented. No focal neurological deficits. Extremities: Symmetric 5 x 5 power. Skin: No rashes, lesions or ulcers. Psychiatry: Judgement and insight appear normal. Mood & affect appropriate.   Data Reviewed: I have personally reviewed following labs and imaging studies  CBC: Recent Labs  Lab 12/23/23 1438 12/24/23 0432  WBC 7.2 5.4  NEUTROABS 5.0  --   HGB 11.7* 10.4*  HCT 37.8* 33.1*  MCV 90.4 88.0  PLT 293 250    Basic Metabolic Panel: Recent Labs  Lab 12/23/23 1438 12/24/23 0432 12/24/23 0750  NA 137 140  --   K 4.0 3.2*  --   CL 113* 116*  --   CO2 16* 17*  --   GLUCOSE 75 94  --   BUN 48* 45*  --   CREATININE 3.64* 3.36*  --   CALCIUM  9.5 8.7*  --   MG  --   --  2.3  PHOS  --  3.5  --     CBG: Recent Labs  Lab 12/23/23 1534  GLUCAP 90    No results found for this or any previous visit (from the past 240 hours).   Radiology Studies: US  RENAL Result Date: 12/24/2023 CLINICAL DATA:  Acute kidney injury. EXAM: RENAL / URINARY TRACT ULTRASOUND COMPLETE COMPARISON:  May 15, 2023. FINDINGS: Right Kidney: Renal measurements: 12.0 x 5.5 x 5.6 cm = volume: 198 mL. At least 3 cysts are noted, the largest measuring 3.8 cm in the upper pole. Echogenicity within normal limits. No mass or hydronephrosis visualized. Left Kidney: Renal measurements: 11.5 x 5.3 x 5.3 cm = volume: 176 mL. Echogenicity within normal limits. No mass or hydronephrosis visualized. Bladder: Appears normal for degree of bladder distention. Calculated prevoid volume of 81 mL. Other: None. IMPRESSION: Right renal cysts are noted. No hydronephrosis or renal obstruction is noted. Electronically Signed   By: Lynwood Landy Raddle M.D.   On: 12/24/2023 10:55   CT Head Wo Contrast Result Date: 12/23/2023 EXAM: CT HEAD WITHOUT CONTRAST 12/23/2023 04:44:55 PM TECHNIQUE:  CT of the head was performed without the administration of intravenous contrast. Automated exposure control, iterative reconstruction, and/or weight based adjustment of the mA/kV was utilized to reduce the radiation dose to as low as reasonably achievable. COMPARISON: 04/26/2023 CLINICAL HISTORY: Mental status change, unknown cause. FINDINGS: BRAIN AND VENTRICLES: No acute hemorrhage. Gray-white differentiation is preserved. No hydrocephalus. No extra-axial collection. No mass effect or midline shift. Mild periventricular white matter disease. ORBITS: No acute abnormality. SINUSES: No acute abnormality. SOFT TISSUES AND SKULL: No acute soft tissue abnormality. No skull fracture. IMPRESSION: 1. No acute intracranial abnormality. 2. Mild periventricular white matter disease. Electronically signed by: evalene coho 12/23/2023 06:00 PM EDT RP Workstation: [id number]    Scheduled Meds:  amLODipine   10 mg Oral  Daily   buPROPion   300 mg Oral q AM   diphenoxylate -atropine   2 tablet Oral TID AC   escitalopram   20 mg Oral q AM   feeding supplement  237 mL Oral BID BM   ferrous sulfate   325 mg Oral Q breakfast   heparin   5,000 Units Subcutaneous Q8H   hydrALAZINE   100 mg Oral TID   hydroxychloroquine   200 mg Oral BID   loperamide   4 mg Oral TID   loratadine   10 mg Oral Daily   Continuous Infusions:  sodium bicarbonate  150 mEq in dextrose  5 % 1,150 mL infusion 75 mL/hr at 12/24/23 1105    LOS: 0 days   Time spent: 56 mins  Kolston Lacount Vicci, MD How to contact the [facility] Attending or Consulting provider 7A - 7P or covering provider during after hours 7P -7A, for this patient?  Check the care team in [facility] and look for a) attending/consulting TRH provider listed and b) the TRH team listed Log into [email or web address] to find provider on call.  Locate the TRH provider you are looking for under [facility] and page to a number that you can be directly reached. If you still have difficulty reaching the  provider, please page the [facility] (Director on Call) for the Hospitalists listed on amion for assistance.  12/24/2023, 3:06 PM

## 2023-12-24 NOTE — Plan of Care (Signed)

## 2023-12-25 ENCOUNTER — Encounter (HOSPITAL_COMMUNITY): Payer: Self-pay | Admitting: Family Medicine

## 2023-12-25 ENCOUNTER — Inpatient Hospital Stay (HOSPITAL_COMMUNITY): Admitting: Certified Registered"

## 2023-12-25 ENCOUNTER — Encounter (HOSPITAL_COMMUNITY)
Admission: EM | Disposition: A | Discharge status: Home / Self Care | Payer: Self-pay | Source: Ambulatory Visit | Attending: Family Medicine

## 2023-12-25 DIAGNOSIS — K529 Noninfective gastroenteritis and colitis, unspecified: Secondary | ICD-10-CM

## 2023-12-25 DIAGNOSIS — E782 Mixed hyperlipidemia: Secondary | ICD-10-CM | POA: Diagnosis not present

## 2023-12-25 DIAGNOSIS — I2510 Atherosclerotic heart disease of native coronary artery without angina pectoris: Secondary | ICD-10-CM

## 2023-12-25 DIAGNOSIS — K648 Other hemorrhoids: Secondary | ICD-10-CM

## 2023-12-25 DIAGNOSIS — K621 Rectal polyp: Secondary | ICD-10-CM | POA: Diagnosis not present

## 2023-12-25 DIAGNOSIS — Z980 Intestinal bypass and anastomosis status: Secondary | ICD-10-CM | POA: Diagnosis not present

## 2023-12-25 DIAGNOSIS — G9341 Metabolic encephalopathy: Secondary | ICD-10-CM | POA: Diagnosis not present

## 2023-12-25 DIAGNOSIS — E860 Dehydration: Secondary | ICD-10-CM | POA: Diagnosis not present

## 2023-12-25 DIAGNOSIS — K5120 Ulcerative (chronic) proctitis without complications: Secondary | ICD-10-CM

## 2023-12-25 DIAGNOSIS — Z8719 Personal history of other diseases of the digestive system: Secondary | ICD-10-CM | POA: Diagnosis not present

## 2023-12-25 DIAGNOSIS — D128 Benign neoplasm of rectum: Secondary | ICD-10-CM

## 2023-12-25 DIAGNOSIS — N179 Acute kidney failure, unspecified: Secondary | ICD-10-CM | POA: Diagnosis not present

## 2023-12-25 HISTORY — PX: FLEXIBLE SIGMOIDOSCOPY: SHX5431

## 2023-12-25 LAB — CBC
HCT: 32.8 % — ABNORMAL LOW (ref 39.0–52.0)
Hemoglobin: 10.6 g/dL — ABNORMAL LOW (ref 13.0–17.0)
MCH: 28.1 pg (ref 26.0–34.0)
MCHC: 32.3 g/dL (ref 30.0–36.0)
MCV: 87 fL (ref 80.0–100.0)
Platelets: 270 K/uL (ref 150–400)
RBC: 3.77 MIL/uL — ABNORMAL LOW (ref 4.22–5.81)
RDW: 16.2 % — ABNORMAL HIGH (ref 11.5–15.5)
WBC: 5.9 K/uL (ref 4.0–10.5)
nRBC: 0 % (ref 0.0–0.2)

## 2023-12-25 LAB — RENAL FUNCTION PANEL
Albumin: 3.2 g/dL — ABNORMAL LOW (ref 3.5–5.0)
Anion gap: 6 (ref 5–15)
BUN: 31 mg/dL — ABNORMAL HIGH (ref 8–23)
CO2: 23 mmol/L (ref 22–32)
Calcium: 8.8 mg/dL — ABNORMAL LOW (ref 8.9–10.3)
Chloride: 111 mmol/L (ref 98–111)
Creatinine, Ser: 2.78 mg/dL — ABNORMAL HIGH (ref 0.61–1.24)
GFR, Estimated: 23 mL/min — ABNORMAL LOW (ref >60)
Glucose, Bld: 99 mg/dL (ref 70–99)
Phosphorus: 2.2 mg/dL — ABNORMAL LOW (ref 2.5–4.6)
Potassium: 3.1 mmol/L — ABNORMAL LOW (ref 3.5–5.1)
Sodium: 140 mmol/L (ref 135–145)

## 2023-12-25 LAB — GASTROINTESTINAL PANEL BY PCR, STOOL (REPLACES STOOL CULTURE)

## 2023-12-25 LAB — C-REACTIVE PROTEIN: CRP: 0.6 mg/dL (ref <1.0)

## 2023-12-25 LAB — SEDIMENTATION RATE: Sed Rate: 9 mm/h (ref 0–20)

## 2023-12-25 SURGERY — SIGMOIDOSCOPY, FLEXIBLE
Anesthesia: General

## 2023-12-25 MED ORDER — LIDOCAINE 2% (20 MG/ML) 5 ML SYRINGE
INTRAMUSCULAR | Status: DC | PRN
  Administered 2023-12-25: 60 mg via INTRAVENOUS

## 2023-12-25 MED ORDER — LACTATED RINGERS IV SOLN: INTRAVENOUS | Status: DC

## 2023-12-25 MED ORDER — LACTATED RINGERS IV SOLN: INTRAVENOUS | Status: DC | PRN

## 2023-12-25 MED ORDER — DIPHENOXYLATE-ATROPINE 2.5-0.025 MG PO TABS
2.0000 | ORAL_TABLET | Freq: Four times a day (QID) | ORAL | Status: DC
  Administered 2023-12-25 – 2023-12-26 (×4): 2 via ORAL
  Filled 2023-12-25 (×4): qty 2

## 2023-12-25 MED ORDER — POTASSIUM CHLORIDE 20 MEQ PO PACK
40.0000 meq | PACK | Freq: Once | ORAL | Status: AC
  Administered 2023-12-25: 40 meq via ORAL
  Filled 2023-12-25 (×2): qty 2

## 2023-12-25 MED ORDER — LOPERAMIDE HCL 2 MG PO CAPS
2.0000 mg | ORAL_CAPSULE | ORAL | Status: DC | PRN
  Administered 2023-12-25 (×2): 2 mg via ORAL
  Filled 2023-12-25 (×2): qty 1

## 2023-12-25 MED ORDER — SODIUM CHLORIDE 0.9 % IV SOLN: INTRAVENOUS | Status: DC

## 2023-12-25 MED ORDER — PROPOFOL 10 MG/ML IV BOLUS
INTRAVENOUS | Status: DC | PRN
Start: 2023-12-25 — End: 2023-12-25
  Administered 2023-12-25: 50 mg via INTRAVENOUS
  Administered 2023-12-25: 75 ug/kg/min via INTRAVENOUS

## 2023-12-25 MED ORDER — POTASSIUM CHLORIDE 10 MEQ/100ML IV SOLN
10.0000 meq | INTRAVENOUS | Status: AC
  Administered 2023-12-25 (×3): 10 meq via INTRAVENOUS
  Filled 2023-12-25 (×3): qty 100

## 2023-12-25 NOTE — TOC Initial Note (Signed)
 Transition of Care [facility]) - Initial/Assessment Note    Patient Details  Name: Herbert Morrison MRN: [id number] Date of Birth: [birth year]  Transition of [facility]) CM/SW Contact:    Lucie Lunger, LCSWA Phone Number: 12/25/2023, 12:10 PM  Clinical Narrative:                 Pt is high risk for readmission. CSW spoke with pts spouse to complete assessment. Pt is independent in completing his ADLs. Pt does not use any DME and has not had HH in the past. TOC to follow.  Expected Discharge Plan: Home/Self Care Barriers to Discharge: Continued Medical Work up   Patient Goals and CMS Choice Patient states their goals for this hospitalization and ongoing recovery are:: return home CMS Medicare.gov Compare Post Acute Care list provided to:: Patient Choice offered to / list presented to : Patient      Expected Discharge Plan and Services In-house Referral: Clinical Social Work Discharge Planning Services: CM Consult   Living arrangements for the past 2 months: Single Family Home                                      Prior Living Arrangements/Services Living arrangements for the past 2 months: Single Family Home Lives with:: Spouse Patient language and need for interpreter reviewed:: Yes Do you feel safe going back to the place where you live?: Yes      Need for Family Participation in Patient Care: Yes (Comment) Care giver support system in place?: Yes (comment)   Criminal Activity/Legal Involvement Pertinent to Current Situation/Hospitalization: No - Comment as needed  Activities of Daily Living   ADL Screening (condition at time of admission) Independently performs ADLs?: Yes (appropriate for developmental age) Is the patient deaf or have difficulty hearing?: No Does the patient have difficulty seeing, even when wearing glasses/contacts?: No Does the patient have difficulty concentrating, remembering, or making decisions?: No  Permission Sought/Granted                   Emotional Assessment Appearance:: Appears stated age Attitude/Demeanor/Rapport: Engaged Affect (typically observed): Accepting Orientation: : Oriented to Self, Oriented to Place, Oriented to  Time, Oriented to Situation Alcohol / Substance Use: Not Applicable Psych Involvement: No (comment)  Admission diagnosis:  Disorientation [R41.0] AKI (acute kidney injury) (HCC) [N17.9] Patient Active Problem List   Diagnosis Date Noted   Dehydration 12/23/2023   Stage 4 chronic kidney disease (HCC) 11/01/2023   Metabolic encephalopathy 05/15/2023   AKI (acute kidney injury) (HCC) 05/15/2023   Diarrhea 05/15/2023   Rectal bleeding 04/28/2023   ABLA (acute blood loss anemia) 04/28/2023   Grade I diastolic dysfunction 04/28/2023   Symptomatic anemia 03/02/2023   Benign neoplasm of colon 02/17/2023   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/2022   Anxiety 04/24/2022   Depression 04/24/2022   PTSD (post-traumatic stress disorder) 04/24/2022   Rheumatoid arthritis (HCC) 04/24/2022   Lupus (systemic lupus erythematosus) (HCC) 04/24/2022   Coronary arteriosclerosis 09/05/2021   Mixed hyperlipidemia 09/05/2021   Encounter for vitamin deficiency screening 01/10/2021   S/P left TKA 08/11/2019   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   History of total knee arthroplasty 08/11/2019   Overweight (BMI 25.0-29.9) 03/25/2017   History of knee replacement 03/24/2017   History of repair of hip joint 03/24/2017   GERD (gastroesophageal reflux disease) 01/29/2016   Ulcerative pancolitis without complication (HCC) 07/17/2014  Essential hypertension 07/17/2014   PCP:  Wendolyn Jenkins Jansky, MD Pharmacy:   [facility], [facility] - [place], [place] - [address] [place] [address] Phone: [phone] Fax: [phone]  [facility] [place], [place] - [address] [place]  [address] Phone: [phone] Fax: [phone]     Social Drivers of Health (SDOH) Social History: SDOH Screenings   Food Insecurity: No Food Insecurity (12/23/2023)  Housing: Low Risk  (12/23/2023)  Transportation Needs: No Transportation Needs (12/23/2023)  Utilities: Not At Risk (12/23/2023)  Depression (PHQ2-9): High Risk (09/29/2023)  Financial Resource Strain: Low Risk  (09/25/2023)  Physical Activity: Insufficiently Active (09/25/2023)  Social Connections: Socially Integrated (12/23/2023)  Stress: Stress Concern Present (09/25/2023)  Tobacco Use: Medium Risk (12/25/2023)  Health Literacy: Low Risk  (04/28/2023)   Received from [facility]   SDOH Interventions:     Readmission Risk Interventions    12/25/2023   12:08 PM 05/11/2022   11:50 AM  Readmission Risk Prevention Plan  Post Dischage Appt  Complete  Medication Screening  Complete  Transportation Screening Complete Complete  HRI or Home Care Consult Complete   Social Work Consult for Recovery Care Planning/Counseling Complete   Palliative Care Screening Not Applicable   Medication Review Oceanographer) Complete

## 2023-12-25 NOTE — Progress Notes (Signed)
 [facility] Progress Note   Subjective:   Going for flex sig this AM - wife present.  No new issues.  Feeling much improved with IVF.   Objective Vitals:   12/24/23 1932 12/25/23 0053 12/25/23 0443 12/25/23 0806  BP: 133/65 (!) 151/72 125/80 (!) 143/73  Pulse: (!) 53  (!) 49   Resp: 18  18   Temp: 98.3 F (36.8 C)  97.9 F (36.6 C)   TempSrc: Oral  Oral   SpO2: 94%  95%   Weight:      Height:       Physical Exam General: in wheelchair looking well Heart: RRR Lungs: normal WOB on RA Extremities: no edema Neuro: conversant, nonfocal   Additional Objective Labs: Basic Metabolic Panel: Recent Labs  Lab 12/23/23 1438 12/24/23 0432 12/25/23 0430  NA 137 140 140  K 4.0 3.2* 3.1*  CL 113* 116* 111  CO2 16* 17* 23  GLUCOSE 75 94 99  BUN 48* 45* 31*  CREATININE 3.64* 3.36* 2.78*  CALCIUM  9.5 8.7* 8.8*  PHOS  --  3.5 2.2*   Liver Function Tests: Recent Labs  Lab 12/23/23 1438 12/24/23 0432 12/25/23 0430  AST 13*  --   --   ALT 10  --   --   ALKPHOS 49  --   --   BILITOT 0.6  --   --   PROT 6.8  --   --   ALBUMIN  4.0 3.4* 3.2*   Recent Labs  Lab 12/23/23 1438  LIPASE 56*   CBC: Recent Labs  Lab 12/23/23 1438 12/24/23 0432 12/25/23 0430  WBC 7.2 5.4 5.9  NEUTROABS 5.0  --   --   HGB 11.7* 10.4* 10.6*  HCT 37.8* 33.1* 32.8*  MCV 90.4 88.0 87.0  PLT 293 250 270   Blood Culture    Component Value Date/Time   SDES  05/16/2023 0036    BLOOD BLOOD LEFT ARM AEROBIC BOTTLE ONLY Performed at [facility], [address], [place], [place] [address]    SPECREQUEST  05/16/2023 0036    BOTTLES DRAWN AEROBIC ONLY Blood Culture results may not be optimal due to an inadequate volume of blood received in culture bottles Performed at [facility], [address] [place], [place] [address]    CULT  05/16/2023 0036    NO GROWTH 5 DAYS Performed at [facility], [address] [place], [place]  [address]    REPTSTATUS 05/21/2023 FINAL 05/16/2023 0036    Cardiac Enzymes: No results for input(s): CKTOTAL, CKMB, CKMBINDEX, TROPONINI in the last 168 hours. CBG: Recent Labs  Lab 12/23/23 1534  GLUCAP 90   Iron Studies: No results for input(s): IRON, TIBC, TRANSFERRIN, FERRITIN in the last 72 hours. @lablastinr3 @ Studies/Results: US  RENAL Result Date: 12/24/2023 CLINICAL DATA:  Acute kidney injury. EXAM: RENAL / URINARY TRACT ULTRASOUND COMPLETE COMPARISON:  May 15, 2023. FINDINGS: Right Kidney: Renal measurements: 12.0 x 5.5 x 5.6 cm = volume: 198 mL. At least 3 cysts are noted, the largest measuring 3.8 cm in the upper pole. Echogenicity within normal limits. No mass or hydronephrosis visualized. Left Kidney: Renal measurements: 11.5 x 5.3 x 5.3 cm = volume: 176 mL. Echogenicity within normal limits. No mass or hydronephrosis visualized. Bladder: Appears normal for degree of bladder distention. Calculated prevoid volume of 81 mL. Other: None. IMPRESSION: Right renal cysts are noted. No hydronephrosis or renal obstruction is noted. Electronically Signed   By: Lynwood Seip  Jr M.D.   On: 12/24/2023 10:55   CT Head Wo Contrast Result Date: 12/23/2023 EXAM: CT HEAD WITHOUT CONTRAST 12/23/2023 04:44:55 PM TECHNIQUE: CT of the head was performed without the administration of intravenous contrast. Automated exposure control, iterative reconstruction, and/or weight based adjustment of the mA/kV was utilized to reduce the radiation dose to as low as reasonably achievable. COMPARISON: 04/26/2023 CLINICAL HISTORY: Mental status change, unknown cause. FINDINGS: BRAIN AND VENTRICLES: No acute hemorrhage. Gray-white differentiation is preserved. No hydrocephalus. No extra-axial collection. No mass effect or midline shift. Mild periventricular white matter disease. ORBITS: No acute abnormality. SINUSES: No acute abnormality. SOFT TISSUES AND SKULL: No acute soft tissue abnormality. No skull  fracture. IMPRESSION: 1. No acute intracranial abnormality. 2. Mild periventricular white matter disease. Electronically signed by: timothy berrigan 12/23/2023 06:00 PM EDT RP Workstation: [id number]   Medications:  potassium chloride  10 mEq (12/25/23 0908)    amLODipine   10 mg Oral Daily   buPROPion   300 mg Oral q AM   escitalopram   20 mg Oral q AM   feeding supplement  237 mL Oral BID BM   ferrous sulfate   325 mg Oral Q breakfast   hydrALAZINE   100 mg Oral TID   hydroxychloroquine   200 mg Oral BID   loratadine   10 mg Oral Daily   Assessment/Plan Herbert Morrison is an 78 y.o. 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     **AMS: CT neg.  Improved, in the setting of dehydration.     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  05/2023 UA bland, 12/23/23 UA 1+ protein, UP/C 0.11 - does not appear to have a GN and no serologies or biopsy is indicated.  12/2023 renal US  normal (simple cysts on R require no f/u).  For now would continue with IVF (currently bicarb gtt) and agree with GI consultation as it appears the main issue is the voluminous diarrhea.  After the colonoscopy, when diet is restored and GI has may any helpful changes to reduce diarrhea would stop the IVF and keep him in hospital 24h off fluids to ensure he can keep up po intake.   Would follow with daily labs.  Follow strict I/Os.    **diarrhea:  in the setting of near total colectomy - agree with involving GI while in.  Etiology suspected to be loss of colon length but GI ruling out infectious etiologies as contributing factor.    **Hypokalemia:  repleted this AM.    **NAGMA: in setting of diarrhea, bicarb 23 this AM.  Cont bicarb gtt for today but as above would stop IVF to ensure he can keep up.    **RA,  SLE:  as above no sign of assoc GN currently.  No active issues at this time.  On plaquenil .    **HTN:  currently normotensive, on home meds. Avoid RAASi in the setting of frequent hypovolemia.   **Anemia:  normocytic.  Hb in 10-11s currently.  10/2023 iron ok.  No indication for ESA.    Nothing further to add.  Pt had nephrology appt on Monday with [facility] in [place] - will bump that appt out to 2-4 weeks.  I've informed my office and the patient's wife of this change.  Office will reach out with new  date/time  Manuelita Barters MD 12/25/2023, 10:45 AM  [facility] Pager: [phone]

## 2023-12-25 NOTE — Interval H&P Note (Signed)
 History and Physical Interval Note:  12/25/2023 10:56 AM  Herbert Morrison  has presented today for surgery, with the diagnosis of history of ulcerative colitis with subtotal colectomy, chronic diarrhea.  The various methods of treatment have been discussed with the patient and family. After consideration of risks, benefits and other options for treatment, the patient has consented to  Procedure(s): SIGMOIDOSCOPY, FLEXIBLE (N/A) as a surgical intervention.  The patient's history has been reviewed, patient examined, no change in status, stable for surgery.  I have reviewed the patient's chart and labs.  Questions were answered to the patient's satisfaction.     Deatrice FALCON Abbagail Scaff

## 2023-12-25 NOTE — Brief Op Note (Signed)
 Patient underwent Flexible Sigmoidoscopy under propofol  sedation.  Tolerated the procedure adequately.   FINDINGS:  - One 5 mm polyp in the rectum, removed with a cold snare. Resected and retrieved.  - Patent end- to- side ileo- colonic anastomosis, characterized by healthy appearing mucosa. 20 cm of rectosigmoid stump ; healthy appearing without evidence of any active disease   - Non- bleeding internal hemorrhoids.   - Normal small bowel biopsied - Multiple biopsies were obtained from 1 to 20 cm proximal to the anus.   RECOMMENDATIONS  optimize Lomotil  and Imodium  : Lomotil  2 tabs four times daily , not to exceed 20mg  per day . imodium  4mg  ( two capsules) initially, then 2 mg after each subsquent loose stool not to exceed 16 mg per day .   Follow up biopsy results   Continue to follow up with primary gastroenterologist   Please recall GI with any questions  Shahidah Nesbitt Faizan Cadi Rhinehart, MD Gastroenterology and Hepatology [facility]

## 2023-12-25 NOTE — Evaluation (Signed)
 Occupational Therapy Evaluation Patient Details Name: Herbert Morrison MRN: [id number] DOB: 1946/04/06 Today's Date: 12/25/2023   History of Present Illness   Herbert Morrison is a 78 year old male with ulcerative colitis, hypertension, rheumatoid arthritis, stage IV CKD, chronic uncontrolled diarrhea managed with Lomotil  and Imodium , depression and anxiety on multiple medications, back pain presented to the ED by referral from PCP for symptoms of right-sided flank pain and dysuria since yesterday and having confusion at home with delirium.  Wife says he has been seeing people that are not present.  He forgot how to use the remote control.  He has been generally more weak and having difficulty getting out of bed in the last 24 hours.  He has also had some slurring speech and difficulty walking.  In the ED he was noted to be very dry with dry mucous membranes and his bladder showed a very minimal amount of urine.  He was given IV fluids.  His creatinine seems to be more elevated than normal with an elevated BUN level.  His ammonia level was within normal limits.  His CT head without contrast was within normal limits.  His urinalysis did not show evidence of infection.  Admission was requested for further management of acute metabolic encephalopathy secondary to acute on chronic renal failure.     Clinical Impressions Pt agreeable to OT and PT coe-valuation. Pt appears to be near baseline function operating at an independent level for ADL's and functional ambulation. Pt was left in the bed with call bell within reach and family present. Pt is not recommended for further acute OT services and will be discharged to care of nursing staff for remaining length of stay.              Functional Status Assessment   Patient has not had a recent decline in their functional status     Equipment Recommendations   None recommended by OT             Precautions/Restrictions    Precautions Precautions: Fall Recall of Precautions/Restrictions: Intact Restrictions Weight Bearing Restrictions Per Provider Order: No     Mobility Bed Mobility Overal bed mobility: Independent                  Transfers Overall transfer level: Independent                        Balance Overall balance assessment: Independent                                         ADL either performed or assessed with clinical judgement   ADL Overall ADL's : Independent                                             Vision Baseline Vision/History: 1 Wears glasses Ability to See in Adequate Light: 0 Adequate Patient Visual Report: No change from baseline Vision Assessment?: No apparent visual deficits     Perception Perception: Not tested       Praxis Praxis: Not tested       Pertinent Vitals/Pain Pain Assessment Pain Assessment: No/denies pain     Extremity/Trunk Assessment Upper Extremity Assessment Upper Extremity Assessment: Defer to OT evaluation   Lower  Extremity Assessment Lower Extremity Assessment: Overall WFL for tasks assessed   Cervical / Trunk Assessment Cervical / Trunk Assessment: Normal   Communication Communication Communication: No apparent difficulties   Cognition Arousal: Alert Behavior During Therapy: WFL for tasks assessed/performed Cognition: No apparent impairments                               Following commands: Intact       Cueing  General Comments   Cueing Techniques: Verbal cues                 Home Living Family/patient expects to be discharged to:: Private residence Living Arrangements: Spouse/significant other;Non-relatives/Friends Available Help at Discharge: Family;Available 24 hours/day Type of Home: House Home Access: Stairs to enter [employer] of Steps: 3 in one door 1 in another   Home Layout: Two level;Able to live on main level with  bedroom/bathroom     Bathroom Shower/Tub: Producer, television/film/video: Standard Bathroom Accessibility: Yes How Accessible: Accessible via wheelchair;Accessible via walker Home Equipment: Rollator (4 wheels);Cane - single point;Wheelchair - manual          Prior Functioning/Environment Prior Level of Function : Independent/Modified Independent             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 Comments: Independent                            Co-evaluation PT/OT/SLP Co-Evaluation/Treatment: Yes Reason for Co-Treatment: To address functional/ADL transfers PT goals addressed during session: Mobility/safety with mobility OT goals addressed during session: ADL's and self-care                       End of Session Equipment Utilized During Treatment: Gait belt  Activity Tolerance: Patient tolerated treatment well Patient left: in bed;with call bell/phone within reach  OT Visit Diagnosis: Unsteadiness on feet (R26.81);Other abnormalities of gait and mobility (R26.89);Other symptoms and signs involving cognitive function;History of falling (Z91.81)                Time: 9064-9052 OT Time Calculation (min): 12 min Charges:  OT General Charges $OT Visit: 1 Visit OT Evaluation $OT Eval Low Complexity: 1 Low  Shaindel Sweeten OT, MOT   Jayson Person 12/25/2023, 11:22 AM

## 2023-12-25 NOTE — Anesthesia Postprocedure Evaluation (Signed)
 Anesthesia Post Note  Patient: Herbert Morrison  Procedure(s) Performed: [place] SIDE  Patient location during evaluation: Phase II Anesthesia Type: General Level of consciousness: awake and alert Pain management: pain level controlled Vital Signs Assessment: post-procedure vital signs reviewed and stable Respiratory status: spontaneous breathing, nonlabored ventilation and respiratory function stable Cardiovascular status: blood pressure returned to baseline and stable Postop Assessment: no apparent nausea or vomiting Anesthetic complications: no   There were no known notable events for this encounter.   Last Vitals:  Vitals:   12/25/23 1130 12/25/23 1139  BP: 122/60   Pulse: (!) 52 (!) 53  Resp: 14 11  Temp:  36.7 C  SpO2: 99% 97%    Last Pain:  Vitals:   12/25/23 1130  TempSrc:   PainSc: 0-No pain                 Daviyon Widmayer L Ennifer Harston

## 2023-12-25 NOTE — Evaluation (Signed)
 Physical Therapy Evaluation Patient Details Name: Herbert Morrison MRN: [id number] DOB: Nov 19, 1945 Today's Date: 12/25/2023  History of Present Illness  Herbert Morrison is a 78 year old male with ulcerative colitis, hypertension, rheumatoid arthritis, stage IV CKD, chronic uncontrolled diarrhea managed with Lomotil  and Imodium , depression and anxiety on multiple medications, back pain presented to the ED by referral from PCP for symptoms of right-sided flank pain and dysuria since yesterday and having confusion at home with delirium.  Wife says he has been seeing people that are not present.  He forgot how to use the remote control.  He has been generally more weak and having difficulty getting out of bed in the last 24 hours.  He has also had some slurring speech and difficulty walking.  In the ED he was noted to be very dry with dry mucous membranes and his bladder showed a very minimal amount of urine.  He was given IV fluids.  His creatinine seems to be more elevated than normal with an elevated BUN level.  His ammonia level was within normal limits.  His CT head without contrast was within normal limits.  His urinalysis did not show evidence of infection.  Admission was requested for further management of acute metabolic encephalopathy secondary to acute on chronic renal failure.   Clinical Impression  Patient agreeable to and tolerated OT/PT co-evaluation well. On this date, patient is alert and oriented x4, and independent with all bed mobility, functional transfers (STS, toilet transfer), and mobility without AD. Patient demonstrates ability to manage IV pole in room and bathroom independently. Patient reports feeling at baseline for mobility this date. Patient does not present with urgent need for skilled physical therapy at this time. Patient discharged to care of nursing for ambulation daily as tolerated for length of stay.        If plan is discharge home, recommend the following:     Can  travel by private vehicle        Equipment Recommendations None recommended by PT  Recommendations for Other Services       Functional Status Assessment Patient has had a recent decline in their functional status and demonstrates the ability to make significant improvements in function in a reasonable and predictable amount of time.     Precautions / Restrictions Precautions Precautions: Fall Recall of Precautions/Restrictions: Intact Restrictions Weight Bearing Restrictions Per Provider Order: No      Mobility  Bed Mobility Overal bed mobility: Independent     General bed mobility comments: HOB flat, no use of railings    Transfers Overall transfer level: Independent Equipment used: None         General transfer comment: STS and toilet transfer with No AD, pt demo good ability to handle IV pole within restroom. No use of grab bars during toilet transfer    Ambulation/Gait Ambulation/Gait assistance: Supervision, Independent Gait Distance (Feet): 150 Feet Assistive device: None, IV Pole Gait Pattern/deviations: WFL(Within Functional Limits) Gait velocity: WNL     General Gait Details: Sup given throughout for safety, per clinical judgement, pt safe for independent ambulation. No overt LOB or unsteadiness. Pt ambulates majority without AD. Use of IV pole at end to assess whether pt could manage. Pt safe  throughout  Stairs            Wheelchair Mobility     Tilt Bed    Modified Rankin (Stroke Patients Only)       Balance Overall balance assessment: Independent  Pertinent Vitals/Pain Pain Assessment Pain Assessment: No/denies pain    Home Living Family/patient expects to be discharged to:: Private residence Living Arrangements: Spouse/significant other;Non-relatives/Friends Available Help at Discharge: Family;Available 24 hours/day Type of Home: House Home Access: Stairs to enter   [employer] of Steps: 3 in one door 1  in another   Home Layout: Two level;Able to live on main level with bedroom/bathroom Home Equipment: Rollator (4 wheels);Cane - single point;Wheelchair - manual      Prior Function Prior Level of Function : Independent/Modified Independent   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 Comments: Independent     Extremity/Trunk Assessment   Upper Extremity Assessment Upper Extremity Assessment: Defer to OT evaluation    Lower Extremity Assessment Lower Extremity Assessment: Overall WFL for tasks assessed    Cervical / Trunk Assessment Cervical / Trunk Assessment: Normal  Communication   Communication Communication: No apparent difficulties    Cognition Arousal: Alert Behavior During Therapy: WFL for tasks assessed/performed       Following commands: Intact       Cueing Cueing Techniques: Verbal cues     General Comments      Exercises     Assessment/Plan    PT Assessment Patient does not need any further PT services  PT Problem List         PT Treatment Interventions      PT Goals (Current goals can be found in the Care Plan section)  Acute Rehab PT Goals Patient Stated Goal: Return home PT Goal Formulation: With patient/family Time For Goal Achievement: 12/26/23 Potential to Achieve Goals: Good    Frequency       Co-evaluation PT/OT/SLP Co-Evaluation/Treatment: Yes Reason for Co-Treatment: To address functional/ADL transfers PT goals addressed during session: Mobility/safety with mobility OT goals addressed during session: ADL's and self-care       AM-PAC PT 6 Clicks Mobility  Outcome Measure Help needed turning from your back to your side while in a flat bed without using bedrails?: None Help needed moving from lying on your back to sitting on the side of a flat bed without using bedrails?: None Help needed moving to and from a bed to a chair (including a wheelchair)?: None Help needed standing up from a chair using your arms  (e.g., wheelchair or bedside chair)?: None Help needed to walk in hospital room?: None Help needed climbing 3-5 steps with a railing? : None 6 Click Score: 24    End of Session Equipment Utilized During Treatment: Gait belt Activity Tolerance: Patient tolerated treatment well Patient left: in bed;with family/visitor present   PT Visit Diagnosis: Other abnormalities of gait and mobility (R26.89)    Time: 9061-9052 PT Time Calculation (min) (ACUTE ONLY): 9 min   Charges:   PT Evaluation $PT Eval Low Complexity: 1 Low PT Treatments $Therapeutic Activity: 8-22 mins PT General Charges $$ ACUTE PT VISIT: 1 Visit         11:26 AM, 12/25/23 Rosaria Settler, PT, DPT [place] with [facility]

## 2023-12-25 NOTE — Progress Notes (Signed)
 Flex sig scheduled for today. Heparin  subcutaneous is on hold with last dose yesterday at 1400.   Hypokalemia with potassium 3.1. He has already received first dose of 10 mEq IV potassium with additional doses every hour to follow.   Cdiff and GI path negative.   Reviewed with wife and patient at bedside. Aware of plan.

## 2023-12-25 NOTE — Op Note (Signed)
 [facility] Patient Name: Herbert Morrison Procedure Date: 12/25/2023 10:44 AM MRN: [id number] Date of Birth: 05-12-1946 Attending MD: Deatrice Dine , MD, [id number] CSN: [id number] Age: 78 Admit Type: Outpatient Procedure:                Flexible Sigmoidoscopy Indications:              Chronic diarrhea, Personal history of ulcerative                            colitis Providers:                Deatrice Dine, MD, Harlene Lips, Rosina Jackolyn Jon Bettie Referring MD:              Medicines:                Monitored Anesthesia Care, None Complications:            No immediate complications. Estimated Blood Loss:     Estimated blood loss was minimal. Procedure:                Pre-Anesthesia Assessment:                           - Prior to the procedure, a History and Physical                            was performed, and patient medications and                            allergies were reviewed. The patient's tolerance of                            previous anesthesia was also reviewed. The risks                            and benefits of the procedure and the sedation                            options and risks were discussed with the patient.                            All questions were answered, and informed consent                            was obtained. Prior Anticoagulants: The patient has                            taken no anticoagulant or antiplatelet agents. ASA                            Grade Assessment: II - A patient with mild systemic  disease. After reviewing the risks and benefits,                            the patient was deemed in satisfactory condition to                            undergo the procedure.                           After obtaining informed consent, the scope was                            passed under direct vision. The [id number]-[id number]                            ([id number]) Peds Colon was  introduced through the                            anus and advanced to the the ileo-rectal                            anastomosis. The flexible sigmoidoscopy was                            accomplished without difficulty. The patient                            tolerated the procedure well. The quality of the                            bowel preparation was excellent. Scope In: 11:07:11 AM Scope Out: 11:18:03 AM Total Procedure Duration: 0 hours 10 minutes 52 seconds  Findings:      The perianal and digital rectal examinations were normal. Pertinent       negatives include normal sphincter tone.      A 5 mm polyp was found in the rectum. The polyp was sessile. The polyp       was removed with a cold snare. Resection and retrieval were complete.      There is no endoscopic evidence of bleeding, inflammation or ulcerations       from 0 to 20 cm Prox to Anus. Multiple biopsies were obtained from 1 to       20 cm proximal to the anus with cold forceps for ulcerative colitis       surveillance.      There was evidence of a prior end-to-side ileo-colonic anastomosis in       the recto-sigmoid colon. This was patent and was characterized by       healthy appearing mucosa. The anastomosis was traversed.      Non-bleeding internal hemorrhoids were found during retroflexion. The       hemorrhoids were small. Impression:               - One 5 mm polyp in the rectum, removed with a cold                            snare.  Resected and retrieved.                           - Patent end-to-side ileo-colonic anastomosis,                            characterized by healthy appearing mucosa. 20 cm of                            rectosigmoid stump ;healthy appearing without                            evidence of any active disease                           - Non-bleeding internal hemorrhoids.                           -Normal small bowel biopsied                           - Multiple biopsies were obtained  from 1 to 20 cm                            proximal to the anus. Moderate Sedation:      Per Anesthesia Care Recommendation:           optimize Lomotil  and Imodium  : Lomotil  2 tabs four                            times daily , not to exceed 20mg  per day . imodium                             4mg  ( two capsules) initially, then 2 mg after each                            subsquent loose stool not to exceed 16 mg per day .                           Follow up biopsy results                           Continue to follow up with primary                            gastroenterologist                           Consider annual Flexible Sigmoidoscopy Procedure Code(s):        --- Professional ---                           [phone], Sigmoidoscopy, flexible; with removal of                            tumor(s), polyp(s), or other lesion(s)  by snare                            technique                           45331, 59, Sigmoidoscopy, flexible; with biopsy,                            single or multiple Diagnosis Code(s):        --- Professional ---                           D12.8, Benign neoplasm of rectum                           Z98.0, Intestinal bypass and anastomosis status                           K64.8, Other hemorrhoids                           K52.9, Noninfective gastroenteritis and colitis,                            unspecified                           Z87.19, Personal history of other diseases of the                            digestive system CPT copyright 2022 American [employer] Association. All rights reserved. The codes documented in this report are preliminary and upon coder review may  be revised to meet current compliance requirements. Deatrice Dine, MD Deatrice Dine, MD 12/25/2023 11:34:38 AM This report has been signed electronically. Number of Addenda: 0

## 2023-12-25 NOTE — Anesthesia Preprocedure Evaluation (Addendum)
 Anesthesia Evaluation  Patient identified by MRN, date of birth, ID band Patient awake    Reviewed: Allergy & Precautions, H&P , NPO status , Patient's Chart, lab work & pertinent test results  Airway Mallampati: III  TM Distance: >3 FB Neck ROM: Full    Dental no notable dental hx. (+) Teeth Intact, Dental Advisory Given   Pulmonary shortness of breath and at rest, sleep apnea and Continuous Positive Airway Pressure Ventilation , former smoker   Pulmonary exam normal breath sounds clear to auscultation       Cardiovascular hypertension, Pt. on medications and Pt. on home beta blockers + CAD  Normal cardiovascular exam Rhythm:Regular Rate:Normal  Grade 1 diastolic dysfunction   Neuro/Psych   Anxiety Depression     Neuromuscular disease    GI/Hepatic Neg liver ROS, hiatal hernia, PUD,GERD  Medicated,,  Endo/Other    Class 3 obesity  Renal/GU CRF and ARFRenal diseaseStage 4 CKD  negative genitourinary   Musculoskeletal  (+) Arthritis , Rheumatoid disorders,    Abdominal   Peds  Hematology  (+) Blood dyscrasia, anemia   Anesthesia Other Findings Lupus  Cancer  Reproductive/Obstetrics negative OB ROS                              Anesthesia Physical Anesthesia Plan  ASA: 4  Anesthesia Plan: General   Post-op Pain Management: Minimal or no pain anticipated   Induction: Intravenous  PONV Risk Score and Plan: 1 and Propofol  infusion  Airway Management Planned: Natural Airway and Nasal Cannula  Additional Equipment: None  Intra-op Plan:   Post-operative Plan:   Informed Consent: I have reviewed the patients History and Physical, chart, labs and discussed the procedure including the risks, benefits and alternatives for the proposed anesthesia with the patient or authorized representative who has indicated his/her understanding and acceptance.     Dental advisory given  Plan  Discussed with: CRNA  Anesthesia Plan Comments:         Anesthesia Quick Evaluation

## 2023-12-25 NOTE — Progress Notes (Signed)
 PROGRESS NOTE   Herbert Morrison  FMW:[id number] DOB: 04/04/1946 DOA: 12/23/2023 PCP: Wendolyn Jenkins Jansky, MD   Chief Complaint  Patient presents with   Flank Pain   Altered Mental Status   Level of care: Med-Surg  Brief Admission History:  78 year old male with ulcerative colitis, hypertension, rheumatoid arthritis, stage IV CKD, chronic uncontrolled diarrhea managed with Lomotil  and Imodium , depression and anxiety on multiple medications, back pain presented to the ED by referral from PCP for symptoms of right-sided flank pain and dysuria since yesterday and having confusion at home with delirium.  Wife says he has been seeing people that are not present.  He forgot how to use the remote control.  He has been generally more weak and having difficulty getting out of bed in the last 24 hours.  He has also had some slurring speech and difficulty walking.  In the ED he was noted to be very dry with dry mucous membranes and his bladder showed a very minimal amount of urine.  He was given IV fluids.  His creatinine seems to be more elevated than normal with an elevated BUN level.  His ammonia level was within normal limits.  His CT head without contrast was within normal limits.  His urinalysis did not show evidence of infection.  Admission was requested for further management of acute metabolic encephalopathy secondary to acute on chronic renal failure.   Assessment and Plan:  AKI on CKD stage IV -- some prerenal component to this as he has chronic diarrhea and is clinically very dehydrated -- continue IV fluid hydration  -- daily renal function panel -- check renal US  -- right renal cysts noted, no hydronephrosis or obstruction seen   Metabolic Acidosis - resolved  -- sodium bicarbonate  IV fluids ordered and acidosis now resolved  -- recheck renal function panel in AM    Dehydration -- improving -- IV fluids ordered and now will stop IVF and let him eat/drink -- monitoring intake/output     Acute metabolic Encephalopathy - Improved -- stable neuro checks -- DC  -- CT head negative for acute findings -- treating AKI on CKD as above  -- if persists, would get MRI brain -- delirium precautions -- greatly reduce and / or eliminate sedating medications   Essential hypertension  -- resumed home BP lowering medications   Anemia in CKD -- resumed home daily iron supplementation    Depression/Anxiety  PTSD -- resumed home behavioral health medications    Rheumatoid Arthritis Lupus  -- resumed home plaquenil     Ulcerative Colitis Chronic uncontrolled Diarrhea -- Lomotil  ordered PRN -- likely is the reason he is so dehydrated -- will ask GI to assist with getting diarrhea control  -- Flex sig 8/15 with Dr. Cinderella -- wife reports most of colon has already been removed    Hyperlipidemia CAD -- check fasting lipid panel in AM    DVT prophylaxis: sq heparin   Code Status: Full  Family Communication: wife at bedside updated daily  Disposition: TBD    Consultants:  Nephrology GI  Procedures:   Antimicrobials:    Subjective: Pt says overall he is getting better.    Objective: Vitals:   12/25/23 1123 12/25/23 1130 12/25/23 1139 12/25/23 1200  BP: 110/63 122/60  (!) 151/75  Pulse: (!) 52 (!) 52 (!) 53 (!) 52  Resp: (!) 9 14 11 16   Temp: 97.9 F (36.6 C)  98.1 F (36.7 C) 97.6 F (36.4 C)  TempSrc:    Oral  SpO2: 97% 99% 97% 97%  Weight:      Height:        Intake/Output Summary (Last 24 hours) at 12/25/2023 1225 Last data filed at 12/25/2023 1120 Gross per 24 hour  Intake 2388.54 ml  Output --  Net 2388.54 ml   Filed Weights   12/23/23 1427 12/23/23 1849  Weight: 80.7 kg 80.5 kg   Examination:  General exam: Appears calm and comfortable  Respiratory system: Clear to auscultation. Respiratory effort normal. Cardiovascular system: normal S1 & S2 heard. No JVD, murmurs, rubs, gallops or clicks. No pedal edema. Gastrointestinal system: Abdomen is  nondistended, soft and nontender. No organomegaly or masses felt. Normal bowel sounds heard. Central nervous system: Alert and oriented. No focal neurological deficits. Extremities: Symmetric 5 x 5 power. Skin: No rashes, lesions or ulcers. Psychiatry: Judgement and insight appear normal. Mood & affect appropriate.   Data Reviewed: I have personally reviewed following labs and imaging studies  CBC: Recent Labs  Lab 12/23/23 1438 12/24/23 0432 12/25/23 0430  WBC 7.2 5.4 5.9  NEUTROABS 5.0  --   --   HGB 11.7* 10.4* 10.6*  HCT 37.8* 33.1* 32.8*  MCV 90.4 88.0 87.0  PLT 293 250 270    Basic Metabolic Panel: Recent Labs  Lab 12/23/23 1438 12/24/23 0432 12/24/23 0750 12/25/23 0430  NA 137 140  --  140  K 4.0 3.2*  --  3.1*  CL 113* 116*  --  111  CO2 16* 17*  --  23  GLUCOSE 75 94  --  99  BUN 48* 45*  --  31*  CREATININE 3.64* 3.36*  --  2.78*  CALCIUM  9.5 8.7*  --  8.8*  MG  --   --  2.3  --   PHOS  --  3.5  --  2.2*    CBG: Recent Labs  Lab 12/23/23 1534  GLUCAP 90    Recent Results (from the past 240 hours)  C Difficile Quick Screen w PCR reflex     Status: None   Collection Time: 12/24/23  3:37 PM   Specimen: STOOL  Result Value Ref Range Status   C Diff antigen NEGATIVE NEGATIVE Final   C Diff toxin NEGATIVE NEGATIVE Final   C Diff interpretation No C. difficile detected.  Final    Comment: Performed at [facility], [address], [place], [place] [address]  Gastrointestinal Panel by PCR , Stool     Status: None   Collection Time: 12/24/23  3:37 PM   Specimen: STOOL  Result Value Ref Range Status   Campylobacter species NOT DETECTED NOT DETECTED Final   Plesimonas shigelloides NOT DETECTED NOT DETECTED Final   Salmonella species NOT DETECTED NOT DETECTED Final   Yersinia enterocolitica NOT DETECTED NOT DETECTED Final   Vibrio species NOT DETECTED NOT DETECTED Final   Vibrio cholerae NOT DETECTED NOT DETECTED Final   Enteroaggregative E coli (EAEC)  NOT DETECTED NOT DETECTED Final   Enteropathogenic E coli (EPEC) NOT DETECTED NOT DETECTED Final   Enterotoxigenic E coli (ETEC) NOT DETECTED NOT DETECTED Final   Shiga like toxin producing E coli (STEC) NOT DETECTED NOT DETECTED Final   Shigella/Enteroinvasive E coli (EIEC) NOT DETECTED NOT DETECTED Final   Cryptosporidium NOT DETECTED NOT DETECTED Final   Cyclospora cayetanensis NOT DETECTED NOT DETECTED Final   Entamoeba histolytica NOT DETECTED NOT DETECTED Final   Giardia lamblia NOT DETECTED NOT DETECTED Final   Adenovirus F40/41 NOT DETECTED NOT DETECTED Final  Astrovirus NOT DETECTED NOT DETECTED Final   Norovirus GI/GII NOT DETECTED NOT DETECTED Final   Rotavirus A NOT DETECTED NOT DETECTED Final   Sapovirus (I, II, IV, and V) NOT DETECTED NOT DETECTED Final    Comment: Performed at [facility], [address] [place], [place] [address]     Radiology Studies: US  RENAL Result Date: 12/24/2023 CLINICAL DATA:  Acute kidney injury. EXAM: RENAL / URINARY TRACT ULTRASOUND COMPLETE COMPARISON:  May 15, 2023. FINDINGS: Right Kidney: Renal measurements: 12.0 x 5.5 x 5.6 cm = volume: 198 mL. At least 3 cysts are noted, the largest measuring 3.8 cm in the upper pole. Echogenicity within normal limits. No mass or hydronephrosis visualized. Left Kidney: Renal measurements: 11.5 x 5.3 x 5.3 cm = volume: 176 mL. Echogenicity within normal limits. No mass or hydronephrosis visualized. Bladder: Appears normal for degree of bladder distention. Calculated prevoid volume of 81 mL. Other: None. IMPRESSION: Right renal cysts are noted. No hydronephrosis or renal obstruction is noted. Electronically Signed   By: Lynwood Landy Raddle M.D.   On: 12/24/2023 10:55   CT Head Wo Contrast Result Date: 12/23/2023 EXAM: CT HEAD WITHOUT CONTRAST 12/23/2023 04:44:55 PM TECHNIQUE: CT of the head was performed without the administration of intravenous contrast. Automated exposure control, iterative  reconstruction, and/or weight based adjustment of the mA/kV was utilized to reduce the radiation dose to as low as reasonably achievable. COMPARISON: 04/26/2023 CLINICAL HISTORY: Mental status change, unknown cause. FINDINGS: BRAIN AND VENTRICLES: No acute hemorrhage. Gray-white differentiation is preserved. No hydrocephalus. No extra-axial collection. No mass effect or midline shift. Mild periventricular white matter disease. ORBITS: No acute abnormality. SINUSES: No acute abnormality. SOFT TISSUES AND SKULL: No acute soft tissue abnormality. No skull fracture. IMPRESSION: 1. No acute intracranial abnormality. 2. Mild periventricular white matter disease. Electronically signed by: timothy berrigan 12/23/2023 06:00 PM EDT RP Workstation: [id number]    Scheduled Meds:  amLODipine   10 mg Oral Daily   buPROPion   300 mg Oral q AM   escitalopram   20 mg Oral q AM   feeding supplement  237 mL Oral BID BM   ferrous sulfate   325 mg Oral Q breakfast   hydrALAZINE   100 mg Oral TID   hydroxychloroquine   200 mg Oral BID   loratadine   10 mg Oral Daily   Continuous Infusions:  potassium chloride  10 mEq (12/25/23 1203)    LOS: 1 day   Time spent: 56 mins  Lesly Pontarelli Vicci, MD How to contact the [facility] Attending or Consulting provider 7A - 7P or covering provider during after hours 7P -7A, for this patient?  Check the care team in [facility] and look for a) attending/consulting [facility] provider listed and b) the [facility] team listed Log into [email or web address] to find provider on call.  Locate the [facility] provider you are looking for under [facility] and page to a number that you can be directly reached. If you still have difficulty reaching the provider, please page the [facility] (Director on Call) for the Hospitalists listed on amion for assistance.  12/25/2023, 12:25 PM

## 2023-12-25 NOTE — Plan of Care (Signed)

## 2023-12-25 NOTE — Transfer of Care (Signed)
 Immediate Anesthesia Transfer of Care Note  Patient: Herbert Morrison  Procedure(s) Performed: [place] SIDE  Patient Location: PACU  Anesthesia Type:General  Level of Consciousness: awake, alert , oriented, and patient cooperative  Airway & Oxygen Therapy: Patient Spontanous Breathing  Post-op Assessment: Report given to RN, Post -op Vital signs reviewed and stable, and Patient moving all extremities X 4  Post vital signs: Reviewed and stable  Last Vitals:  Vitals Value Taken Time  BP 110/63 12/25/23 11:22  Temp 36.6 C 12/25/23 11:23  Pulse 52 12/25/23 11:23  Resp 10 12/25/23 11:23  SpO2 98 % 12/25/23 11:23  Vitals shown include unfiled device data.  Last Pain:  Vitals:   12/25/23 1101  TempSrc:   PainSc: 0-No pain         Complications: No notable events documented.

## 2023-12-25 NOTE — Care Management Important Message (Signed)
 Important Message  Patient Details  Name: Herbert Morrison MRN: [id number] Date of Birth: 05-27-1945   Important Message Given:  N/A - LOS <3 / Initial given by admissions     Duwaine LITTIE Ada 12/25/2023, 3:56 PM

## 2023-12-26 DIAGNOSIS — M329 Systemic lupus erythematosus, unspecified: Secondary | ICD-10-CM

## 2023-12-26 DIAGNOSIS — E860 Dehydration: Secondary | ICD-10-CM | POA: Diagnosis not present

## 2023-12-26 DIAGNOSIS — G9341 Metabolic encephalopathy: Secondary | ICD-10-CM | POA: Diagnosis not present

## 2023-12-26 DIAGNOSIS — N179 Acute kidney failure, unspecified: Secondary | ICD-10-CM | POA: Diagnosis not present

## 2023-12-26 DIAGNOSIS — K5120 Ulcerative (chronic) proctitis without complications: Secondary | ICD-10-CM

## 2023-12-26 LAB — RENAL FUNCTION PANEL
Albumin: 3.2 g/dL — ABNORMAL LOW (ref 3.5–5.0)
Anion gap: 7 (ref 5–15)
BUN: 24 mg/dL — ABNORMAL HIGH (ref 8–23)
CO2: 24 mmol/L (ref 22–32)
Calcium: 8.8 mg/dL — ABNORMAL LOW (ref 8.9–10.3)
Chloride: 109 mmol/L (ref 98–111)
Creatinine, Ser: 2.34 mg/dL — ABNORMAL HIGH (ref 0.61–1.24)
GFR, Estimated: 28 mL/min — ABNORMAL LOW (ref >60)
Glucose, Bld: 94 mg/dL (ref 70–99)
Phosphorus: 2.6 mg/dL (ref 2.5–4.6)
Potassium: 3.5 mmol/L (ref 3.5–5.1)
Sodium: 140 mmol/L (ref 135–145)

## 2023-12-26 LAB — CORTISOL-AM, BLOOD: Cortisol - AM: 11.2 ug/dL (ref 6.7–22.6)

## 2023-12-26 MED ORDER — KETOCONAZOLE 2 % EX SHAM: 1.0000 | MEDICATED_SHAMPOO | CUTANEOUS | 1 refills | Status: AC

## 2023-12-26 MED ORDER — DIPHENOXYLATE-ATROPINE 2.5-0.025 MG PO TABS: 2.0000 | ORAL_TABLET | Freq: Four times a day (QID) | ORAL | Status: DC

## 2023-12-26 MED ORDER — LOPERAMIDE HCL 2 MG PO CAPS: ORAL_CAPSULE | ORAL | 2 refills | Status: AC

## 2023-12-26 NOTE — Progress Notes (Signed)
 IV removed and DC instructions reviewed.  Has not had diarrhea today or last night.  Scripts sent to pharmacy and wife here to drive home

## 2023-12-26 NOTE — Discharge Summary (Signed)
 Physician Discharge Summary  Herbert Morrison FMW:[id number] DOB: [birth year] DOA: 12/23/2023  PCP: Wendolyn Jenkins Jansky, MD Renal: [facility] Admit date: 12/23/2023 Discharge date: 12/26/2023  Admitted From:  Home  Disposition:  Home   Recommendations for Outpatient Follow-up:  Follow up with PCP in 1 weeks Follow up with [facility] in 2 weeks  Please obtain BMP/CBC in 1-2 weeks  Home Health: NA  Discharge Condition: STABLE   CODE STATUS: FULL DIET: heart healthy foods   Brief Hospitalization Summary: Please see all hospital notes, images, labs for full details of the hospitalization. Admission provider HPI:  78 year old male with ulcerative colitis, hypertension, rheumatoid arthritis, stage IV CKD, chronic uncontrolled diarrhea managed with Lomotil  and Imodium , depression and anxiety on multiple medications, back pain presented to the ED by referral from PCP for symptoms of right-sided flank pain and dysuria since yesterday and having confusion at home with delirium.  Wife says he has been seeing people that are not present.  He forgot how to use the remote control.  He has been generally more weak and having difficulty getting out of bed in the last 24 hours.  He has also had some slurring speech and difficulty walking.  In the ED he was noted to be very dry with dry mucous membranes and his bladder showed a very minimal amount of urine.  He was given IV fluids.  His creatinine seems to be more elevated than normal with an elevated BUN level.  His ammonia level was within normal limits.  His CT head without contrast was within normal limits.  His urinalysis did not show evidence of infection.  Admission was requested for further management of acute metabolic encephalopathy secondary to acute on chronic renal failure.  Hospital Course by listed problems addressed  AKI on CKD stage IV -- some prerenal component to this as he has chronic diarrhea and is clinically very  dehydrated -- he responded well to IV fluid hydration  -- daily renal function panel -- check renal US  -- right renal cysts noted, no hydronephrosis or obstruction seen   Metabolic Acidosis - resolved  -- sodium bicarbonate  IV fluids ordered and acidosis now resolved    Hypokalemia -- treated and resolved   Dehydration -- improved -- IV fluids completed and he is eating and drinking well    Acute metabolic Encephalopathy - Improved -- stable neuro checks -- DC  -- CT head negative for acute findings -- treating AKI on CKD as above  -- if persists, would get MRI brain -- delirium precautions -- greatly reduce and / or eliminate sedating medications   Essential hypertension  -- resumed home BP lowering medications   Anemia in CKD -- resumed home daily iron supplementation    Depression/Anxiety  PTSD -- resumed home behavioral health medications    Rheumatoid Arthritis Lupus  -- resumed home plaquenil     Ulcerative Colitis Chronic uncontrolled Diarrhea -- Lomotil  QID and imodium  after each loose stool per GI recs  -- likely is the reason he is so dehydrated -- consulted GI to assist with getting diarrhea control  -- Flex sig 8/15 with Dr. 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 [facility])   Discharge Instructions:  Allergies as of 12/26/2023       Reactions   Other Anaphylaxis, Other (See  Comments)   Rumicade   Remicade [infliximab] Anaphylaxis   Sulfa Antibiotics Anaphylaxis, Diarrhea, Nausea And Vomiting   Erythromycin Hives, Nausea And Vomiting   Mesalamine  Other (See Comments)   Unknown    Pollen Extract Other (See Comments)   Unknown    Sulfasalazine Nausea And Vomiting   Grass Pollen(k-o-r-t-swt Vern) Other (See Comments)   Seasonal allergies        Medication List     STOP taking these medications    FIBER CHOICE PO   metoCLOPramide  5 MG tablet Commonly known as: Reglan        TAKE these medications    acetaminophen  500 MG tablet Commonly known as: TYLENOL  Take 1,000 mg by mouth every 6 (six) hours as needed for mild pain (pain score 1-3).   amLODipine  10 MG tablet Commonly known as: NORVASC  Take 1 tablet every day by oral route.   buPROPion  300 MG 24 hr tablet Commonly known as: WELLBUTRIN  XL Take 1 tablet (300 mg total) by mouth in the morning.   Cyanocobalamin 3000 MCG Caps Take 1 capsule by mouth daily.   diphenoxylate -atropine  2.5-0.025 MG tablet Commonly known as: Lomotil  Take 2 tablets by mouth 4 (four) times daily. What changed:  how much to take when to take this reasons to take this   escitalopram  20 MG tablet Commonly known as: LEXAPRO  Take 20 mg by mouth in the morning.   ferrous sulfate  325 (65 FE) MG tablet Take 1 tablet every day by oral route.   fluticasone  50 MCG/ACT nasal spray Commonly known as: FLONASE  Place 2 sprays into both nostrils as needed for allergies or rhinitis.   hydrALAZINE  100 MG tablet Commonly known as: APRESOLINE  Take 100 mg by mouth 3 (three) times daily.   HYDROcodone -acetaminophen  10-325 MG tablet Commonly known as: NORCO Take 1 tablet by mouth every 6 (six) hours as needed for moderate pain (pain score 4-6).   hydroxychloroquine  200 MG tablet Commonly known as: PLAQUENIL  Take 200 mg by mouth 2 (two) times daily.   ketoconazole  2 % shampoo Commonly known as: NIZORAL  Apply 1  Application topically every other day. Every other shower   levocetirizine 5 MG tablet Commonly known as: XYZAL Take 10 mg by mouth daily.   loperamide  2 MG capsule Commonly known as: IMODIUM  two capsules initially, then 2 mg after each subsquent loose stool not to exceed 16 mg per day   LORazepam  2 MG tablet Commonly known as: ATIVAN  Take 2 mg by mouth in the morning and at bedtime.   meclizine  25 MG tablet Commonly known as: ANTIVERT  Take 25 mg by mouth 3 (three) times daily as needed (vertigo).   methocarbamol  500 MG tablet Commonly known as: ROBAXIN  Take 1 tablet (500 mg total) by mouth every 8 (eight) hours as needed for muscle spasms.   ondansetron  4 MG tablet Commonly known as: ZOFRAN  Take 4 mg by mouth every 8 (eight) hours as needed for vomiting or nausea.   PROBIOTIC ADVANCED PO Take 1 capsule by mouth daily.   SIMETHICONE  PO Take 1 tablet by mouth in the morning, at noon, and  at bedtime.        Follow-up Information     Wendolyn Jenkins Jansky, MD. Schedule an appointment as soon as possible for a visit in 1 week(s).   Specialty: Family Medicine Why: Hospital Follow Up Contact information: [address] [place] [address] [phone]         [facility]. Schedule an appointment as soon as possible for a visit in 2 week(s).   Why: Hospital Follow Up               Allergies  Allergen Reactions   Other Anaphylaxis and Other (See Comments)    Rumicade   Remicade [Infliximab] Anaphylaxis   Sulfa Antibiotics Anaphylaxis, Diarrhea and Nausea And Vomiting   Erythromycin Hives and Nausea And Vomiting   Mesalamine  Other (See Comments)    Unknown    Pollen Extract Other (See Comments)    Unknown    Sulfasalazine Nausea And Vomiting   Grass Pollen(K-O-R-T-Swt Vern) Other (See Comments)    Seasonal allergies   Allergies as of 12/26/2023       Reactions   Other Anaphylaxis, Other (See Comments)   Rumicade   Remicade  [infliximab] Anaphylaxis   Sulfa Antibiotics Anaphylaxis, Diarrhea, Nausea And Vomiting   Erythromycin Hives, Nausea And Vomiting   Mesalamine  Other (See Comments)   Unknown    Pollen Extract Other (See Comments)   Unknown    Sulfasalazine Nausea And Vomiting   Grass Pollen(k-o-r-t-swt Vern) Other (See Comments)   Seasonal allergies        Medication List     STOP taking these medications    FIBER CHOICE PO   metoCLOPramide  5 MG tablet Commonly known as: Reglan        TAKE these medications    acetaminophen  500 MG tablet Commonly known as: TYLENOL  Take 1,000 mg by mouth every 6 (six) hours as needed for mild pain (pain score 1-3).   amLODipine  10 MG tablet Commonly known as: NORVASC  Take 1 tablet every day by oral route.   buPROPion  300 MG 24 hr tablet Commonly known as: WELLBUTRIN  XL Take 1 tablet (300 mg total) by mouth in the morning.   Cyanocobalamin 3000 MCG Caps Take 1 capsule by mouth daily.   diphenoxylate -atropine  2.5-0.025 MG tablet Commonly known as: Lomotil  Take 2 tablets by mouth 4 (four) times daily. What changed:  how much to take when to take this reasons to take this   escitalopram  20 MG tablet Commonly known as: LEXAPRO  Take 20 mg by mouth in the morning.   ferrous sulfate  325 (65 FE) MG tablet Take 1 tablet every day by oral route.   fluticasone  50 MCG/ACT nasal spray Commonly known as: FLONASE  Place 2 sprays into both nostrils as needed for allergies or rhinitis.   hydrALAZINE  100 MG tablet Commonly known as: APRESOLINE  Take 100 mg by mouth 3 (three) times daily.   HYDROcodone -acetaminophen  10-325 MG tablet Commonly known as: NORCO Take 1 tablet by mouth every 6 (six) hours as needed for moderate pain (pain score 4-6).   hydroxychloroquine  200 MG tablet Commonly known as: PLAQUENIL  Take 200 mg by mouth 2 (two) times daily.   ketoconazole  2 % shampoo Commonly known as: NIZORAL  Apply 1 Application topically every other day.  Every other shower   levocetirizine 5 MG tablet Commonly known as: XYZAL Take 10 mg by mouth daily.   loperamide  2 MG capsule Commonly known as: IMODIUM  two capsules initially, then 2 mg after each subsquent loose stool not  to exceed 16 mg per day   LORazepam  2 MG tablet Commonly known as: ATIVAN  Take 2 mg by mouth in the morning and at bedtime.   meclizine  25 MG tablet Commonly known as: ANTIVERT  Take 25 mg by mouth 3 (three) times daily as needed (vertigo).   methocarbamol  500 MG tablet Commonly known as: ROBAXIN  Take 1 tablet (500 mg total) by mouth every 8 (eight) hours as needed for muscle spasms.   ondansetron  4 MG tablet Commonly known as: ZOFRAN  Take 4 mg by mouth every 8 (eight) hours as needed for vomiting or nausea.   PROBIOTIC ADVANCED PO Take 1 capsule by mouth daily.   SIMETHICONE  PO Take 1 tablet by mouth in the morning, at noon, and at bedtime.        Procedures/Studies: US  RENAL Result Date: 12/24/2023 CLINICAL DATA:  Acute kidney injury. EXAM: RENAL / URINARY TRACT ULTRASOUND COMPLETE COMPARISON:  May 15, 2023. FINDINGS: Right Kidney: Renal measurements: 12.0 x 5.5 x 5.6 cm = volume: 198 mL. At least 3 cysts are noted, the largest measuring 3.8 cm in the upper pole. Echogenicity within normal limits. No mass or hydronephrosis visualized. Left Kidney: Renal measurements: 11.5 x 5.3 x 5.3 cm = volume: 176 mL. Echogenicity within normal limits. No mass or hydronephrosis visualized. Bladder: Appears normal for degree of bladder distention. Calculated prevoid volume of 81 mL. Other: None. IMPRESSION: Right renal cysts are noted. No hydronephrosis or renal obstruction is noted. Electronically Signed   By: Lynwood Landy Raddle M.D.   On: 12/24/2023 10:55   CT Head Wo Contrast Result Date: 12/23/2023 EXAM: CT HEAD WITHOUT CONTRAST 12/23/2023 04:44:55 PM TECHNIQUE: CT of the head was performed without the administration of intravenous contrast. Automated exposure  control, iterative reconstruction, and/or weight based adjustment of the mA/kV was utilized to reduce the radiation dose to as low as reasonably achievable. COMPARISON: 04/26/2023 CLINICAL HISTORY: Mental status change, unknown cause. FINDINGS: BRAIN AND VENTRICLES: No acute hemorrhage. Gray-white differentiation is preserved. No hydrocephalus. No extra-axial collection. No mass effect or midline shift. Mild periventricular white matter disease. ORBITS: No acute abnormality. SINUSES: No acute abnormality. SOFT TISSUES AND SKULL: No acute soft tissue abnormality. No skull fracture. IMPRESSION: 1. No acute intracranial abnormality. 2. Mild periventricular white matter disease. Electronically signed by: evalene coho 12/23/2023 06:00 PM EDT RP Workstation: [id number]     Subjective: Pt reports he is feeling well and had no diarrhea overnight.  He has been eating and drinking well.   Discharge Exam: Vitals:   12/25/23 2313 12/26/23 0500  BP:  (!) 160/80  Pulse:  61  Resp:  14  Temp: 99.1 F (37.3 C) 98.7 F (37.1 C)  SpO2:  95%   Vitals:   12/25/23 1611 12/25/23 1937 12/25/23 2313 12/26/23 0500  BP: (!) 146/73 (!) 152/80  (!) 160/80  Pulse:  81  61  Resp:  12  14  Temp:  99.4 F (37.4 C) 99.1 F (37.3 C) 98.7 F (37.1 C)  TempSrc:  Oral Oral Oral  SpO2:  93%  95%  Weight:      Height:       General: Pt is alert, awake, not in acute distress Cardiovascular: RRR, S1/S2 +, no rubs, no gallops Respiratory: CTA bilaterally, no wheezing, no rhonchi Abdominal: Soft, NT, ND, bowel sounds + Extremities: no edema, no cyanosis   The results of significant diagnostics from this hospitalization (including imaging, microbiology, ancillary and laboratory) are listed below for reference.  Microbiology: Recent Results (from the past 240 hours)  C Difficile Quick Screen w PCR reflex     Status: None   Collection Time: 12/24/23  3:37 PM   Specimen: STOOL  Result Value Ref Range Status    C Diff antigen NEGATIVE NEGATIVE Final   C Diff toxin NEGATIVE NEGATIVE Final   C Diff interpretation No C. difficile detected.  Final    Comment: Performed at [facility], [address] [place], [place] [address]  Gastrointestinal Panel by PCR , Stool     Status: None   Collection Time: 12/24/23  3:37 PM   Specimen: STOOL  Result Value Ref Range Status   Campylobacter species NOT DETECTED NOT DETECTED Final   Plesimonas shigelloides NOT DETECTED NOT DETECTED Final   Salmonella species NOT DETECTED NOT DETECTED Final   Yersinia enterocolitica NOT DETECTED NOT DETECTED Final   Vibrio species NOT DETECTED NOT DETECTED Final   Vibrio cholerae NOT DETECTED NOT DETECTED Final   Enteroaggregative E coli (EAEC) NOT DETECTED NOT DETECTED Final   Enteropathogenic E coli (EPEC) NOT DETECTED NOT DETECTED Final   Enterotoxigenic E coli (ETEC) NOT DETECTED NOT DETECTED Final   Shiga like toxin producing E coli (STEC) NOT DETECTED NOT DETECTED Final   Shigella/Enteroinvasive E coli (EIEC) NOT DETECTED NOT DETECTED Final   Cryptosporidium NOT DETECTED NOT DETECTED Final   Cyclospora cayetanensis NOT DETECTED NOT DETECTED Final   Entamoeba histolytica NOT DETECTED NOT DETECTED Final   Giardia lamblia NOT DETECTED NOT DETECTED Final   Adenovirus F40/41 NOT DETECTED NOT DETECTED Final   Astrovirus NOT DETECTED NOT DETECTED Final   Norovirus GI/GII NOT DETECTED NOT DETECTED Final   Rotavirus A NOT DETECTED NOT DETECTED Final   Sapovirus (I, II, IV, and V) NOT DETECTED NOT DETECTED Final    Comment: Performed at [facility], [address] [place], [place] [address]     Labs: BNP (last 3 results) No results for input(s): BNP in the last 8760 hours. Basic Metabolic Panel: Recent Labs  Lab 12/23/23 1438 12/24/23 0432 12/24/23 0750 12/25/23 0430 12/26/23 0452  NA 137 140  --  140 140  K 4.0 3.2*  --  3.1* 3.5  CL 113* 116*  --  111 109  CO2 16* 17*  --  23 24  GLUCOSE 75 94  --   99 94  BUN 48* 45*  --  31* 24*  CREATININE 3.64* 3.36*  --  2.78* 2.34*  CALCIUM  9.5 8.7*  --  8.8* 8.8*  MG  --   --  2.3  --   --   PHOS  --  3.5  --  2.2* 2.6   Liver Function Tests: Recent Labs  Lab 12/23/23 1438 12/24/23 0432 12/25/23 0430 12/26/23 0452  AST 13*  --   --   --   ALT 10  --   --   --   ALKPHOS 49  --   --   --   BILITOT 0.6  --   --   --   PROT 6.8  --   --   --   ALBUMIN  4.0 3.4* 3.2* 3.2*   Recent Labs  Lab 12/23/23 1438  LIPASE 56*   Recent Labs  Lab 12/23/23 1703  AMMONIA 30   CBC: Recent Labs  Lab 12/23/23 1438 12/24/23 0432 12/25/23 0430  WBC 7.2 5.4 5.9  NEUTROABS 5.0  --   --   HGB 11.7* 10.4* 10.6*  HCT 37.8*  33.1* 32.8*  MCV 90.4 88.0 87.0  PLT 293 250 270   Cardiac Enzymes: No results for input(s): CKTOTAL, CKMB, CKMBINDEX, TROPONINI in the last 168 hours. BNP: Invalid input(s): POCBNP CBG: Recent Labs  Lab 12/23/23 1534  GLUCAP 90   D-Dimer No results for input(s): DDIMER in the last 72 hours. Hgb A1c Recent Labs    12/23/23 1438  HGBA1C 5.1   Lipid Profile Recent Labs    12/24/23 0432  CHOL 109  HDL 41  LDLCALC 54  TRIG 71  CHOLHDL 2.7   Thyroid  function studies No results for input(s): TSH, T4TOTAL, T3FREE, THYROIDAB in the last 72 hours.  Invalid input(s): FREET3 Anemia work up No results for input(s): VITAMINB12, FOLATE, FERRITIN, TIBC, IRON, RETICCTPCT in the last 72 hours. Urinalysis    Component Value Date/Time   COLORURINE YELLOW 12/23/2023 1437   APPEARANCEUR CLEAR 12/23/2023 1437   LABSPEC 1.013 12/23/2023 1437   PHURINE 5.0 12/23/2023 1437   GLUCOSEU NEGATIVE 12/23/2023 1437   HGBUR NEGATIVE 12/23/2023 1437   BILIRUBINUR NEGATIVE 12/23/2023 1437   KETONESUR NEGATIVE 12/23/2023 1437   PROTEINUR 30 (A) 12/23/2023 1437   NITRITE NEGATIVE 12/23/2023 1437   LEUKOCYTESUR NEGATIVE 12/23/2023 1437   Sepsis Labs Recent Labs  Lab 12/23/23 1438  12/24/23 0432 12/25/23 0430  WBC 7.2 5.4 5.9   Microbiology Recent Results (from the past 240 hours)  C Difficile Quick Screen w PCR reflex     Status: None   Collection Time: 12/24/23  3:37 PM   Specimen: STOOL  Result Value Ref Range Status   C Diff antigen NEGATIVE NEGATIVE Final   C Diff toxin NEGATIVE NEGATIVE Final   C Diff interpretation No C. difficile detected.  Final    Comment: Performed at [facility], [address] [place], [place] [address]  Gastrointestinal Panel by PCR , Stool     Status: None   Collection Time: 12/24/23  3:37 PM   Specimen: STOOL  Result Value Ref Range Status   Campylobacter species NOT DETECTED NOT DETECTED Final   Plesimonas shigelloides NOT DETECTED NOT DETECTED Final   Salmonella species NOT DETECTED NOT DETECTED Final   Yersinia enterocolitica NOT DETECTED NOT DETECTED Final   Vibrio species NOT DETECTED NOT DETECTED Final   Vibrio cholerae NOT DETECTED NOT DETECTED Final   Enteroaggregative E coli (EAEC) NOT DETECTED NOT DETECTED Final   Enteropathogenic E coli (EPEC) NOT DETECTED NOT DETECTED Final   Enterotoxigenic E coli (ETEC) NOT DETECTED NOT DETECTED Final   Shiga like toxin producing E coli (STEC) NOT DETECTED NOT DETECTED Final   Shigella/Enteroinvasive E coli (EIEC) NOT DETECTED NOT DETECTED Final   Cryptosporidium NOT DETECTED NOT DETECTED Final   Cyclospora cayetanensis NOT DETECTED NOT DETECTED Final   Entamoeba histolytica NOT DETECTED NOT DETECTED Final   Giardia lamblia NOT DETECTED NOT DETECTED Final   Adenovirus F40/41 NOT DETECTED NOT DETECTED Final   Astrovirus NOT DETECTED NOT DETECTED Final   Norovirus GI/GII NOT DETECTED NOT DETECTED Final   Rotavirus A NOT DETECTED NOT DETECTED Final   Sapovirus (I, II, IV, and V) NOT DETECTED NOT DETECTED Final    Comment: Performed at [facility], [address] [place], [place] [address]   Time coordinating discharge:  44 mins   SIGNED:  Afton Louder,  MD  [facility] 12/26/2023, 10:03 AM How to contact the [facility] Attending or Consulting provider 7A - 7P or covering provider during after hours 7P -7A, for this patient?  Check the care team in [facility] and look for a) attending/consulting [facility] provider listed and b) the [facility] team listed Log into [email or web address] and use [place]'s universal password to access. If you do not have the password, please contact the hospital operator. Locate the [facility] provider you are looking for under [facility] and page to a number that you can be directly reached. If you still have difficulty reaching the provider, please page the [facility] (Director on Call) for the Hospitalists listed on amion for assistance.

## 2023-12-26 NOTE — Discharge Instructions (Signed)
 IMPORTANT INFORMATION: PAY CLOSE ATTENTION   PHYSICIAN DISCHARGE INSTRUCTIONS  Follow with Primary care provider  Wendolyn Jenkins Jansky, MD  and other consultants as instructed by your Hospitalist Physician  SEEK MEDICAL CARE OR RETURN TO EMERGENCY ROOM IF SYMPTOMS COME BACK, WORSEN OR NEW PROBLEM DEVELOPS   Please note: You were cared for by a hospitalist during your hospital stay. Every effort will be made to forward records to your primary care provider.  You can request that your primary care provider send for your hospital records if they have not received them.  Once you are discharged, your primary care physician will handle any further medical issues. Please note that NO REFILLS for any discharge medications will be authorized once you are discharged, as it is imperative that you return to your primary care physician (or establish a relationship with a primary care physician if you do not have one) for your post hospital discharge needs so that they can reassess your need for medications and monitor your lab values.  Please get a complete blood count and chemistry panel checked by your Primary MD at your next visit, and again as instructed by your Primary MD.  Get Medicines reviewed and adjusted: Please take all your medications with you for your next visit with your Primary MD  Laboratory/radiological data: Please request your Primary MD to go over all hospital tests and procedure/radiological results at the follow up, please ask your primary care provider to get all Hospital records sent to his/her office.  In some cases, they will be blood work, cultures and biopsy results pending at the time of your discharge. Please request that your primary care provider follow up on these results.  If you are diabetic, please bring your blood sugar readings with you to your follow up appointment with primary care.    Please call and make your follow up appointments as soon as possible.    Also Note  the following: If you experience worsening of your admission symptoms, develop shortness of breath, life threatening emergency, suicidal or homicidal thoughts you must seek medical attention immediately by calling 911 or calling your MD immediately  if symptoms less severe.  You must read complete instructions/literature along with all the possible adverse reactions/side effects for all the Medicines you take and that have been prescribed to you. Take any new Medicines after you have completely understood and accpet all the possible adverse reactions/side effects.   Do not drive when taking Pain medications or sleeping medications (Benzodiazepines)  Do not take more than prescribed Pain, Sleep and Anxiety Medications. It is not advisable to combine anxiety,sleep and pain medications without talking with your primary care practitioner  Special Instructions: If you have smoked or chewed Tobacco  in the last 2 yrs please stop smoking, stop any regular Alcohol  and or any Recreational drug use.  Wear Seat belts while driving.  Do not drive if taking any narcotic, mind altering or controlled substances or recreational drugs or alcohol.

## 2023-12-27 LAB — PTH, INTACT AND CALCIUM
Calcium, Total (PTH): 9.1 mg/dL (ref 8.6–10.2)
PTH: 24 pg/mL (ref 15–65)

## 2023-12-28 ENCOUNTER — Telehealth: Payer: Self-pay

## 2023-12-28 NOTE — Transitions of Care (Post Inpatient/ED Visit) (Signed)
 12/28/2023  Name: Herbert Morrison MRN: [id number] DOB: Sep 24, 1945  Today's TOC FU Call Status: Today's TOC FU Call Status:: Successful TOC FU Call Completed TOC FU Call Complete Date: 12/28/23 Patient's Name and Date of Birth confirmed.  Transition Care Management Follow-up Telephone Call Date of Discharge: 12/26/23 Dis[facility] Type of Discharge: Inpatient Admission Primary Inpatient Discharge Diagnosis:: Acute Kidney Injury How have you been since you were released from the hospital?: Better Any questions or concerns?: No  Items Reviewed: Did you receive and understand the discharge instructions provided?: Yes Medications obtained,verified, and reconciled?: Yes (Medications Reviewed) Any new allergies since your discharge?: No Dietary orders reviewed?: Yes Type of Diet Ordered:: Heart Healthy Do you have support at home?: Yes People in Home [RPT]: spouse  Medications Reviewed Today: Medications Reviewed Today     Reviewed by Lavelle Charmaine NOVAK, LPN (Licensed Practical Nurse) on 12/28/23 at 1045  Med List Status: <None>   Medication Order Taking? Sig Documenting Provider Last Dose Status Informant  acetaminophen  (TYLENOL ) 500 MG tablet [id number] Yes Take 1,000 mg by mouth every 6 (six) hours as needed for mild pain (pain score 1-3). [provider]  Active Spouse/Significant Other, Pharmacy Records, Self  amLODipine  (NORVASC ) 10 MG tablet [id number] Yes Take 1 tablet every day by oral route. [provider]  Active Spouse/Significant Other, Pharmacy Records, Self  buPROPion  (WELLBUTRIN  XL) 300 MG 24 hr tablet [id number] Yes Take 1 tablet (300 mg total) by mouth in the morning. Wendolyn Jenkins Jansky, MD  Active Spouse/Significant Other, Pharmacy Records, Self  Cyanocobalamin 3000 MCG CAPS [id number] Yes Take 1 capsule by mouth daily. [provider]  Active Spouse/Significant Other, Pharmacy Records, Self  diphenoxylate -atropine  (LOMOTIL )  2.5-0.025 MG tablet [id number] Yes Take 2 tablets by mouth 4 (four) times daily. Johnson, Clanford L, MD  Active   escitalopram  (LEXAPRO ) 20 MG tablet [id number] Yes Take 20 mg by mouth in the morning. [provider]  Active Spouse/Significant Other, Pharmacy Records, Self  ferrous sulfate  325 (65 FE) MG tablet [id number] Yes Take 1 tablet every day by oral route. [provider]  Active Spouse/Significant Other, Pharmacy Records, Self  fluticasone  (FLONASE ) 50 MCG/ACT nasal spray [id number] Yes Place 2 sprays into both nostrils as needed for allergies or rhinitis. [provider]  Active Spouse/Significant Other, Pharmacy Records, Self           Med Note (WARD, CHUCK MATSU   Wed Dec 23, 2023  5:00 PM) Not taken within past 30 days, med is PRN  hydrALAZINE  (APRESOLINE ) 100 MG tablet [id number] Yes Take 100 mg by mouth 3 (three) times daily. [provider]  Active Spouse/Significant Other, Pharmacy Records, Self  HYDROcodone -acetaminophen  (NORCO) 10-325 MG tablet [id number] Yes Take 1 tablet by mouth every 6 (six) hours as needed for moderate pain (pain score 4-6). [provider]  Active Spouse/Significant Other, Pharmacy Records, Self  hydroxychloroquine  (PLAQUENIL ) 200 MG tablet [id number] Yes Take 200 mg by mouth 2 (two) times daily. [provider]  Active Spouse/Significant Other, Pharmacy Records, Self  ketoconazole  (NIZORAL ) 2 % shampoo [id number] Yes Apply 1 Application topically every other day. Every other shower Johnson, Clanford L, MD  Active   levocetirizine (XYZAL) 5 MG tablet [id number] Yes Take 10 mg by mouth daily. [provider]  Active Spouse/Significant Other, Pharmacy Records, Self  loperamide  (IMODIUM ) 2 MG capsule [id number] Yes two capsules initially, then 2 mg after each subsquent loose stool not to exceed 16 mg per day Vicci,  Clanford L, MD  Active   LORazepam  (ATIVAN ) 2 MG tablet [id number] Yes Take 2 mg by mouth in the  morning and at bedtime. [provider]  Active Spouse/Significant Other, Pharmacy Records, Self  meclizine  (ANTIVERT ) 25 MG tablet [id number] Yes Take 25 mg by mouth 3 (three) times daily as needed (vertigo). [provider]  Active Spouse/Significant Other, Pharmacy Records, Self           Med Note (WARD, CHUCK MATSU   Wed Dec 23, 2023  5:01 PM) Not taken within past 30 days, med is PRN   methocarbamol  (ROBAXIN ) 500 MG tablet [id number] Yes Take 1 tablet (500 mg total) by mouth every 8 (eight) hours as needed for muscle spasms. Wendolyn Jenkins Jansky, MD  Active Spouse/Significant Other, Pharmacy Records, Self  ondansetron  (ZOFRAN ) 4 MG tablet [id number] Yes Take 4 mg by mouth every 8 (eight) hours as needed for vomiting or nausea. [provider]  Active Spouse/Significant Other, Pharmacy Records, Self           Med Note (WARD, CHUCK MATSU   Wed Dec 23, 2023  5:00 PM) Not taken within past 30 days, med is PRN   Probiotic Product (PROBIOTIC ADVANCED PO) [id number] Yes Take 1 capsule by mouth daily. [provider]  Active Spouse/Significant Other, Pharmacy Records, Self  SIMETHICONE  PO [id number] Yes Take 1 tablet by mouth in the morning, at noon, and at bedtime. [provider]  Active Spouse/Significant Other, Pharmacy Records, Self            Home Care and Equipment/Supplies: Were Home Health Services Ordered?: NA Any new equipment or medical supplies ordered?: NA  Functional Questionnaire: Do you need assistance with bathing/showering or dressing?: No Do you need assistance with meal preparation?: No Do you need assistance with eating?: No Do you have difficulty maintaining continence: No Do you need assistance with getting out of bed/getting out of a chair/moving?: No Do you have difficulty managing or taking your medications?: No  Follow up appointments reviewed: PCP Follow-up appointment confirmed?: Yes Date of PCP follow-up appointment?:  12/31/23 Follow-up Provider: Dr. Wendolyn Specialist [facility] Follow-up appointment confirmed?: No Reason Specialist Follow-Up Not Confirmed: Patient has Specialist Provider Number and will Call for Appointment Do you need transportation to your follow-up appointment?: No Do you understand care options if your condition(s) worsen?: Yes-patient verbalized understanding    SIGNATURE Charmaine Bloodgood, LPN [facility] Health Advisor [facility] You Are. We Are. [facility] Direct Dial [phone]

## 2023-12-29 ENCOUNTER — Encounter (HOSPITAL_COMMUNITY): Payer: Self-pay | Admitting: Gastroenterology

## 2023-12-29 LAB — SURGICAL PATHOLOGY

## 2023-12-31 ENCOUNTER — Ambulatory Visit: Payer: Self-pay | Admitting: Family Medicine

## 2023-12-31 ENCOUNTER — Ambulatory Visit (INDEPENDENT_AMBULATORY_CARE_PROVIDER_SITE_OTHER): Admitting: Family Medicine

## 2023-12-31 ENCOUNTER — Encounter: Payer: Self-pay | Admitting: Family Medicine

## 2023-12-31 VITALS — BP 122/60 | HR 49 | Temp 97.3°F | Ht 65.0 in | Wt 183.4 lb

## 2023-12-31 DIAGNOSIS — F331 Major depressive disorder, recurrent, moderate: Secondary | ICD-10-CM

## 2023-12-31 DIAGNOSIS — F419 Anxiety disorder, unspecified: Secondary | ICD-10-CM | POA: Diagnosis not present

## 2023-12-31 DIAGNOSIS — E860 Dehydration: Secondary | ICD-10-CM

## 2023-12-31 DIAGNOSIS — N179 Acute kidney failure, unspecified: Secondary | ICD-10-CM

## 2023-12-31 DIAGNOSIS — K591 Functional diarrhea: Secondary | ICD-10-CM

## 2023-12-31 LAB — CBC WITH DIFFERENTIAL/PLATELET
Basophils Absolute: 0.1 K/uL (ref 0.0–0.1)
Basophils Relative: 1 % (ref 0.0–3.0)
Eosinophils Absolute: 0.4 K/uL (ref 0.0–0.7)
Eosinophils Relative: 6.9 % — ABNORMAL HIGH (ref 0.0–5.0)
HCT: 34.5 % — ABNORMAL LOW (ref 39.0–52.0)
Hemoglobin: 10.9 g/dL — ABNORMAL LOW (ref 13.0–17.0)
Lymphocytes Relative: 15.3 % (ref 12.0–46.0)
Lymphs Abs: 0.9 K/uL (ref 0.7–4.0)
MCHC: 31.7 g/dL (ref 30.0–36.0)
MCV: 86.9 fl (ref 78.0–100.0)
Monocytes Absolute: 0.9 K/uL (ref 0.1–1.0)
Monocytes Relative: 14.8 % — ABNORMAL HIGH (ref 3.0–12.0)
Neutro Abs: 3.7 K/uL (ref 1.4–7.7)
Neutrophils Relative %: 62 % (ref 43.0–77.0)
Platelets: 264 K/uL (ref 150.0–400.0)
RBC: 3.97 Mil/uL — ABNORMAL LOW (ref 4.22–5.81)
RDW: 16.1 % — ABNORMAL HIGH (ref 11.5–15.5)
WBC: 5.9 K/uL (ref 4.0–10.5)

## 2023-12-31 LAB — BASIC METABOLIC PANEL WITH GFR
BUN: 26 mg/dL — ABNORMAL HIGH (ref 6–23)
CO2: 22 meq/L (ref 19–32)
Calcium: 8.8 mg/dL (ref 8.4–10.5)
Chloride: 106 meq/L (ref 96–112)
Creatinine, Ser: 2.68 mg/dL — ABNORMAL HIGH (ref 0.40–1.50)
GFR: 22.4 mL/min — ABNORMAL LOW (ref >60.00)
Glucose, Bld: 90 mg/dL (ref 70–99)
Potassium: 4 meq/L (ref 3.5–5.1)
Sodium: 136 meq/L (ref 135–145)

## 2023-12-31 MED ORDER — ESCITALOPRAM OXALATE 20 MG PO TABS: 20.0000 mg | ORAL_TABLET | Freq: Every morning | ORAL | 1 refills | Status: AC

## 2023-12-31 NOTE — Progress Notes (Signed)
 Labs stable

## 2023-12-31 NOTE — Progress Notes (Signed)
 Subjective:     Patient ID: Herbert Morrison, male    DOB: July 14, 1945, 78 y.o.   MRN: [id number]  Chief Complaint  Patient presents with   Hospitalization Follow-up    Pt feels like he is over medicated.     HPI 8/13-8/16 confusion. 78 year old male with ulcerative colitis, hypertension, rheumatoid arthritis, stage IV CKD, chronic uncontrolled diarrhea managed with Lomotil  and Imodium , depression and anxiety on multiple medications, back pain presented to the ED by referral from PCP for symptoms of right-sided flank pain and dysuria since yesterday and having confusion at home with delirium. Wife says he has been seeing people that are not present. He forgot how to use the remote control. He has been generally more weak and having difficulty getting out of bed in the last 24 hours. He has also had some slurring speech and difficulty walking. In the ED he was noted to be very dry with dry mucous membranes and his bladder showed a very minimal amount of urine. He was given IV fluids. His creatinine seems to be more elevated than normal with an elevated BUN level. His ammonia level was within normal limits. His CT head without contrast was within normal limits. His urinalysis did not show evidence of infection. Admission was requested for further management of acute metabolic encephalopathy secondary to acute on chronic renal failure.  Diarrhea-chronic d/t UC   Discussed the use of AI scribe software for clinical note transcription with the patient, who gave verbal consent to proceed.  History of Present Illness Herbert Morrison is a 78 year old male with ulcerative colitis and chronic kidney issues who presents with confusion and hallucinations. He is accompanied by his wife.  He was admitted to [facility] from August 13th to 16th due to sudden onset confusion, slurred speech, staggering, and hallucinations. He was not consuming alcohol at the time. His wife noted an inability to perform  tasks he previously could, which she attributed to signs of dementia. He also experienced an episode of hallucination while sitting in a chair. A follow-up appointment with a kidney specialist was canceled and needs to be rescheduled.  He has a history of ulcerative colitis, for which he underwent a colectomy. Post-surgery, he experiences chronic diarrhea, which has been difficult to control. He takes Lomotil  and Imodium  for diarrhea management. Taking Imodium  and two extra-strength Gas-X tablets has helped alleviate some discomfort, which he attributes mostly to gas. He describes his stomach as feeling like a 'war zone' at night, with audible sounds that can be heard by others. On admission, felt worsening of kidney function from dehydration  He experiences a constant internal shaking sensation, described as a nervous feeling rather than a visible tremor. This sensation comes and goes and does not seem to be related to food intake. He has been on Wellbutrin , which was recently increased to 300 mg, and also takes Lexapro  for depression. He feels very depressed and has not been able to find a psychiatrist. He takes lorazepam  twice a day, which helps with the shaky feeling some. This has been whole life.  He has a history of PTSD and believes it may be contributing to his symptoms. He is concerned about being overmedicated or having medication interactions. His current medications include amlodipine  5 mg for blood pressure, hydralazine  3 times a day for blood pressure, lorazepam  twice a day, and hydrocodone  as needed for pain(rare). He has run out of his escitalopram  prescription. He also takes Plaquenil , Xyzal, and  iron supplements.  He is frustrated with his inability to perform tasks he used to do, such as yard work, due to a lack of Comptroller. He is also frustrated with the difficulty in communication between his healthcare providers and with the process of qualifying for the [place], which has been  unhelpful in addressing his needs.     Health Maintenance Due  Topic Date Due   Medicare Annual Wellness (AWV)  Never done   DTaP/Tdap/Td (1 - Tdap) Never done    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   CKD (chronic kidney disease) stage 3, GFR 30-59 ml/min (HCC)    3b   Depression    Dyspnea    GERD (gastroesophageal reflux disease)    Grade I diastolic dysfunction 04/28/2023   Heart murmur    as a child   Hiatal hernia    History of hiatal hernia    Hypertension    Lupus 12/31/2020   Rheumatoid arthritis (HCC) 12/31/2020   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[facility])     Past Surgical History:  Procedure Laterality Date   BIOPSY  01/30/2021   Procedure: BIOPSY;  Surgeon: Golda Claudis PENNER, MD;  [facility];  Service: Endoscopy;;   CATARACT EXTRACTION Left 2023   COLONOSCOPY N/A 04/09/2016   Procedure: COLONOSCOPY;  Surgeon: Claudis PENNER Golda, MD;  [facility];  Service: Endoscopy;  Laterality: N/A;  1:45   COLONOSCOPY WITH PROPOFOL  N/A 01/30/2021   Procedure: COLONOSCOPY WITH PROPOFOL ;  Surgeon: Golda Claudis PENNER, MD;  [facility];  Service: Endoscopy;  Laterality: N/A;   COLONOSCOPY WITH PROPOFOL  N/A 02/17/2023   Procedure: COLONOSCOPY WITH PROPOFOL ;  Surgeon: Stacia Glendia BRAVO, MD;  [facility];  Service: Gastroenterology;  Laterality: N/A;   ELBOW BURSA SURGERY Left 1983   ESOPHAGOGASTRODUODENOSCOPY (EGD) WITH PROPOFOL  N/A 01/30/2021   Procedure: ESOPHAGOGASTRODUODENOSCOPY (EGD) WITH PROPOFOL ;  Surgeon: Golda Claudis PENNER, MD;  [facility];  Service: Endoscopy;  Laterality: N/A;  2:00, pt knows to arrive at 11:00   FLEXIBLE SIGMOIDOSCOPY N/A 12/25/2023   Procedure: KINGSTON SIDE;  Surgeon: Cinderella Deatrice FALCON, MD;  [facility];  Service: Endoscopy;  Laterality: N/A;   HEMOSTASIS CLIP PLACEMENT  02/17/2023   Procedure:  HEMOSTASIS CLIP PLACEMENT;  Surgeon: Stacia Glendia BRAVO, MD;  [facility];  Service: Gastroenterology;;   KNEE ARTHROSCOPY Right 1979   left elbow     spur removed   left knee surgery     open surgery for a meniscus tear 1970's   lungs  07/2022   thoracentesis   PARTIAL COLECTOMY  2024   polyps   POLYPECTOMY  04/09/2016   Procedure: POLYPECTOMY;  Surgeon: Claudis PENNER Golda, MD;  [facility];  Service: Endoscopy;;  cecal polyp   POLYPECTOMY  01/30/2021   Procedure: POLYPECTOMY;  Surgeon: Golda Claudis PENNER, MD;  [facility];  Service: Endoscopy;;   POLYPECTOMY N/A 02/17/2023   Procedure: POLYPECTOMY;  Surgeon: Stacia Glendia BRAVO, MD;  Location: [facility];  Service: Gastroenterology;  Laterality: N/A;   SUBMUCOSAL LIFTING INJECTION  02/17/2023   Procedure: SUBMUCOSAL LIFTING INJECTION;  Surgeon: Stacia Glendia BRAVO, MD;  Location: [facility];  Service: Gastroenterology;;   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   Procedure: LEFT TOTAL HIP ARTHROPLASTY ANTERIOR APPROACH;  Surgeon: Ernie Cough, MD;  Location: [facility];  Service: Orthopedics;  Laterality: Left;  70 mins   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   Procedure: TOTAL KNEE ARTHROPLASTY;  Surgeon: Ernie Cough, MD;  [facility];  Service: Orthopedics;  Laterality: Left;  70 mins   UMBILICAL HERNIA REPAIR N/A 05/09/2022   Procedure: PRIMARY UMBILICAL HERNIA REPAIR;  Surgeon: Sheldon Standing, MD;  [facility];  Service: General;  Laterality: N/A;   XI ROBOTIC ASSISTED PARAESOPHAGEAL HERNIA REPAIR N/A 05/09/2022   Procedure: ROBOTIC REPAIR OF PARAESOPHAGEAL HIATAL HERNIA WITH FUNDOPLICATION;  Surgeon: Sheldon Standing, MD;  [facility];  Service: General;  Laterality: N/A;  GEN w/ERAS LOCAL     Current Outpatient Medications:    acetaminophen  (TYLENOL ) 500 MG tablet, Take 1,000 mg by mouth every 6 (six) hours as needed for mild pain (pain score 1-3)., Disp: , Rfl:    amLODipine   (NORVASC ) 10 MG tablet, Take 1 tablet every day by oral route., Disp: , Rfl:    buPROPion  (WELLBUTRIN  XL) 300 MG 24 hr tablet, Take 1 tablet (300 mg total) by mouth in the morning., Disp: 90 tablet, Rfl: 1   Cyanocobalamin 3000 MCG CAPS, Take 1 capsule by mouth daily., Disp: , Rfl:    diphenoxylate -atropine  (LOMOTIL ) 2.5-0.025 MG tablet, Take 2 tablets by mouth 4 (four) times daily., Disp: , Rfl:    ferrous sulfate  325 (65 FE) MG tablet, Take 1 tablet every day by oral route., Disp: , Rfl:    fluticasone  (FLONASE ) 50 MCG/ACT nasal spray, Place 2 sprays into both nostrils as needed for allergies or rhinitis., Disp: , Rfl:    hydrALAZINE  (APRESOLINE ) 100 MG tablet, Take 100 mg by mouth 3 (three) times daily., Disp: , Rfl:    HYDROcodone -acetaminophen  (NORCO) 10-325 MG tablet, Take 1 tablet by mouth every 6 (six) hours as needed for moderate pain (pain score 4-6)., Disp: , Rfl:    hydroxychloroquine  (PLAQUENIL ) 200 MG tablet, Take 200 mg by mouth 2 (two) times daily., Disp: , Rfl:    ketoconazole  (NIZORAL ) 2 % shampoo, Apply 1 Application topically every other day. Every other shower, Disp: 120 mL, Rfl: 1   levocetirizine (XYZAL) 5 MG tablet, Take 10 mg by mouth daily., Disp: , Rfl:    loperamide  (IMODIUM ) 2 MG capsule, two capsules initially, then 2 mg after each subsquent loose stool not to exceed 16 mg per day, Disp: 30 capsule, Rfl: 2   LORazepam  (ATIVAN ) 2 MG tablet, Take 2 mg by mouth in the morning and at bedtime., Disp: , Rfl:    meclizine  (ANTIVERT ) 25 MG tablet, Take 25 mg by mouth 3 (three) times daily as needed (vertigo)., Disp: , Rfl:    methocarbamol  (ROBAXIN ) 500 MG tablet, Take 1 tablet (500 mg total) by mouth every 8 (eight) hours as needed for muscle spasms., Disp: 90 tablet, Rfl: 3   ondansetron  (ZOFRAN ) 4 MG tablet, Take 4 mg by mouth every 8 (eight) hours as needed for vomiting or nausea., Disp: , Rfl:    Probiotic Product (PROBIOTIC ADVANCED PO), Take 1 capsule by mouth daily.,  Disp: , Rfl:    SIMETHICONE  PO, Take 1 tablet by mouth in the morning, at noon, and at bedtime., Disp: , Rfl:    escitalopram  (LEXAPRO ) 20 MG tablet, Take 1 tablet (20 mg total) by mouth in the morning., Disp: 90 tablet, Rfl: 1  Allergies  Allergen Reactions   Other Anaphylaxis and Other (See Comments)    Rumicade   Remicade [Infliximab] Anaphylaxis   Sulfa Antibiotics Anaphylaxis, Diarrhea  and Nausea And Vomiting   Erythromycin Hives and Nausea And Vomiting   Mesalamine  Other (See Comments)    Unknown    Pollen Extract Other (See Comments)    Unknown    Sulfasalazine Nausea And Vomiting   Grass Pollen(K-O-R-T-Swt Vern) Other (See Comments)    Seasonal allergies   ROS neg/noncontributory except as noted HPI/below      Objective:     BP 122/60 (BP Location: Right Arm, Patient Position: Sitting, Cuff Size: Normal)   Pulse (!) 49   Temp (!) 97.3 F (36.3 C) (Temporal)   Ht 5' 5 (1.651 m)   Wt 183 lb 6.4 oz (83.2 kg)   SpO2 96%   BMI 30.52 kg/m  Wt Readings from Last 3 Encounters:  12/31/23 183 lb 6.4 oz (83.2 kg)  12/23/23 177 lb 7.5 oz (80.5 kg)  10/30/23 185 lb 2 oz (84 kg)    Physical Exam   Gen: WDWN NAD HEENT: NCAT, conjunctiva not injected, sclera nonicteric NECK:  supple, no thyromegaly, no nodes, no carotid bruits CARDIAC: RRR, S1S2+, no murmur. DP 2+B LUNGS: CTAB. No wheezes ABDOMEN:  BS+, soft, NTND, No HSM, no masses EXT:  no edema MSK: no gross abnormalities.  NEURO: A&O x3.  CN II-XII intact.  PSYCH: anxious mood. Good eye contact  Reviewed hosp records.  Med list reconciled.  No changes     Assessment & Plan:  AKI (acute kidney injury) (HCC) -     Basic metabolic panel with GFR -     CBC with Differential/Platelet  Anxiety -     Escitalopram  Oxalate; Take 1 tablet (20 mg total) by mouth in the morning.  Dispense: 90 tablet; Refill: 1 -     Ambulatory referral to Psychiatry  Dehydration  Functional diarrhea  Moderate episode of  recurrent major depressive disorder (HCC)  Assessment and Plan Assessment & Plan Hospital follow-up for metabolic encephalopathy and acute confusion (resolved)   Metabolic encephalopathy and acute confusion have resolved. Initially presented with slurred speech, staggering, and hallucinations, attributed to dehydration and kidney dysfunction, exacerbated by chronic diarrhea from ulcerative colitis post-colectomy. Hospitalized from August 13th to 16th. Monitor hydration status and kidney function. Encourage fluid intake with electrolytes, considering kidney function.  Chronic kidney disease  stage 4 Chronic kidney disease was exacerbated by dehydration during recent hospitalization. Coordination with nephrology is ongoing, but communication challenges exist. Contact nephrology to reschedule follow-up appointment. Monitor kidney function through blood work. Encourage hydration with appropriate electrolyte solutions, such as Cure or diluted Liquid IV.  Ulcerative colitis post-colectomy with chronic diarrhea   Chronic diarrhea secondary to ulcerative colitis post-colectomy contributes to dehydration and impacts kidney function. Recent use of Imodium  and Gas-X provided symptomatic relief. Continue Imodium  and Gas-X as needed for diarrhea and gas relief. Monitor hydration status and adjust electrolyte intake accordingly.  Depression and anxiety   Chronic depression and anxiety persist despite treatment with Lexapro  and Wellbutrin . Subjective internal shaking may be related to anxiety. No current psychiatrist involvement, with challenges in finding psychiatric care. Renew prescription for escitalopram . Refer to psychiatry for further evaluation and management. Consider adding Vraylar if psychiatric appointment is delayed but depression worsened w/Rexulti so hesitant to [employer] Refer psych for meds mgmt.  Post-traumatic stress disorder (PTSD)   PTSD contributes to anxiety and depression, with symptoms  including internal shaking and nervousness. No current psychiatric care. Refer to psychiatry for PTSD management.  Hypertension   Hypertension managed with amlodipine  and hydralazine . Recent blood pressure readings were  variable, with some readings unusually low.  Chronic pain   Chronic pain managed with hydrocodone  as needed. No regular use reported. Continue hydrocodone  as needed for pain management.  Fatigue and decreased strength   Fatigue and decreased strength impact daily activities, possibly due to chronic conditions and medication side effects. Encourage light physical activity as tolerated. Monitor for any changes in energy levels.  Tremulousness (subjective internal shaking)   Subjective internal shaking, possibly related to anxiety or medication side effects. No visible tremors observed. Refer to psychiatry for evaluation of anxiety and potential medication adjustments.    Return for as sch Oct 28.  Jenkins CHRISTELLA Carrel, MD

## 2023-12-31 NOTE — Patient Instructions (Addendum)
 It was very nice to see you today!  Referral to psych  Cure-electrolytes.  Or liquid IV.   PLEASE NOTE:  If you had any lab tests please let us  know if you have not heard back within a few days. You may see your results on MyChart before we have a chance to review them but we will give you a call once they are reviewed by us . If we ordered any referrals today, please let us  know if you have not heard from their office within the next week.   Please try these tips to maintain a healthy lifestyle:  Eat most of your calories during the day when you are active. Eliminate processed foods including packaged sweets (pies, cakes, cookies), reduce intake of potatoes, white bread, white pasta, and white rice. Look for whole grain options, oat flour or almond flour.  Each meal should contain half fruits/vegetables, one quarter protein, and one quarter carbs (no bigger than a computer mouse).  Cut down on sweet beverages. This includes juice, soda, and sweet tea. Also watch fruit intake, though this is a healthier sweet option, it still contains natural sugar! Limit to 3 servings daily.  Drink at least 1 glass of water  with each meal and aim for at least 8 glasses per day  Exercise at least 150 minutes every week.

## 2024-01-04 ENCOUNTER — Ambulatory Visit (INDEPENDENT_AMBULATORY_CARE_PROVIDER_SITE_OTHER): Payer: Self-pay | Admitting: Gastroenterology

## 2024-01-13 ENCOUNTER — Other Ambulatory Visit: Payer: Self-pay | Admitting: Gastroenterology

## 2024-01-14 ENCOUNTER — Other Ambulatory Visit: Payer: Self-pay | Admitting: Gastroenterology

## 2024-01-15 ENCOUNTER — Other Ambulatory Visit: Payer: Self-pay | Admitting: Gastroenterology

## 2024-01-18 ENCOUNTER — Encounter (INDEPENDENT_AMBULATORY_CARE_PROVIDER_SITE_OTHER): Payer: Self-pay | Admitting: *Deleted

## 2024-01-18 NOTE — Progress Notes (Signed)
 Patient result letter mailed

## 2024-01-20 ENCOUNTER — Other Ambulatory Visit: Payer: Self-pay | Admitting: Gastroenterology

## 2024-01-20 ENCOUNTER — Ambulatory Visit (INDEPENDENT_AMBULATORY_CARE_PROVIDER_SITE_OTHER)

## 2024-01-20 VITALS — Ht 66.0 in | Wt 178.0 lb

## 2024-01-20 DIAGNOSIS — Z0000 Encounter for general adult medical examination without abnormal findings: Secondary | ICD-10-CM

## 2024-01-20 NOTE — Telephone Encounter (Signed)
 Inbound call from patients wife stating that patient is in need for a refill for Lomotil . She states that he also had a flex sig in the hospital and is requesting a call back to discuss if he needs to keep the appointment for 10/17. Please advise.

## 2024-01-20 NOTE — Patient Instructions (Signed)
 [name],  Thank you for taking the time for your Medicare Wellness Visit. I appreciate your continued commitment to your health goals. Please review the care plan we discussed, and feel free to reach out if I can assist you further.  Medicare recommends these wellness visits once per year to help you and your care team stay ahead of potential health issues. These visits are designed to focus on prevention, allowing your provider to concentrate on managing your acute and chronic conditions during your regular appointments.  Please note that Annual Wellness Visits do not include a physical exam. Some assessments may be limited, especially if the visit was conducted virtually. If needed, we may recommend a separate in-person follow-up with your provider.  Ongoing Care Seeing your primary care provider every 3 to 6 months helps us  monitor your health and provide consistent, personalized care.   Referrals If a referral was made during today's visit and you haven't received any updates within two weeks, please contact the referred provider directly to check on the status.  Recommended Screenings:  Health Maintenance  Topic Date Due   Medicare Annual Wellness Visit  Never done   DTaP/Tdap/Td vaccine (1 - Tdap) Never done   COVID-19 Vaccine (5 - 2025-26 season) 01/11/2024   Zoster (Shingles) Vaccine (2 of 2) 04/01/2024*   Flu Shot  08/09/2024*   Pneumococcal Vaccine for age over 36 (2 of 2 - PCV) 12/30/2024*   Hepatitis C Screening  12/30/2024*   Colon Cancer Screening  02/17/2028   HPV Vaccine  Aged Out   Meningitis B Vaccine  Aged Out  *Topic was postponed. The date shown is not the original due date.       12/23/2023    7:01 PM  Advanced Directives  Does Patient Have a Medical Advance Directive? No  Would patient like information on creating a medical advance directive? No - Patient declined   Advance Care Planning is important because it: Ensures you receive medical care that  aligns with your values, goals, and preferences. Provides guidance to your family and loved ones, reducing the emotional burden of decision-making during critical moments.  Vision: Annual vision screenings are recommended for early detection of glaucoma, cataracts, and diabetic retinopathy. These exams can also reveal signs of chronic conditions such as diabetes and high blood pressure.  Dental: Annual dental screenings help detect early signs of oral cancer, gum disease, and other conditions linked to overall health, including heart disease and diabetes.  Please see the attached documents for additional preventive care recommendations.

## 2024-01-20 NOTE — Progress Notes (Signed)
 Subjective:   Herbert Morrison is a 78 y.o. who presents for a Medicare Wellness preventive visit.  As a reminder, Annual Wellness Visits don't include a physical exam, and some assessments may be limited, especially if this visit is performed virtually. We may recommend an in-person follow-up visit with your provider if needed.  Visit Complete: Virtual I connected with  NAVID LENZEN on 01/20/24 by a audio enabled telemedicine application and verified that I am speaking with the correct person using two identifiers.  Patient Location: Home  Provider Location: Home Office  I discussed the limitations of evaluation and management by telemedicine. The patient expressed understanding and agreed to proceed.  Vital Signs: Because this visit was a virtual/telehealth visit, some criteria may be missing or patient reported. Any vitals not documented were not able to be obtained and vitals that have been documented are patient reported.  VideoDeclined- This patient declined Librarian, academic. Therefore the visit was completed with audio only.  Persons Participating in Visit: Patient.  AWV Questionnaire: No: Patient Medicare AWV questionnaire was not completed prior to this visit.  Cardiac Risk Factors include: advanced age (>5men, >60 women);hypertension;dyslipidemia;male gender     Objective:    Today's Vitals   01/20/24 0919  Weight: 178 lb (80.7 kg)  Height: 5' 6 (1.676 m)   Body mass index is 28.73 kg/m.     01/20/2024    9:24 AM 12/23/2023    7:01 PM 12/23/2023    2:29 PM 05/16/2023   12:02 AM 04/27/2023    8:28 AM 02/17/2023    9:02 AM 11/12/2022   10:57 AM  Advanced Directives  Does Patient Have a Medical Advance Directive? Yes No No Yes No Yes Yes  Type of Estate agent of [place];Living will   --  Living will   Does patient want to make changes to medical advance directive?    No - Patient declined   Yes  (MAU/Ambulatory/Procedural Areas - Information given)  Copy of Healthcare Power of Attorney in Chart? No - copy requested        Would patient like information on creating a medical advance directive?  No - Patient declined         Current Medications (verified) Outpatient Encounter Medications as of 01/20/2024  Medication Sig   acetaminophen  (TYLENOL ) 500 MG tablet Take 1,000 mg by mouth every 6 (six) hours as needed for mild pain (pain score 1-3).   amLODipine  (NORVASC ) 10 MG tablet Take 1 tablet every day by oral route.   buPROPion  (WELLBUTRIN  XL) 300 MG 24 hr tablet Take 1 tablet (300 mg total) by mouth in the morning.   Cyanocobalamin 3000 MCG CAPS Take 1 capsule by mouth daily.   diphenoxylate -atropine  (LOMOTIL ) 2.5-0.025 MG tablet Take 2 tablets by mouth 4 (four) times daily.   escitalopram  (LEXAPRO ) 20 MG tablet Take 1 tablet (20 mg total) by mouth in the morning.   ferrous sulfate  325 (65 FE) MG tablet Take 1 tablet every day by oral route.   fluticasone  (FLONASE ) 50 MCG/ACT nasal spray Place 2 sprays into both nostrils as needed for allergies or rhinitis.   hydrALAZINE  (APRESOLINE ) 100 MG tablet Take 100 mg by mouth 3 (three) times daily.   HYDROcodone -acetaminophen  (NORCO) 10-325 MG tablet Take 1 tablet by mouth every 6 (six) hours as needed for moderate pain (pain score 4-6).   hydroxychloroquine  (PLAQUENIL ) 200 MG tablet Take 200 mg by mouth 2 (two) times daily.   ketoconazole  (  NIZORAL ) 2 % shampoo Apply 1 Application topically every other day. Every other shower   levocetirizine (XYZAL) 5 MG tablet Take 10 mg by mouth daily.   loperamide  (IMODIUM ) 2 MG capsule two capsules initially, then 2 mg after each subsquent loose stool not to exceed 16 mg per day   LORazepam  (ATIVAN ) 2 MG tablet Take 2 mg by mouth in the morning and at bedtime.   meclizine  (ANTIVERT ) 25 MG tablet Take 25 mg by mouth 3 (three) times daily as needed (vertigo).   methocarbamol  (ROBAXIN ) 500 MG tablet Take 1  tablet (500 mg total) by mouth every 8 (eight) hours as needed for muscle spasms.   ondansetron  (ZOFRAN ) 4 MG tablet Take 4 mg by mouth every 8 (eight) hours as needed for vomiting or nausea.   Probiotic Product (PROBIOTIC ADVANCED PO) Take 1 capsule by mouth daily.   SIMETHICONE  PO Take 1 tablet by mouth in the morning, at noon, and at bedtime.   No facility-administered encounter medications on file as of 01/20/2024.    Allergies (verified) Other, Remicade [infliximab], Sulfa antibiotics, Erythromycin, Mesalamine , Pollen extract, Sulfasalazine, and Grass pollen([id number]-swt vern)   History: 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   CKD (chronic kidney disease) stage 3, GFR 30-59 ml/min (HCC)    3b   Depression    Dyspnea    GERD (gastroesophageal reflux disease)    Grade I diastolic dysfunction 04/28/2023   Heart murmur    as a child   Hiatal hernia    History of hiatal hernia    Hypertension    Lupus 12/31/2020   Rheumatoid arthritis (HCC) 12/31/2020   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[facility])    Past Surgical History:  Procedure Laterality Date   BIOPSY  01/30/2021   Procedure: BIOPSY;  Surgeon: Golda Claudis PENNER, MD;  [facility];  Service: Endoscopy;;   CATARACT EXTRACTION Left 2023   COLONOSCOPY N/A 04/09/2016   Procedure: COLONOSCOPY;  Surgeon: Claudis PENNER Golda, MD;  [facility];  Service: Endoscopy;  Laterality: N/A;  1:45   COLONOSCOPY WITH PROPOFOL  N/A 01/30/2021   Procedure: COLONOSCOPY WITH PROPOFOL ;  Surgeon: Golda Claudis PENNER, MD;  [facility];  Service: Endoscopy;  Laterality: N/A;   COLONOSCOPY WITH PROPOFOL  N/A 02/17/2023   Procedure: COLONOSCOPY WITH PROPOFOL ;  Surgeon: Stacia Glendia BRAVO, MD;  [facility];  Service: Gastroenterology;  Laterality: N/A;   ELBOW BURSA SURGERY Left 1983   ESOPHAGOGASTRODUODENOSCOPY (EGD) WITH PROPOFOL  N/A  01/30/2021   Procedure: ESOPHAGOGASTRODUODENOSCOPY (EGD) WITH PROPOFOL ;  Surgeon: Golda Claudis PENNER, MD;  [facility];  Service: Endoscopy;  Laterality: N/A;  2:00, pt knows to arrive at 11:00   FLEXIBLE SIGMOIDOSCOPY N/A 12/25/2023   Procedure: KINGSTON SIDE;  Surgeon: Cinderella Deatrice FALCON, MD;  [facility];  Service: Endoscopy;  Laterality: N/A;   HEMOSTASIS CLIP PLACEMENT  02/17/2023   Procedure: HEMOSTASIS CLIP PLACEMENT;  Surgeon: Stacia Glendia BRAVO, MD;  [facility];  Service: Gastroenterology;;   KNEE ARTHROSCOPY Right 1979   left elbow     spur removed   left knee surgery     open surgery for a meniscus tear 1970's   lungs  07/2022   thoracentesis   PARTIAL COLECTOMY  2024   polyps   POLYPECTOMY  04/09/2016   Procedure: POLYPECTOMY;  Surgeon: Claudis PENNER Golda, MD;  [facility];  Service: Endoscopy;;  cecal polyp   POLYPECTOMY  01/30/2021   Procedure: POLYPECTOMY;  Surgeon: Golda Claudis PENNER, MD;  [facility];  Service: Endoscopy;;   POLYPECTOMY N/A 02/17/2023   Procedure: POLYPECTOMY;  Surgeon: Stacia Glendia BRAVO, MD;  Location: [facility];  Service: Gastroenterology;  Laterality: N/A;   SUBMUCOSAL LIFTING INJECTION  02/17/2023   Procedure: SUBMUCOSAL LIFTING INJECTION;  Surgeon: Stacia Glendia BRAVO, MD;  Location: [facility];  Service: Gastroenterology;;   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   Procedure: LEFT TOTAL HIP ARTHROPLASTY ANTERIOR APPROACH;  Surgeon: Ernie Cough, MD;  [facility];  Service: Orthopedics;  Laterality: Left;  70 mins   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   Procedure: TOTAL KNEE ARTHROPLASTY;  Surgeon: Ernie Cough, MD;  [facility];  Service: Orthopedics;  Laterality: Left;  70 mins   UMBILICAL HERNIA REPAIR N/A 05/09/2022   Procedure: PRIMARY UMBILICAL HERNIA REPAIR;  Surgeon: Sheldon Standing, MD;  [facility];  Service: General;  Laterality: N/A;   XI ROBOTIC  ASSISTED PARAESOPHAGEAL HERNIA REPAIR N/A 05/09/2022   Procedure: ROBOTIC REPAIR OF PARAESOPHAGEAL HIATAL HERNIA WITH FUNDOPLICATION;  Surgeon: Sheldon Standing, MD;  [facility];  Service: General;  Laterality: N/A;  GEN w/ERAS LOCAL   Family History  Problem Relation Age of Onset   Leukemia Father    Bladder Cancer Father    Stomach cancer Neg Hx    Esophageal cancer Neg Hx    Colon cancer Neg Hx    Pancreatic cancer Neg Hx    Colon polyps Neg Hx    Rectal cancer Neg Hx    Social History   Socioeconomic History   Marital status: Married    Spouse name: Not on file   Number of children: 2   Years of education: Not on file   Highest education level: Bachelor's degree (e.g., BA, AB, BS)  Occupational History   Not on file  Tobacco Use   Smoking status: Former    Current packs/day: 0.00    Average packs/day: 1 pack/day for 5.0 years (5.0 ttl pk-yrs)    Types: Cigarettes    Start date: 30    Quit date: 78    Years since quitting: 50.7   Smokeless tobacco: Former    Types: Chew    Quit date: 01/22/1965   Tobacco comments:    quit 43 years ago  Vaping Use   Vaping status: Never Used  Substance and Sexual Activity   Alcohol use: Not Currently    Comment: none   Drug use: No   Sexual activity: Yes  Other Topics Concern   Not on file  Social History Narrative   No grands   [place] -3 yrs   Retired -Secondary school teacher, massage therapy   Social Drivers of Health   Financial Resource Strain: Low Risk  (01/20/2024)   Overall Financial Resource Strain (CARDIA)    Difficulty of Paying Living Expenses: Not hard at all  Food Insecurity: No Food Insecurity (01/20/2024)   Hunger Vital Sign    Worried About Running Out of Food in the Last Year: Never true    Ran Out of Food in the Last Year: Never true  Transportation Needs: No Transportation Needs (01/20/2024)   PRAPARE - Administrator, Civil Service (Medical): No    Lack of Transportation (Non-Medical): No   Physical Activity: Insufficiently Active (01/20/2024)   Exercise Vital Sign    Days of Exercise per Week:  3 days    Minutes of Exercise per Session: 10 min  Stress: No Stress Concern Present (01/20/2024)   [employer] of Occupational Health - Occupational Stress Questionnaire    Feeling of Stress: Only a little  Social Connections: Moderately Isolated (01/20/2024)   Social Connection and Isolation Panel    Frequency of Communication with Friends and Family: Three times a week    Frequency of Social Gatherings with Friends and Family: Three times a week    Attends Religious Services: Never    Active Member of Clubs or Organizations: No    Attends Banker Meetings: Never    Marital Status: Married    Tobacco Counseling Counseling given: Not Answered Tobacco comments: quit 43 years ago    Clinical Intake:  Pre-visit preparation completed: Yes  Pain : No/denies pain     BMI - recorded: 28.73 Nutritional Status: BMI 25 -29 Overweight Nutritional Risks: None Diabetes: No  Lab Results  Component Value Date   HGBA1C 5.1 12/23/2023     How often do you need to have someone help you when you read instructions, pamphlets, or other written materials from your doctor or pharmacy?: 1 - Never  Interpreter Needed?: No  Information entered by :: Ellouise Haws, LPN   Activities of Daily Living      01/20/2024    9:20 AM 12/23/2023    7:01 PM  In your present state of health, do you have any difficulty performing the following activities:  Hearing? 0 0  Vision? 0 0  Difficulty concentrating or making decisions? 0 0  Walking or climbing stairs? 1   Comment very slowly due to balance   Dressing or bathing? 0   Doing errands, shopping? 0 0  Preparing Food and eating ? N   Using the Toilet? N   In the past six months, have you accidently leaked urine? N   Do you have problems with loss of bowel control? Y   Comment at times   Managing your Medications? N    Managing your Finances? N   Housekeeping or managing your Housekeeping? N     Patient Care Team: Wendolyn Jenkins Jansky, MD as PCP - General (Family Medicine) Zagol, Redell Flash, MD as Referring Physician (Cardiology) Sheldon Standing, MD as Consulting Physician (General Surgery) Stacia Glendia BRAVO, MD as Consulting Physician (Gastroenterology) Gladis Leonor HERO, MD as Consulting Physician (Pulmonary Disease)   I have updated your Care Teams any recent Medical Services you may have received from other providers in the past year.     Assessment:   This is a routine wellness examination for [facility].  Hearing/Vision screen Hearing Screening - Comments:: Pt denies any hearing issues  Vision Screening - Comments:: Wears rx glasses - up to date with routine eye exams with [facility]    Goals Addressed             This Visit's Progress    Patient Stated       Get stronger and more active       Depression Screen     01/20/2024    9:24 AM 12/31/2023    8:28 AM 09/29/2023    5:10 PM 09/29/2023    3:57 PM  PHQ 2/9 Scores  PHQ - 2 Score 1 2 4  0  PHQ- 9 Score 1 12 18      Fall Risk     01/20/2024    9:26 AM 12/31/2023    8:28 AM  Fall Risk  Falls in the past year? 1 1  Number falls in past yr: 0 0  Injury with Fall? 0 0  Risk for fall due to : Impaired balance/gait;Impaired mobility;History of fall(s) No Fall Risks  Follow up Falls prevention discussed Falls evaluation completed    MEDICARE RISK AT HOME:  Medicare Risk at Home Any stairs in or around the home?: Yes If so, are there any without handrails?: No Home free of loose throw rugs in walkways, pet beds, electrical cords, etc?: Yes Adequate lighting in your home to reduce risk of falls?: Yes Life alert?: No Use of a cane, walker or w/c?: No Grab bars in the bathroom?: No Shower chair or bench in shower?: Yes Elevated toilet seat or a handicapped toilet?: No  TIMED UP AND GO:  Was the test performed?   No  Cognitive Function: 6CIT completed        01/20/2024    9:27 AM  6CIT Screen  What Year? 0 points  What month? 0 points  What time? 0 points  Count back from 20 0 points  Months in reverse 0 points  Repeat phrase 0 points  Total Score 0 points    Immunizations Immunization History  Administered Date(s) Administered   Fluad Quad(high Dose 65+) 02/26/2017, 03/26/2020, 02/25/2022   INFLUENZA, HIGH DOSE SEASONAL PF 06/12/2016, 03/01/2023   Influenza Whole 04/11/2014, 02/09/2017   Influenza,inj,quad, With Preservative 02/09/2017, 02/10/2020   Influenza-Unspecified 05/01/2011, 02/24/2015, 06/12/2016, 02/26/2017, 03/11/2019, 03/26/2020, 03/04/2023   Moderna Sars-Covid-2 Vaccination 06/04/2019, 07/02/2019, 03/30/2020   Pneumococcal Polysaccharide-23 03/26/2020   Unspecified SARS-COV-2 Vaccination 08/11/2019   Zoster Recombinant(Shingrix) 03/26/2020    Screening Tests Health Maintenance  Topic Date Due   DTaP/Tdap/Td (1 - Tdap) Never done   COVID-19 Vaccine (5 - 2025-26 season) 01/11/2024   Zoster Vaccines- Shingrix (2 of 2) 04/01/2024 (Originally 05/21/2020)   Influenza Vaccine  08/09/2024 (Originally 12/11/2023)   Pneumococcal Vaccine: 50+ Years (2 of 2 - PCV) 12/30/2024 (Originally 03/26/2021)   Hepatitis C Screening  12/30/2024 (Originally 06/30/1963)   Medicare Annual Wellness (AWV)  01/19/2025   Colonoscopy  02/17/2028   HPV VACCINES  Aged Out   Meningococcal B Vaccine  Aged Out    Health Maintenance Items Addressed: See Nurse Notes at the end of this note  Additional Screening:  Vision Screening: Recommended annual ophthalmology exams for early detection of glaucoma and other disorders of the eye. Is the patient up to date with their annual eye exam?  Yes  Who is the provider or what is the name of the office in which the patient attends annual eye exams? [facility]   Dental Screening: Recommended annual dental exams for proper oral hygiene  Community Resource  Referral / Chronic Care Management: CRR required this visit?  No   CCM required this visit?  No   Plan:    I have personally reviewed and noted the following in the patient's chart:   Medical and social history Use of alcohol, tobacco or illicit drugs  Current medications and supplements including opioid prescriptions. Patient is currently taking opioid prescriptions. Information provided to patient regarding non-opioid alternatives. Patient advised to discuss non-opioid treatment plan with their provider. Functional ability and status Nutritional status Physical activity Advanced directives List of other physicians Hospitalizations, surgeries, and ER visits in previous 12 months Vitals Screenings to include cognitive, depression, and falls Referrals and appointments  In addition, I have reviewed and discussed with patient certain preventive protocols, quality metrics, and best practice recommendations. A written  personalized care plan for preventive services as well as general preventive health recommendations were provided to patient.   Ellouise VEAR Haws, LPN   [id number]   After Visit Summary: (MyChart) Due to this being a telephonic visit, the after visit summary with patients personalized plan was offered to patient via MyChart   Notes: Nothing significant to report at this time.

## 2024-01-20 NOTE — Telephone Encounter (Signed)
 Dr Stacia,  Are you okay with refilling patient's lomotil  rx? If so, would you please send the pended rx through [facility] to patient pharmacy?   Also, note patient was hospitalized 12/23/23-12/26/23 at [facility]. He had flex sigmoidoscopy while there.  He is currently scheduled in [facility] on 03/10/24 for recall flexible sigmoidoscopy Does he need to keep this appointment since he had one inpatient?

## 2024-01-21 ENCOUNTER — Other Ambulatory Visit: Payer: Self-pay | Admitting: Gastroenterology

## 2024-01-21 MED ORDER — DIPHENOXYLATE-ATROPINE 2.5-0.025 MG PO TABS: 2.0000 | ORAL_TABLET | Freq: Four times a day (QID) | ORAL | 3 refills | Status: DC

## 2024-01-21 NOTE — Telephone Encounter (Signed)
 Received a call from patients wife stating she is still waiting on refill, but is also requesting fu call to discuss further scheduling. Please review and advise  Thank you

## 2024-01-22 ENCOUNTER — Telehealth: Payer: Self-pay | Admitting: Gastroenterology

## 2024-01-22 ENCOUNTER — Other Ambulatory Visit: Payer: Self-pay | Admitting: Gastroenterology

## 2024-01-22 NOTE — Telephone Encounter (Signed)
   Flexible sigmoidoscopy recall edited to reflect change to 12/2024.   01/27/24 flexible sigmoidoscopy has been cancelled.   Lomotil  Rx was sent to patient's pharmacy yesterday by Dr Stacia.

## 2024-01-22 NOTE — Telephone Encounter (Signed)
 Prescription for Lomotil  has been faxed to [facility]. Patient's wife states her husband was recently hospitalized at [facility] and had a flexible sigmoidoscopy. Patient's wife states her husband is scheduled to have a recall flex sig with Dr. Stacia in October. This was already scheduled prior to the hospitalization. Patient's wife would like to know if he needs to keep this appt since he has already had a procedure. Informed patient I will have Dr. Stacia to review and return her call. Dr. Stacia please advise.

## 2024-01-22 NOTE — Telephone Encounter (Signed)
 Inbound call from patient wife requesting to know if her husband is still needing to have a colonoscopy even though it was done at the hospital. 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 Please advise.

## 2024-01-22 NOTE — Telephone Encounter (Signed)
 Patient wife called stating that her husband is needing a refill on his medication called LOMOTIL  2.5-0.025 MG tablet. Please advise.

## 2024-01-23 NOTE — Telephone Encounter (Signed)
 I have spoken to Mrs.Angell to advised that patient no longer needs flexible sigmoidoscopy 01/27/24 since he had an inpatient procedure. Advised a recall sigmoidoscopy has been placed in our system for 12/2024. Additionally, advised that a lomotil  script was sent to the pharmacy for patient pick up. Mrs.Angert says that she did pick up lomotil  at the pharmacy on Friday. States patient has been taking lomotil  2 tablets four times daily as wlel as imodium  up to 8 tablets daily. Says patient has been in the hospital twice recently for severe dehydration related to diarrhea and this continues to be a problem. Offered patient an appointment with Dr Stacia in follow up 02/16/24 to discuss any additional options for evaluation/treatment, however Mrs.Millikan says she cannot bring patient this date. She has instead scheduled patient to see Dr Stacia in follow up on 02/29/24.

## 2024-01-25 NOTE — Telephone Encounter (Signed)
 See other telephone message from 01/22/24.

## 2024-02-09 ENCOUNTER — Encounter

## 2024-02-24 ENCOUNTER — Encounter (INDEPENDENT_AMBULATORY_CARE_PROVIDER_SITE_OTHER): Payer: Self-pay | Admitting: Gastroenterology

## 2024-02-26 ENCOUNTER — Other Ambulatory Visit: Admitting: Gastroenterology

## 2024-02-29 ENCOUNTER — Ambulatory Visit (INDEPENDENT_AMBULATORY_CARE_PROVIDER_SITE_OTHER): Admitting: Gastroenterology

## 2024-02-29 ENCOUNTER — Encounter: Payer: Self-pay | Admitting: Gastroenterology

## 2024-02-29 VITALS — BP 120/80 | HR 75 | Ht 66.0 in | Wt 174.0 lb

## 2024-02-29 DIAGNOSIS — N184 Chronic kidney disease, stage 4 (severe): Secondary | ICD-10-CM | POA: Diagnosis not present

## 2024-02-29 DIAGNOSIS — K51919 Ulcerative colitis, unspecified with unspecified complications: Secondary | ICD-10-CM | POA: Diagnosis not present

## 2024-02-29 DIAGNOSIS — Z9049 Acquired absence of other specified parts of digestive tract: Secondary | ICD-10-CM

## 2024-02-29 DIAGNOSIS — K580 Irritable bowel syndrome with diarrhea: Secondary | ICD-10-CM

## 2024-02-29 MED ORDER — RIFAXIMIN 550 MG PO TABS: 550.0000 mg | ORAL_TABLET | Freq: Three times a day (TID) | ORAL | 0 refills | Status: AC

## 2024-02-29 NOTE — Progress Notes (Unsigned)
 HPI :      2 lomotil  4x/day 2 immodium qam and then 1 after each loose stool.  Taking Citrucel capsules Taking probiotic lactobacillus   Interval history: Admitted to [facility] in August 13-16 with AKI and altered mental status Flex sig with normal appearing ileocolonic anastomosis.  Normal appearing rectum without inflammation.  Biopsies taken and showed architectural distortion but no active inflammation or dysplasia. 5 mm rectal polyp.  Nephrology    CBC:  WBC 7, Hgb 12.6, MCV 87, Plt 382 CMP:  141/4.4/110/12/34/2.52 TP 7, Alb 4.87, AST 16, ALT 12, ALP 64  10lb weight loss over 4-5 months    Herbert Morrison is a 78 y.o. male with a history of ulcerative colitis, lupus, rheumatoid arthritis and GERD/hiatal hernia status post hernia repair/fundoplication who presents for follow-up after recent subtotal colectomy.  He had undergone a routine dysplasia surveillance colonoscopy in July 2024 and was found to have numerous large, flat and indistinct polyps in the right colon.  A few polyps were biopsied or removed and were consistent with hyperplastic and sessile serrated polyps.  He underwent a repeat colonoscopy in October 2024 with attempted resection, but due to the vast number, size and indistinct morphology of some of the polyps, adequate resection was not felt to be possible.  He experienced a post polypectomy bleed following this colonoscopy and was hospitalized for a few days with observation only.  No repeat colonoscopy was needed.  He was referred to colorectal surgery, and after discussion with [facility]'s IBD and advanced endoscopy teams, he was recommended to undergo prophylactic colectomy.  He underwent a laparoscopic subtotal colectomy April 06, 2023.  The surgery was uncomplicated and pathology showed numerous sessile serrated adenomas, focal active colitis and diverticulosis.  No malignancy.  He was admitted for a few days in the middle of December with rectal bleeding  and a hemoglobin drop from 10 to7.  He received a unit packed red blood cells and a CTA was negative.  The bleeding subsided spontaneously.  His Eliquis was discontinued.   He was admitted again in early January with generalized weakness and altered mental status attributed to dehydration.  He was noted to have acute kidney injury with creatinine of 3.9, which improved to 1.9 on discharge.  His baseline creatinine prior to his surgery was around 1.2-1.3.   Since the surgery, he has been experiencing persistent diarrhea, with bowel movements ranging from four to eight times a day, sometimes more. He experiences urgency and occasional false alarms where he feels the need to defecate but only passes gas.    He was last seen by me in February at which time I recommended increasing his antimotility regimen to Imodium  to 8 tabs/day and adding Lomotil  PRN, 1-2 tabs q6hrs We rechecked a CBC, iron panel and renal function.  CBC showed hgb 10, improved from 7.7, but ongoing iron deficiency, and continued renal insufficiency, with Scr of 2.02.  A nephrology consult was ordered, but it does not appear he was ever seen by nephrology.   Today, he reports persistent diarrhea since his surgery in December, with six to seven bowel movements per day on average, with usually one of these at night. The stools are mostly liquid, with occasional 'cow patty' consistency. He frequently has urges to defecate that result in passing gas instead of stool. He takes two Lomotil  tablets three times a day with meals but has not used Imodium  recently (he did not realize he could take the Lomotil   and imodium  together). There is no blood in the stool or intestinal cramps.   He experiences significant fatigue and weakness, impacting his ability to walk even short distances. A friend assists him with walking, but he often cannot walk far. He lacks energy and feels very tired most of the time. Although he sleeps well at night, he often wakes  up due to gas rumbling in his stomach and sometimes needs to have bowel movements at night. He takes Lomotil  before bed to manage this.   He has a poor appetite and often finds food unappealing, eating only a few bites before losing interest. He experiences severe hiccups after eating but denies nausea, vomiting, heartburn, or acid reflux. He has experienced significant weight loss, now weighing 184-185 pounds.  Prior to his colectomy he was 220lbs.   He is anemic despite taking iron and B12 supplements, and he lacks stamina and strength. He has not seen a kidney specialist.   His history includes lupus, currently managed with Plaquenil . He was previously on prednisone  but is no longer taking it after completing a high-dose course. He uses lactate milk in coffee and adds about a teaspoon of sugar, avoiding high-sugar foods and artificial sweeteners, opting for natural sugars like honey  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   CKD (chronic kidney disease) stage 3, GFR 30-59 ml/min (HCC)    3b   Depression    Dyspnea    GERD (gastroesophageal reflux disease)    Grade I diastolic dysfunction 04/28/2023   Heart murmur    as a child   Hiatal hernia    History of hiatal hernia    Hypertension    Lupus 12/31/2020   Rheumatoid arthritis (HCC) 12/31/2020   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[facility])      Past Surgical History:  Procedure Laterality Date   BIOPSY  01/30/2021   Procedure: BIOPSY;  Surgeon: Golda Claudis PENNER, MD;  [facility];  Service: Endoscopy;;   CATARACT EXTRACTION Left 2023   COLONOSCOPY N/A 04/09/2016   Procedure: COLONOSCOPY;  Surgeon: Claudis PENNER Golda, MD;  [facility];  Service: Endoscopy;  Laterality: N/A;  1:45   COLONOSCOPY WITH PROPOFOL  N/A 01/30/2021   Procedure: COLONOSCOPY WITH PROPOFOL ;  Surgeon: Golda Claudis PENNER, MD;  [facility];  Service:  Endoscopy;  Laterality: N/A;   COLONOSCOPY WITH PROPOFOL  N/A 02/17/2023   Procedure: COLONOSCOPY WITH PROPOFOL ;  Surgeon: Stacia Glendia BRAVO, MD;  [facility];  Service: Gastroenterology;  Laterality: N/A;   ELBOW BURSA SURGERY Left 1983   ESOPHAGOGASTRODUODENOSCOPY (EGD) WITH PROPOFOL  N/A 01/30/2021   Procedure: ESOPHAGOGASTRODUODENOSCOPY (EGD) WITH PROPOFOL ;  Surgeon: Golda Claudis PENNER, MD;  [facility];  Service: Endoscopy;  Laterality: N/A;  2:00, pt knows to arrive at 11:00   FLEXIBLE SIGMOIDOSCOPY N/A 12/25/2023   Procedure: KINGSTON SIDE;  Surgeon: Cinderella Deatrice FALCON, MD;  [facility];  Service: Endoscopy;  Laterality: N/A;   HEMOSTASIS CLIP PLACEMENT  02/17/2023   Procedure: HEMOSTASIS CLIP PLACEMENT;  Surgeon: Stacia Glendia BRAVO, MD;  [facility];  Service: Gastroenterology;;   KNEE ARTHROSCOPY Right 1979   left elbow     spur removed   left knee surgery     open surgery for a meniscus tear 1970's   lungs  07/2022   thoracentesis   PARTIAL COLECTOMY  2024  polyps   POLYPECTOMY  04/09/2016   Procedure: POLYPECTOMY;  Surgeon: Claudis RAYMOND Rivet, MD;  [facility];  Service: Endoscopy;;  cecal polyp   POLYPECTOMY  01/30/2021   Procedure: POLYPECTOMY;  Surgeon: Rivet Claudis RAYMOND, MD;  [facility];  Service: Endoscopy;;   POLYPECTOMY N/A 02/17/2023   Procedure: POLYPECTOMY;  Surgeon: Stacia Glendia BRAVO, MD;  Location: [facility];  Service: Gastroenterology;  Laterality: N/A;   SUBMUCOSAL LIFTING INJECTION  02/17/2023   Procedure: SUBMUCOSAL LIFTING INJECTION;  Surgeon: Stacia Glendia BRAVO, MD;  Location: [facility];  Service: Gastroenterology;;   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   Procedure: LEFT TOTAL HIP ARTHROPLASTY ANTERIOR APPROACH;  Surgeon: Ernie Cough, MD;  [facility];  Service: Orthopedics;  Laterality: Left;  70 mins   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   Procedure: TOTAL  KNEE ARTHROPLASTY;  Surgeon: Ernie Cough, MD;  [facility];  Service: Orthopedics;  Laterality: Left;  70 mins   UMBILICAL HERNIA REPAIR N/A 05/09/2022   Procedure: PRIMARY UMBILICAL HERNIA REPAIR;  Surgeon: Sheldon Standing, MD;  [facility];  Service: General;  Laterality: N/A;   XI ROBOTIC ASSISTED PARAESOPHAGEAL HERNIA REPAIR N/A 05/09/2022   Procedure: ROBOTIC REPAIR OF PARAESOPHAGEAL HIATAL HERNIA WITH FUNDOPLICATION;  Surgeon: Sheldon Standing, MD;  [facility];  Service: General;  Laterality: N/A;  GEN w/ERAS LOCAL   Family History  Problem Relation Age of Onset   Leukemia Father    Bladder Cancer Father    Stomach cancer Neg Hx    Esophageal cancer Neg Hx    Colon cancer Neg Hx    Pancreatic cancer Neg Hx    Colon polyps Neg Hx    Rectal cancer Neg Hx    Social History   Tobacco Use   Smoking status: Former    Current packs/day: 0.00    Average packs/day: 1 pack/day for 5.0 years (5.0 ttl pk-yrs)    Types: Cigarettes    Start date: 37    Quit date: 63    Years since quitting: 50.8   Smokeless tobacco: Former    Types: Chew    Quit date: 01/22/1965   Tobacco comments:    quit 43 years ago  Vaping Use   Vaping status: Never Used  Substance Use Topics   Alcohol use: Not Currently    Comment: none   Drug use: No   Current Outpatient Medications  Medication Sig Dispense Refill   acetaminophen  (TYLENOL ) 500 MG tablet Take 1,000 mg by mouth every 6 (six) hours as needed for mild pain (pain score 1-3).     amLODipine  (NORVASC ) 10 MG tablet Take 1 tablet every day by oral route.     buPROPion  (WELLBUTRIN  XL) 300 MG 24 hr tablet Take 1 tablet (300 mg total) by mouth in the morning. 90 tablet 1   Cyanocobalamin 3000 MCG CAPS Take 1 capsule by mouth daily.     diphenoxylate -atropine  (LOMOTIL ) 2.5-0.025 MG tablet TAKE 1-2 CAPS BY MOUTH FOUR TIMES A DAY AS NEEDED FOR LOOSE STOOLS OR DIARRHEA 180 tablet 3   escitalopram  (LEXAPRO ) 20 MG tablet Take 1 tablet (20  mg total) by mouth in the morning. 90 tablet 1   ferrous sulfate  325 (65 FE) MG tablet Take 1 tablet every day by oral route.     fluticasone  (FLONASE ) 50 MCG/ACT nasal spray Place 2 sprays into both nostrils as needed for allergies or rhinitis.     hydrALAZINE  (APRESOLINE ) 100 MG tablet Take 100  mg by mouth 3 (three) times daily.     HYDROcodone -acetaminophen  (NORCO) 10-325 MG tablet Take 1 tablet by mouth every 6 (six) hours as needed for moderate pain (pain score 4-6).     hydroxychloroquine  (PLAQUENIL ) 200 MG tablet Take 200 mg by mouth 2 (two) times daily.     ketoconazole  (NIZORAL ) 2 % shampoo Apply 1 Application topically every other day. Every other shower 120 mL 1   levocetirizine (XYZAL) 5 MG tablet Take 10 mg by mouth daily.     loperamide  (IMODIUM ) 2 MG capsule two capsules initially, then 2 mg after each subsquent loose stool not to exceed 16 mg per day 30 capsule 2   LORazepam  (ATIVAN ) 2 MG tablet Take 2 mg by mouth in the morning and at bedtime.     meclizine  (ANTIVERT ) 25 MG tablet Take 25 mg by mouth 3 (three) times daily as needed (vertigo).     methocarbamol  (ROBAXIN ) 500 MG tablet Take 1 tablet (500 mg total) by mouth every 8 (eight) hours as needed for muscle spasms. 90 tablet 3   ondansetron  (ZOFRAN ) 4 MG tablet Take 4 mg by mouth every 8 (eight) hours as needed for vomiting or nausea.     Probiotic Product (PROBIOTIC ADVANCED PO) Take 1 capsule by mouth daily.     SIMETHICONE  PO Take 1 tablet by mouth in the morning, at noon, and at bedtime.     No current facility-administered medications for this visit.   Allergies  Allergen Reactions   Other Anaphylaxis and Other (See Comments)    Rumicade   Remicade [Infliximab] Anaphylaxis   Sulfa Antibiotics Anaphylaxis, Diarrhea and Nausea And Vomiting   Erythromycin Hives and Nausea And Vomiting   Mesalamine  Other (See Comments)    Unknown    Pollen Extract Other (See Comments)    Unknown    Sulfasalazine Nausea And  Vomiting   Grass Pollen(K-O-R-T-Swt Vern) Other (See Comments)    Seasonal allergies     Review of Systems: All systems reviewed and negative except where noted in HPI.    No results found.  Physical Exam: BP 120/80   Pulse 75   Ht 5' 6 (1.676 m)   Wt 174 lb (78.9 kg)   BMI 28.08 kg/m 001 Constitutional: Pleasant,well-developed, ***male in no acute distress. HEENT: Normocephalic and atraumatic. Conjunctivae are normal. No scleral icterus. Neck supple.  Cardiovascular: Normal rate, regular rhythm.  Pulmonary/chest: Effort normal and breath sounds normal. No wheezing, rales or rhonchi. Abdominal: Soft, nondistended, nontender. Bowel sounds active throughout. There are no masses palpable. No hepatomegaly. Extremities: no edema Lymphadenopathy: No cervical adenopathy noted. Neurological: Alert and oriented to person place and time. Skin: Skin is warm and dry. No rashes noted. Psychiatric: Normal mood and affect. Behavior is normal.  CBC    Component Value Date/Time   WBC 5.9 12/31/2023 0917   RBC 3.97 (L) 12/31/2023 0917   HGB 10.9 (L) 12/31/2023 0917   HCT 34.5 (L) 12/31/2023 0917   PLT 264.0 12/31/2023 0917   MCV 86.9 12/31/2023 0917   MCH 28.1 12/25/2023 0430   MCHC 31.7 12/31/2023 0917   RDW 16.1 (H) 12/31/2023 0917   LYMPHSABS 0.9 12/31/2023 0917   MONOABS 0.9 12/31/2023 0917   EOSABS 0.4 12/31/2023 0917   BASOSABS 0.1 12/31/2023 0917    CMP     Component Value Date/Time   NA 136 12/31/2023 0917   K 4.0 12/31/2023 0917   CL 106 12/31/2023 0917   CO2 22 12/31/2023 0917  GLUCOSE 90 12/31/2023 0917   BUN 26 (H) 12/31/2023 0917   CREATININE 2.68 (H) 12/31/2023 0917   CREATININE 1.50 (H) 04/15/2021 1314   CALCIUM  8.8 12/31/2023 0917   CALCIUM  9.1 12/25/2023 0430   PROT 6.8 12/23/2023 1438   ALBUMIN  3.2 (L) 12/26/2023 0452   AST 13 (L) 12/23/2023 1438   ALT 10 12/23/2023 1438   ALKPHOS 49 12/23/2023 1438   BILITOT 0.6 12/23/2023 1438   GFRNONAA 28  (L) 12/26/2023 0452   GFRAA >60 08/12/2019 0427       Latest Ref Rng & Units 12/31/2023    9:17 AM 12/25/2023    4:30 AM 12/24/2023    4:32 AM  CBC EXTENDED  WBC 4.0 - 10.5 K/uL 5.9  5.9  5.4   RBC 4.22 - 5.81 Mil/uL 3.97  3.77  3.76   Hemoglobin 13.0 - 17.0 g/dL 89.0  89.3  89.5   HCT 39.0 - 52.0 % 34.5  32.8  33.1   Platelets 150.0 - 400.0 K/uL 264.0  270  250   NEUT# 1.4 - 7.7 K/uL 3.7     Lymph# 0.7 - 4.0 K/uL 0.9         ASSESSMENT AND PLAN:  Wendolyn Jenkins Jansky, MD

## 2024-02-29 NOTE — Patient Instructions (Signed)
 Keep a food diary for 4 weeks.  We have sent the following medications to your pharmacy for you to pick up at your convenience: Rifaximin 550 mg three times a day for 14 days.  We are giving you a Low-FODMAP diet handout today. FODMAPs are short-chain carbohydrates (sugars) that are highly fermentable, which means that they go through chemical changes in the GI system, and are poorly absorbed during digestion. When FODMAPs reach the colon (large intestine), bacteria ferment these sugars, turning them into gas and chemicals. This stretches the walls of the colon, causing abdominal bloating, distension, cramping, pain, and/or changes in bowel habits in many patients with IBS. FODMAPs are not unhealthy or harmful, but may exacerbate GI symptoms in those with sensitive GI tracts.  Follow up in 4 months.  Thank you for entrusting me with your care and for choosing [facility],  Dr. Glendia Holt  _______________________________________________________  If your blood pressure at your visit was 140/90 or greater, please contact your primary care physician to follow up on this.  _______________________________________________________  If you are age 76 or older, your body mass index should be between 23-30. Your Body mass index is 28.08 kg/m. If this is out of the aforementioned range listed, please consider follow up with your Primary Care Provider.  If you are age 76 or younger, your body mass index should be between 19-25. Your Body mass index is 28.08 kg/m. If this is out of the aformentioned range listed, please consider follow up with your Primary Care Provider.   ________________________________________________________  The [facility] providers would like to encourage you to use MYCHART to communicate with providers for non-urgent requests or questions.  Due to long hold times on the telephone, sending your provider a message by [facility] may be a faster and more efficient way to  get a response.  Please allow 48 business hours for a response.  Please remember that this is for non-urgent requests.  _______________________________________________________  [facility] is using a team-based approach to care.  Your team is made up of your doctor and two to three APPS. Our APPS (Nurse Practitioners and Physician Assistants) work with your physician to ensure care continuity for you. They are fully qualified to address your health concerns and develop a treatment plan. They communicate directly with your gastroenterologist to care for you. Seeing the Advanced Practice Practitioners on your physician's team can help you by facilitating care more promptly, often allowing for earlier appointments, access to diagnostic testing, procedures, and other specialty referrals.

## 2024-03-01 ENCOUNTER — Other Ambulatory Visit (HOSPITAL_COMMUNITY): Payer: Self-pay

## 2024-03-01 ENCOUNTER — Telehealth: Payer: Self-pay

## 2024-03-01 NOTE — Telephone Encounter (Signed)
 Pharmacy Patient Advocate Encounter   Received notification from CoverMyMeds that prior authorization for Xifaxan 550MG  tablets is required/requested.   Insurance verification completed.   The patient is insured through [employer].   Prior Authorization for Xifaxan 550MG  tablets has been APPROVED from 12-01-2023 to 03-01-2025. Ran test claim, Copay is $846.44**. This test claim was processed through [facility]- copay amounts may vary at other pharmacies due to pharmacy/plan contracts, or as the patient moves through the different stages of their insurance plan.   PA #/Case ID/Reference #: [id number]   *Patient is eligible for the Medicare Prescription Payment Plan Program. Patient will need to contact insurance for further information and to sign up.

## 2024-03-04 ENCOUNTER — Other Ambulatory Visit: Payer: Self-pay | Admitting: Family Medicine

## 2024-03-04 MED ORDER — LORAZEPAM 2 MG PO TABS: 2.0000 mg | ORAL_TABLET | Freq: Two times a day (BID) | ORAL | 0 refills | Status: DC

## 2024-03-04 NOTE — Telephone Encounter (Signed)
 Refill request for Lorazepam ; Last fill, 10/23/22; last OV 12/31/23

## 2024-03-04 NOTE — Telephone Encounter (Signed)
 Copied from CRM [id number]. Topic: Clinical - Medication Refill >> Mar 04, 2024  9:20 AM [place] A wrote: Medication: LORazepam  (ATIVAN ) 2 MG tablet  Has the patient contacted their pharmacy? Yes (Agent: If no, request that the patient contact the pharmacy for the refill. If patient does not wish to contact the pharmacy document the reason why and proceed with request.) (Agent: If yes, when and what did the pharmacy advise?) Has not been sent  This is the patient's preferred pharmacy:   [facility] - [place], [place] - [address] [place] [address] Phone: [phone] Fax: [phone]  Is this the correct pharmacy for this prescription? Yes If no, delete pharmacy and type the correct one.   Has the prescription been filled recently? No  Is the patient out of the medication? Yes  Has the patient been seen for an appointment in the last year OR does the patient have an upcoming appointment? Yes  Can we respond through MyChart? Yes  Agent: Please be advised that Rx refills may take up to 3 business days. We ask that you follow-up with your pharmacy.

## 2024-03-08 ENCOUNTER — Encounter: Payer: Self-pay | Admitting: Family Medicine

## 2024-03-08 ENCOUNTER — Ambulatory Visit: Payer: Self-pay | Admitting: Family Medicine

## 2024-03-08 ENCOUNTER — Ambulatory Visit: Admitting: Family Medicine

## 2024-03-08 VITALS — BP 120/70 | HR 78 | Temp 98.1°F | Ht 66.0 in | Wt 178.5 lb

## 2024-03-08 DIAGNOSIS — K5120 Ulcerative (chronic) proctitis without complications: Secondary | ICD-10-CM

## 2024-03-08 DIAGNOSIS — N184 Chronic kidney disease, stage 4 (severe): Secondary | ICD-10-CM | POA: Diagnosis not present

## 2024-03-08 DIAGNOSIS — M0579 Rheumatoid arthritis with rheumatoid factor of multiple sites without organ or systems involvement: Secondary | ICD-10-CM

## 2024-03-08 DIAGNOSIS — F419 Anxiety disorder, unspecified: Secondary | ICD-10-CM | POA: Diagnosis not present

## 2024-03-08 DIAGNOSIS — Z23 Encounter for immunization: Secondary | ICD-10-CM

## 2024-03-08 DIAGNOSIS — I10 Essential (primary) hypertension: Secondary | ICD-10-CM

## 2024-03-08 DIAGNOSIS — Z79899 Other long term (current) drug therapy: Secondary | ICD-10-CM | POA: Diagnosis not present

## 2024-03-08 LAB — CBC WITH DIFFERENTIAL/PLATELET
Basophils Absolute: 0.1 K/uL (ref 0.0–0.1)
Basophils Relative: 0.8 % (ref 0.0–3.0)
Eosinophils Absolute: 0.3 K/uL (ref 0.0–0.7)
Eosinophils Relative: 5.6 % — ABNORMAL HIGH (ref 0.0–5.0)
HCT: 33.8 % — ABNORMAL LOW (ref 39.0–52.0)
Hemoglobin: 11 g/dL — ABNORMAL LOW (ref 13.0–17.0)
Lymphocytes Relative: 13.7 % (ref 12.0–46.0)
Lymphs Abs: 0.8 K/uL (ref 0.7–4.0)
MCHC: 32.5 g/dL (ref 30.0–36.0)
MCV: 85.8 fl (ref 78.0–100.0)
Monocytes Absolute: 0.8 K/uL (ref 0.1–1.0)
Monocytes Relative: 13.2 % — ABNORMAL HIGH (ref 3.0–12.0)
Neutro Abs: 4.1 K/uL (ref 1.4–7.7)
Neutrophils Relative %: 66.7 % (ref 43.0–77.0)
Platelets: 272 K/uL (ref 150.0–400.0)
RBC: 3.94 Mil/uL — ABNORMAL LOW (ref 4.22–5.81)
RDW: 15.1 % (ref 11.5–15.5)
WBC: 6.1 K/uL (ref 4.0–10.5)

## 2024-03-08 LAB — COMPREHENSIVE METABOLIC PANEL WITH GFR
ALT: 10 U/L (ref 0–53)
AST: 15 U/L (ref 0–37)
Albumin: 3.9 g/dL (ref 3.5–5.2)
Alkaline Phosphatase: 49 U/L (ref 39–117)
BUN: 28 mg/dL — ABNORMAL HIGH (ref 6–23)
CO2: 23 meq/L (ref 19–32)
Calcium: 9 mg/dL (ref 8.4–10.5)
Chloride: 109 meq/L (ref 96–112)
Creatinine, Ser: 2.25 mg/dL — ABNORMAL HIGH (ref 0.40–1.50)
GFR: 27.59 mL/min — ABNORMAL LOW (ref >60.00)
Glucose, Bld: 102 mg/dL — ABNORMAL HIGH (ref 70–99)
Potassium: 3.7 meq/L (ref 3.5–5.1)
Sodium: 140 meq/L (ref 135–145)
Total Bilirubin: 0.5 mg/dL (ref 0.2–1.2)
Total Protein: 6.1 g/dL (ref 6.0–8.3)

## 2024-03-08 LAB — VITAMIN B12: Vitamin B-12: >1500 pg/mL — ABNORMAL HIGH (ref 211–911)

## 2024-03-08 MED ORDER — HYDROCODONE-ACETAMINOPHEN 10-325 MG PO TABS: 1.0000 | ORAL_TABLET | Freq: Four times a day (QID) | ORAL | 0 refills | Status: AC | PRN

## 2024-03-08 MED ORDER — LORAZEPAM 2 MG PO TABS: 2.0000 mg | ORAL_TABLET | Freq: Two times a day (BID) | ORAL | 5 refills | Status: AC

## 2024-03-08 NOTE — Patient Instructions (Signed)
 It was very nice to see you today!  Check bp in am and pm and see if really needs the middle of day hydralazine    PLEASE NOTE:  If you had any lab tests please let us  know if you have not heard back within a few days. You may see your results on MyChart before we have a chance to review them but we will give you a call once they are reviewed by us . If we ordered any referrals today, please let us  know if you have not heard from their office within the next week.   Please try these tips to maintain a healthy lifestyle:  Eat most of your calories during the day when you are active. Eliminate processed foods including packaged sweets (pies, cakes, cookies), reduce intake of potatoes, white bread, white pasta, and white rice. Look for whole grain options, oat flour or almond flour.  Each meal should contain half fruits/vegetables, one quarter protein, and one quarter carbs (no bigger than a computer mouse).  Cut down on sweet beverages. This includes juice, soda, and sweet tea. Also watch fruit intake, though this is a healthier sweet option, it still contains natural sugar! Limit to 3 servings daily.  Drink at least 1 glass of water  with each meal and aim for at least 8 glasses per day  Exercise at least 150 minutes every week.

## 2024-03-08 NOTE — Progress Notes (Signed)
 Subjective:     Patient ID: Herbert Morrison, male    DOB: 1945/09/02, 78 y.o.   MRN: [id number]  Chief Complaint  Patient presents with   Hypertension    Pt here for 3 month follow up for hypertension    Discussed the use of AI scribe software for clinical note transcription with the patient, who gave verbal consent to proceed.  History of Present Illness Herbert Morrison is a 78 year old male with ulcerative colitis and rheumatoid arthritis who presents with right-sided abdominal pain. He is accompanied by his wife.  He experiences intermittent right-sided abdominal pain described as a 'little stitch' that sometimes radiates to the back. The pain is sharp initially and then subsides, lasting a couple of hours before resolving. It is not related to meals and can occur at any time. He occasionally uses muscle relaxants like Robaxin  for relief. He is uncertain if his gallbladder has been checked recently, though he has had abdominal scans in the past.  He has a history of ulcerative colitis and rheumatoid arthritis. He is currently taking Plaquenil  once daily for rheumatoid arthritis, reduced from a higher dose due to kidney concerns. He also takes Xifaxan, and he and his wife report that he has been feeling better and having fewer 'bad days' since starting this medication. He has less severe 'bad days' since starting this medication.  He is on multiple medications for hypertension, including amlodipine  10 mg daily and hydralazine , which he takes twice a day instead of the prescribed three times due to forgetfulness. His blood pressure readings are generally good, with a recent reading of 120/70 mmHg. He experiences shortness of breath with exertion and feels fatigued and unstable when walking, often needing to hold onto walls or furniture for support.  He takes Wellbutrin  300 mg, escitalopram  20 mg, and lorazepam  2 mg twice daily for mood stabilization. He missed several doses of  lorazepam  recently, which coincided with increased anxiety and emotional lability. He also takes hydrocodone  as needed for arthritis pain, which he uses sparingly.  He reports a decreased appetite, often eating only small portions throughout the day. He has lost weight and sometimes only consumes a piece of cheese or a small amount of food for meals. He is concerned about maintaining adequate nutrition.  He has a history of kidney issues and is scheduled to see a nephrologist in a month. He has not seen the rheumatologist recently but has an upcoming appointment. He is concerned about his kidney function, which was noted to have improved slightly at the last check.    There are no preventive care reminders to display for this patient.  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   CKD (chronic kidney disease) stage 3, GFR 30-59 ml/min (HCC)    3b   Depression    Dyspnea    GERD (gastroesophageal reflux disease)    Grade I diastolic dysfunction 04/28/2023   Heart murmur    as a child   Hiatal hernia    History of hiatal hernia    Hypertension    Lupus 12/31/2020   Rheumatoid arthritis (HCC) 12/31/2020   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[facility])     Past Surgical History:  Procedure Laterality Date   BIOPSY  01/30/2021   Procedure: BIOPSY;  Surgeon: Golda Claudis PENNER, MD;  [facility];  Service: Endoscopy;;  CATARACT EXTRACTION Left 2023   COLONOSCOPY N/A 04/09/2016   Procedure: COLONOSCOPY;  Surgeon: Claudis RAYMOND Rivet, MD;  [facility];  Service: Endoscopy;  Laterality: N/A;  1:45   COLONOSCOPY WITH PROPOFOL  N/A 01/30/2021   Procedure: COLONOSCOPY WITH PROPOFOL ;  Surgeon: Rivet Claudis RAYMOND, MD;  [facility];  Service: Endoscopy;  Laterality: N/A;   COLONOSCOPY WITH PROPOFOL  N/A 02/17/2023   Procedure: COLONOSCOPY WITH PROPOFOL ;  Surgeon: Stacia Glendia BRAVO, MD;  [facility]: [facility];  Service: Gastroenterology;  Laterality: N/A;   ELBOW BURSA SURGERY Left 1983   ESOPHAGOGASTRODUODENOSCOPY (EGD) WITH PROPOFOL  N/A 01/30/2021   Procedure: ESOPHAGOGASTRODUODENOSCOPY (EGD) WITH PROPOFOL ;  Surgeon: Rivet Claudis RAYMOND, MD;  [facility];  Service: Endoscopy;  Laterality: N/A;  2:00, pt knows to arrive at 11:00   FLEXIBLE SIGMOIDOSCOPY N/A 12/25/2023   Procedure: KINGSTON SIDE;  Surgeon: Cinderella Deatrice FALCON, MD;  [facility];  Service: Endoscopy;  Laterality: N/A;   HEMOSTASIS CLIP PLACEMENT  02/17/2023   Procedure: HEMOSTASIS CLIP PLACEMENT;  Surgeon: Stacia Glendia BRAVO, MD;  [facility];  Service: Gastroenterology;;   KNEE ARTHROSCOPY Right 1979   left elbow     spur removed   left knee surgery     open surgery for a meniscus tear 1970's   lungs  07/2022   thoracentesis   PARTIAL COLECTOMY  2024   polyps   POLYPECTOMY  04/09/2016   Procedure: POLYPECTOMY;  Surgeon: Claudis RAYMOND Rivet, MD;  [facility];  Service: Endoscopy;;  cecal polyp   POLYPECTOMY  01/30/2021   Procedure: POLYPECTOMY;  Surgeon: Rivet Claudis RAYMOND, MD;  [facility];  Service: Endoscopy;;   POLYPECTOMY N/A 02/17/2023   Procedure: POLYPECTOMY;  Surgeon: Stacia Glendia BRAVO, MD;  Location: [facility];  Service: Gastroenterology;  Laterality: N/A;   SUBMUCOSAL LIFTING INJECTION  02/17/2023   Procedure: SUBMUCOSAL LIFTING INJECTION;  Surgeon: Stacia Glendia BRAVO, MD;  Location: [facility];  Service: Gastroenterology;;   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   Procedure: LEFT TOTAL HIP ARTHROPLASTY ANTERIOR APPROACH;  Surgeon: Ernie Cough, MD;  [facility];  Service: Orthopedics;  Laterality: Left;  70 mins   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   Procedure: TOTAL KNEE ARTHROPLASTY;  Surgeon: Ernie Cough, MD;  [facility];  Service: Orthopedics;  Laterality: Left;  70 mins   UMBILICAL HERNIA REPAIR N/A 05/09/2022    Procedure: PRIMARY UMBILICAL HERNIA REPAIR;  Surgeon: Sheldon Standing, MD;  [facility];  Service: General;  Laterality: N/A;   XI ROBOTIC ASSISTED PARAESOPHAGEAL HERNIA REPAIR N/A 05/09/2022   Procedure: ROBOTIC REPAIR OF PARAESOPHAGEAL HIATAL HERNIA WITH FUNDOPLICATION;  Surgeon: Sheldon Standing, MD;  [facility];  Service: General;  Laterality: N/A;  GEN w/ERAS LOCAL     Current Outpatient Medications:    acetaminophen  (TYLENOL ) 500 MG tablet, Take 1,000 mg by mouth every 6 (six) hours as needed for mild pain (pain score 1-3)., Disp: , Rfl:    amLODipine  (NORVASC ) 10 MG tablet, Take 1 tablet every day by oral route., Disp: , Rfl:    buPROPion  (WELLBUTRIN  XL) 300 MG 24 hr tablet, Take 1 tablet (300 mg total) by mouth in the morning., Disp: 90 tablet, Rfl: 1   diphenoxylate -atropine  (LOMOTIL ) 2.5-0.025 MG tablet, TAKE 1-2 CAPS BY MOUTH FOUR TIMES A DAY AS NEEDED FOR LOOSE STOOLS OR DIARRHEA (Patient taking differently: 2 tablets four times a day), Disp: 180 tablet, Rfl: 3   escitalopram  (LEXAPRO ) 20  MG tablet, Take 1 tablet (20 mg total) by mouth in the morning., Disp: 90 tablet, Rfl: 1   Ferrous Sulfate  (IRON PO), Take by mouth. 625 mg daily, Disp: , Rfl:    fluticasone  (FLONASE ) 50 MCG/ACT nasal spray, Place 2 sprays into both nostrils as needed for allergies or rhinitis., Disp: , Rfl:    hydrALAZINE  (APRESOLINE ) 100 MG tablet, Take 100 mg by mouth 3 (three) times daily., Disp: , Rfl:    hydroxychloroquine  (PLAQUENIL ) 200 MG tablet, Take 200 mg by mouth 2 (two) times daily., Disp: , Rfl:    ketoconazole  (NIZORAL ) 2 % shampoo, Apply 1 Application topically every other day. Every other shower, Disp: 120 mL, Rfl: 1   levocetirizine (XYZAL) 5 MG tablet, Take 10 mg by mouth daily., Disp: , Rfl:    loperamide  (IMODIUM ) 2 MG capsule, two capsules initially, then 2 mg after each subsquent loose stool not to exceed 16 mg per day, Disp: 30 capsule, Rfl: 2   meclizine  (ANTIVERT ) 25 MG tablet, Take  25 mg by mouth 3 (three) times daily as needed (vertigo)., Disp: , Rfl:    methocarbamol  (ROBAXIN ) 500 MG tablet, Take 1 tablet (500 mg total) by mouth every 8 (eight) hours as needed for muscle spasms., Disp: 90 tablet, Rfl: 3   metoCLOPramide  (REGLAN ) 5 MG tablet, Take 5 mg by mouth. As needed, Disp: , Rfl:    MULTIPLE VITAMIN PO, Take by mouth. Mens formula one daily, Disp: , Rfl:    ondansetron  (ZOFRAN ) 4 MG tablet, Take 4 mg by mouth every 8 (eight) hours as needed for vomiting or nausea., Disp: , Rfl:    OVER THE COUNTER MEDICATION, Fiber tablets daily, Disp: , Rfl:    OVER THE COUNTER MEDICATION, Imodium  mixed with gas x : 2 in the AM and 1 after each loose stool, Disp: , Rfl:    Probiotic Product (PROBIOTIC ADVANCED PO), Take 1 capsule by mouth daily., Disp: , Rfl:    rifaximin (XIFAXAN) 550 MG TABS tablet, Take 1 tablet (550 mg total) by mouth 3 (three) times daily., Disp: 42 tablet, Rfl: 0   SIMETHICONE  PO, Take 1 tablet by mouth in the morning, at noon, and at bedtime., Disp: , Rfl:    Zinc  50 MG TABS, Take by mouth. One daily, Disp: , Rfl:    HYDROcodone -acetaminophen  (NORCO) 10-325 MG tablet, Take 1 tablet by mouth every 6 (six) hours as needed for moderate pain (pain score 4-6)., Disp: 30 tablet, Rfl: 0   LORazepam  (ATIVAN ) 2 MG tablet, Take 1 tablet (2 mg total) by mouth in the morning and at bedtime., Disp: 60 tablet, Rfl: 5  Allergies  Allergen Reactions   Other Anaphylaxis and Other (See Comments)    Rumicade   Remicade [Infliximab] Anaphylaxis   Sulfa Antibiotics Anaphylaxis, Diarrhea and Nausea And Vomiting   Erythromycin Hives and Nausea And Vomiting   Mesalamine  Other (See Comments)    Unknown    Pollen Extract Other (See Comments)    Unknown    Sulfasalazine Nausea And Vomiting   Grass Pollen(K-O-R-T-Swt Vern) Other (See Comments)    Seasonal allergies   ROS neg/noncontributory except as noted HPI/below      Objective:     BP 120/70 (BP Location: Left Arm,  Patient Position: Sitting, Cuff Size: Normal)   Pulse 78   Temp 98.1 F (36.7 C) (Temporal)   Ht 5' 6 (1.676 m)   Wt 178 lb 8 oz (81 kg)   SpO2 98%   BMI  28.81 kg/m  Wt Readings from Last 3 Encounters:  03/08/24 178 lb 8 oz (81 kg)  02/29/24 174 lb (78.9 kg)  01/20/24 178 lb (80.7 kg)    Physical Exam GENERAL: Well developed well nourished no acute distress HEAD EYES EARS NOSE THROAT: Normocephalic atraumatic, conjunctiva not injected, sclera nonicteric CARDIAC: Regular rate and rhythm, S1 S2 present , no murmur, dorsalis pedis 2 plus bilaterally NECK: Supple, no thyromegaly, no nodes, no carotid bruits LUNGS: Clear to auscultation bilaterally, no wheezes ABDOMEN: Bowel sounds present, soft, non tender non distended, no hepatosplenomegaly, no masses EXTREMITIES: No edema MUSCULOSKELETAL: No gross abnormalities NEUROLOGICAL: Alert and oriented x3, cranial nerves II through XII intact PSYCHIATRIC: Normal mood, good eye contact       Assessment & Plan:  Essential hypertension -     CBC with Differential/Platelet  Stage 4 chronic kidney disease (HCC) -     Comprehensive metabolic panel with GFR  Anxiety  Rheumatoid arthritis involving multiple sites with positive rheumatoid factor (HCC)  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  Dispense: 60 tablet; Refill: 5 -     HYDROcodone -Acetaminophen ; Take 1 tablet by mouth every 6 (six) hours as needed for moderate pain (pain score 4-6).  Dispense: 30 tablet; Refill: 0    Assessment and Plan Assessment & Plan Right upper quadrant abdominal pain   He experiences intermittent sharp pain in the right upper quadrant radiating to the back, not linked to meals and resolving spontaneously. The differential  diagnosis includes muscle spasms, colon spasms related to ulcerative colitis, or scar tissue from previous surgery. No recent imaging of the gallbladder has been done. The pain is not severe enough to cause vomiting or require immediate intervention. Monitor symptoms and seek medical attention if the pain becomes severe, persistent, or associated with vomiting.  Ulcerative colitis   Symptoms are improving with rifaximin, and he reports increased energy. The cost of rifaximin has been reduced from $3000 to $849 through a special program, enhancing accessibility. Continue rifaximin as prescribed.  Rheumatoid arthritis   Chronic pain is managed with Plaquenil , with dosage reduced due to renal concerns. He experiences sharp pain and uses hydrocodone  as needed. NSAIDs are contraindicated due to bleeding risk and renal issues. Topical treatments like Voltaren gel are considered safer. Continue Plaquenil  as prescribed, use hydrocodone  as needed for severe pain, avoid NSAIDs, and consider topical treatments like Voltaren gel for localized pain.  Hypertension   Blood pressure is well-controlled with amlodipine  10 mg and hydralazine . He often misses the midday hydralazine  dose, leading to twice-daily dosing instead of thrice. The recent reading is 120/70 mmHg, with concern for hypotension causing dizziness and fatigue with thrice-daily dosing. Monitor blood pressure at home, especially if taking hydralazine  twice daily. Consider adjusting hydralazine  dosing based on home blood pressure readings and check blood pressure in the morning and evening to assess the need for the midday dose.  Chronic kidney disease   Renal function remains suboptimal despite previous improvement. A nephrology follow-up is scheduled in a month, and blood work will monitor renal function. Follow up with the nephrologist as scheduled and monitor renal function regularly.  Depression and anxiety   Managed with Wellbutrin , escitalopram ,  and lorazepam . Missed lorazepam   doses have led to increased anxiety and distress, with noted prescription refill issues. There is a risk of overdose when combining lorazepam  with hydrocodone  due to respiratory depression. Ensure lorazepam  prescription is filled and refilled on time, monitor mood and anxiety levels, and avoid taking lorazepam  and hydrocodone  close together to prevent overdose risk.  General Health Maintenance   He received an influenza vaccination and has no diabetes. There is weight loss and decreased appetite with nutritional concerns. He is advised against CBD oils due to potential psychotic reactions, especially with existing mood disorders. Encourage small, frequent meals to maintain nutrition, monitor weight and nutritional intake, and avoid CBD oils due to the risk of psychotic reactions.  Follow-up   Regular follow-up with specialists is planned, including appointments with the rheumatologist on November 25 and the nephrologist in a month.     Return in about 3 months (around 06/08/2024) for chronic follow-up.  Jenkins CHRISTELLA Carrel, MD

## 2024-03-08 NOTE — Progress Notes (Signed)
 Labs are stable.

## 2024-03-28 MED ORDER — DIPHENOXYLATE-ATROPINE 2.5-0.025 MG PO TABS: 1.0000 | ORAL_TABLET | Freq: Four times a day (QID) | ORAL | 1 refills | Status: AC | PRN

## 2024-04-04 ENCOUNTER — Other Ambulatory Visit: Payer: Self-pay

## 2024-04-04 ENCOUNTER — Telehealth: Payer: Self-pay

## 2024-04-04 NOTE — Telephone Encounter (Signed)
 Faxing today smk Copied from CRM [id number]. Topic: General - Other >> Apr 01, 2024  9:54 AM Alexandria E wrote: Reason for CRM: Patient has an appointment with rheumatology on Tuesday 11/25 and that office is needing patient's labs from 10/28 faxed over to them. Attn Dr. Curt with a fax number of [phone].

## 2024-04-04 NOTE — Progress Notes (Unsigned)
 Patient is in office today for a nurse visit for {NurseVisitChoices:[id number]}. Patient {NurseVisitDetails:30447}

## 2024-05-22 ENCOUNTER — Emergency Department (HOSPITAL_COMMUNITY)
Admission: EM | Admit: 2024-05-22 | Discharge: 2024-05-22 | Disposition: A | Discharge status: Home / Self Care | Attending: Emergency Medicine | Admitting: Emergency Medicine

## 2024-05-22 ENCOUNTER — Emergency Department (HOSPITAL_COMMUNITY)

## 2024-05-22 DIAGNOSIS — U071 COVID-19: Secondary | ICD-10-CM | POA: Diagnosis not present

## 2024-05-22 DIAGNOSIS — R509 Fever, unspecified: Secondary | ICD-10-CM | POA: Diagnosis present

## 2024-05-22 DIAGNOSIS — R531 Weakness: Secondary | ICD-10-CM | POA: Insufficient documentation

## 2024-05-22 LAB — CBC WITH DIFFERENTIAL/PLATELET
Abs Immature Granulocytes: 0.03 K/uL (ref 0.00–0.07)
Basophils Absolute: 0.1 K/uL (ref 0.0–0.1)
Basophils Relative: 1 %
Eosinophils Absolute: 0.1 K/uL (ref 0.0–0.5)
Eosinophils Relative: 2 %
HCT: 36.3 % — ABNORMAL LOW (ref 39.0–52.0)
Hemoglobin: 11.7 g/dL — ABNORMAL LOW (ref 13.0–17.0)
Immature Granulocytes: 0 %
Lymphocytes Relative: 8 %
Lymphs Abs: 0.6 K/uL — ABNORMAL LOW (ref 0.7–4.0)
MCH: 28.3 pg (ref 26.0–34.0)
MCHC: 32.2 g/dL (ref 30.0–36.0)
MCV: 87.9 fL (ref 80.0–100.0)
Monocytes Absolute: 0.7 K/uL (ref 0.1–1.0)
Monocytes Relative: 11 %
Neutro Abs: 5.2 K/uL (ref 1.7–7.7)
Neutrophils Relative %: 78 %
Platelets: 266 K/uL (ref 150–400)
RBC: 4.13 MIL/uL — ABNORMAL LOW (ref 4.22–5.81)
RDW: 16 % — ABNORMAL HIGH (ref 11.5–15.5)
WBC: 6.7 K/uL (ref 4.0–10.5)
nRBC: 0 % (ref 0.0–0.2)

## 2024-05-22 LAB — COMPREHENSIVE METABOLIC PANEL WITH GFR
ALT: 13 U/L (ref 0–44)
AST: 17 U/L (ref 15–41)
Albumin: 3.8 g/dL (ref 3.5–5.0)
Alkaline Phosphatase: 74 U/L (ref 38–126)
Anion gap: 12 (ref 5–15)
BUN: 18 mg/dL (ref 8–23)
CO2: 15 mmol/L — ABNORMAL LOW (ref 22–32)
Calcium: 9.4 mg/dL (ref 8.9–10.3)
Chloride: 112 mmol/L — ABNORMAL HIGH (ref 98–111)
Creatinine, Ser: 2.78 mg/dL — ABNORMAL HIGH (ref 0.61–1.24)
GFR, Estimated: 23 mL/min — ABNORMAL LOW
Glucose, Bld: 114 mg/dL — ABNORMAL HIGH (ref 70–99)
Potassium: 3.6 mmol/L (ref 3.5–5.1)
Sodium: 139 mmol/L (ref 135–145)
Total Bilirubin: 0.3 mg/dL (ref 0.0–1.2)
Total Protein: 6.7 g/dL (ref 6.5–8.1)

## 2024-05-22 LAB — I-STAT CG4 LACTIC ACID, ED: Lactic Acid, Venous: 0.9 mmol/L (ref 0.5–1.9)

## 2024-05-22 LAB — RESP PANEL BY RT-PCR (RSV, FLU A&B, COVID)  RVPGX2
Influenza A by PCR: NEGATIVE
Influenza B by PCR: NEGATIVE
Resp Syncytial Virus by PCR: NEGATIVE
SARS Coronavirus 2 by RT PCR: POSITIVE — AB

## 2024-05-22 LAB — CBG MONITORING, ED: Glucose-Capillary: 90 mg/dL (ref 70–99)

## 2024-05-22 NOTE — ED Provider Triage Note (Signed)
 Emergency Medicine Provider Triage Evaluation Note  DIMA FERRUFINO , a 79 y.o. male  was evaluated in triage.  Pt complains of cough, general weakness, temp (tmax 101.72F). 2 falls (one last night and today at noon 2/2 weakness). Tylenol  for fever at home  Recently exposed to the crud by a friend  Review of Systems  Positive: See hpi Negative:   Physical Exam  BP 112/70   Pulse 66   Temp 98.2 F (36.8 C) (Oral)   Resp 20   SpO2 98%  Gen:   Awake, no distress   Resp:  Normal effort  MSK:   Moves extremities without difficulty  Other:  A&Ox3  Medical Decision Making  Medically screening exam initiated at 4:15 PM.  Appropriate orders placed.  JOESIAH LONON was informed that the remainder of the evaluation will be completed by another provider, this initial triage assessment does not replace that evaluation, and the importance of remaining in the ED until their evaluation is complete.  Labx, CXR ordered   Minnie Tinnie BRAVO, [place] 05/22/24 [id number]

## 2024-05-22 NOTE — ED Triage Notes (Addendum)
 Patient reports fever last night, congestion/cough, weakness, has fallen once last night and once today, patient reports he hit his head with no LOC, denies thinners. No midline tenderness. Has been around sick friends/relatives. Patient is alert and oriented x 4. Airway patent, respirations even and unlabored.

## 2024-05-22 NOTE — Discharge Instructions (Signed)
 Take Tylenol  or Motrin for pain and follow-up with your doctor if any problems.  Drink plenty of fluids

## 2024-05-22 NOTE — ED Provider Notes (Signed)
 " [facility] Provider Note   CSN: [id number] Arrival date & time: 05/22/24  1457     Patient presents with: Fever   Herbert Morrison is a 79 y.o. male.  {Add pertinent medical, surgical, social history, OB history to YEP:67052} Patient complains of weakness and fever and he has fallen a couple times.   Fever      Prior to Admission medications  Medication Sig Start Date End Date Taking? Authorizing Provider  acetaminophen  (TYLENOL ) 500 MG tablet Take 1,000 mg by mouth every 6 (six) hours as needed for mild pain (pain score 1-3).    [provider]  amLODipine  (NORVASC ) 10 MG tablet Take 1 tablet every day by oral route.    [provider]  buPROPion  (WELLBUTRIN  XL) 300 MG 24 hr tablet Take 1 tablet (300 mg total) by mouth in the morning. 12/07/23   Wendolyn Jenkins Jansky, MD  diphenoxylate -atropine  (LOMOTIL ) 2.5-0.025 MG tablet Take 1-2 tablets by mouth 4 (four) times daily as needed for diarrhea or loose stools. 03/28/24   Stacia Glendia BRAVO, MD  diphenoxylate -atropine  (LOMOTIL ) 2.5-0.025 MG tablet TAKE 1-2 CAPS BY MOUTH FOUR TIMES A DAY AS NEEDED FOR LOOSE STOOLS OR DIARRHEA Patient taking differently: 2 tablets four times a day 01/22/24   Stacia Glendia BRAVO, MD  escitalopram  (LEXAPRO ) 20 MG tablet Take 1 tablet (20 mg total) by mouth in the morning. 12/31/23   Wendolyn Jenkins Jansky, MD  Ferrous Sulfate  (IRON PO) Take by mouth. 625 mg daily    [provider]  fluticasone  (FLONASE ) 50 MCG/ACT nasal spray Place 2 sprays into both nostrils as needed for allergies or rhinitis.    [provider]  hydrALAZINE  (APRESOLINE ) 100 MG tablet Take 100 mg by mouth 3 (three) times daily.    [provider]  HYDROcodone -acetaminophen  (NORCO) 10-325 MG tablet Take 1 tablet by mouth every 6 (six) hours as needed for moderate pain (pain score 4-6). 03/08/24   Wendolyn Jenkins Jansky, MD  hydroxychloroquine  (PLAQUENIL ) 200 MG  tablet Take 200 mg by mouth 2 (two) times daily.    [provider]  ketoconazole  (NIZORAL ) 2 % shampoo Apply 1 Application topically every other day. Every other shower 12/26/23   Johnson, Clanford L, MD  levocetirizine (XYZAL) 5 MG tablet Take 10 mg by mouth daily.    [provider]  loperamide  (IMODIUM ) 2 MG capsule two capsules initially, then 2 mg after each subsquent loose stool not to exceed 16 mg per day 12/26/23   Vicci Afton CROME, MD  LORazepam  (ATIVAN ) 2 MG tablet Take 1 tablet (2 mg total) by mouth in the morning and at bedtime. 03/08/24   Wendolyn Jenkins Jansky, MD  meclizine  (ANTIVERT ) 25 MG tablet Take 25 mg by mouth 3 (three) times daily as needed (vertigo).    [provider]  methocarbamol  (ROBAXIN ) 500 MG tablet Take 1 tablet (500 mg total) by mouth every 8 (eight) hours as needed for muscle spasms. 10/30/23   Wendolyn Jenkins Jansky, MD  metoCLOPramide  (REGLAN ) 5 MG tablet Take 5 mg by mouth. As needed    [provider]  MULTIPLE VITAMIN PO Take by mouth. Mens formula one daily    [provider]  ondansetron  (ZOFRAN ) 4 MG tablet Take 4 mg by mouth every 8 (eight) hours as needed for vomiting or nausea.    [provider]  OVER THE COUNTER MEDICATION Fiber tablets daily    [provider]  OVER  THE COUNTER MEDICATION Imodium  mixed with gas x : 2 in the AM and 1 after each loose stool    [provider]  Probiotic Product (PROBIOTIC ADVANCED PO) Take 1 capsule by mouth daily.    [provider]  rifaximin  (XIFAXAN ) 550 MG TABS tablet Take 1 tablet (550 mg total) by mouth 3 (three) times daily. 02/29/24   Stacia Glendia BRAVO, MD  SIMETHICONE  PO Take 1 tablet by mouth in the morning, at noon, and at bedtime.    [provider]  Zinc  50 MG TABS Take by mouth. One daily    [provider]    Allergies: Other, Remicade [infliximab], Sulfa antibiotics, Erythromycin, Mesalamine , Pollen extract,  Sulfasalazine, and Grass pollen([id number] vern)    Review of Systems  Constitutional:  Positive for fever.    Updated Vital Signs BP 134/68   Pulse 62   Temp 98.2 F (36.8 C) (Oral)   Resp 15   SpO2 97%   Physical Exam  (all labs ordered are listed, but only abnormal results are displayed) Labs Reviewed  RESP PANEL BY RT-PCR (RSV, FLU A&B, COVID)  RVPGX2 - Abnormal; Notable for the following components:      Result Value   SARS Coronavirus 2 by RT PCR POSITIVE (*)    All other components within normal limits  CBC WITH DIFFERENTIAL/PLATELET - Abnormal; Notable for the following components:   RBC 4.13 (*)    Hemoglobin 11.7 (*)    HCT 36.3 (*)    RDW 16.0 (*)    Lymphs Abs 0.6 (*)    All other components within normal limits  COMPREHENSIVE METABOLIC PANEL WITH GFR - Abnormal; Notable for the following components:   Chloride 112 (*)    CO2 15 (*)    Glucose, Bld 114 (*)    Creatinine, Ser 2.78 (*)    GFR, Estimated 23 (*)    All other components within normal limits  CBG MONITORING, ED  I-STAT CG4 LACTIC ACID, ED    EKG: None  Radiology: CT Head Wo Contrast Result Date: 05/22/2024 EXAM: CT HEAD WITHOUT CONTRAST 05/22/2024 06:27:26 PM TECHNIQUE: CT of the head was performed without the administration of intravenous contrast. Automated exposure control, iterative reconstruction, and/or weight based adjustment of the mA/kV was utilized to reduce the radiation dose to as low as reasonably achievable. COMPARISON: Head CT 12/23/2023. CLINICAL HISTORY: Head trauma, intracranial arterial injury suspected. FINDINGS: BRAIN AND VENTRICLES: There is no evidence of an acute infarct, intracranial hemorrhage, mass, midline shift, hydrocephalus, or extra-axial fluid collection. Mild cerebral atrophy is within normal limits for age. Cerebral white matter hypodensities are similar to the prior CT and nonspecific but compatible with mild chronic small vessel ischemic disease. Calcified  atherosclerosis at the skull base. ORBITS: Bilateral cataract extraction. SINUSES: Mild mucosal thickening in the paranasal sinuses. Clear mastoid air cells. SOFT TISSUES AND SKULL: No acute soft tissue abnormality. No skull fracture. IMPRESSION: 1. No acute intracranial abnormality. Electronically signed by: Dasie Hamburg MD MD 05/22/2024 06:34 PM EST RP Workstation: [id number]   DG Chest 2 View Result Date: 05/22/2024 CLINICAL DATA:  Fever with weakness, cough and congestion. EXAM: CHEST - 2 VIEW COMPARISON:  May 15, 2023 FINDINGS: The cardiac silhouette is mildly enlarged and unchanged in size. A very small, stable left pleural effusion is seen. No acute infiltrate or pneumothorax is identified. Multilevel degenerative changes are seen throughout the thoracic spine. IMPRESSION: Very small, stable left pleural effusion. Electronically Signed   By: Suzen Dials  M.D.   On: 05/22/2024 16:51    {Document cardiac monitor, telemetry assessment procedure when appropriate:32947} Procedures   Medications Ordered in the ED - No data to display    {Click here for ABCD2, HEART and other calculators REFRESH Note before signing:1}                              Medical Decision Making Amount and/or Complexity of Data Reviewed Radiology: ordered.   Patient with COVID.  Patient improved with fluids and will follow-up as needed  {Document critical care time when appropriate  Document review of labs and clinical decision tools ie CHADS2VASC2, etc  Document your independent review of radiology images and any outside records  Document your discussion with family members, caretakers and with consultants  Document social determinants of health affecting pt's care  Document your decision making why or why not admission, treatments were needed:32947:::1}   Final diagnoses:  COVID-19    ED Discharge Orders     None        "

## 2024-05-23 ENCOUNTER — Telehealth: Payer: Self-pay | Admitting: Family Medicine

## 2024-05-23 NOTE — Telephone Encounter (Signed)
 Called pt he states he is doing fine just a little cough and some congestion.

## 2024-05-23 NOTE — Telephone Encounter (Signed)
 Pt was seen on 05/22/24 at [facility]  Patient Name First: Herbert Last: Morrison Gender: Male DOB: 12/02/1945 Age: 79 Y 10 M 23 D Return Phone Number: [phone] (Primary) Address: City/ State/ Zip: [place] [address] Client [facility] [employer] Provider Wendolyn, Jenkins Contact Type Call Who Is Calling Patient / Member / Family / Caregiver Call Type Triage / Clinical Caller Name Bain Whichard Caller Phone Number [phone] Relationship To Patient Spouse Return Phone Number [phone] (Primary) Chief Complaint Muscle Jerks, Tics And Shudders Reason for Call Symptomatic / Request for Health Information Initial Comment Caller states her husband has a temp of 101.3. She states he keeps falling. No injured. However, she is concerned. Really weak and Shakey. Translation No Nurse Assessment Nurse: Charlene, RN, Shery Date/Time (Eastern Time): 05/22/2024 1:04:02 PM Confirm and document reason for call. If symptomatic, describe symptoms. ---Caller states her husband has been weak and shaky for several days. States 2 days ago he seemed better but as of yesterday he ended up with a cough and congestion and states last night he fell on the floor and again an hr ago ago after attempting to stand from the toilet. Temp 101.3 oral. Does the patient have any new or worsening symptoms? ---Yes Will a triage be completed? ---Yes Related visit to physician within the last 2 weeks? ---No Does the PT have any chronic conditions? (i.e. diabetes, asthma, this includes High risk factors for pregnancy, etc.) ---Yes List chronic conditions. ---HTN Lupus ulcerative colitis Rheumatoid arthritis Is this a behavioral health or substance abuse call? ---No Guidelines Guideline Title Affirmed Question Affirmed Notes Nurse Date/Time Titus Time) Head Injury [1] ACUTE NEURO SYMPTOM AND Charlene, RN, Shery  05/22/2024 1:09:17 PM  Guidelines Guideline Title Affirmed Question Affirmed Notes Nurse Date/Time (Eastern Time) [2] present now (Definition: Difficult to awaken OR confused thinking and talking OR slurred speech OR weakness of arms or legs OR unsteady walking.) Disp. Time Titus Time) Disposition Final User 05/22/2024 1:11:19 PM Call EMS 911 Now Yes Charlene, RN, Shery 05/22/2024 1:13:13 PM 911 Outcome Documentation Charlene, RN, Shery Reason: Caller states she will drive to ED instead of calling EMS 911. Final Disposition 05/22/2024 1:11:19 PM Call EMS 911 Now Yes Charlene, RN, Shery Caller Disagree/Comply Comply Caller Understands Yes PreDisposition InappropriateToAsk Care Advice Given Per Guideline CALL EMS 911 NOW: * Immediate medical attention is needed. You need to hang up and call 911 (or an ambulance). * Triager Discretion: I'll call you back in a few minutes to be sure you were able to reach them. CARE ADVICE given per Head Injury (Adult) guideline. Comments User: Shery Charlene, RN Date/Time Titus Time): 05/22/2024 1:12:48 PM caller verifies that he hit his head during the fall and is also have a little slurred speech. Referrals [facility] - ED

## 2024-06-09 ENCOUNTER — Other Ambulatory Visit: Payer: Self-pay | Admitting: Gastroenterology

## 2024-06-09 ENCOUNTER — Telehealth: Payer: Self-pay | Admitting: Gastroenterology

## 2024-06-09 NOTE — Telephone Encounter (Signed)
 Inbound call from patient's wife requesting a refill for lomotil  tablets before the snow this weekend. Please advise, thank you

## 2024-06-09 NOTE — Telephone Encounter (Signed)
Faxed prescription to patient's pharmacy

## 2024-06-10 ENCOUNTER — Ambulatory Visit: Admitting: Family Medicine

## 2024-07-01 ENCOUNTER — Ambulatory Visit: Admitting: Family Medicine

## 2024-07-01 ENCOUNTER — Ambulatory Visit: Admitting: Gastroenterology

## 2025-01-24 ENCOUNTER — Ambulatory Visit
